# Patient Record
Sex: Female | Born: 1935 | Race: Black or African American | Hispanic: No | Smoking: Former smoker
Health system: Southern US, Community
[De-identification: ages and names within clinical notes are randomized; demographics above are authoritative.]

## PROBLEM LIST (undated history)

## (undated) DIAGNOSIS — IMO0001 Reserved for inherently not codable concepts without codable children: Secondary | ICD-10-CM

## (undated) DIAGNOSIS — Z9289 Personal history of other medical treatment: Secondary | ICD-10-CM

## (undated) DIAGNOSIS — I4821 Permanent atrial fibrillation: Secondary | ICD-10-CM

## (undated) DIAGNOSIS — I272 Pulmonary hypertension, unspecified: Secondary | ICD-10-CM

## (undated) DIAGNOSIS — I4891 Unspecified atrial fibrillation: Secondary | ICD-10-CM

## (undated) DIAGNOSIS — M1990 Unspecified osteoarthritis, unspecified site: Secondary | ICD-10-CM

## (undated) DIAGNOSIS — R001 Bradycardia, unspecified: Secondary | ICD-10-CM

## (undated) DIAGNOSIS — I10 Essential (primary) hypertension: Secondary | ICD-10-CM

## (undated) DIAGNOSIS — Z531 Procedure and treatment not carried out because of patient's decision for reasons of belief and group pressure: Secondary | ICD-10-CM

## (undated) DIAGNOSIS — E669 Obesity, unspecified: Secondary | ICD-10-CM

## (undated) DIAGNOSIS — K219 Gastro-esophageal reflux disease without esophagitis: Secondary | ICD-10-CM

## (undated) DIAGNOSIS — I712 Thoracic aortic aneurysm, without rupture: Secondary | ICD-10-CM

## (undated) DIAGNOSIS — Z7901 Long term (current) use of anticoagulants: Secondary | ICD-10-CM

## (undated) DIAGNOSIS — I5032 Chronic diastolic (congestive) heart failure: Secondary | ICD-10-CM

## (undated) DIAGNOSIS — I7121 Aneurysm of the ascending aorta, without rupture: Secondary | ICD-10-CM

## (undated) HISTORY — PX: KNEE ARTHROSCOPY: SUR90

## (undated) HISTORY — DX: Personal history of other medical treatment: Z92.89

## (undated) HISTORY — PX: ABDOMINAL HYSTERECTOMY: SHX81

## (undated) HISTORY — PX: CHOLECYSTECTOMY: SHX55

## (undated) HISTORY — DX: Reserved for inherently not codable concepts without codable children: IMO0001

## (undated) HISTORY — DX: Obesity, unspecified: E66.9

## (undated) HISTORY — DX: Long term (current) use of anticoagulants: Z79.01

## (undated) HISTORY — DX: Aneurysm of the ascending aorta, without rupture: I71.21

## (undated) HISTORY — DX: Bradycardia, unspecified: R00.1

## (undated) HISTORY — DX: Gastro-esophageal reflux disease without esophagitis: K21.9

## (undated) HISTORY — DX: Procedure and treatment not carried out because of patient's decision for reasons of belief and group pressure: Z53.1

## (undated) HISTORY — DX: Pulmonary hypertension, unspecified: I27.20

## (undated) HISTORY — PX: TUBAL LIGATION: SHX77

## (undated) HISTORY — DX: Chronic diastolic (congestive) heart failure: I50.32

## (undated) HISTORY — DX: Thoracic aortic aneurysm, without rupture: I71.2

## (undated) HISTORY — DX: Permanent atrial fibrillation: I48.21

## (undated) HISTORY — DX: Unspecified osteoarthritis, unspecified site: M19.90

## (undated) HISTORY — PX: CARPAL TUNNEL RELEASE: SHX101

---

## 1978-01-14 HISTORY — PX: IRRIGATION AND DEBRIDEMENT SEBACEOUS CYST: SHX5255

## 1996-06-14 ENCOUNTER — Encounter (INDEPENDENT_AMBULATORY_CARE_PROVIDER_SITE_OTHER): Payer: Self-pay | Admitting: *Deleted

## 1997-04-26 ENCOUNTER — Encounter: Admission: RE | Admit: 1997-04-26 | Discharge: 1997-04-26 | Payer: Self-pay | Admitting: Family Medicine

## 1997-05-02 ENCOUNTER — Encounter: Admission: RE | Admit: 1997-05-02 | Discharge: 1997-05-02 | Payer: Self-pay | Admitting: Family Medicine

## 1997-07-11 ENCOUNTER — Ambulatory Visit (HOSPITAL_COMMUNITY): Admission: RE | Admit: 1997-07-11 | Discharge: 1997-07-11 | Payer: Self-pay | Admitting: Gastroenterology

## 1997-08-17 ENCOUNTER — Encounter: Admission: RE | Admit: 1997-08-17 | Discharge: 1997-08-17 | Payer: Self-pay | Admitting: Family Medicine

## 1997-08-30 ENCOUNTER — Encounter: Admission: RE | Admit: 1997-08-30 | Discharge: 1997-08-30 | Payer: Self-pay | Admitting: Sports Medicine

## 1997-09-07 ENCOUNTER — Encounter: Admission: RE | Admit: 1997-09-07 | Discharge: 1997-09-07 | Payer: Self-pay | Admitting: Family Medicine

## 1997-11-24 ENCOUNTER — Encounter: Admission: RE | Admit: 1997-11-24 | Discharge: 1997-11-24 | Payer: Self-pay | Admitting: Family Medicine

## 1998-03-16 ENCOUNTER — Encounter: Admission: RE | Admit: 1998-03-16 | Discharge: 1998-03-16 | Payer: Self-pay | Admitting: Family Medicine

## 1998-09-27 ENCOUNTER — Encounter: Admission: RE | Admit: 1998-09-27 | Discharge: 1998-09-27 | Payer: Self-pay | Admitting: Family Medicine

## 1998-11-21 ENCOUNTER — Encounter: Admission: RE | Admit: 1998-11-21 | Discharge: 1998-11-21 | Payer: Self-pay | Admitting: Sports Medicine

## 1998-11-21 ENCOUNTER — Encounter: Payer: Self-pay | Admitting: Sports Medicine

## 1998-12-22 ENCOUNTER — Encounter: Admission: RE | Admit: 1998-12-22 | Discharge: 1998-12-22 | Payer: Self-pay | Admitting: Family Medicine

## 1999-01-04 ENCOUNTER — Encounter: Admission: RE | Admit: 1999-01-04 | Discharge: 1999-01-04 | Payer: Self-pay | Admitting: Sports Medicine

## 1999-06-11 ENCOUNTER — Encounter: Admission: RE | Admit: 1999-06-11 | Discharge: 1999-06-11 | Payer: Self-pay

## 1999-07-04 ENCOUNTER — Encounter: Admission: RE | Admit: 1999-07-04 | Discharge: 1999-07-04 | Payer: Self-pay | Admitting: Family Medicine

## 1999-08-20 ENCOUNTER — Encounter: Admission: RE | Admit: 1999-08-20 | Discharge: 1999-08-20 | Payer: Self-pay | Admitting: Family Medicine

## 1999-08-27 ENCOUNTER — Encounter (INDEPENDENT_AMBULATORY_CARE_PROVIDER_SITE_OTHER): Payer: Self-pay | Admitting: *Deleted

## 1999-08-27 ENCOUNTER — Ambulatory Visit (HOSPITAL_BASED_OUTPATIENT_CLINIC_OR_DEPARTMENT_OTHER): Admission: RE | Admit: 1999-08-27 | Discharge: 1999-08-27 | Payer: Self-pay | Admitting: Orthopedic Surgery

## 1999-11-15 ENCOUNTER — Encounter: Admission: RE | Admit: 1999-11-15 | Discharge: 1999-11-15 | Payer: Self-pay | Admitting: Family Medicine

## 1999-12-04 ENCOUNTER — Encounter: Admission: RE | Admit: 1999-12-04 | Discharge: 1999-12-04 | Payer: Self-pay | Admitting: *Deleted

## 1999-12-04 ENCOUNTER — Encounter: Payer: Self-pay | Admitting: Family Medicine

## 1999-12-18 ENCOUNTER — Encounter: Admission: RE | Admit: 1999-12-18 | Discharge: 2000-03-17 | Payer: Self-pay | Admitting: *Deleted

## 2000-01-22 ENCOUNTER — Encounter: Admission: RE | Admit: 2000-01-22 | Discharge: 2000-01-22 | Payer: Self-pay | Admitting: Family Medicine

## 2000-02-28 ENCOUNTER — Encounter: Admission: RE | Admit: 2000-02-28 | Discharge: 2000-02-28 | Payer: Self-pay | Admitting: Family Medicine

## 2000-02-28 ENCOUNTER — Ambulatory Visit (HOSPITAL_COMMUNITY): Admission: RE | Admit: 2000-02-28 | Discharge: 2000-02-28 | Payer: Self-pay | Admitting: Family Medicine

## 2000-03-03 ENCOUNTER — Encounter: Admission: RE | Admit: 2000-03-03 | Discharge: 2000-03-03 | Payer: Self-pay | Admitting: Family Medicine

## 2000-04-23 ENCOUNTER — Encounter: Admission: RE | Admit: 2000-04-23 | Discharge: 2000-04-23 | Payer: Self-pay | Admitting: Family Medicine

## 2000-05-14 ENCOUNTER — Encounter: Admission: RE | Admit: 2000-05-14 | Discharge: 2000-05-14 | Payer: Self-pay | Admitting: Cardiology

## 2000-05-14 ENCOUNTER — Encounter: Payer: Self-pay | Admitting: Cardiology

## 2000-05-30 ENCOUNTER — Ambulatory Visit (HOSPITAL_COMMUNITY): Admission: RE | Admit: 2000-05-30 | Discharge: 2000-05-30 | Payer: Self-pay | Admitting: Family Medicine

## 2000-05-30 ENCOUNTER — Encounter: Admission: RE | Admit: 2000-05-30 | Discharge: 2000-05-30 | Payer: Self-pay | Admitting: Family Medicine

## 2000-06-11 ENCOUNTER — Encounter: Admission: RE | Admit: 2000-06-11 | Discharge: 2000-06-11 | Payer: Self-pay | Admitting: Family Medicine

## 2000-06-25 ENCOUNTER — Encounter: Admission: RE | Admit: 2000-06-25 | Discharge: 2000-06-25 | Payer: Self-pay | Admitting: Family Medicine

## 2000-07-01 ENCOUNTER — Encounter: Admission: RE | Admit: 2000-07-01 | Discharge: 2000-07-01 | Payer: Self-pay | Admitting: Family Medicine

## 2000-07-10 ENCOUNTER — Encounter: Admission: RE | Admit: 2000-07-10 | Discharge: 2000-07-10 | Payer: Self-pay | Admitting: Family Medicine

## 2000-08-11 ENCOUNTER — Encounter: Admission: RE | Admit: 2000-08-11 | Discharge: 2000-08-11 | Payer: Self-pay | Admitting: Family Medicine

## 2000-09-09 ENCOUNTER — Encounter: Admission: RE | Admit: 2000-09-09 | Discharge: 2000-09-09 | Payer: Self-pay | Admitting: Family Medicine

## 2000-10-09 ENCOUNTER — Encounter: Admission: RE | Admit: 2000-10-09 | Discharge: 2000-10-09 | Payer: Self-pay | Admitting: Family Medicine

## 2000-10-09 ENCOUNTER — Encounter: Admission: RE | Admit: 2000-10-09 | Discharge: 2000-10-09 | Payer: Self-pay | Admitting: Sports Medicine

## 2000-11-10 ENCOUNTER — Encounter: Admission: RE | Admit: 2000-11-10 | Discharge: 2000-11-10 | Payer: Self-pay | Admitting: Family Medicine

## 2000-12-04 ENCOUNTER — Encounter: Admission: RE | Admit: 2000-12-04 | Discharge: 2000-12-04 | Payer: Self-pay | Admitting: Sports Medicine

## 2000-12-04 ENCOUNTER — Encounter: Payer: Self-pay | Admitting: Sports Medicine

## 2000-12-10 ENCOUNTER — Encounter: Admission: RE | Admit: 2000-12-10 | Discharge: 2000-12-10 | Payer: Self-pay | Admitting: Family Medicine

## 2000-12-24 ENCOUNTER — Encounter: Admission: RE | Admit: 2000-12-24 | Discharge: 2000-12-24 | Payer: Self-pay | Admitting: Family Medicine

## 2001-02-24 ENCOUNTER — Encounter: Admission: RE | Admit: 2001-02-24 | Discharge: 2001-02-24 | Payer: Self-pay | Admitting: Sports Medicine

## 2001-05-25 ENCOUNTER — Encounter: Admission: RE | Admit: 2001-05-25 | Discharge: 2001-05-25 | Payer: Self-pay | Admitting: Family Medicine

## 2001-06-25 ENCOUNTER — Encounter: Admission: RE | Admit: 2001-06-25 | Discharge: 2001-06-25 | Payer: Self-pay | Admitting: Family Medicine

## 2001-09-17 ENCOUNTER — Encounter: Admission: RE | Admit: 2001-09-17 | Discharge: 2001-09-17 | Payer: Self-pay | Admitting: Family Medicine

## 2001-09-28 ENCOUNTER — Encounter: Admission: RE | Admit: 2001-09-28 | Discharge: 2001-09-28 | Payer: Self-pay | Admitting: Family Medicine

## 2001-10-13 ENCOUNTER — Encounter: Admission: RE | Admit: 2001-10-13 | Discharge: 2001-10-13 | Payer: Self-pay | Admitting: Family Medicine

## 2001-10-30 ENCOUNTER — Encounter: Admission: RE | Admit: 2001-10-30 | Discharge: 2001-10-30 | Payer: Self-pay | Admitting: *Deleted

## 2001-11-12 ENCOUNTER — Encounter: Admission: RE | Admit: 2001-11-12 | Discharge: 2001-11-12 | Payer: Self-pay | Admitting: Family Medicine

## 2001-12-07 ENCOUNTER — Ambulatory Visit (HOSPITAL_BASED_OUTPATIENT_CLINIC_OR_DEPARTMENT_OTHER): Admission: RE | Admit: 2001-12-07 | Discharge: 2001-12-07 | Payer: Self-pay | Admitting: Orthopedic Surgery

## 2002-01-12 ENCOUNTER — Encounter: Payer: Self-pay | Admitting: Sports Medicine

## 2002-01-12 ENCOUNTER — Encounter: Admission: RE | Admit: 2002-01-12 | Discharge: 2002-01-12 | Payer: Self-pay | Admitting: Sports Medicine

## 2002-01-28 ENCOUNTER — Encounter: Admission: RE | Admit: 2002-01-28 | Discharge: 2002-01-28 | Payer: Self-pay | Admitting: Family Medicine

## 2002-03-10 ENCOUNTER — Encounter: Admission: RE | Admit: 2002-03-10 | Discharge: 2002-03-10 | Payer: Self-pay | Admitting: Family Medicine

## 2002-05-24 ENCOUNTER — Encounter: Admission: RE | Admit: 2002-05-24 | Discharge: 2002-05-24 | Payer: Self-pay | Admitting: Family Medicine

## 2002-06-28 ENCOUNTER — Encounter: Admission: RE | Admit: 2002-06-28 | Discharge: 2002-06-28 | Payer: Self-pay | Admitting: Family Medicine

## 2002-07-14 ENCOUNTER — Encounter (INDEPENDENT_AMBULATORY_CARE_PROVIDER_SITE_OTHER): Payer: Self-pay | Admitting: Specialist

## 2002-07-14 ENCOUNTER — Ambulatory Visit (HOSPITAL_COMMUNITY): Admission: RE | Admit: 2002-07-14 | Discharge: 2002-07-14 | Payer: Self-pay | Admitting: Gastroenterology

## 2002-07-20 ENCOUNTER — Ambulatory Visit (HOSPITAL_COMMUNITY): Admission: RE | Admit: 2002-07-20 | Discharge: 2002-07-20 | Payer: Self-pay | Admitting: Family Medicine

## 2002-07-20 ENCOUNTER — Encounter (INDEPENDENT_AMBULATORY_CARE_PROVIDER_SITE_OTHER): Payer: Self-pay | Admitting: Cardiology

## 2002-08-09 ENCOUNTER — Encounter: Admission: RE | Admit: 2002-08-09 | Discharge: 2002-08-09 | Payer: Self-pay | Admitting: Family Medicine

## 2002-09-23 ENCOUNTER — Encounter: Admission: RE | Admit: 2002-09-23 | Discharge: 2002-09-23 | Payer: Self-pay | Admitting: Sports Medicine

## 2002-10-06 ENCOUNTER — Encounter: Admission: RE | Admit: 2002-10-06 | Discharge: 2002-12-15 | Payer: Self-pay

## 2002-11-03 ENCOUNTER — Encounter: Admission: RE | Admit: 2002-11-03 | Discharge: 2002-11-03 | Payer: Self-pay | Admitting: Family Medicine

## 2002-11-30 ENCOUNTER — Encounter: Admission: RE | Admit: 2002-11-30 | Discharge: 2002-11-30 | Payer: Self-pay | Admitting: Family Medicine

## 2003-02-11 ENCOUNTER — Ambulatory Visit (HOSPITAL_COMMUNITY): Admission: RE | Admit: 2003-02-11 | Discharge: 2003-02-11 | Payer: Self-pay | Admitting: Sports Medicine

## 2003-03-04 ENCOUNTER — Encounter: Admission: RE | Admit: 2003-03-04 | Discharge: 2003-03-04 | Payer: Self-pay | Admitting: Sports Medicine

## 2003-03-04 ENCOUNTER — Encounter: Admission: RE | Admit: 2003-03-04 | Discharge: 2003-03-04 | Payer: Self-pay | Admitting: Family Medicine

## 2003-03-09 ENCOUNTER — Encounter: Admission: RE | Admit: 2003-03-09 | Discharge: 2003-03-09 | Payer: Self-pay | Admitting: Family Medicine

## 2003-04-01 ENCOUNTER — Encounter: Admission: RE | Admit: 2003-04-01 | Discharge: 2003-04-01 | Payer: Self-pay | Admitting: Sports Medicine

## 2003-05-20 ENCOUNTER — Encounter: Admission: RE | Admit: 2003-05-20 | Discharge: 2003-05-20 | Payer: Self-pay | Admitting: Family Medicine

## 2003-06-28 ENCOUNTER — Encounter: Admission: RE | Admit: 2003-06-28 | Discharge: 2003-06-28 | Payer: Self-pay | Admitting: Family Medicine

## 2003-08-29 ENCOUNTER — Encounter: Admission: RE | Admit: 2003-08-29 | Discharge: 2003-08-29 | Payer: Self-pay | Admitting: Family Medicine

## 2003-09-29 ENCOUNTER — Ambulatory Visit: Payer: Self-pay | Admitting: Family Medicine

## 2003-10-17 ENCOUNTER — Ambulatory Visit: Payer: Self-pay | Admitting: Family Medicine

## 2003-11-22 ENCOUNTER — Ambulatory Visit: Payer: Self-pay | Admitting: Family Medicine

## 2003-11-22 ENCOUNTER — Ambulatory Visit: Payer: Self-pay | Admitting: Cardiology

## 2003-12-06 ENCOUNTER — Ambulatory Visit: Payer: Self-pay | Admitting: Internal Medicine

## 2003-12-20 ENCOUNTER — Ambulatory Visit: Payer: Self-pay | Admitting: Cardiology

## 2004-01-10 ENCOUNTER — Ambulatory Visit: Payer: Self-pay | Admitting: Cardiology

## 2004-02-01 ENCOUNTER — Ambulatory Visit: Payer: Self-pay | Admitting: Family Medicine

## 2004-02-07 ENCOUNTER — Ambulatory Visit: Payer: Self-pay | Admitting: Cardiology

## 2004-02-13 ENCOUNTER — Ambulatory Visit: Payer: Self-pay | Admitting: Sports Medicine

## 2004-02-17 ENCOUNTER — Encounter: Admission: RE | Admit: 2004-02-17 | Discharge: 2004-02-17 | Payer: Self-pay | Admitting: Sports Medicine

## 2004-02-21 ENCOUNTER — Ambulatory Visit: Payer: Self-pay | Admitting: Cardiology

## 2004-03-05 ENCOUNTER — Ambulatory Visit: Payer: Self-pay | Admitting: *Deleted

## 2004-03-05 ENCOUNTER — Encounter: Admission: RE | Admit: 2004-03-05 | Discharge: 2004-03-05 | Payer: Self-pay | Admitting: Sports Medicine

## 2004-03-05 ENCOUNTER — Ambulatory Visit: Payer: Self-pay | Admitting: Family Medicine

## 2004-03-26 ENCOUNTER — Ambulatory Visit: Payer: Self-pay | Admitting: Cardiology

## 2004-04-09 ENCOUNTER — Ambulatory Visit: Payer: Self-pay | Admitting: Cardiology

## 2004-04-30 ENCOUNTER — Ambulatory Visit: Payer: Self-pay | Admitting: *Deleted

## 2004-05-17 ENCOUNTER — Ambulatory Visit: Payer: Self-pay | Admitting: Family Medicine

## 2004-05-28 ENCOUNTER — Ambulatory Visit: Payer: Self-pay | Admitting: Cardiology

## 2004-06-08 ENCOUNTER — Ambulatory Visit: Payer: Self-pay | Admitting: Cardiology

## 2004-07-02 ENCOUNTER — Ambulatory Visit: Payer: Self-pay | Admitting: Cardiology

## 2004-07-10 ENCOUNTER — Ambulatory Visit: Payer: Self-pay | Admitting: Sports Medicine

## 2004-07-16 ENCOUNTER — Ambulatory Visit: Payer: Self-pay | Admitting: Cardiology

## 2004-08-06 ENCOUNTER — Ambulatory Visit: Payer: Self-pay | Admitting: Internal Medicine

## 2004-08-07 ENCOUNTER — Ambulatory Visit: Payer: Self-pay | Admitting: Sports Medicine

## 2004-09-03 ENCOUNTER — Ambulatory Visit: Payer: Self-pay | Admitting: Cardiology

## 2004-09-18 ENCOUNTER — Ambulatory Visit: Payer: Self-pay | Admitting: Cardiology

## 2004-09-27 ENCOUNTER — Ambulatory Visit: Payer: Self-pay | Admitting: Internal Medicine

## 2004-09-27 ENCOUNTER — Ambulatory Visit: Payer: Self-pay | Admitting: Cardiology

## 2004-10-16 ENCOUNTER — Ambulatory Visit: Payer: Self-pay | Admitting: Family Medicine

## 2004-10-19 ENCOUNTER — Ambulatory Visit: Payer: Self-pay | Admitting: Cardiology

## 2004-11-02 ENCOUNTER — Ambulatory Visit: Payer: Self-pay | Admitting: Cardiology

## 2004-11-12 ENCOUNTER — Ambulatory Visit: Payer: Self-pay | Admitting: Sports Medicine

## 2004-11-23 ENCOUNTER — Ambulatory Visit: Payer: Self-pay | Admitting: Cardiology

## 2004-12-03 ENCOUNTER — Ambulatory Visit: Payer: Self-pay | Admitting: Family Medicine

## 2004-12-14 ENCOUNTER — Ambulatory Visit: Payer: Self-pay | Admitting: Internal Medicine

## 2004-12-24 ENCOUNTER — Ambulatory Visit: Payer: Self-pay | Admitting: Family Medicine

## 2005-01-11 ENCOUNTER — Ambulatory Visit: Payer: Self-pay | Admitting: Cardiology

## 2005-01-25 ENCOUNTER — Ambulatory Visit: Payer: Self-pay | Admitting: Internal Medicine

## 2005-02-15 ENCOUNTER — Ambulatory Visit: Payer: Self-pay | Admitting: Cardiology

## 2005-02-15 ENCOUNTER — Ambulatory Visit: Payer: Self-pay | Admitting: Family Medicine

## 2005-02-27 ENCOUNTER — Ambulatory Visit: Payer: Self-pay | Admitting: Family Medicine

## 2005-03-15 ENCOUNTER — Ambulatory Visit: Payer: Self-pay | Admitting: Cardiology

## 2005-03-19 ENCOUNTER — Encounter: Admission: RE | Admit: 2005-03-19 | Discharge: 2005-03-19 | Payer: Self-pay | Admitting: Sports Medicine

## 2005-04-12 ENCOUNTER — Ambulatory Visit: Payer: Self-pay | Admitting: Cardiology

## 2005-04-26 ENCOUNTER — Ambulatory Visit: Payer: Self-pay | Admitting: Family Medicine

## 2005-05-10 ENCOUNTER — Ambulatory Visit: Payer: Self-pay | Admitting: Cardiology

## 2005-06-07 ENCOUNTER — Ambulatory Visit: Payer: Self-pay | Admitting: Cardiology

## 2005-06-24 ENCOUNTER — Ambulatory Visit: Payer: Self-pay | Admitting: Family Medicine

## 2005-06-24 ENCOUNTER — Encounter: Admission: RE | Admit: 2005-06-24 | Discharge: 2005-06-24 | Payer: Self-pay | Admitting: Family Medicine

## 2005-07-05 ENCOUNTER — Ambulatory Visit: Payer: Self-pay | Admitting: Cardiology

## 2005-07-25 ENCOUNTER — Ambulatory Visit: Payer: Self-pay | Admitting: Family Medicine

## 2005-08-02 ENCOUNTER — Ambulatory Visit: Payer: Self-pay | Admitting: Cardiology

## 2005-08-30 ENCOUNTER — Ambulatory Visit: Payer: Self-pay | Admitting: Cardiology

## 2005-09-27 ENCOUNTER — Ambulatory Visit: Payer: Self-pay | Admitting: Cardiology

## 2005-10-18 ENCOUNTER — Ambulatory Visit: Payer: Self-pay | Admitting: Family Medicine

## 2005-10-25 ENCOUNTER — Ambulatory Visit: Payer: Self-pay | Admitting: Cardiology

## 2005-11-06 ENCOUNTER — Ambulatory Visit: Payer: Self-pay | Admitting: Cardiology

## 2005-11-15 ENCOUNTER — Ambulatory Visit: Payer: Self-pay | Admitting: Cardiology

## 2005-11-20 ENCOUNTER — Ambulatory Visit: Payer: Self-pay | Admitting: Family Medicine

## 2005-12-13 ENCOUNTER — Ambulatory Visit: Payer: Self-pay | Admitting: Cardiology

## 2005-12-20 ENCOUNTER — Ambulatory Visit: Payer: Self-pay | Admitting: Family Medicine

## 2005-12-30 ENCOUNTER — Ambulatory Visit: Payer: Self-pay | Admitting: Family Medicine

## 2006-01-10 ENCOUNTER — Ambulatory Visit: Payer: Self-pay | Admitting: Cardiology

## 2006-01-27 ENCOUNTER — Ambulatory Visit: Payer: Self-pay | Admitting: Family Medicine

## 2006-02-07 ENCOUNTER — Ambulatory Visit: Payer: Self-pay | Admitting: Cardiology

## 2006-02-10 ENCOUNTER — Ambulatory Visit: Payer: Self-pay | Admitting: Family Medicine

## 2006-02-28 ENCOUNTER — Ambulatory Visit: Payer: Self-pay | Admitting: Internal Medicine

## 2006-03-05 ENCOUNTER — Ambulatory Visit: Payer: Self-pay | Admitting: Cardiology

## 2006-03-14 ENCOUNTER — Encounter (INDEPENDENT_AMBULATORY_CARE_PROVIDER_SITE_OTHER): Payer: Self-pay | Admitting: *Deleted

## 2006-03-24 ENCOUNTER — Encounter (INDEPENDENT_AMBULATORY_CARE_PROVIDER_SITE_OTHER): Payer: Self-pay | Admitting: Family Medicine

## 2006-03-24 ENCOUNTER — Encounter: Admission: RE | Admit: 2006-03-24 | Discharge: 2006-03-24 | Payer: Self-pay | Admitting: Sports Medicine

## 2006-03-31 ENCOUNTER — Ambulatory Visit: Payer: Self-pay | Admitting: Internal Medicine

## 2006-04-23 ENCOUNTER — Ambulatory Visit: Payer: Self-pay | Admitting: Family Medicine

## 2006-04-23 DIAGNOSIS — I10 Essential (primary) hypertension: Secondary | ICD-10-CM | POA: Insufficient documentation

## 2006-04-23 LAB — CONVERTED CEMR LAB: Hgb A1c MFr Bld: 5.6 %

## 2006-04-28 ENCOUNTER — Ambulatory Visit: Payer: Self-pay | Admitting: Cardiology

## 2006-05-26 ENCOUNTER — Ambulatory Visit: Payer: Self-pay | Admitting: Internal Medicine

## 2006-06-11 ENCOUNTER — Ambulatory Visit: Payer: Self-pay | Admitting: Cardiology

## 2006-07-10 ENCOUNTER — Encounter (INDEPENDENT_AMBULATORY_CARE_PROVIDER_SITE_OTHER): Payer: Self-pay | Admitting: Family Medicine

## 2006-07-17 ENCOUNTER — Ambulatory Visit: Payer: Self-pay | Admitting: Family Medicine

## 2006-07-17 ENCOUNTER — Encounter (INDEPENDENT_AMBULATORY_CARE_PROVIDER_SITE_OTHER): Payer: Self-pay | Admitting: Family Medicine

## 2006-07-24 ENCOUNTER — Ambulatory Visit: Payer: Self-pay | Admitting: Family Medicine

## 2006-07-24 ENCOUNTER — Ambulatory Visit: Payer: Self-pay | Admitting: Internal Medicine

## 2006-07-28 LAB — CONVERTED CEMR LAB
AST: 20 units/L (ref 0–37)
Alkaline Phosphatase: 126 units/L — ABNORMAL HIGH (ref 39–117)
BUN: 11 mg/dL (ref 6–23)
CO2: 24 meq/L (ref 19–32)
Chloride: 105 meq/L (ref 96–112)
Creatinine, Ser: 0.58 mg/dL (ref 0.40–1.20)
Glucose, Bld: 104 mg/dL — ABNORMAL HIGH (ref 70–99)
HDL: 40 mg/dL (ref >39)
Potassium: 4 meq/L (ref 3.5–5.3)
Sodium: 142 meq/L (ref 135–145)
Total Bilirubin: 0.7 mg/dL (ref 0.3–1.2)

## 2006-08-05 ENCOUNTER — Encounter (INDEPENDENT_AMBULATORY_CARE_PROVIDER_SITE_OTHER): Payer: Self-pay | Admitting: Family Medicine

## 2006-08-05 ENCOUNTER — Ambulatory Visit: Payer: Self-pay

## 2006-08-11 ENCOUNTER — Emergency Department (HOSPITAL_COMMUNITY): Admission: EM | Admit: 2006-08-11 | Discharge: 2006-08-11 | Payer: Self-pay | Admitting: *Deleted

## 2006-08-21 ENCOUNTER — Ambulatory Visit: Payer: Self-pay | Admitting: Internal Medicine

## 2006-08-25 ENCOUNTER — Ambulatory Visit: Payer: Self-pay | Admitting: Sports Medicine

## 2006-09-12 ENCOUNTER — Ambulatory Visit: Payer: Self-pay | Admitting: Cardiology

## 2006-09-17 ENCOUNTER — Encounter: Payer: Self-pay | Admitting: *Deleted

## 2006-09-18 ENCOUNTER — Ambulatory Visit: Payer: Self-pay | Admitting: Cardiology

## 2006-10-08 ENCOUNTER — Ambulatory Visit: Payer: Self-pay | Admitting: Family Medicine

## 2006-10-16 ENCOUNTER — Ambulatory Visit: Payer: Self-pay | Admitting: Cardiology

## 2006-10-24 ENCOUNTER — Ambulatory Visit: Payer: Self-pay | Admitting: Cardiology

## 2006-11-06 ENCOUNTER — Ambulatory Visit: Payer: Self-pay | Admitting: Cardiovascular Disease

## 2006-11-20 ENCOUNTER — Ambulatory Visit: Payer: Self-pay | Admitting: Cardiology

## 2006-12-08 ENCOUNTER — Ambulatory Visit: Payer: Self-pay | Admitting: Family Medicine

## 2006-12-08 DIAGNOSIS — I5032 Chronic diastolic (congestive) heart failure: Secondary | ICD-10-CM | POA: Insufficient documentation

## 2006-12-08 DIAGNOSIS — I4891 Unspecified atrial fibrillation: Secondary | ICD-10-CM | POA: Insufficient documentation

## 2006-12-09 ENCOUNTER — Encounter (INDEPENDENT_AMBULATORY_CARE_PROVIDER_SITE_OTHER): Payer: Self-pay | Admitting: Family Medicine

## 2006-12-09 ENCOUNTER — Telehealth: Payer: Self-pay | Admitting: *Deleted

## 2006-12-09 ENCOUNTER — Encounter (INDEPENDENT_AMBULATORY_CARE_PROVIDER_SITE_OTHER): Payer: Self-pay | Admitting: *Deleted

## 2006-12-09 ENCOUNTER — Ambulatory Visit: Payer: Self-pay | Admitting: Family Medicine

## 2006-12-09 LAB — CONVERTED CEMR LAB
Glucose, Bld: 108 mg/dL — ABNORMAL HIGH (ref 70–99)
Hgb A1c MFr Bld: 5.8 %

## 2006-12-16 ENCOUNTER — Telehealth (INDEPENDENT_AMBULATORY_CARE_PROVIDER_SITE_OTHER): Payer: Self-pay | Admitting: Family Medicine

## 2006-12-18 ENCOUNTER — Ambulatory Visit: Payer: Self-pay | Admitting: Cardiology

## 2006-12-19 ENCOUNTER — Telehealth: Payer: Self-pay | Admitting: *Deleted

## 2006-12-25 ENCOUNTER — Encounter: Payer: Self-pay | Admitting: Family Medicine

## 2007-01-05 ENCOUNTER — Ambulatory Visit: Payer: Self-pay | Admitting: Cardiology

## 2007-02-02 ENCOUNTER — Ambulatory Visit: Payer: Self-pay | Admitting: Cardiovascular Disease

## 2007-02-27 ENCOUNTER — Ambulatory Visit: Payer: Self-pay | Admitting: Family Medicine

## 2007-03-02 ENCOUNTER — Ambulatory Visit: Payer: Self-pay | Admitting: Internal Medicine

## 2007-03-26 ENCOUNTER — Ambulatory Visit: Payer: Self-pay | Admitting: Cardiology

## 2007-03-26 ENCOUNTER — Encounter: Admission: RE | Admit: 2007-03-26 | Discharge: 2007-03-26 | Payer: Self-pay | Admitting: Family Medicine

## 2007-04-06 ENCOUNTER — Ambulatory Visit: Payer: Self-pay | Admitting: Family Medicine

## 2007-04-06 DIAGNOSIS — D126 Benign neoplasm of colon, unspecified: Secondary | ICD-10-CM | POA: Insufficient documentation

## 2007-04-23 ENCOUNTER — Ambulatory Visit: Payer: Self-pay | Admitting: Internal Medicine

## 2007-04-29 ENCOUNTER — Ambulatory Visit: Payer: Self-pay | Admitting: Family Medicine

## 2007-06-01 ENCOUNTER — Ambulatory Visit: Payer: Self-pay | Admitting: Cardiology

## 2007-06-04 ENCOUNTER — Ambulatory Visit: Payer: Self-pay | Admitting: Family Medicine

## 2007-06-04 DIAGNOSIS — F3289 Other specified depressive episodes: Secondary | ICD-10-CM | POA: Insufficient documentation

## 2007-06-04 DIAGNOSIS — F329 Major depressive disorder, single episode, unspecified: Secondary | ICD-10-CM | POA: Insufficient documentation

## 2007-06-04 DIAGNOSIS — E669 Obesity, unspecified: Secondary | ICD-10-CM | POA: Insufficient documentation

## 2007-06-29 ENCOUNTER — Ambulatory Visit: Payer: Self-pay | Admitting: Cardiology

## 2007-07-08 ENCOUNTER — Encounter: Payer: Self-pay | Admitting: Family Medicine

## 2007-07-29 ENCOUNTER — Ambulatory Visit: Payer: Self-pay | Admitting: Cardiology

## 2007-07-29 ENCOUNTER — Ambulatory Visit: Payer: Self-pay | Admitting: Cardiovascular Disease

## 2007-08-10 ENCOUNTER — Ambulatory Visit: Payer: Self-pay | Admitting: Cardiology

## 2007-08-10 LAB — CONVERTED CEMR LAB
AST: 32 units/L (ref 0–37)
Albumin: 4.1 g/dL (ref 3.5–5.2)
Alkaline Phosphatase: 97 units/L (ref 39–117)
BUN: 9 mg/dL (ref 6–23)
Creatinine, Ser: 0.7 mg/dL (ref 0.4–1.2)
GFR calc Af Amer: 105 mL/min
GFR calc non Af Amer: 87 mL/min
Glucose, Bld: 108 mg/dL — ABNORMAL HIGH (ref 70–99)
Potassium: 3.7 meq/L (ref 3.5–5.1)
Total Protein: 7 g/dL (ref 6.0–8.3)

## 2007-08-26 ENCOUNTER — Ambulatory Visit: Payer: Self-pay | Admitting: Cardiovascular Disease

## 2007-09-22 ENCOUNTER — Ambulatory Visit: Payer: Self-pay | Admitting: Cardiology

## 2007-09-23 ENCOUNTER — Ambulatory Visit: Payer: Self-pay | Admitting: Cardiology

## 2007-10-13 ENCOUNTER — Ambulatory Visit: Payer: Self-pay | Admitting: Internal Medicine

## 2007-10-28 ENCOUNTER — Ambulatory Visit: Payer: Self-pay | Admitting: Family Medicine

## 2007-10-28 DIAGNOSIS — M170 Bilateral primary osteoarthritis of knee: Secondary | ICD-10-CM | POA: Insufficient documentation

## 2007-10-28 LAB — CONVERTED CEMR LAB

## 2007-10-29 ENCOUNTER — Encounter: Payer: Self-pay | Admitting: Family Medicine

## 2007-11-12 ENCOUNTER — Ambulatory Visit: Payer: Self-pay | Admitting: Cardiovascular Disease

## 2007-12-07 ENCOUNTER — Ambulatory Visit: Payer: Self-pay | Admitting: Internal Medicine

## 2007-12-22 ENCOUNTER — Ambulatory Visit: Payer: Self-pay | Admitting: Cardiology

## 2007-12-28 ENCOUNTER — Ambulatory Visit: Payer: Self-pay | Admitting: Internal Medicine

## 2008-01-18 ENCOUNTER — Ambulatory Visit: Payer: Self-pay | Admitting: Internal Medicine

## 2008-02-12 ENCOUNTER — Ambulatory Visit: Payer: Self-pay | Admitting: Internal Medicine

## 2008-02-16 ENCOUNTER — Encounter: Payer: Self-pay | Admitting: Family Medicine

## 2008-02-16 ENCOUNTER — Ambulatory Visit: Payer: Self-pay | Admitting: Cardiology

## 2008-02-23 ENCOUNTER — Encounter: Payer: Self-pay | Admitting: Cardiology

## 2008-02-23 ENCOUNTER — Ambulatory Visit: Payer: Self-pay

## 2008-02-25 ENCOUNTER — Telehealth: Payer: Self-pay | Admitting: Family Medicine

## 2008-02-26 ENCOUNTER — Ambulatory Visit: Payer: Self-pay | Admitting: Hematology and Oncology

## 2008-03-03 ENCOUNTER — Encounter: Payer: Self-pay | Admitting: Family Medicine

## 2008-03-03 LAB — CBC WITH DIFFERENTIAL/PLATELET
Basophils Absolute: 0 10*3/uL (ref 0.0–0.1)
EOS%: 1.1 % (ref 0.0–7.0)
HCT: 42 % (ref 34.8–46.6)
HGB: 13.7 g/dL (ref 11.6–15.9)
LYMPH%: 36 % (ref 14.0–48.0)
MCH: 28.1 pg (ref 26.0–34.0)
MCV: 86.2 fL (ref 81.0–101.0)
NEUT%: 58.1 % (ref 39.6–76.8)
Platelets: 189 10*3/uL (ref 145–400)
lymph#: 2.4 10*3/uL (ref 0.9–3.3)

## 2008-03-04 ENCOUNTER — Telehealth: Payer: Self-pay | Admitting: *Deleted

## 2008-03-07 ENCOUNTER — Ambulatory Visit: Payer: Self-pay | Admitting: Family Medicine

## 2008-03-07 ENCOUNTER — Encounter: Payer: Self-pay | Admitting: Family Medicine

## 2008-03-07 LAB — CONVERTED CEMR LAB: Hgb A1c MFr Bld: 6 %

## 2008-03-09 LAB — CONVERTED CEMR LAB
BUN: 17 mg/dL (ref 6–23)
Cholesterol: 176 mg/dL (ref 0–200)
Creatinine, Ser: 0.67 mg/dL (ref 0.40–1.20)
HCT: 41.1 % (ref 36.0–46.0)
HDL: 51 mg/dL (ref >39)
Hemoglobin: 13 g/dL (ref 12.0–15.0)
MCHC: 31.6 g/dL (ref 30.0–36.0)
Platelets: 198 10*3/uL (ref 150–400)
RDW: 16.3 % — ABNORMAL HIGH (ref 11.5–15.5)
Triglycerides: 88 mg/dL (ref <150)

## 2008-03-11 ENCOUNTER — Ambulatory Visit: Payer: Self-pay | Admitting: Cardiology

## 2008-03-28 ENCOUNTER — Ambulatory Visit: Payer: Self-pay | Admitting: Cardiology

## 2008-03-28 ENCOUNTER — Encounter: Payer: Self-pay | Admitting: Cardiology

## 2008-04-04 ENCOUNTER — Ambulatory Visit: Payer: Self-pay | Admitting: Cardiology

## 2008-04-06 ENCOUNTER — Encounter: Payer: Self-pay | Admitting: Family Medicine

## 2008-04-15 ENCOUNTER — Encounter: Admission: RE | Admit: 2008-04-15 | Discharge: 2008-04-15 | Payer: Self-pay | Admitting: Family Medicine

## 2008-04-26 ENCOUNTER — Ambulatory Visit: Payer: Self-pay | Admitting: Cardiology

## 2008-05-17 ENCOUNTER — Ambulatory Visit: Payer: Self-pay | Admitting: Cardiology

## 2008-06-14 ENCOUNTER — Ambulatory Visit: Payer: Self-pay | Admitting: Cardiovascular Disease

## 2008-06-15 ENCOUNTER — Encounter: Payer: Self-pay | Admitting: *Deleted

## 2008-06-28 ENCOUNTER — Encounter (INDEPENDENT_AMBULATORY_CARE_PROVIDER_SITE_OTHER): Payer: Self-pay | Admitting: Cardiology

## 2008-06-28 ENCOUNTER — Ambulatory Visit: Payer: Self-pay | Admitting: Internal Medicine

## 2008-06-28 LAB — CONVERTED CEMR LAB
POC INR: 2.1
Protime: 17.9

## 2008-07-06 ENCOUNTER — Encounter: Payer: Self-pay | Admitting: Family Medicine

## 2008-07-06 ENCOUNTER — Ambulatory Visit: Payer: Self-pay | Admitting: Family Medicine

## 2008-07-06 DIAGNOSIS — M719 Bursopathy, unspecified: Secondary | ICD-10-CM

## 2008-07-06 DIAGNOSIS — M67919 Unspecified disorder of synovium and tendon, unspecified shoulder: Secondary | ICD-10-CM | POA: Insufficient documentation

## 2008-07-20 ENCOUNTER — Encounter: Payer: Self-pay | Admitting: *Deleted

## 2008-07-26 ENCOUNTER — Encounter (INDEPENDENT_AMBULATORY_CARE_PROVIDER_SITE_OTHER): Payer: Self-pay | Admitting: Cardiology

## 2008-07-26 ENCOUNTER — Ambulatory Visit: Payer: Self-pay | Admitting: Cardiovascular Disease

## 2008-07-26 LAB — CONVERTED CEMR LAB
POC INR: 3.1
Prothrombin Time: 21.4 s

## 2008-08-02 ENCOUNTER — Ambulatory Visit: Payer: Self-pay | Admitting: Cardiology

## 2008-08-16 ENCOUNTER — Ambulatory Visit: Payer: Self-pay | Admitting: Cardiovascular Disease

## 2008-08-16 LAB — CONVERTED CEMR LAB
POC INR: 2.3
Prothrombin Time: 18.4 s

## 2008-09-08 ENCOUNTER — Ambulatory Visit: Payer: Self-pay | Admitting: Family Medicine

## 2008-09-08 ENCOUNTER — Encounter: Payer: Self-pay | Admitting: Family Medicine

## 2008-09-08 DIAGNOSIS — M19019 Primary osteoarthritis, unspecified shoulder: Secondary | ICD-10-CM | POA: Insufficient documentation

## 2008-09-09 ENCOUNTER — Telehealth (INDEPENDENT_AMBULATORY_CARE_PROVIDER_SITE_OTHER): Payer: Self-pay | Admitting: *Deleted

## 2008-09-14 ENCOUNTER — Encounter: Admission: RE | Admit: 2008-09-14 | Discharge: 2008-09-14 | Payer: Self-pay | Admitting: Family Medicine

## 2008-10-10 ENCOUNTER — Ambulatory Visit: Payer: Self-pay | Admitting: Cardiology

## 2008-10-24 ENCOUNTER — Ambulatory Visit: Payer: Self-pay | Admitting: Family Medicine

## 2008-11-07 ENCOUNTER — Ambulatory Visit: Payer: Self-pay | Admitting: Cardiology

## 2008-11-07 LAB — CONVERTED CEMR LAB: POC INR: 1.7

## 2008-11-28 ENCOUNTER — Ambulatory Visit: Payer: Self-pay | Admitting: Internal Medicine

## 2008-11-30 ENCOUNTER — Ambulatory Visit: Payer: Self-pay | Admitting: Family Medicine

## 2008-12-01 ENCOUNTER — Encounter: Admission: RE | Admit: 2008-12-01 | Discharge: 2008-12-01 | Payer: Self-pay | Admitting: Family Medicine

## 2008-12-01 ENCOUNTER — Telehealth: Payer: Self-pay | Admitting: Cardiology

## 2008-12-05 ENCOUNTER — Encounter: Payer: Self-pay | Admitting: Physician Assistant

## 2008-12-05 ENCOUNTER — Ambulatory Visit: Payer: Self-pay | Admitting: Internal Medicine

## 2008-12-05 ENCOUNTER — Ambulatory Visit: Payer: Self-pay

## 2008-12-14 ENCOUNTER — Telehealth (INDEPENDENT_AMBULATORY_CARE_PROVIDER_SITE_OTHER): Payer: Self-pay | Admitting: *Deleted

## 2008-12-14 ENCOUNTER — Ambulatory Visit: Payer: Self-pay | Admitting: Cardiology

## 2008-12-14 LAB — CONVERTED CEMR LAB
BUN: 15 mg/dL (ref 6–23)
CO2: 33 meq/L — ABNORMAL HIGH (ref 19–32)
Chloride: 103 meq/L (ref 96–112)
Glucose, Bld: 102 mg/dL — ABNORMAL HIGH (ref 70–99)
Potassium: 4.4 meq/L (ref 3.5–5.1)
Pro B Natriuretic peptide (BNP): 193 pg/mL — ABNORMAL HIGH (ref 0.0–100.0)

## 2008-12-19 ENCOUNTER — Ambulatory Visit: Payer: Self-pay | Admitting: Cardiology

## 2008-12-23 ENCOUNTER — Encounter: Payer: Self-pay | Admitting: Family Medicine

## 2008-12-23 ENCOUNTER — Ambulatory Visit: Payer: Self-pay | Admitting: Family Medicine

## 2008-12-23 LAB — CONVERTED CEMR LAB
AST: 29 units/L (ref 0–37)
Alkaline Phosphatase: 83 units/L (ref 39–117)
BUN: 14 mg/dL (ref 6–23)
Bilirubin Urine: NEGATIVE
Creatinine, Ser: 0.75 mg/dL (ref 0.40–1.20)
HCT: 41.7 % (ref 36.0–46.0)
Hemoglobin: 13.6 g/dL (ref 12.0–15.0)
MCHC: 32.6 g/dL (ref 30.0–36.0)
Potassium: 4.4 meq/L (ref 3.5–5.3)
RDW: 14.9 % (ref 11.5–15.5)

## 2008-12-24 ENCOUNTER — Inpatient Hospital Stay (HOSPITAL_COMMUNITY): Admission: EM | Admit: 2008-12-24 | Discharge: 2008-12-28 | Payer: Self-pay | Admitting: Emergency Medicine

## 2008-12-24 ENCOUNTER — Encounter: Payer: Self-pay | Admitting: Family Medicine

## 2008-12-24 ENCOUNTER — Ambulatory Visit: Payer: Self-pay | Admitting: Family Medicine

## 2009-01-02 ENCOUNTER — Ambulatory Visit: Payer: Self-pay | Admitting: Cardiology

## 2009-01-03 ENCOUNTER — Telehealth: Payer: Self-pay | Admitting: Cardiology

## 2009-01-05 ENCOUNTER — Telehealth: Payer: Self-pay | Admitting: Family Medicine

## 2009-01-10 ENCOUNTER — Ambulatory Visit: Payer: Self-pay | Admitting: Family Medicine

## 2009-01-10 ENCOUNTER — Encounter: Payer: Self-pay | Admitting: Family Medicine

## 2009-01-16 ENCOUNTER — Ambulatory Visit: Payer: Self-pay | Admitting: Cardiology

## 2009-02-13 ENCOUNTER — Ambulatory Visit: Payer: Self-pay | Admitting: Cardiology

## 2009-03-13 ENCOUNTER — Ambulatory Visit: Payer: Self-pay | Admitting: Internal Medicine

## 2009-03-13 LAB — CONVERTED CEMR LAB: POC INR: 2.5

## 2009-03-23 ENCOUNTER — Ambulatory Visit: Payer: Self-pay | Admitting: Family Medicine

## 2009-03-23 DIAGNOSIS — G5690 Unspecified mononeuropathy of unspecified upper limb: Secondary | ICD-10-CM | POA: Insufficient documentation

## 2009-04-13 ENCOUNTER — Ambulatory Visit: Payer: Self-pay | Admitting: Internal Medicine

## 2009-04-13 LAB — CONVERTED CEMR LAB: POC INR: 2.2

## 2009-04-17 ENCOUNTER — Encounter: Admission: RE | Admit: 2009-04-17 | Discharge: 2009-04-17 | Payer: Self-pay | Admitting: Family Medicine

## 2009-05-09 ENCOUNTER — Encounter: Payer: Self-pay | Admitting: Family Medicine

## 2009-05-11 ENCOUNTER — Ambulatory Visit: Payer: Self-pay | Admitting: Cardiology

## 2009-06-01 ENCOUNTER — Ambulatory Visit: Payer: Self-pay | Admitting: Family Medicine

## 2009-06-01 DIAGNOSIS — M25519 Pain in unspecified shoulder: Secondary | ICD-10-CM | POA: Insufficient documentation

## 2009-06-02 ENCOUNTER — Encounter: Admission: RE | Admit: 2009-06-02 | Discharge: 2009-06-02 | Payer: Self-pay | Admitting: Family Medicine

## 2009-06-08 ENCOUNTER — Ambulatory Visit: Payer: Self-pay | Admitting: Internal Medicine

## 2009-06-08 LAB — CONVERTED CEMR LAB: POC INR: 3.2

## 2009-06-14 ENCOUNTER — Ambulatory Visit: Payer: Self-pay | Admitting: Family Medicine

## 2009-06-14 ENCOUNTER — Encounter: Payer: Self-pay | Admitting: Family Medicine

## 2009-06-14 DIAGNOSIS — E739 Lactose intolerance, unspecified: Secondary | ICD-10-CM | POA: Insufficient documentation

## 2009-06-14 LAB — CONVERTED CEMR LAB
ALT: 15 units/L (ref 0–35)
AST: 19 units/L (ref 0–37)
Creatinine, Ser: 0.7 mg/dL (ref 0.40–1.20)
Direct LDL: 119 mg/dL — ABNORMAL HIGH
Hgb A1c MFr Bld: 5.7 %
Total Bilirubin: 0.7 mg/dL (ref 0.3–1.2)

## 2009-06-15 ENCOUNTER — Encounter: Payer: Self-pay | Admitting: Family Medicine

## 2009-06-29 ENCOUNTER — Ambulatory Visit: Payer: Self-pay | Admitting: Cardiology

## 2009-07-11 ENCOUNTER — Ambulatory Visit: Payer: Self-pay | Admitting: Family Medicine

## 2009-07-11 ENCOUNTER — Encounter: Payer: Self-pay | Admitting: Family Medicine

## 2009-07-27 ENCOUNTER — Ambulatory Visit: Payer: Self-pay | Admitting: Cardiology

## 2009-08-24 ENCOUNTER — Ambulatory Visit: Payer: Self-pay | Admitting: Internal Medicine

## 2009-09-14 ENCOUNTER — Ambulatory Visit: Payer: Self-pay | Admitting: Cardiology

## 2009-09-14 LAB — CONVERTED CEMR LAB: POC INR: 3.5

## 2009-09-28 ENCOUNTER — Ambulatory Visit: Payer: Self-pay | Admitting: Internal Medicine

## 2009-09-28 LAB — CONVERTED CEMR LAB: POC INR: 2.5

## 2009-10-23 ENCOUNTER — Encounter: Payer: Self-pay | Admitting: Family Medicine

## 2009-10-27 ENCOUNTER — Ambulatory Visit: Payer: Self-pay | Admitting: Family Medicine

## 2009-11-06 ENCOUNTER — Ambulatory Visit: Payer: Self-pay | Admitting: Internal Medicine

## 2009-11-22 ENCOUNTER — Ambulatory Visit: Payer: Self-pay | Admitting: Family Medicine

## 2009-11-24 ENCOUNTER — Encounter: Payer: Self-pay | Admitting: Family Medicine

## 2009-12-04 ENCOUNTER — Ambulatory Visit: Payer: Self-pay | Admitting: Cardiovascular Disease

## 2009-12-04 LAB — CONVERTED CEMR LAB: POC INR: 2.5

## 2010-01-01 ENCOUNTER — Ambulatory Visit: Payer: Self-pay | Admitting: Internal Medicine

## 2010-01-29 ENCOUNTER — Ambulatory Visit: Admission: RE | Admit: 2010-01-29 | Discharge: 2010-01-29 | Payer: Self-pay | Source: Home / Self Care

## 2010-01-31 ENCOUNTER — Ambulatory Visit: Admission: RE | Admit: 2010-01-31 | Discharge: 2010-01-31 | Payer: Self-pay | Source: Home / Self Care

## 2010-01-31 ENCOUNTER — Encounter: Payer: Self-pay | Admitting: Family Medicine

## 2010-01-31 DIAGNOSIS — R5381 Other malaise: Secondary | ICD-10-CM | POA: Insufficient documentation

## 2010-01-31 DIAGNOSIS — R5383 Other fatigue: Secondary | ICD-10-CM

## 2010-01-31 LAB — CONVERTED CEMR LAB
BUN: 18 mg/dL (ref 6–23)
CO2: 24 meq/L (ref 19–32)
Glucose, Bld: 97 mg/dL (ref 70–99)
HCT: 39.8 % (ref 36.0–46.0)
MCHC: 31.7 g/dL (ref 30.0–36.0)
MCV: 88.2 fL (ref 78.0–100.0)
Platelets: 275 10*3/uL (ref 150–400)
Potassium: 4.1 meq/L (ref 3.5–5.3)
RDW: 15.3 % (ref 11.5–15.5)
Sodium: 140 meq/L (ref 135–145)
TSH: 0.857 microintl units/mL (ref 0.350–4.500)

## 2010-02-02 ENCOUNTER — Encounter: Payer: Self-pay | Admitting: *Deleted

## 2010-02-03 ENCOUNTER — Encounter: Payer: Self-pay | Admitting: Family Medicine

## 2010-02-12 ENCOUNTER — Ambulatory Visit
Admission: RE | Admit: 2010-02-12 | Discharge: 2010-02-12 | Payer: Self-pay | Source: Home / Self Care | Attending: Family Medicine | Admitting: Family Medicine

## 2010-02-12 DIAGNOSIS — R634 Abnormal weight loss: Secondary | ICD-10-CM | POA: Insufficient documentation

## 2010-02-12 DIAGNOSIS — J069 Acute upper respiratory infection, unspecified: Secondary | ICD-10-CM | POA: Insufficient documentation

## 2010-02-13 NOTE — Medication Information (Signed)
Summary: rov/sp  Anticoagulant Therapy  Managed by: Bethena Midget, RN, BSN Referring MD: Valera Castle MD PCP: [name]  MD Supervising MD: Jens Som MD, Arlys John Indication 1: Atrial Fibrillation (ICD-427.31) Lab Used: [facility] Site: [facility] INR POC 2.7 INR RANGE 2 - 3  Dietary changes: no    Health status changes: no    Bleeding/hemorrhagic complications: no    Recent/future hospitalizations: no    Any changes in medication regimen? yes       Details: Took ABX for Sinus Infection for 5 days over the 4th July week  Recent/future dental: no  Any missed doses?: no       Is patient compliant with meds? yes       Allergies: 1)  ! Penicillin 2)  ! Morphine  Anticoagulation Management History:      The patient is taking warfarin and comes in today for a routine follow up visit.  Positive risk factors for bleeding include an age of 74 years or older.  Negative risk factors for bleeding include no history of CVA/TIA.  The bleeding index is 'intermediate risk'.  Positive CHADS2 values include History of CHF, History of HTN, and Age > 80 years old.  Negative CHADS2 values include History of Diabetes and Prior Stroke/CVA/TIA.  The start date was 09/05/2003.  Anticoagulation responsible provider: Jens Som MD, Arlys John.  INR POC: 2.7.  Cuvette Lot#: [id number].  Exp: 09/2010.    Anticoagulation Management Assessment/Plan:      The patient's current anticoagulation dose is Coumadin 5 mg tabs: Take as directed by coumadin clinic..  The target INR is 2 - 3.  The next INR is due 08/24/2009.  Anticoagulation instructions were given to patient.  Results were reviewed/authorized by Bethena Midget, RN, BSN.  She was notified by Bethena Midget, RN, BSN.         Prior Anticoagulation Instructions: INR 2.6  Continue same dose of 1 1/2 tablets every day except 1 tablet on Sunday and Friday.    Current Anticoagulation Instructions: INR 2.7 Continue 7.5mg s everyday except 5mg s on Sundays and  Fridays. REcheck in 4 weeks.

## 2010-02-13 NOTE — Medication Information (Signed)
Summary: rov/sp  Anticoagulant Therapy  Managed by: Weston Brass, PharmD Referring MD: Valera Castle MD PCP: [name]  MD Supervising MD: Tenny Craw MD, Gunnar Fusi Indication 1: Atrial Fibrillation (ICD-427.31) Lab Used: [facility] Site: [facility] INR POC 3.2 INR RANGE 2 - 3  Dietary changes: no    Health status changes: no    Bleeding/hemorrhagic complications: no    Recent/future hospitalizations: no    Any changes in medication regimen? yes       Details: started celebrex last week  Recent/future dental: no  Any missed doses?: no       Is patient compliant with meds? yes       Allergies: 1)  ! Penicillin 2)  ! Morphine  Anticoagulation Management History:      The patient is taking warfarin and comes in today for a routine follow up visit.  Positive risk factors for bleeding include an age of 75 years or older.  Negative risk factors for bleeding include no history of CVA/TIA.  The bleeding index is 'intermediate risk'.  Positive CHADS2 values include History of CHF, History of HTN, and Age > 7 years old.  Negative CHADS2 values include History of Diabetes and Prior Stroke/CVA/TIA.  The start date was 09/05/2003.  Anticoagulation responsible provider: Tenny Craw MD, Gunnar Fusi.  INR POC: 3.2.  Cuvette Lot#: [id number].  Exp: 08/2010.    Anticoagulation Management Assessment/Plan:      The patient's current anticoagulation dose is Coumadin 5 mg tabs: Take as directed by coumadin clinic..  The target INR is 2 - 3.  The next INR is due 07/06/2009.  Anticoagulation instructions were given to patient.  Results were reviewed/authorized by Weston Brass, PharmD.  She was notified by Weston Brass PharmD.         Prior Anticoagulation Instructions: INR 2.8  Continue same dose of 1 1/2 tablets every day except 1 tablet on Sunday and Friday   Current Anticoagulation Instructions: INR 3.2  Skip tomorrow's dose of Coumadin then resume same dose of 1 1/2 tablet every day except 1 tablet on Sunday  and Friday

## 2010-02-13 NOTE — Medication Information (Signed)
Summary: rov/ewj   Anticoagulant Therapy  Managed by: Weston Brass, PharmD Referring MD: Valera Castle MD PCP: Barnabas Lister  MD Supervising MD: Nahser Indication 1: Atrial Fibrillation (ICD-427.31) Lab Used: [facility] Site: [facility] INR POC 2.5 INR RANGE 2 - 3  Dietary changes: no    Health status changes: yes       Details: Sinus problems  Bleeding/hemorrhagic complications: no    Recent/future hospitalizations: no    Any changes in medication regimen? no    Recent/future dental: no  Any missed doses?: no       Is patient compliant with meds? yes       Allergies: 1)  ! Penicillin 2)  ! Morphine  Anticoagulation Management History:      The patient is taking warfarin and comes in today for a routine follow up visit.  Positive risk factors for bleeding include an age of 75 years or older.  Negative risk factors for bleeding include no history of CVA/TIA.  The bleeding index is 'intermediate risk'.  Positive CHADS2 values include History of CHF, History of HTN, and Age > 47 years old.  Negative CHADS2 values include History of Diabetes and Prior Stroke/CVA/TIA.  The start date was 09/05/2003.  Anticoagulation responsible Njeri Vicente: Nahser.  INR POC: 2.5.  Cuvette Lot#: [id number].  Exp: 12/2010.    Anticoagulation Management Assessment/Plan:      The patient's current anticoagulation dose is Coumadin 5 mg tabs: Take as directed by coumadin clinic..  The target INR is 2 - 3.  The next INR is due 01/01/2010.  Anticoagulation instructions were given to patient.  Results were reviewed/authorized by Weston Brass, PharmD.  She was notified by Hoy Register, PharmD Candidate.         Prior Anticoagulation Instructions: INR 2.9  Continue on same dosage 1.5 tablets daily except 1 tablet on Mondays, Wednesdays, and Fridays.  Recheck in 4 weeks.    Current Anticoagulation Instructions: INR 2.5 Continue previous dose of 1.5 tablets everyday except 1 tablet on Monday, Wednesday, and  Friday Recheck INR in 4 weeks

## 2010-02-13 NOTE — Medication Information (Signed)
Summary: rov/ewj  Anticoagulant Therapy  Managed by: Bethena Midget, RN, BSN Referring MD: Valera Castle MD PCP: [name]  MD Supervising MD: Myrtis Ser MD, Tinnie Gens Indication 1: Atrial Fibrillation (ICD-427.31) Lab Used: [facility] Site: [facility] INR POC 2.9 INR RANGE 2 - 3  Dietary changes: no    Health status changes: yes       Details: Having some Nausea today and last week had some vomiting  Bleeding/hemorrhagic complications: no    Recent/future hospitalizations: no    Any changes in medication regimen? no    Recent/future dental: no  Any missed doses?: no       Is patient compliant with meds? yes       Allergies: 1)  ! Penicillin 2)  ! Morphine  Anticoagulation Management History:      The patient is taking warfarin and comes in today for a routine follow up visit.  Positive risk factors for bleeding include an age of 75 years or older.  Negative risk factors for bleeding include no history of CVA/TIA.  The bleeding index is 'intermediate risk'.  Positive CHADS2 values include History of CHF, History of HTN, and Age > 75 years old.  Negative CHADS2 values include History of Diabetes and Prior Stroke/CVA/TIA.  The start date was 09/05/2003.  Anticoagulation responsible provider: Myrtis Ser MD, Tinnie Gens.  INR POC: 2.9.  Cuvette Lot#: [id number].  Exp: 04/2010.    Anticoagulation Management Assessment/Plan:      The patient's current anticoagulation dose is Coumadin 5 mg tabs: Take as directed by coumadin clinic..  The target INR is 2 - 3.  The next INR is due 03/13/2009.  Anticoagulation instructions were given to patient.  Results were reviewed/authorized by Bethena Midget, RN, BSN.  She was notified by Bethena Midget, RN, BSN.         Prior Anticoagulation Instructions: INR 2.2  Continue on same dosage 1.5 tablets daily except 1 tablet on Sundays and Fridays.  Recheck in 4 weeks.      Current Anticoagulation Instructions: INR 2.9 Continue 7.5mg s everyday except 5mg s on  Sundays and Fridays. Recheck in 4 weeks.

## 2010-02-13 NOTE — Letter (Signed)
Summary: Generic Letter  [facility]  [address]   [place], [place] [address]   Phone: [phone]  Fax: [phone]    05/09/2009  [facility] [address] [place], [place]  [address]  Dear Ms. Cislo,  It has been my pleasure to have been your physician over the last 3 years. I truly have enjoyed getting to know you and helping you with your healthcare needs. Over the next several weeks, you will be assigned a new provider at [facility]. He/She will have access to all of your records and continue to provide you with excellent care. If you have any questions, please feel free to contact our office.  Sincerely,   [name]  MD  Appended Document: Generic Letter mailed

## 2010-02-13 NOTE — Assessment & Plan Note (Signed)
Summary: f/u/kh   Vital Signs:  Patient profile:   77 year old female Weight:      235.5 pounds Temp:     97.5 degrees F oral Pulse rate:   87 / minute Pulse rhythm:   regular BP sitting:   129 / 85  (left arm) Cuff size:   large  Vitals Entered By: Loralee Pacas CMA (June 14, 2009 8:52 AM) CC: follow-up visit, neck Is Patient Diabetic? No   Primary Care Provider:  [name]  MD  CC:  follow-up visit and neck.  History of Present Illness: R shoulder pain- persistent issue. has gotten injections in the past which help. taking celebrex which helps some.   glucose intolerance- would like sugar checked. denies polyuria, polydipsia, unintended weight loss.   HTN- on amlodipine, benazepril, spironolactone, and lasix. denies chest pain, headaches, blurred vision. some dyspnea  on exertion at baseline, not worsening. minimal peripheral edema.   Habits & Providers  Alcohol-Tobacco-Diet     Tobacco Status: never     Tobacco Counseling: not indicated; no tobacco use  Current Medications (verified): 1)  Lasix 40 Mg Tabs (Furosemide) .... Take 1and 1/2  Tablet By Mouth Once A Day 2)  Lotrisone 1-0.05 %  Crea (Clotrimazole-Betamethasone) .... Apply Generous Amount To Affected Areas Twice A Day. Please Dispense 60g Tube 3)  Amlodipine Besy-Benazepril Hcl 10-20 Mg  Caps (Amlodipine Besy-Benazepril Hcl) .... One Tab By Mouth Daily 4)  Tramadol Hcl 50 Mg  Tabs (Tramadol Hcl) .... One Tab By Mouth Q6 As Needed Pain 5)  Spironolactone 25 Mg Tabs (Spironolactone) .... One Tab By Mouth Daily 6)  Coumadin 5 Mg Tabs (Warfarin Sodium) .... Take As Directed By Coumadin Clinic. 7)  Acetaminophen 650 Mg  Tbcr (Acetaminophen) .... As Needed 8)  Multivitamins   Tabs (Multiple Vitamin) .Marland Kitchen.. 1 Tab Once Daily 9)  Os-Cal 500 + D 500-200 Mg-Unit Tabs (Calcium Carbonate-Vitamin D) .Marland Kitchen.. 1 Tab Daily 10)  Omeprazole 20 Mg Cpdr (Omeprazole) .... One Tab By Mouth Daily 11)  Celebrex 200 Mg Caps  (Celecoxib) .... Take 2 On Day #1, and Then One Daily For 7 Days Then One Daily As Needed.  Allergies (verified): 1)  ! Penicillin 2)  ! Morphine  Social History: Smoking Status:  never  Physical Exam  General:  Elderly female in good condition, obese, moved well. vitals reviewed.  Mouth:  MMM Lungs:  Normal respiratory effort, chest expands symmetrically. Lungs are clear to auscultation, no crackles or wheezes. Heart:  irregularly irregular.  Abdomen:  overweight, NT, ND Msk:  limited ROM of right shoulder, complained of it hurting about 100 degree extension but was able to fully extend actively.  Posterior tenderness, negative pain with internal and external rotation. unable to fully abduct and externally rotate.   Extremities:  nonpitting edema of B ankles;  Procedure Note Last Tetanus: not due (03/07/2008)  Injections: The patient complains of pain, inflammation, and tenderness. Duration of symptoms: weeks Indication: chronic pain Consent signed: yes  Procedure # 1: joint injection    Region: anterior     Location: right shoulder    Technique: 20 g needle    Medication: 1 ml kenalog 40 mg    Anesthesia: 2.0 ml 1% lidocaine w/o epinephrine    Comment: appropriate time out completed. patient tolerated injection well. no bleeding noted.   Cleaned and prepped with: alcohol and betadine Wound dressing: bandaid   Impression & Recommendations:  Problem # 1:  SHOULDER PAIN, RIGHT (ICD-719.41)  see procedure note. patient tolerated injection well.   Her updated medication list for this problem includes:    Tramadol Hcl 50 Mg Tabs (Tramadol hcl) ..... One tab by mouth q6 as needed pain    Acetaminophen 650 Mg Tbcr (Acetaminophen) .Marland Kitchen... As needed    Celebrex 200 Mg Caps (Celecoxib) .Marland Kitchen... Take 2 on day #1, and then one daily for 7 days then one daily as needed.  Orders: [facility]- Est  Level 4 (47829) Injection, large joint- [facility] (56213)  Problem # 2:  GLUCOSE INTOLERANCE  (ICD-271.3) Assessment: Improved A1C in normal range. monitor.   Orders: A1C-[facility] (08657) [facility]- Est  Level 4 (84696)  Problem # 3:  HYPERTENSION, BENIGN ESSENTIAL (ICD-401.1) Assessment: Unchanged at goal. no changes. check labs.   Her updated medication list for this problem includes:    Lasix 40 Mg Tabs (Furosemide) .Marland Kitchen... Take 1and 1/2  tablet by mouth once a day    Amlodipine Besy-benazepril Hcl 10-20 Mg Caps (Amlodipine besy-benazepril hcl) ..... One tab by mouth daily    Spironolactone 25 Mg Tabs (Spironolactone) ..... One tab by mouth daily  Orders: Comp Met-[facility] [id number]) Direct LDL-[facility] [phone]) [facility]- Est  Level 4 (64403)  Laboratory Results   Blood Tests   Date/Time Received: June 14, 2009 9:52 AM  Date/Time Reported: June 14, 2009 10:10 AM   HGBA1C: 5.7%   (Normal Range: Non-Diabetic - 3-6%   Control Diabetic - 6-8%)  Comments: ...............test performed by......Marland KitchenBonnie A. [place], MLS (ASCP)cm        Prevention & Chronic Care Immunizations   Influenza vaccine: Fluvax MCR  (10/24/2008)   Influenza vaccine deferral: Not available  (09/08/2008)   Influenza vaccine due: 10/27/2008    Tetanus booster: 03/07/2008: not due   Tetanus booster due: 03/07/2018    Pneumococcal vaccine: Done.  (11/14/1997)   Pneumococcal vaccine deferral: Not indicated  (09/08/2008)   Pneumococcal vaccine due: None    H. zoster vaccine: Not documented   H. zoster vaccine deferral: Not available  (09/08/2008)  Colorectal Screening   Hemoccult: had colonoscopy. no need for hemoccults  (10/29/2007)   Hemoccult due: Not Indicated    Colonoscopy: done by Dr. Matthias Hughs Deboraha Sprang). four diminuitive polyps in ascending colon and cecum. One 8mm polyp proximal to anus, resected. multiple diminuitive polyps in recto-sigmoid colon. Diverticulosis in descending and ascending colon.  (07/08/2007)   Colonoscopy due: 07/07/2017  Other Screening   Pap smear: not indicated 2/2   hysterectomy  (10/28/2007)   Pap smear due: Not Indicated    Mammogram: ASSESSMENT: Negative - BI-RADS 1^MM DIGITAL SCREENING  (04/17/2009)   Mammogram due: 03/24/2007    DXA bone density scan: Not documented   DXA bone density action/deferral: Ordered  (09/08/2008)   Smoking status: never  (06/14/2009)  Lipids   Total Cholesterol: 176  (03/07/2008)   LDL: 107  (03/07/2008)   LDL Direct: Not documented   HDL: 51  (03/07/2008)   Triglycerides: 88  (03/07/2008)  Hypertension   Last Blood Pressure: 129 / 85  (06/14/2009)   Serum creatinine: 0.75  (12/23/2008)   Serum potassium 4.4  (12/23/2008) CMP ordered     Hypertension flowsheet reviewed?: Yes   Progress toward BP goal: At goal  Self-Management Support :   Personal Goals (by the next clinic visit) :      Personal blood pressure goal: 140/90  (09/08/2008)   Hypertension self-management support: Written self-care plan  (09/08/2008)    Hypertension self-management support not done because: Good outcomes  (  06/14/2009)  

## 2010-02-13 NOTE — Medication Information (Signed)
Summary: rov/ewj  Anticoagulant Therapy  Managed by: Weston Brass, PharmD Referring MD: Valera Castle MD PCP: [name]  MD Supervising MD: Myrtis Ser MD, Tinnie Gens Indication 1: Atrial Fibrillation (ICD-427.31) Lab Used: [facility] Site: [facility] INR POC 2.8 INR RANGE 2 - 3  Dietary changes: no    Health status changes: no    Bleeding/hemorrhagic complications: no    Recent/future hospitalizations: no    Any changes in medication regimen? no    Recent/future dental: no  Any missed doses?: no       Is patient compliant with meds? yes       Allergies: 1)  ! Penicillin 2)  ! Morphine  Anticoagulation Management History:      The patient is taking warfarin and comes in today for a routine follow up visit.  Positive risk factors for bleeding include an age of 31 years or older.  Negative risk factors for bleeding include no history of CVA/TIA.  The bleeding index is 'intermediate risk'.  Positive CHADS2 values include History of CHF, History of HTN, and Age > 63 years old.  Negative CHADS2 values include History of Diabetes and Prior Stroke/CVA/TIA.  The start date was 09/05/2003.  Anticoagulation responsible provider: Myrtis Ser MD, Tinnie Gens.  INR POC: 2.8.  Cuvette Lot#: [id number].  Exp: 05/2010.    Anticoagulation Management Assessment/Plan:      The patient's current anticoagulation dose is Coumadin 5 mg tabs: Take as directed by coumadin clinic..  The target INR is 2 - 3.  The next INR is due 06/08/2009.  Anticoagulation instructions were given to patient.  Results were reviewed/authorized by Weston Brass, PharmD.  She was notified by Weston Brass PharmD.         Prior Anticoagulation Instructions: INR 2.2  Continue on same dosage 1.5 tablets daily except 1 tablet on Sundays and Fridays.  Recheck in 4 weeks.    Current Anticoagulation Instructions: INR 2.8  Continue same dose of 1 1/2 tablets every day except 1 tablet on Sunday and Friday

## 2010-02-13 NOTE — Assessment & Plan Note (Signed)
Summary: meet new doc/bmc   Vital Signs:  Patient profile:   77 year old female Height:      98.5 inches Weight:      230 pounds BMI:     16.73 Temp:     98.5 degrees F oral Pulse rate:   96 / minute BP sitting:   115 / 76  (left arm) Cuff size:   large  Vitals Entered By: Tessie Fass CMA (October 27, 2009 2:18 PM) CC: meet new doc Pain Assessment Patient in pain? no        Primary Provider:  Barnabas Lister  MD  CC:  meet new doc.  History of Present Illness: CC: meet new doc, upset stomach  HPI: This is 77 yo female who presents to clinic to meet her new doc.   She c/o nausea, stomach cramps, and gagging for the last 2-3 weeks.  It occurs in the morning when she wakes up and when she becomes anxious.  Pt's daughter was recently diagnosed with depression and has left home to live independently.  Pt lives in an elderly community and daughter was not allowed to live with her.  Pt expresses that she does not like living alone and it has been hard on her.  She does have a hx of depression,  but discontinued Prozac b/c she felt like she didn't need it anymore.  SIGECAPS reviewed: loss of interest, decreased appetite, no guilt, lack of sleep, no concentration issues, no suicidal thoughts, decreased energy.  Also, states that her pain from arthritis has become worse in the last 2-3 weeks and Tylenol is not helping.  ROS: Denies CP, SOB, abdominal pain, vomiting.  Does endorse nausea, weakness, constipation, and heartburn.  Current Medications (verified): 1)  Lasix 40 Mg Tabs (Furosemide) .Marland Kitchen.. 1 Tab Once Daily 2)  Lotrisone 1-0.05 %  Crea (Clotrimazole-Betamethasone) .... Apply Generous Amount To Affected Areas Twice A Day. Please Dispense 90g Tube 3)  Amlodipine Besy-Benazepril Hcl 10-20 Mg  Caps (Amlodipine Besy-Benazepril Hcl) .... One Tab By Mouth Daily 4)  Spironolactone 25 Mg Tabs (Spironolactone) .... One Tab By Mouth Daily 5)  Coumadin 5 Mg Tabs (Warfarin Sodium) .... Take As  Directed By Coumadin Clinic. 6)  Acetaminophen 650 Mg  Tbcr (Acetaminophen) .... One Tab By Mouth Q4-Q6 Hours As Needed For Pain 7)  Multivitamins   Tabs (Multiple Vitamin) .Marland Kitchen.. 1 Tab Once Daily 8)  Os-Cal 500 + D 500-200 Mg-Unit Tabs (Calcium Carbonate-Vitamin D) .Marland Kitchen.. 1 Tab Daily 9)  Omeprazole 20 Mg Cpdr (Omeprazole) .... One Tab By Mouth Daily 10)  Zostrix Arthritis Pain Relief 0.025 % Crea (Capsaicin) .... Apply To Affected Areas 2-3 Times Per Day.  Avoid Eyes, Mouth, Nose, Open Wounds.  Allergies (verified): 1)  ! Penicillin 2)  ! Morphine  Past History:  Past Medical History: Last updated: 11/30/2008 Per cardiology NO MORE B BLOCKERS OR AV NODE DRUGS 2 to cardiac pause on Holter study. HTN A. Fib diastolic dysfunction CHF Obesity intertrigo Hx of carpal tunnel bilateral ( s/p sx) OA knee (bilateral) -GERD (h pylori positive) -h/o benign colon polyps s/p cholescystectomy  Past Surgical History: Last updated: 03/07/2008 -24 hours Holter that showed two 2.2 secs pauses. Per cardiology NO MORE B BLOCKERS OR AV NODE DRUGS. -B carpal tunnel surgeries  - colonoscopy 6/09 polyps. repeat 10 years - s/p cholescystectomy - s/p complete hysterectomy -s/p partial medial and lateral meniscectomies on R knee, R knee chondroplasty 11/2001 -s/p L knee arthroscopic partial medial  and lateral meniscectomies, partial synovectomy 08/1999 -ECHO 02/2008 EF 55%,  There was dyssynergic motion of the interventricular septum, consistent with a conduction abnormality or paced rhythm. There was mild mitral annular calcification. There was mild         mitral valvular regurgitation. The left atrium was moderately dilated. The right atrium was moderately dilated  Family History: Last updated: 29-Jul-2008 Mother -- died at 73 from colon ca, siblings w/ HTN, DM, mother also had asthma  Social History: Last updated: 10/27/2009 widowed, lives alone in elderly community, daughter recently moved out and  dx with depression.  No etoh, tob, or IVDA.; Jehovah's Witness--No Blood (POA Document in chart); Does NOT want to be organ donor; Advanced directive - states no blood products unless approved by herself or POA; Pt does not want life prolonged if situation is hopeless Pt has 11  children ( 3 sons, 8 girls), 9 of them alive.  Social History: widowed, lives alone in elderly community, daughter recently moved out and dx with depression.  No etoh, tob, or IVDA.; Jehovah's Witness--No Blood (POA Document in chart); Does NOT want to be organ donor; Advanced directive - states no blood products unless approved by herself or POA; Pt does not want life prolonged if situation is hopeless Pt has 11  children ( 3 sons, 8 girls), 9 of them alive.  Review of Systems       per HPI  Physical Exam  General:  depressed mood, but in no acute distress Eyes:  No corneal or conjunctival inflammation noted. EOMI. Perrla. Vision grossly normal. Mouth:  Oral mucosa and oropharynx without lesions or exudates.  Neck:  No deformities, masses, or tenderness noted. Lungs:  Normal respiratory effort, chest expands symmetrically. Lungs are clear to auscultation, no crackles or wheezes. Heart:  no murmur, no gallop, no rub, no JVD, and irregular rhythm.   Abdomen:  Bowel sounds positive,abdomen soft and non-tender without masses, organomegaly or hernias noted. Msk:  decreased ROM, joint tenderness, joint swelling, enlarged MCP joints, and ulnar deviation of fingers.   Extremities:  No clubbing, cyanosis, edema   Impression & Recommendations:  Problem # 1:  DEPRESSIVE DISORDER (ICD-311) Assessment Deteriorated  This seems to be secondary to adjustment disorder.  Pt's daughter has moved to a new home and has recently been dx with Depression.  This has been hard on the patient and she does not like living alone.  Says that Prozac has helped in the past, and would be willing to restart the medication if depressive  symtoms do not improve in the next 2-4 weeks.  We discussed 2 options: 1) take Prozac now or 2) wait 2-4 weeks to see if her social situation improves and return to MD to re-assess.  She chose option 2.  I let patient know that if any time her symptoms worsen, to call me and I will send her a Rx.  Pt agreed and understood with plan.  May consider Cymbalta to help with both depression and arthritis.  Orders: [facility]- Est Level  3 (99213) Home Health Referral (Home Health)  Problem # 2:  ARTHRITIS, SHOULDERS, BILATERAL (ICD-716.91) Assessment: Deteriorated Pt says her pain has worsened in the last few weeks, likely secondary to adjustment disorder.  She says tylenol is not helping like it used to.  At this time, I recommended heating pads or cold compresses to relieve the pain along with Tylenol.   I also gave her a script for Zostrix cream to apply to affected  areas.   I will treat conservatively for now, and re-assess in 4 weeks.  If pain is still the same or worse, will consider shoulder injections for acute management and Prozac, Cymbalta, or Flexeril for chronic management.  Orders: [facility]- Est Level  3 (99213) Home Health Referral (Home Health)  Problem # 3:  ATRIAL FIBRILLATION (ICD-427.31) Assessment: Unchanged Pt sees [facility] for this chronic issue.  She gets her INR checked monthly and f/u with Dr. Anola Gurney every 6 months.  On coumadin.  Her updated medication list for this problem includes:    Amlodipine Besy-benazepril Hcl 10-20 Mg Caps (Amlodipine besy-benazepril hcl) ..... One tab by mouth daily    Coumadin 5 Mg Tabs (Warfarin sodium) .Marland Kitchen... Take as directed by coumadin clinic.  Orders: [facility]- Est Level  3 (99213) Home Health Referral (Home Health)  Problem # 4:  HYPERTENSION, BENIGN ESSENTIAL (ICD-401.1) Assessment: Improved  P's BP was great today!  BP 115/76.  Well controlled on Amlodipine and Spironolactone.  Will continue meds.    Her updated medication list for this problem  includes:    Lasix 40 Mg Tabs (Furosemide) .Marland Kitchen... 1 tab once daily    Amlodipine Besy-benazepril Hcl 10-20 Mg Caps (Amlodipine besy-benazepril hcl) ..... One tab by mouth daily    Spironolactone 25 Mg Tabs (Spironolactone) ..... One tab by mouth daily  Orders: [facility]- Est Level  3 (32355)  Complete Medication List: 1)  Lasix 40 Mg Tabs (Furosemide) .Marland Kitchen.. 1 tab once daily 2)  Lotrisone 1-0.05 % Crea (Clotrimazole-betamethasone) .... Apply generous amount to affected areas twice a day. please dispense 90g tube 3)  Amlodipine Besy-benazepril Hcl 10-20 Mg Caps (Amlodipine besy-benazepril hcl) .... One tab by mouth daily 4)  Spironolactone 25 Mg Tabs (Spironolactone) .... One tab by mouth daily 5)  Coumadin 5 Mg Tabs (Warfarin sodium) .... Take as directed by coumadin clinic. 6)  Acetaminophen 650 Mg Tbcr (Acetaminophen) .... One tab by mouth q4-q6 hours as needed for pain 7)  Multivitamins Tabs (Multiple vitamin) .Marland Kitchen.. 1 tab once daily 8)  Os-cal 500 + D 500-200 Mg-unit Tabs (Calcium carbonate-vitamin d) .Marland Kitchen.. 1 tab daily 9)  Omeprazole 20 Mg Cpdr (Omeprazole) .... One tab by mouth daily 10)  Zostrix Arthritis Pain Relief 0.025 % Crea (Capsaicin) .... Apply to affected areas 2-3 times per day.  avoid eyes, mouth, nose, open wounds.  Patient Instructions: 1)  Please schedule a f/u appointment with me in 4 weeks to re-evaluate anxiety and joint pain issues. 2)  Try using heating pads or ice packs to affected areas.  If pain does not improve with Tylenol and heat/ice, please call MD. 3)  Apply Zostrix cream to affected areas 2-3x per day.  Avoid eyes, mouth, open wounds. 4)  Home Health to visit your home for home safety evaluation if interested. 5)  Please call MD if you are experiencing recurrent falls, chest pain, SOB, inability to walk. Prescriptions: ZOSTRIX ARTHRITIS PAIN RELIEF 0.025 % CREA (CAPSAICIN) Apply to affected areas 2-3 times per day.  Avoid eyes, mouth, nose, open wounds.  #1 x 0    Entered and Authorized by:   Phylis Javed de Lawson Radar  MD   Signed by:   Barnabas Lister  MD on 10/27/2009   Method used:   Print then Give to Patient   RxID:   [id number]

## 2010-02-13 NOTE — Medication Information (Signed)
Summary: ccr  Anticoagulant Therapy  Managed by: Cloyde Reams, RN, BSN Referring MD: Valera Castle MD PCP: Barnabas Lister  MD Supervising MD: Tenny Craw MD, Gunnar Fusi Indication 1: Atrial Fibrillation (ICD-427.31) Lab Used: [facility] Site: [facility] INR POC 2.9 INR RANGE 2 - 3  Dietary changes: no    Health status changes: no    Bleeding/hemorrhagic complications: no    Recent/future hospitalizations: no    Any changes in medication regimen? no    Recent/future dental: no  Any missed doses?: no       Is patient compliant with meds? yes       Allergies: 1)  ! Penicillin 2)  ! Morphine  Anticoagulation Management History:      The patient is taking warfarin and comes in today for a routine follow up visit.  Positive risk factors for bleeding include an age of 60 years or older.  Negative risk factors for bleeding include no history of CVA/TIA.  The bleeding index is 'intermediate risk'.  Positive CHADS2 values include History of CHF, History of HTN, and Age > 20 years old.  Negative CHADS2 values include History of Diabetes and Prior Stroke/CVA/TIA.  The start date was 09/05/2003.  Anticoagulation responsible provider: Tenny Craw MD, Gunnar Fusi.  INR POC: 2.9.  Cuvette Lot#: [id number].  Exp: 11/2010.    Anticoagulation Management Assessment/Plan:      The patient's current anticoagulation dose is Coumadin 5 mg tabs: Take as directed by coumadin clinic..  The target INR is 2 - 3.  The next INR is due 12/04/2009.  Anticoagulation instructions were given to patient.  Results were reviewed/authorized by Cloyde Reams, RN, BSN.  She was notified by Cloyde Reams RN.         Prior Anticoagulation Instructions: INR 2.5  Continue taking 1 tablet on Monday, Wednesday, and Friday, and 1.5 tablets all other days.  Return to clinic in 1 month.    Current Anticoagulation Instructions: INR 2.9  Continue on same dosage 1.5 tablets daily except 1 tablet on Mondays, Wednesdays, and Fridays.  Recheck in 4  weeks.

## 2010-02-13 NOTE — Miscellaneous (Signed)
Summary: problem list   Clinical Lists Changes  Problems: Removed problem of POSTMENOPAUSAL STATUS (ICD-V49.81)

## 2010-02-13 NOTE — Assessment & Plan Note (Signed)
Summary: sore throat & drainage/[place]   Vital Signs:  Patient profile:   77 year old female Height:      65 inches Weight:      235.9 pounds BMI:     39.40 Temp:     98.1 degrees F oral Pulse rate:   90 / minute Pulse rhythm:   regular BP sitting:   116 / 78  (left arm) Cuff size:   large  Vitals Entered By: Golden Circle RN (July 11, 2009 9:19 AM)  Primary Care Provider:  [name]  MD   History of Present Illness: SINUSITIS Onset:  7-10 days Description: nasal congestion, postnasal drip Modifying factors: patient has not tried anything except nasal saline which helped some  Symptoms Cough: no  Discharge: yes  Fever: no Sinus Pressure: yes  Ears Blocked: yes  Teeth Ache: no  Frontal Headache: no  Second Sickening: no   Red Flags Change in mental state: no Change in vision: no    Current Medications (verified): 1)  Lasix 40 Mg Tabs (Furosemide) .Marland Kitchen.. 1 Tab Once Daily 2)  Lotrisone 1-0.05 %  Crea (Clotrimazole-Betamethasone) .... Apply Generous Amount To Affected Areas Twice A Day. Please Dispense 90g Tube 3)  Amlodipine Besy-Benazepril Hcl 10-20 Mg  Caps (Amlodipine Besy-Benazepril Hcl) .... One Tab By Mouth Daily 4)  Spironolactone 25 Mg Tabs (Spironolactone) .... One Tab By Mouth Daily 5)  Coumadin 5 Mg Tabs (Warfarin Sodium) .... Take As Directed By Coumadin Clinic. 6)  Acetaminophen 650 Mg  Tbcr (Acetaminophen) .... One Tab By Mouth Q4-Q6 Hours As Needed For Pain 7)  Multivitamins   Tabs (Multiple Vitamin) .Marland Kitchen.. 1 Tab Once Daily 8)  Os-Cal 500 + D 500-200 Mg-Unit Tabs (Calcium Carbonate-Vitamin D) .Marland Kitchen.. 1 Tab Daily 9)  Omeprazole 20 Mg Cpdr (Omeprazole) .... One Tab By Mouth Daily 10)  Celebrex 200 Mg Caps (Celecoxib) .... Take 2 On Day #1, and Then One Daily For 7 Days Then One Daily As Needed.  Allergies (verified): 1)  ! Penicillin 2)  ! Morphine  Physical Exam  General:  Elderly female in good condition, obese, moved well. vitals reviewed.    Head:  no sinus TTP Ears:  hearing grossly normal Nose:  no rhinorrhea Mouth:  MMM; postnasal drip. no erythema or exudates Neck:  No deformities, masses, or tenderness noted. Lungs:  Normal respiratory effort, chest expands symmetrically. Lungs are clear to auscultation, no crackles or wheezes. Heart:  irregularly irregular.    Impression & Recommendations:  Problem # 1:  OTHER DISEASES OF NASAL CAVITY AND SINUSES (ICD-478.19) Assessment New  concern for possible sinusitis. patient given rx for azithromycin, at her request; likely viral illness. f/u as needed. see patient instructions  Orders: [facility]- Est Level  3 (85462)  Patient Instructions: 1)  Use warm moist compresses, and over the counter decongestants (only as directed). Call if no improvement in 5-7 days, sooner if increasing pain, fever, or new symptoms.  2)  Get plenty of rest, drink lots of clear liquids, and use Tylenol or Ibuprofen for fever and comfort. Return in 7-10 days if you're not better: sooner if you'er feeling worse.  3)  You can try claritin or zyrtec to help with the drainage.  4)  The medication list was reviewed and reconciled.  All changed / newly prescribed medications were explained.  A complete medication list was provided to the patient / caregiver.  Prescriptions: AZITHROMYCIN 500 MG TABS (AZITHROMYCIN) one tab by mouth daily x3 days.  #3  x 0   Entered and Authorized by:   [name]  MD   Signed by:   [name]  MD on 07/11/2009   Method used:   Electronically to        [facility] (retail)       [address]       [place], [place]  [id number]       Ph: [phone]       Fax: [phone]   RxID:   [id number]

## 2010-02-13 NOTE — Miscellaneous (Signed)
  Clinical Lists Changes  Problems: Changed problem from CHF (ICD-428.0) to CHRONIC DIASTOLIC HEART FAILURE (ICD-428.32) 

## 2010-02-13 NOTE — Medication Information (Signed)
Summary: rov/tm  Anticoagulant Therapy  Managed by: Cloyde Reams, RN, BSN Referring MD: Valera Castle MD PCP: [name]  MD Supervising MD: Tenny Craw MD, Gunnar Fusi Indication 1: Atrial Fibrillation (ICD-427.31) Lab Used: [facility] Site: [facility] INR POC 2.5 INR RANGE 2 - 3  Dietary changes: no    Health status changes: no    Bleeding/hemorrhagic complications: no    Recent/future hospitalizations: no    Any changes in medication regimen? no    Recent/future dental: no  Any missed doses?: no       Is patient compliant with meds? yes       Allergies (verified): 1)  ! Penicillin 2)  ! Morphine  Anticoagulation Management History:      The patient is taking warfarin and comes in today for a routine follow up visit.  Positive risk factors for bleeding include an age of 25 years or older.  Negative risk factors for bleeding include no history of CVA/TIA.  The bleeding index is 'intermediate risk'.  Positive CHADS2 values include History of CHF, History of HTN, and Age > 59 years old.  Negative CHADS2 values include History of Diabetes and Prior Stroke/CVA/TIA.  The start date was 09/05/2003.  Anticoagulation responsible provider: Tenny Craw MD, Gunnar Fusi.  INR POC: 2.5.  Cuvette Lot#: [id number].  Exp: 05/2010.    Anticoagulation Management Assessment/Plan:      The patient's current anticoagulation dose is Coumadin 5 mg tabs: Take as directed by coumadin clinic..  The target INR is 2 - 3.  The next INR is due 04/10/2009.  Anticoagulation instructions were given to patient.  Results were reviewed/authorized by Cloyde Reams, RN, BSN.  She was notified by Cloyde Reams RN.         Prior Anticoagulation Instructions: INR 2.9 Continue 7.5mg s everyday except 5mg s on Sundays and Fridays. Recheck in 4 weeks.   Current Anticoagulation Instructions: INR 2.5  Continue on same dosage 1.5 tablets except 1 tablet on Sundays and Fridays.  Recheck in 4 weeks.

## 2010-02-13 NOTE — Letter (Signed)
Summary: Results Follow-up Letter  [facility]  [address]   [place], [place] [address]   Phone: [phone]  Fax: [phone]    06/15/2009  [address] [place], [place]  [address]  Dear Ms. Carothers,   All of your recent labwork was normal. Please remember to schedule an appointment in six months.   Sincerely,  [name]  MD [facility]

## 2010-02-13 NOTE — Medication Information (Signed)
Summary: rov/jk  Anticoagulant Therapy  Managed by: Leota Sauers, PharmD, BCPS, CPP Referring MD: Valera Castle MD PCP: [name]  MD Supervising MD: Jens Som MD, Arlys John Indication 1: Atrial Fibrillation (ICD-427.31) Lab Used: [facility] Site: [facility] INR POC 3.5 INR RANGE 2 - 3  Dietary changes: no    Health status changes: no    Bleeding/hemorrhagic complications: no    Recent/future hospitalizations: no    Any changes in medication regimen? no    Recent/future dental: no  Any missed doses?: no       Is patient compliant with meds? yes      Comments: takine 1gram APAP bid  Current Medications (verified): 1)  Lasix 40 Mg Tabs (Furosemide) .Marland Kitchen.. 1 Tab Once Daily 2)  Lotrisone 1-0.05 %  Crea (Clotrimazole-Betamethasone) .... Apply Generous Amount To Affected Areas Twice A Day. Please Dispense 90g Tube 3)  Amlodipine Besy-Benazepril Hcl 10-20 Mg  Caps (Amlodipine Besy-Benazepril Hcl) .... One Tab By Mouth Daily 4)  Spironolactone 25 Mg Tabs (Spironolactone) .... One Tab By Mouth Daily 5)  Coumadin 5 Mg Tabs (Warfarin Sodium) .... Take As Directed By Coumadin Clinic. 6)  Acetaminophen 650 Mg  Tbcr (Acetaminophen) .... One Tab By Mouth Q4-Q6 Hours As Needed For Pain 7)  Multivitamins   Tabs (Multiple Vitamin) .Marland Kitchen.. 1 Tab Once Daily 8)  Os-Cal 500 + D 500-200 Mg-Unit Tabs (Calcium Carbonate-Vitamin D) .Marland Kitchen.. 1 Tab Daily 9)  Omeprazole 20 Mg Cpdr (Omeprazole) .... One Tab By Mouth Daily 10)  Celebrex 200 Mg Caps (Celecoxib) .... Take 2 On Day #1, and Then One Daily For 7 Days Then One Daily As Needed.  Allergies (verified): 1)  ! Penicillin 2)  ! Morphine  Anticoagulation Management History:      The patient is taking warfarin and comes in today for a routine follow up visit.  Positive risk factors for bleeding include an age of 75 years or older.  Negative risk factors for bleeding include no history of CVA/TIA.  The bleeding index is 'intermediate risk'.  Positive  CHADS2 values include History of CHF, History of HTN, and Age > 75 years old.  Negative CHADS2 values include History of Diabetes and Prior Stroke/CVA/TIA.  The start date was 09/05/2003.  Anticoagulation responsible provider: Jens Som MD, Arlys John.  INR POC: 3.5.  Cuvette Lot#: [id number].  Exp: 09/2010.    Anticoagulation Management Assessment/Plan:      The patient's current anticoagulation dose is Coumadin 5 mg tabs: Take as directed by coumadin clinic..  The target INR is 2 - 3.  The next INR is due 09/28/2009.  Anticoagulation instructions were given to patient.  Results were reviewed/authorized by Leota Sauers, PharmD, BCPS, CPP.         Prior Anticoagulation Instructions: INR 3.4  Skip tomorrow's (Friday) dose.  Then resume taking 1.5 tablets (7.5mg ) every day except take 1 tablet (5mg ) on Sundays and Fridays.  Recheck in 3 weeks.   Current Anticoagulation Instructions: INR 3.5  No Coumadin today Thur 9/1 then Coumadin 1 tab = 5mg  on Mon, Wed, Fri and 1 and 1/2 tab = 7.5mg  on Tue, Thur, Sat and Sun

## 2010-02-13 NOTE — Assessment & Plan Note (Signed)
Summary: 6 MO F/U  Medications Added LASIX 40 MG TABS (FUROSEMIDE) 1 tab once daily        Visit Type:  6 mo f/u Primary Provider:  [name]  MD  CC:  sob....edema.ankle/left mostly.  History of Present Illness: Bianca Tuckerreturns today for evaluation management of her right-sided congestive heart failure, atrial fibrillation, anticoagulation and hypertension.  She offers no complaints today. She occasionally will get some congestion in the morning when she wakes up but denies orthopnea. She does have a history of reflux. There is usually a clear liquid her mouth.  She has had no edema. She denies any palpitations or syncope. She has no bleeding or melena reported.  Her recent blood work in December was reviewed. Hemoglobin was 13.6, blood sugar 103, creatinine 0.75, potassium 4.4 and LFTs were normal. Her last 2-D echocardiogram was February 23, 2008. History of normal left ventricular systolic function, no Danikah Budzik motion abnormalities, no LVH, normal right-sided chambers and function, and no significant valve disease, only mild mitral and tricuspid regurgitation.  Current Medications (verified): 1)  Lasix 40 Mg Tabs (Furosemide) .Marland Kitchen.. 1 Tab Once Daily 2)  Lotrisone 1-0.05 %  Crea (Clotrimazole-Betamethasone) .... Apply Generous Amount To Affected Areas Twice A Day. Please Dispense 90g Tube 3)  Amlodipine Besy-Benazepril Hcl 10-20 Mg  Caps (Amlodipine Besy-Benazepril Hcl) .... One Tab By Mouth Daily 4)  Spironolactone 25 Mg Tabs (Spironolactone) .... One Tab By Mouth Daily 5)  Coumadin 5 Mg Tabs (Warfarin Sodium) .... Take As Directed By Coumadin Clinic. 6)  Acetaminophen 650 Mg  Tbcr (Acetaminophen) .... One Tab By Mouth Q4-Q6 Hours As Needed For Pain 7)  Multivitamins   Tabs (Multiple Vitamin) .Marland Kitchen.. 1 Tab Once Daily 8)  Os-Cal 500 + D 500-200 Mg-Unit Tabs (Calcium Carbonate-Vitamin D) .Marland Kitchen.. 1 Tab Daily 9)  Omeprazole 20 Mg Cpdr (Omeprazole) .... One Tab By Mouth Daily 10)  Celebrex  200 Mg Caps (Celecoxib) .... Take 2 On Day #1, and Then One Daily For 7 Days Then One Daily As Needed.  Allergies: 1)  ! Penicillin 2)  ! Morphine  Past History:  Past Medical History: Last updated: 11/30/2008 Per cardiology NO MORE B BLOCKERS OR AV NODE DRUGS 2 to cardiac pause on Holter study. HTN A. Fib diastolic dysfunction CHF Obesity intertrigo Hx of carpal tunnel bilateral ( s/p sx) OA knee (bilateral) -GERD (h pylori positive) -h/o benign colon polyps s/p cholescystectomy  Past Surgical History: Last updated: 03/07/2008 -24 hours Holter that showed two 2.2 secs pauses. Per cardiology NO MORE B BLOCKERS OR AV NODE DRUGS. -B carpal tunnel surgeries  - colonoscopy 6/09 polyps. repeat 10 years - s/p cholescystectomy - s/p complete hysterectomy -s/p partial medial and lateral meniscectomies on R knee, R knee chondroplasty 11/2001 -s/p L knee arthroscopic partial medial and lateral meniscectomies, partial synovectomy 08/1999 -ECHO 02/2008 EF 55%,  There was dyssynergic motion of the interventricular septum, consistent with a conduction abnormality or paced rhythm. There was mild mitral annular calcification. There was mild         mitral valvular regurgitation. The left atrium was moderately dilated. The right atrium was moderately dilated  Family History: Last updated: 08/05/2008 Mother -- died at 79 from colon ca, siblings w/ HTN, DM, mother also had asthma  Social History: Last updated: 10/28/2007 widowed. No etoh, tob, or IVDA.; Jehovah's Witness--No Blood (POA Document in chart); Does NOT want to be organ donor; Advanced directive - states no blood products unless approved by herself or POA;  Pt does not want life prolonged if situation is hopeless. now living alone.  Pt has 11  children ( 3 sons, 8 girls), 9 of them alive.  Risk Factors: Smoking Status: never (06/14/2009)  Review of Systems       negative other than history of present illness  Vital  Signs:  Patient profile:   77 year old female Height:      66 inches Weight:      234 pounds BMI:     37.91 Pulse rate:   72 / minute Pulse rhythm:   regular BP sitting:   110 / 70  (left arm) Cuff size:   large  Vitals Entered By: Danielle Rankin, CMA (June 29, 2009 10:35 AM)  Physical Exam  General:  obese.  obese.   Head:  normocephalic and atraumatic Eyes:  PERRLA/EOM intact; conjunctiva and lids normal. Neck:  Neck supple, no JVD. No masses, thyromegaly or abnormal cervical nodes. Lungs:  Clear bilaterally to auscultation and percussion. Heart:  PMI difficult to appreciate, irregular rate and rhythm, soft S1-S2, no Msk:  decreased ROM.  decreased ROM.   Pulses:  pulses normal in all 4 extremities Extremities:  No clubbing or cyanosis. Neurologic:  Alert and oriented x 3. Skin:  Intact without lesions or rashes. Psych:  Normal affect.   Impression & Recommendations:  Problem # 1:  CHF (ICD-428.0) Assessment Improved  Her updated medication list for this problem includes:    Lasix 40 Mg Tabs (Furosemide) .Marland Kitchen... 1 tab once daily    Amlodipine Besy-benazepril Hcl 10-20 Mg Caps (Amlodipine besy-benazepril hcl) ..... One tab by mouth daily    Spironolactone 25 Mg Tabs (Spironolactone) ..... One tab by mouth daily    Coumadin 5 Mg Tabs (Warfarin sodium) .Marland Kitchen... Take as directed by coumadin clinic.  Problem # 2:  HYPERTENSION, BENIGN ESSENTIAL (ICD-401.1) Assessment: Improved  Her updated medication list for this problem includes:    Lasix 40 Mg Tabs (Furosemide) .Marland Kitchen... 1 tab once daily    Amlodipine Besy-benazepril Hcl 10-20 Mg Caps (Amlodipine besy-benazepril hcl) ..... One tab by mouth daily    Spironolactone 25 Mg Tabs (Spironolactone) ..... One tab by mouth daily  Problem # 3:  ATRIAL FIBRILLATION (ICD-427.31) Assessment: Unchanged  Her updated medication list for this problem includes:    Coumadin 5 Mg Tabs (Warfarin sodium) .Marland Kitchen... Take as directed by coumadin  clinic.  Problem # 4:  COUMADIN THERAPY (ICD-V58.61) Assessment: Unchanged  Problem # 5:  OBESITY, MODERATE (ICD-278.00) Assessment: Unchanged  Patient Instructions: 1)  Your physician recommends that you schedule a follow-up appointment in: YEAR WITH DR Aarit Kashuba 2)  Your physician recommends that you continue on your current medications as directed. Please refer to the Current Medication list given to you today.

## 2010-02-13 NOTE — Assessment & Plan Note (Signed)
Summary: Shoulder pain/dkf   Vital Signs:  Patient profile:   77 year old female Height:      98.5 inches Weight:      230.38 pounds BMI:     16.75 Temp:     98.3 degrees F oral Pulse rate:   84 / minute BP sitting:   120 / 78  (right arm) Cuff size:   large  Vitals Entered By: Jimmy Footman, CMA (November 22, 2009 10:27 AM) CC: chronic neck & shoulder pain Is Patient Diabetic? No Pain Assessment Patient in pain? yes     Location: neck & shoulder Intensity: 9 Type: sharp   Primary Care Provider:  Barnabas Lister  MD  CC:  chronic neck & shoulder pain.  History of Present Illness: 77 yo female with neck and shoulder pain for years.  Neck pain:  She has pain in the neck that radiates down both arms in a C5/C6/C7 distribution.  She has had XR in the past showing C5-C7 DDD.  She has had injections into her subacromial bursa in the past that she thinks have helped.  Pain is worse with all movements.  Better with rest.  Has tried tylenol, celebrex, and tramadol in the past with minimal and variable effectiveness.  No hx trauma, no fevers/chills, wt loss.    Habits & Providers  Alcohol-Tobacco-Diet     Tobacco Status: quit  Exercise-Depression-Behavior     Have you felt down or hopeless? no     Have you felt little pleasure in things? no     Depression Counseling: not indicated; screening negative for depression  Current Medications (verified): 1)  Lasix 40 Mg Tabs (Furosemide) .Marland Kitchen.. 1 Tab Once Daily 2)  Lotrisone 1-0.05 %  Crea (Clotrimazole-Betamethasone) .... Apply Generous Amount To Affected Areas Twice A Day. Please Dispense 90g Tube 3)  Amlodipine Besy-Benazepril Hcl 10-20 Mg  Caps (Amlodipine Besy-Benazepril Hcl) .... One Tab By Mouth Daily 4)  Spironolactone 25 Mg Tabs (Spironolactone) .... One Tab By Mouth Daily 5)  Coumadin 5 Mg Tabs (Warfarin Sodium) .... Take As Directed By Coumadin Clinic. 6)  Multivitamins   Tabs (Multiple Vitamin) .Marland Kitchen.. 1 Tab Once Daily 7)   Os-Cal 500 + D 500-200 Mg-Unit Tabs (Calcium Carbonate-Vitamin D) .Marland Kitchen.. 1 Tab Daily 8)  Omeprazole 20 Mg Cpdr (Omeprazole) .... One Tab By Mouth Daily 9)  Zostrix Arthritis Pain Relief 0.025 % Crea (Capsaicin) .... Apply To Affected Areas 2-3 Times Per Day.  Avoid Eyes, Mouth, Nose, Open Wounds. 10)  Ultracet 37.5-325 Mg Tabs (Tramadol-Acetaminophen) .... One Tabpo Q6h As Needed For Pain 11)  Prednisone (Pak) 10 Mg Tabs (Prednisone) .... Take As Directed 12)  Neurontin 300 Mg Caps (Gabapentin) .... One Tab By Mouth Three Times A Day For A Week, Then 2 Tabs Three Times A Day. Hold For Sedation.  Allergies (verified): 1)  ! Penicillin 2)  ! Morphine  Social History: Smoking Status:  quit  Review of Systems       See HPI  Physical Exam  General:  Well-developed,well-nourished,in no acute distress; alert,appropriate and cooperative throughout examination Lungs:  Normal respiratory effort, chest expands symmetrically. Lungs are clear to auscultation, no crackles or wheezes. Heart:  Normal rate and regular rhythm. S1 and S2 normal without gallop, murmur, click, rub or other extra sounds. Msk:  Bilateral shoulder: Inspection reveals no abnormalities, atrophy or asymmetry. Palpation is normal with no tenderness over AC joint or bicipital groove. ROM is full in all planes. Rotator cuff  strength normal throughout. No signs of impingement with negative Neer and Hawkin's tests, empty can. Speeds and Yergason's tests normal. No labral pathology noted with negative Obrien's, negative clunk and good stability. Normal scapular function observed. No painful arc and no drop arm sign. No apprehension sign  Neck:  ROM limited to flexion, ext, rotation, side bending in all directions.  No palpable step offs, Spurlings sign negative bilaterally.  No paraspinal muscle tenderness.    Sensation grossly intact to all dermatomes in upper ext.  No thenar/hypothenar atrophy.      Complete Medication  List: 1)  Lasix 40 Mg Tabs (Furosemide) .Marland Kitchen.. 1 tab once daily 2)  Lotrisone 1-0.05 % Crea (Clotrimazole-betamethasone) .... Apply generous amount to affected areas twice a day. please dispense 90g tube 3)  Amlodipine Besy-benazepril Hcl 10-20 Mg Caps (Amlodipine besy-benazepril hcl) .... One tab by mouth daily 4)  Spironolactone 25 Mg Tabs (Spironolactone) .... One tab by mouth daily 5)  Coumadin 5 Mg Tabs (Warfarin sodium) .... Take as directed by coumadin clinic. 6)  Multivitamins Tabs (Multiple vitamin) .Marland Kitchen.. 1 tab once daily 7)  Os-cal 500 + D 500-200 Mg-unit Tabs (Calcium carbonate-vitamin d) .Marland Kitchen.. 1 tab daily 8)  Omeprazole 20 Mg Cpdr (Omeprazole) .... One tab by mouth daily 9)  Zostrix Arthritis Pain Relief 0.025 % Crea (Capsaicin) .... Apply to affected areas 2-3 times per day.  avoid eyes, mouth, nose, open wounds. 10)  Ultracet 37.5-325 Mg Tabs (Tramadol-acetaminophen) .... One tabpo q6h as needed for pain 11)  Prednisone (pak) 10 Mg Tabs (Prednisone) .... Take as directed 12)  Neurontin 300 Mg Caps (Gabapentin) .... One tab by mouth three times a day for a week, then 2 tabs three times a day. hold for sedation.  Other Orders: [facility]- Est  Level 4 (16109)  Patient Instructions: 1)  You have cervical radiculopathy. 2)  Neurontin as directed. 3)  Ultracet for pain. 4)  Prednisone taper pack as directed. 5)  Exercises. 6)  Come back if no better in 2 weeks. 7)  -Dr. Karie Schwalbe. Prescriptions: NEURONTIN 300 MG CAPS (GABAPENTIN) One tab by mouth three times a day for a week, then 2 tabs three times a day. Hold for sedation.  #90 x 0   Entered and Authorized by:   Rodney Langton MD   Signed by:   Rodney Langton MD on 11/22/2009   Method used:   Print then Give to Patient   RxID:   [id number] PREDNISONE (PAK) 10 MG TABS (PREDNISONE) Take as directed  #12 day taper x 0   Entered and Authorized by:   Rodney Langton MD   Signed by:   Rodney Langton MD on 11/22/2009    Method used:   Print then Give to Patient   RxID:   [id number] ULTRACET 37.5-325 MG TABS (TRAMADOL-ACETAMINOPHEN) One tabPO q6h as needed for pain  #60 x 0   Entered and Authorized by:   Rodney Langton MD   Signed by:   Rodney Langton MD on 11/22/2009   Method used:   Print then Give to Patient   RxID:   [id number]    Orders Added: 1)  [facility]- Est  Level 4 [25366]

## 2010-02-13 NOTE — Medication Information (Signed)
Summary: rov/tm   Anticoagulant Therapy  Managed by: Weston Brass, PharmD Referring MD: Valera Castle MD PCP: [name]  MD Supervising MD: Gala Romney MD, Reuel Boom Indication 1: Atrial Fibrillation (ICD-427.31) Lab Used: [facility] Site: [facility] INR RANGE 2 - 3  Dietary changes: no    Health status changes: no    Bleeding/hemorrhagic complications: no    Recent/future hospitalizations: no    Any changes in medication regimen? yes       Details: Began Celebrex for shoulder in July.  No long taking.  Also took some abx for the "cold" x 5 days in July.    Recent/future dental: no  Any missed doses?: no       Is patient compliant with meds? yes       Allergies: 1)  ! Penicillin 2)  ! Morphine  Anticoagulation Management History:      The patient is taking warfarin and comes in today for a routine follow up visit.  Positive risk factors for bleeding include an age of 75 years or older.  Negative risk factors for bleeding include no history of CVA/TIA.  The bleeding index is 'intermediate risk'.  Positive CHADS2 values include History of CHF, History of HTN, and Age > 34 years old.  Negative CHADS2 values include History of Diabetes and Prior Stroke/CVA/TIA.  The start date was 09/05/2003.  Anticoagulation responsible provider: Bensimhon MD, Reuel Boom.  Cuvette Lot#: [id number].  Exp: 09/2010.    Anticoagulation Management Assessment/Plan:      The patient's current anticoagulation dose is Coumadin 5 mg tabs: Take as directed by coumadin clinic..  The target INR is 2 - 3.  The next INR is due 09/14/2009.  Anticoagulation instructions were given to patient.  Results were reviewed/authorized by Weston Brass, PharmD.  She was notified by Gweneth Fritter, PharmD Candidate.         Prior Anticoagulation Instructions: INR 2.7 Continue 7.5mg s everyday except 5mg s on Sundays and Fridays. REcheck in 4 weeks.   Current Anticoagulation Instructions: INR 3.4  Skip tomorrow's (Friday) dose.   Then resume taking 1.5 tablets (7.5mg ) every day except take 1 tablet (5mg ) on Sundays and Fridays.  Recheck in 3 weeks.

## 2010-02-13 NOTE — Assessment & Plan Note (Signed)
Summary: geri assessement,tcb   Vital Signs:  Patient profile:   77 year old female Height:      66 inches Weight:      233.4 pounds BMI:     37.81 Temp:     98.1 degrees F oral Pulse rate:   81 / minute Pulse rhythm:   regular BP sitting:   116 / 76  (left arm) Cuff size:   large  Vitals Entered By: Loralee Pacas CMA (March 23, 2009 9:21 AM)  Primary Care Provider:  [name]  MD  CC:  geri assessment.  History of Present Illness: 77 y/o female here for geri assessment  c/o R hand deformity- occasional soreness. some wasting in interspace between R thumb and 1st digit. looks like "bone sticking out." reports h/o what sounds like EMG testing of RUE following ?nerve injury during surgery years ago. reports intermittent neck pain with radiation of pain to R forearm.   SOB- improved since hospitalization for CHF exacerbation. patient obtained a friend's deceased husband's motorized chair, but has been unable to use 2/2 lack of batteries. denies chest pain, peripheral edema.   Current Medications (verified): 1)  Lasix 40 Mg Tabs (Furosemide) .... Take 1and 1/2  Tablet By Mouth Once A Day 2)  Lotrisone 1-0.05 %  Crea (Clotrimazole-Betamethasone) .... Apply Generous Amount To Affected Areas Twice A Day. Please Dispense 60g Tube 3)  Amlodipine Besy-Benazepril Hcl 10-20 Mg  Caps (Amlodipine Besy-Benazepril Hcl) .... One Tab By Mouth Daily 4)  Tramadol Hcl 50 Mg  Tabs (Tramadol Hcl) .... One Tab By Mouth Q6 As Needed Pain 5)  Spironolactone 25 Mg Tabs (Spironolactone) .... One Tab By Mouth Daily 6)  Coumadin 5 Mg Tabs (Warfarin Sodium) .... Take As Directed By Coumadin Clinic. 7)  Acetaminophen 650 Mg  Tbcr (Acetaminophen) .... As Needed 8)  Multivitamins   Tabs (Multiple Vitamin) .Marland Kitchen.. 1 Tab Once Daily 9)  Os-Cal 500 + D 500-200 Mg-Unit Tabs (Calcium Carbonate-Vitamin D) .Marland Kitchen.. 1 Tab Daily 10)  Omeprazole 20 Mg Cpdr (Omeprazole) .... One Tab By Mouth Daily  Allergies  (verified): 1)  ! Penicillin 2)  ! Morphine  Past History:  Past medical, surgical, family and social histories (including risk factors) reviewed, and no changes noted (except as noted below).  Past Medical History: Reviewed history from 11/30/2008 and no changes required. Per cardiology NO MORE B BLOCKERS OR AV NODE DRUGS 2 to cardiac pause on Holter study. HTN A. Fib diastolic dysfunction CHF Obesity intertrigo Hx of carpal tunnel bilateral ( s/p sx) OA knee (bilateral) -GERD (h pylori positive) -h/o benign colon polyps s/p cholescystectomy  Past Surgical History: Reviewed history from 03/07/2008 and no changes required. -24 hours Holter that showed two 2.2 secs pauses. Per cardiology NO MORE B BLOCKERS OR AV NODE DRUGS. -B carpal tunnel surgeries  - colonoscopy 6/09 polyps. repeat 10 years - s/p cholescystectomy - s/p complete hysterectomy -s/p partial medial and lateral meniscectomies on R knee, R knee chondroplasty 11/2001 -s/p L knee arthroscopic partial medial and lateral meniscectomies, partial synovectomy 08/1999 -ECHO 02/2008 EF 55%,  There was dyssynergic motion of the interventricular septum, consistent with a conduction abnormality or paced rhythm. There was mild mitral annular calcification. There was mild         mitral valvular regurgitation. The left atrium was moderately dilated. The right atrium was moderately dilated  Family History: Reviewed history from 07/06/2008 and no changes required. Mother -- died at 26 from colon ca, siblings w/ HTN, DM,  mother also had asthma  Social History: Reviewed history from 10/28/2007 and no changes required. widowed. No etoh, tob, or IVDA.; Jehovah's Witness--No Blood (POA Document in chart); Does NOT want to be organ donor; Advanced directive - states no blood products unless approved by herself or POA; Pt does not want life prolonged if situation is hopeless. now living alone.  Pt has 11  children ( 3 sons, 8 girls), 9 of  them alive.  Physical Exam  General:  elderly female, in NAD. vitals reviewed.  Eyes:  EOMI, PERRLA Mouth:  MMM Neck:  no JVD or bruits Msk:  full ROM of bilateral shoulders ulnar deviation of fingers of R hand with significant weakness of lumbricals of R hand. wasting in interspace between R thumb and first digit.  Neurologic:  alert & oriented X3 and cranial nerves II-XII intact. Inability to spread fingers of right hand. Psych:  Oriented X3, memory intact for recent and remote, normally interactive, and good eye contact.     Geriatric Assessment:  Activities of Daily Living:    Bathing-independent    Dressing-independent    Eating-independent    Toileting-independent    Transferring-independent    Continence-independent  Instrumental Activities of Daily Living:    Transportation-independent    Meal/Food Preparation-independent    Shopping Errands-independent    Housekeeping/Chores-independent    Money Management/Finances-independent    Medication Management-independent    Ability to Use Telephone-independent    Laundry-independent  Mental Status Exam: (value/max value)    Orientation to Time: 5/5    Orientation to Place: 5/5    Registration: 3/3    Attention/Calculation: 5/5    Recall: 3/3    Language-name 2 objects: 2/2    Language-repeat: 1/1    Language-follow 3-step command: 3/3    Language-read and follow direction: 1/1    Write a sentence: 1/1    Copy design: 1/1 MSE Total score: 30/30  Balance: (value/max value)    Sitting balance: 1/1    Arises: 1/2    Attempts to arise: 2/2    Immediate standing balance: 2/2    Standing balance: 1/1    Nudged: 1/2    Eyes closed: 1/1    360 degree turn: 1/1    Sitting down: 2/2 Balance Total Score: 12/14  Gait: (value/max value)    Initiation of gait: 1/1    Step length-left: 1/1    Step height-left: 1/1    Step length-right: 1/1    Step height-right: 1/1    Step symmetry: 1/1    Step continuity: 1/1     Path: 1/2    Trunk: 1/2    Walking stance: 1/1 Gait Total Score: 10/12  Balance + Gait Total Score: 22/26  >40 minutes in face to face time was spent with this patient with >50% spent in counseling.  Impression & Recommendations:  Problem # 1:  UNSPECIFIED MONONEURITIS OF UPPER LIMB (ICD-354.9) Assessment New  C spine xray in 2005 with degenerative changes at several levels with foraminal narrowing. patient would like to consider further imaging, and doesn't wish repeat xrays or MRI at this time. will call if symptoms worsen.   Orders: [facility]- Est  Level 4 (16109)  Problem # 2:  IMPAIRED GLUCOSE TOLERANCE (ICD-271.3) Assessment: Unchanged  patient very concerned about possibly developing diabetes due to fhx. fasting cap glucose 116. patient reassured that she is not currently diabetic.   Orders: [facility]- Est  Level 4 (60454)  Problem # 3:  CHF (ICD-428.0) Assessment: Improved  no changes.   Her updated medication list for this problem includes:    Lasix 40 Mg Tabs (Furosemide) .Marland Kitchen... Take 1and 1/2  tablet by mouth once a day    Amlodipine Besy-benazepril Hcl 10-20 Mg Caps (Amlodipine besy-benazepril hcl) ..... One tab by mouth daily    Spironolactone 25 Mg Tabs (Spironolactone) ..... One tab by mouth daily    Coumadin 5 Mg Tabs (Warfarin sodium) .Marland Kitchen... Take as directed by coumadin clinic.  Orders: [facility]- Est  Level 4 (16109)  Problem # 4:  ARTHRITIS, SHOULDERS, BILATERAL (ICD-716.91) Assessment: Improved no complaints since last injection.   Orders: [facility]- Est  Level 4 (60454)  Other Orders: Glucose Cap-[facility] (09811)  Patient Instructions: 1)  You can take UP TO THREE ADVIL every SIX HOURS

## 2010-02-13 NOTE — Medication Information (Signed)
Summary: rov/ewj  Anticoagulant Therapy  Managed by: Cloyde Reams, RN, BSN Referring MD: Valera Castle MD PCP: [name]  MD Supervising MD: Ladona Ridgel MD, Sharlot Gowda Indication 1: Atrial Fibrillation (ICD-427.31) Lab Used: [facility] Site: [facility] INR POC 2.2 INR RANGE 2 - 3  Dietary changes: no    Health status changes: no    Bleeding/hemorrhagic complications: no    Recent/future hospitalizations: no    Any changes in medication regimen? no    Recent/future dental: no  Any missed doses?: no       Is patient compliant with meds? yes       Allergies (verified): 1)  ! Penicillin 2)  ! Morphine  Anticoagulation Management History:      The patient is taking warfarin and comes in today for a routine follow up visit.  Positive risk factors for bleeding include an age of 37 years or older.  Negative risk factors for bleeding include no history of CVA/TIA.  The bleeding index is 'intermediate risk'.  Positive CHADS2 values include History of CHF, History of HTN, and Age > 92 years old.  Negative CHADS2 values include History of Diabetes and Prior Stroke/CVA/TIA.  The start date was 09/05/2003.  Anticoagulation responsible provider: Ladona Ridgel MD, Sharlot Gowda.  INR POC: 2.2.  Cuvette Lot#: [id number].  Exp: 05/2010.    Anticoagulation Management Assessment/Plan:      The patient's current anticoagulation dose is Coumadin 5 mg tabs: Take as directed by coumadin clinic..  The target INR is 2 - 3.  The next INR is due 05/11/2009.  Anticoagulation instructions were given to patient.  Results were reviewed/authorized by Cloyde Reams, RN, BSN.  She was notified by Cloyde Reams RN.         Prior Anticoagulation Instructions: INR 2.5  Continue on same dosage 1.5 tablets except 1 tablet on Sundays and Fridays.  Recheck in 4 weeks.    Current Anticoagulation Instructions: INR 2.2  Continue on same dosage 1.5 tablets daily except 1 tablet on Sundays and Fridays.  Recheck in 4 weeks.

## 2010-02-13 NOTE — Assessment & Plan Note (Signed)
Summary: shoulder/arm,df   Vital Signs:  Patient profile:   77 year old female Weight:      236.3 pounds Pulse rate:   86 / minute BP sitting:   138 / 93  (left arm) Cuff size:   large  Vitals Entered By: Arlyss Repress CMA, (Jun 01, 2009 9:07 AM) CC: bilateral shoulder pain. ...radiates into neck and elbow. tramadol helped some, but c/o nausea Is Patient Diabetic? No Pain Assessment Patient in pain? yes     Location: both shoulders Intensity: 10 Onset of pain  x 2 weeks   Primary Care Provider:  [name]  MD  CC:  bilateral shoulder pain. ...radiates into neck and elbow. tramadol helped some and but c/o nausea.  History of Present Illness: RIght shoulder pain for over one week.  Neck tight and hurting as well.  Has had this problem in the past and reports having an injection.  Hurts at night, has been using heat.  Used tramadol and it helped the pain but she was nauseated all day and could not eat.  ANitcoagulated for AF.  Habits & Providers  Alcohol-Tobacco-Diet     Tobacco Status: quit  Current Medications (verified): 1)  Lasix 40 Mg Tabs (Furosemide) .... Take 1and 1/2  Tablet By Mouth Once A Day 2)  Lotrisone 1-0.05 %  Crea (Clotrimazole-Betamethasone) .... Apply Generous Amount To Affected Areas Twice A Day. Please Dispense 60g Tube 3)  Amlodipine Besy-Benazepril Hcl 10-20 Mg  Caps (Amlodipine Besy-Benazepril Hcl) .... One Tab By Mouth Daily 4)  Tramadol Hcl 50 Mg  Tabs (Tramadol Hcl) .... One Tab By Mouth Q6 As Needed Pain 5)  Spironolactone 25 Mg Tabs (Spironolactone) .... One Tab By Mouth Daily 6)  Coumadin 5 Mg Tabs (Warfarin Sodium) .... Take As Directed By Coumadin Clinic. 7)  Acetaminophen 650 Mg  Tbcr (Acetaminophen) .... As Needed 8)  Multivitamins   Tabs (Multiple Vitamin) .Marland Kitchen.. 1 Tab Once Daily 9)  Os-Cal 500 + D 500-200 Mg-Unit Tabs (Calcium Carbonate-Vitamin D) .Marland Kitchen.. 1 Tab Daily 10)  Omeprazole 20 Mg Cpdr (Omeprazole) .... One Tab By Mouth  Daily 11)  Celebrex 200 Mg Caps (Celecoxib) .... Take 2 On Day #1, and Then One Daily For 7 Days Then One Daily As Needed.  Allergies (verified): 1)  ! Penicillin 2)  ! Morphine  Review of Systems General:  Denies chills and fever. MS:  Complains of joint pain and loss of strength.  Physical Exam  General:  Elderly female in good condition, obese, moved well Msk:  Full ROM of right shoulder, complained of it hurting about 100 degree extension but was able to fully extend activelly.  Posterior tenderness, negative pain with internal and external rotation.    Impression & Recommendations:  Problem # 1:  SHOULDER PAIN, RIGHT (ICD-719.41) Treat conservatively since she is anticoagualted, she had requested an injection.  Trial of Celebrex for 7 days then as needed.  Follow up with primary MD in 2 weeks.  If she is still having pain and wants an injection, she is to hold her coumadin for 2 days prior to the appointment.  WIll obtain Xray of cervical spine as it could be contributing . Her updated medication list for this problem includes:    Tramadol Hcl 50 Mg Tabs (Tramadol hcl) ..... One tab by mouth q6 as needed pain    Acetaminophen 650 Mg Tbcr (Acetaminophen) .Marland Kitchen... As needed    Celebrex 200 Mg Caps (Celecoxib) .Marland Kitchen... Take 2 on day #1,  and then one daily for 7 days then one daily as needed.  Orders: Radiology other (Radiology Other) [facility]- Est Level  3 ([address])  Complete Medication List: 1)  Lasix 40 Mg Tabs (Furosemide) .... Take 1and 1/2  tablet by mouth once a day 2)  Lotrisone 1-0.05 % Crea (Clotrimazole-betamethasone) .... Apply generous amount to affected areas twice a day. please dispense 60g tube 3)  Amlodipine Besy-benazepril Hcl 10-20 Mg Caps (Amlodipine besy-benazepril hcl) .... One tab by mouth daily 4)  Tramadol Hcl 50 Mg Tabs (Tramadol hcl) .... One tab by mouth q6 as needed pain 5)  Spironolactone 25 Mg Tabs (Spironolactone) .... One tab by mouth daily 6)  Coumadin 5 Mg  Tabs (Warfarin sodium) .... Take as directed by coumadin clinic. 7)  Acetaminophen 650 Mg Tbcr (Acetaminophen) .... As needed 8)  Multivitamins Tabs (Multiple vitamin) .Marland Kitchen.. 1 tab once daily 9)  Os-cal 500 + D 500-200 Mg-unit Tabs (Calcium carbonate-vitamin d) .Marland Kitchen.. 1 tab daily 10)  Omeprazole 20 Mg Cpdr (Omeprazole) .... One tab by mouth daily 11)  Celebrex 200 Mg Caps (Celecoxib) .... Take 2 on day #1, and then one daily for 7 days then one daily as needed.  Patient Instructions: 1)  Celebrex:  take 2 today then one daily for 7-10 days, then as needed. 2)  Return in 2 weeks with Dr. Lanier Prude 3)  If you want an injection at the follow up visit, hold your coumadin for 2 days, Prescriptions: CELEBREX 200 MG CAPS (CELECOXIB) Take 2 on day #1, and then one daily for 7 days then one daily as needed.  #30 x 1   Entered by:   Luretha Murphy NP   Authorized by:   Zachery Dauer MD   Signed by:   Luretha Murphy NP on 06/01/2009   Method used:   Electronically to        [facility] [address] [facility] (retail)       [address]       [place], [place]  [id number]       Ph: [phone]       Fax: [phone]   RxID:   [id number]

## 2010-02-13 NOTE — Medication Information (Signed)
Summary: rov/sp  Anticoagulant Therapy  Managed by: Weston Brass, PharmD Referring MD: Valera Castle MD PCP: [name]  MD Supervising MD: Myrtis Ser MD, Tinnie Gens Indication 1: Atrial Fibrillation (ICD-427.31) Lab Used: [facility] Site: [facility] INR POC 2.6 INR RANGE 2 - 3  Dietary changes: no    Health status changes: no    Bleeding/hemorrhagic complications: no    Recent/future hospitalizations: no    Any changes in medication regimen? no    Recent/future dental: no  Any missed doses?: no       Is patient compliant with meds? yes       Allergies: 1)  ! Penicillin 2)  ! Morphine  Anticoagulation Management History:      The patient is taking warfarin and comes in today for a routine follow up visit.  Positive risk factors for bleeding include an age of 75 years or older.  Negative risk factors for bleeding include no history of CVA/TIA.  The bleeding index is 'intermediate risk'.  Positive CHADS2 values include History of CHF, History of HTN, and Age > 75 years old.  Negative CHADS2 values include History of Diabetes and Prior Stroke/CVA/TIA.  The start date was 09/05/2003.  Anticoagulation responsible provider: Myrtis Ser MD, Tinnie Gens.  INR POC: 2.6.  Cuvette Lot#: [id number].  Exp: 08/2010.    Anticoagulation Management Assessment/Plan:      The patient's current anticoagulation dose is Coumadin 5 mg tabs: Take as directed by coumadin clinic..  The target INR is 2 - 3.  The next INR is due 07/27/2009.  Anticoagulation instructions were given to patient.  Results were reviewed/authorized by Weston Brass, PharmD.  She was notified by Weston Brass PharmD.         Prior Anticoagulation Instructions: INR 3.2  Skip tomorrow's dose of Coumadin then resume same dose of 1 1/2 tablet every day except 1 tablet on Sunday and Friday   Current Anticoagulation Instructions: INR 2.6  Continue same dose of 1 1/2 tablets every day except 1 tablet on Sunday and Friday.

## 2010-02-13 NOTE — Miscellaneous (Signed)
Summary: walk in   Clinical Lists Changes c/o sore throat & drainage x 4 days. getting worse. wants to be seen. appt with Dr. Lanier Prude. she is aware there may be a wait. VS obtained.Golden Circle RN  July 11, 2009 9:14 AM

## 2010-02-13 NOTE — Medication Information (Signed)
Summary: [facility]madin - [facility]  Anticoagulant Therapy  Managed by: Eda Keys, PharmD Referring MD: Valera Castle MD PCP: [name]  MD Supervising MD: Gala Romney MD, Reuel Boom Indication 1: Atrial Fibrillation (ICD-427.31) Lab Used: [facility] Site: [facility] INR POC 2.5 INR RANGE 2 - 3  Dietary changes: no    Health status changes: no    Bleeding/hemorrhagic complications: no    Recent/future hospitalizations: no    Any changes in medication regimen? no    Recent/future dental: no  Any missed doses?: no       Is patient compliant with meds? yes       Allergies: 1)  ! Penicillin 2)  ! Morphine  Anticoagulation Management History:      The patient is taking warfarin and comes in today for a routine follow up visit.  Positive risk factors for bleeding include an age of 75 years or older.  Negative risk factors for bleeding include no history of CVA/TIA.  The bleeding index is 'intermediate risk'.  Positive CHADS2 values include History of CHF, History of HTN, and Age > 6 years old.  Negative CHADS2 values include History of Diabetes and Prior Stroke/CVA/TIA.  The start date was 09/05/2003.  Anticoagulation responsible provider: Bensimhon MD, Reuel Boom.  INR POC: 2.5.  Cuvette Lot#: [id number].  Exp: 11/2010.    Anticoagulation Management Assessment/Plan:      The patient's current anticoagulation dose is Coumadin 5 mg tabs: Take as directed by coumadin clinic..  The target INR is 2 - 3.  The next INR is due 10/26/2009.  Anticoagulation instructions were given to patient.  Results were reviewed/authorized by Eda Keys, PharmD.  She was notified by Eda Keys.         Prior Anticoagulation Instructions: INR 3.5  No Coumadin today Thur 9/1 then Coumadin 1 tab = 5mg  on Mon, Wed, Fri and 1 and 1/2 tab = 7.5mg  on Tue, Thur, Sat and Sun  Current Anticoagulation Instructions: INR 2.5  Continue taking 1 tablet on Monday, Wednesday, and Friday, and 1.5 tablets all  other days.  Return to clinic in 1 month.

## 2010-02-13 NOTE — Medication Information (Signed)
Summary: Bianca [name]  Anticoagulant Therapy  Managed by: Cloyde Reams, RN, BSN Referring MD: Valera Castle MD PCP: [name]  MD Supervising MD: Jens Som MD, Arlys John Indication 1: Atrial Fibrillation (ICD-427.31) Lab Used: [facility] Site: [facility] INR POC 2.2 INR RANGE 2 - 3  Dietary changes: no    Health status changes: no    Bleeding/hemorrhagic complications: no    Recent/future hospitalizations: yes       Details: Pt was in hospital 12/11-12/16.  Any changes in medication regimen? no    Recent/future dental: no  Any missed doses?: no       Is patient compliant with meds? yes      Comments: Pt was on abx in hospital for UTI.    Current Medications (verified): 1)  Lasix 40 Mg Tabs (Furosemide) .... Take 1and 1/2  Tablet By Mouth Once A Day 2)  Lotrisone 1-0.05 %  Crea (Clotrimazole-Betamethasone) .... Apply Generous Amount To Affected Areas Twice A Day. Please Dispense 60g Tube 3)  Amlodipine Besy-Benazepril Hcl 10-20 Mg  Caps (Amlodipine Besy-Benazepril Hcl) .... One Tab By Mouth Daily 4)  Tramadol Hcl 50 Mg  Tabs (Tramadol Hcl) .... One Tab By Mouth Q6 As Needed Pain 5)  Spironolactone 25 Mg Tabs (Spironolactone) .... One Tab By Mouth Daily 6)  Coumadin 5 Mg Tabs (Warfarin Sodium) .... Take As Directed By Coumadin Clinic. 7)  Acetaminophen 650 Mg  Tbcr (Acetaminophen) .... As Needed 8)  Multivitamins   Tabs (Multiple Vitamin) .Marland Kitchen.. 1 Tab Once Daily 9)  Os-Cal 500 + D 500-200 Mg-Unit Tabs (Calcium Carbonate-Vitamin D) .Marland Kitchen.. 1 Tab Daily 10)  Omeprazole 20 Mg Cpdr (Omeprazole) .... One Tab By Mouth Daily 11)  Prochlorperazine 25 Mg Supp (Prochlorperazine) .... Insert One Suppository Rectally Twice A Day As Needed For Nausea  Allergies (verified): 1)  ! Penicillin 2)  ! Morphine  Anticoagulation Management History:      The patient is taking warfarin and comes in today for a routine follow up visit.  Positive risk factors for bleeding include an age of 2 years  or older.  Negative risk factors for bleeding include no history of CVA/TIA.  The bleeding index is 'intermediate risk'.  Positive CHADS2 values include History of CHF, History of HTN, and Age > 75 years old.  Negative CHADS2 values include History of Diabetes and Prior Stroke/CVA/TIA.  The start date was 09/05/2003.  Anticoagulation responsible provider: Jens Som MD, Arlys John.  INR POC: 2.2.  Cuvette Lot#: [id number].  Exp: 02/2010.    Anticoagulation Management Assessment/Plan:      The patient's current anticoagulation dose is Coumadin 5 mg tabs: Take as directed by coumadin clinic..  The target INR is 2 - 3.  The next INR is due 02/13/2009.  Anticoagulation instructions were given to patient.  Results were reviewed/authorized by Cloyde Reams, RN, BSN.  She was notified by Cloyde Reams RN.         Prior Anticoagulation Instructions: INR 2.7  Continue on same dosage 1.5 tablets daily except 1 tablet on Sundays and Fridays.   Recheck in 4 weeks.    Current Anticoagulation Instructions: INR 2.2  Continue on same dosage 1.5 tablets daily except 1 tablet on Sundays and Fridays.  Recheck in 4 weeks.

## 2010-02-15 NOTE — Assessment & Plan Note (Signed)
Summary: [facility]   Vital Signs:  Patient profile:   77 year old female Weight:      224 pounds Temp:     98 degrees F oral Pulse rate:   81 / minute Pulse rhythm:   regular BP sitting:   117 / 83  (right arm) Cuff size:   large  Vitals Entered By: Loralee Pacas CMA (January 31, 2010 1:40 PM) CC: weak Is Patient Diabetic? No Pain Assessment Patient in pain? no      Comments pt has been feeling weak for apprx 1 month   Primary Provider:  Barnabas Lister  MD  CC:  weak.  History of Present Illness: 77 yo female is here for f/u depressed mood.  Today she c/o weakness/fatigue that has been worsening since her last visit.  Says she cannot cook, clean, walk without stopping to take breaks.  This has been going on for years, but is getting progressively worse.  Pt not sleeping and appetite has decreased.  Has lost 6lbs in last 3 months.  She eats one meal per day.  Lost interest in daily activities.  Symptoms have all worsened due to recent events: daughter has been dx with depression and has moved out.  Pt expresses that she hates living alone.  PHQ9 - 6.   On an unrelated note, patient requesting Nexium for reflux.  ROS: endorses changes in sleep, interest, energy, and appetite.  Denies suicidal ideations.  Endorses fatigue, weakness, SOB.  Denies fever, CP, abdominal pain.  Denies diarrhea/constipation, dysuria.  Habits & Providers  Alcohol-Tobacco-Diet     Tobacco Status: quit     Tobacco Counseling: not indicated; no tobacco use     Year Quit: 1970  Exercise-Depression-Behavior     Have you felt down or hopeless? yes     Have you felt little pleasure in things? no     Depression Counseling: further diagnostic testing and/or other treatment is indicated     Seat Belt Use: quit  Comments: worrying alot about her daughter because she is depressed  Current Medications (verified): 1)  Lasix 40 Mg Tabs (Furosemide) .Marland Kitchen.. 1 Tab Once Daily 2)  Lotrisone 1-0.05 %  Crea  (Clotrimazole-Betamethasone) .... Apply Generous Amount To Affected Areas Twice A Day. Please Dispense 90g Tube 3)  Amlodipine Besy-Benazepril Hcl 10-20 Mg  Caps (Amlodipine Besy-Benazepril Hcl) .... One Tab By Mouth Daily 4)  Spironolactone 25 Mg Tabs (Spironolactone) .... One Tab By Mouth Daily 5)  Coumadin 5 Mg Tabs (Warfarin Sodium) .... Take As Directed By Coumadin Clinic. 6)  Multivitamins   Tabs (Multiple Vitamin) .Marland Kitchen.. 1 Tab Once Daily 7)  Os-Cal 500 + D 500-200 Mg-Unit Tabs (Calcium Carbonate-Vitamin D) .Marland Kitchen.. 1 Tab Daily 8)  Omeprazole 20 Mg Cpdr (Omeprazole) .... One Tab By Mouth Daily 9)  Zostrix Arthritis Pain Relief 0.025 % Crea (Capsaicin) .... Apply To Affected Areas 2-3 Times Per Day.  Avoid Eyes, Mouth, Nose, Open Wounds. 10)  Ultracet 37.5-325 Mg Tabs (Tramadol-Acetaminophen) .... One Tabpo Q6h As Needed For Pain 11)  Prednisone (Pak) 10 Mg Tabs (Prednisone) .... Take As Directed 12)  Neurontin 300 Mg Caps (Gabapentin) .... One Tab By Mouth Three Times A Day For A Week, Then 2 Tabs Three Times A Day. Hold For Sedation. 13)  Nexium 20 Mg Pack (Esomeprazole Magnesium) .... Take Once Daily. 14)  Remeron 15 Mg Tabs (Mirtazapine) .... Take One Tablet By Mouth Once Daily.  Allergies (verified): 1)  ! Penicillin 2)  !  Morphine  Past History:  Past Medical History: Last updated: 11/30/2008 Per cardiology NO MORE B BLOCKERS OR AV NODE DRUGS 2 to cardiac pause on Holter study. HTN A. Fib diastolic dysfunction CHF Obesity intertrigo Hx of carpal tunnel bilateral ( s/p sx) OA knee (bilateral) -GERD (h pylori positive) -h/o benign colon polyps s/p cholescystectomy  Family History: Reviewed history from 07/06/2008 and no changes required. Mother -- died at 56 from colon ca, siblings w/ HTN, DM, mother also had asthma  Social History: Reviewed history from 10/27/2009 and no changes required. widowed, lives alone in elderly community, daughter recently moved out and dx with  depression.  No etoh, tob, or IVDA.; Jehovah's Witness--No Blood (POA Document in chart); Does NOT want to be organ donor; Advanced directive - states no blood products unless approved by herself or POA; Pt does not want life prolonged if situation is hopeless Pt has 11  children ( 3 sons, 8 girls), 9 of them alive.Seat Belt Use:  quit  Review of Systems       per HPI  Physical Exam  General:  alert, well-nourished, and healthy-appearing.   Head:  normocephalic and atraumatic.   Lungs:  Normal respiratory effort, chest expands symmetrically. Lungs are clear to auscultation, no crackles or wheezes. Heart:  Normal rate and regular rhythm. S1 and S2 normal without gallop, murmur, click, rub or other extra sounds. Abdomen:  Bowel sounds positive,abdomen soft and non-tender without masses, organomegaly or hernias noted. Pulses:  R radial normal and L radial normal.   Extremities:  No clubbing, cyanosis, edema. Neurologic:  alert & oriented X3 and cranial nerves II-XII intact.   Psych:  depressed affect.     Impression & Recommendations:  Problem # 1:  FATIGUE (ICD-780.79) Could be secondary to depression.  Will get a CBC, TSH, and BMET to rule out any other etiology.  If patient's symptoms do not improve, may consider further work up for potential malignancy.  She denies any dark or bloody stools.  Denies any dysuria, DUB.  Will attempt to treat depression now and see if this improves fatigue.  Pt may qualify for a group home facility or home health worker to help with ADLs.  May need to refer to [facility] clinic for further recommendations.  Orders: Basic Met-[facility] [id number]) CBC-[facility] (61607) TSH-[facility] [phone]) [facility]- Est Level  3 ([address]27)  Problem # 2:  DEPRESSIVE DISORDER (ICD-311) PHQ score: 6.  Discussed option of meeting with Dr. Pascal Lux.  Pt declined.  Wants to try medication first.  Will start Remeron 15mg  daily to 1) improve mood and 2) stimulate appetite.  Pt is to follow up with me in  3-4 weeks and call me if she experiences any side effects.  May consider switching to Prozac if Remeron fails.  Will follow closely.  Her updated medication list for this problem includes:    Remeron 15 Mg Tabs (Mirtazapine) .Marland Kitchen... Take one tablet by mouth once daily.  Orders: [facility]- Est Level  3 (03500)  Patient Instructions: 1)  It was good to see you today. 2)  Please schedule appt in 3-4 weeks for follow-up. 3)  Please take Nexium as directed for reflux. 4)  Please take Remeron as directed for mood. 5)  Call MD if you have any side effects, questions, concerns. 6)  Thank you. Prescriptions: REMERON 15 MG TABS (MIRTAZAPINE) Take one tablet by mouth once daily.  #30 x 0   Entered and Authorized by:   Ivy de Lawson Radar  MD  Signed by:   Barnabas Lister  MD on 01/31/2010   Method used:   Electronically to        [facility] (retail)       [address]       [place], [place]  [id number]       Ph: [phone]       Fax: [phone]   RxID:   [id number] NEXIUM 20 MG PACK (ESOMEPRAZOLE MAGNESIUM) Take once daily.  #30 x 0   Entered and Authorized by:   Ivy de Lawson Radar  MD   Signed by:   Barnabas Lister  MD on 01/31/2010   Method used:   Electronically to        [facility] [address] [facility] (retail)       [address]       [place], [place]  [id number]       Ph: [phone]       Fax: [phone]   RxID:   [id number]    Orders Added: 1)  Basic Met-[facility] [09323-55732] 2)  CBC-[facility] [85027] 3)  TSH-[facility] [[id number]] 4)  [facility]- Est Level  3 [37628]

## 2010-02-15 NOTE — Miscellaneous (Signed)
Summary: refill   Clinical Lists Changes  Medications: Rx of AMLODIPINE BESY-BENAZEPRIL HCL 10-20 MG  CAPS (AMLODIPINE BESY-BENAZEPRIL HCL) one tab by mouth daily;  #30 x 2;  Signed;  Entered by: Theresia Lo RN;  Authorized by: Ivy de Lawson Radar  MD;  Method used: Electronically to [facility] [address], [place], [place]  [id number], Ph: [phone], Fax: [phone]    Prescriptions: AMLODIPINE BESY-BENAZEPRIL HCL 10-20 MG  CAPS (AMLODIPINE BESY-BENAZEPRIL HCL) one tab by mouth daily  #30 x 2   Entered by:   Theresia Lo RN   Authorized by:   Ivy de Lawson Radar  MD   Signed by:   Theresia Lo RN on 02/02/2010   Method used:   Electronically to        [facility] (retail)       [address]       [place], [place]  [id number]       Ph: [phone]       Fax: [phone]   RxID:   [id number]

## 2010-02-15 NOTE — Letter (Signed)
Summary: Generic Letter  [facility]  [address]   [place], [place] [address]   Phone: [phone]  Fax: [phone]    02/03/2010  [facility] [address] [place], [place]  [address]  Dear Ms. Wnuk,  I hope you are doing well. The results of you blood work have returned and everything was normal.  Your blood, electrolytes, and thyroid levels all look great.    If you continue to experience increasing weakness or fatigue, please schedule another appointment with me to discuss further tests.  It is important that you return to see me in 3 weeks to re-evaluate  your new medication for your mood.    Thanks.    Sincerely,   Ivy de Lawson Radar  MD   Appended Document: Generic Letter mailed

## 2010-02-15 NOTE — Medication Information (Signed)
Summary: rov/tm   Anticoagulant Therapy  Managed by: Weston Brass, PharmD Referring MD: Valera Castle MD PCP: Barnabas Lister  MD Supervising MD: Daleen Squibb MD, Maisie Fus Indication 1: Atrial Fibrillation (ICD-427.31) Lab Used: [facility] Site: [facility] INR POC 2.6 INR RANGE 2 - 3  Dietary changes: no    Health status changes: yes       Details: fell at apt a few weeks ago.  Seeing orthopedic MD  Bleeding/hemorrhagic complications: no    Recent/future hospitalizations: no    Any changes in medication regimen? no    Recent/future dental: no  Any missed doses?: no       Is patient compliant with meds? yes       Allergies: 1)  ! Penicillin 2)  ! Morphine  Anticoagulation Management History:      The patient is taking warfarin and comes in today for a routine follow up visit.  Positive risk factors for bleeding include an age of 1 years or older.  Negative risk factors for bleeding include no history of CVA/TIA.  The bleeding index is 'intermediate risk'.  Positive CHADS2 values include History of CHF, History of HTN, and Age > 59 years old.  Negative CHADS2 values include History of Diabetes and Prior Stroke/CVA/TIA.  The start date was 09/05/2003.  Anticoagulation responsible provider: Daleen Squibb MD, Maisie Fus.  INR POC: 2.6.  Cuvette Lot#: [id number].  Exp: 02/2011.    Anticoagulation Management Assessment/Plan:      The patient's current anticoagulation dose is Coumadin 5 mg tabs: Take as directed by coumadin clinic..  The target INR is 2 - 3.  The next INR is due 02/26/2010.  Anticoagulation instructions were given to patient.  Results were reviewed/authorized by Weston Brass, PharmD.  She was notified by Weston Brass PharmD.         Prior Anticoagulation Instructions: INR 3.1 Tomorrow skip dose then resume 1.5 pills everyday except 1 pill on Mondays, Wednesdays and Fridays. Recheck in 4 weeks.   Current Anticoagulation Instructions: INR 2.6  Continue same dose of 1 1/2 tablets every day  except 1 tablet on Monday, Wednesday and Friday.  Recheck INR in 4 weeks.

## 2010-02-15 NOTE — Medication Information (Signed)
Summary: rov/nb  Anticoagulant Therapy  Managed by: Bethena Midget, RN, BSN Referring MD: Valera Castle MD PCP: Barnabas Lister  MD Supervising MD: Tenny Craw MD, Gunnar Fusi Indication 1: Atrial Fibrillation (ICD-427.31) Lab Used: [facility] Site: [facility] INR POC 3.1 INR RANGE 2 - 3  Dietary changes: yes       Details: Poor appetite having frequent diarrhea, will f/u wtih GI MD today   Health status changes: no    Bleeding/hemorrhagic complications: no    Recent/future hospitalizations: no    Any changes in medication regimen? no    Recent/future dental: no  Any missed doses?: no       Is patient compliant with meds? yes       Allergies: 1)  ! Penicillin 2)  ! Morphine  Anticoagulation Management History:      The patient is taking warfarin and comes in today for a routine follow up visit.  Positive risk factors for bleeding include an age of 41 years or older.  Negative risk factors for bleeding include no history of CVA/TIA.  The bleeding index is 'intermediate risk'.  Positive CHADS2 values include History of CHF, History of HTN, and Age > 35 years old.  Negative CHADS2 values include History of Diabetes and Prior Stroke/CVA/TIA.  The start date was 09/05/2003.  Anticoagulation responsible provider: Tenny Craw MD, Gunnar Fusi.  INR POC: 3.1.  Cuvette Lot#: [id number].  Exp: 01/2011.    Anticoagulation Management Assessment/Plan:      The patient's current anticoagulation dose is Coumadin 5 mg tabs: Take as directed by coumadin clinic..  The target INR is 2 - 3.  The next INR is due 01/29/2010.  Anticoagulation instructions were given to patient.  Results were reviewed/authorized by Bethena Midget, RN, BSN.  She was notified by Bethena Midget, RN, BSN.         Prior Anticoagulation Instructions: INR 2.5 Continue previous dose of 1.5 tablets everyday except 1 tablet on Monday, Wednesday, and Friday Recheck INR in 4 weeks  Current Anticoagulation Instructions: INR 3.1 Tomorrow skip dose then  resume 1.5 pills everyday except 1 pill on Mondays, Wednesdays and Fridays. Recheck in 4 weeks.

## 2010-02-16 NOTE — Miscellaneous (Signed)
Summary: Procedure Consent  Procedure Consent   Imported By: Bradly Bienenstock 06/15/2009 11:55:08  _____________________________________________________________________  External Attachment:    Type:   Image     Comment:   External Document

## 2010-02-21 NOTE — Assessment & Plan Note (Signed)
Summary: flu sx,df   Vital Signs:  Patient profile:   77 year old female Height:      98.5 inches Weight:      221 pounds BMI:     16.07 Temp:     98.9 degrees F oral BP sitting:   110 / 68  (left arm) Cuff size:   regular  Vitals Entered By: Tessie Fass CMA (February 12, 2010 3:19 PM) CC: flu symptoms x 3 days Pain Assessment Patient in pain? no        Primary Care Provider:  Barnabas Lister  MD  CC:  flu symptoms x 3 days.  History of Present Illness: 77 yo F:  1. Cold S/S: Patient c/o runny nose, mild sore throat, and cough x a few days. Cough is dry. No fever/chills, HA, dizziness, ear pain, N/V/D, LE edema, CP, SOB, rash, abdominal pain. No sick contacts. No smoke exposure. No appetite, but not new.  Current Medications (verified): 1)  Lasix 40 Mg Tabs (Furosemide) .Marland Kitchen.. 1 Tab Once Daily 2)  Lotrisone 1-0.05 %  Crea (Clotrimazole-Betamethasone) .... Apply Generous Amount To Affected Areas Twice A Day. Please Dispense 90g Tube 3)  Amlodipine Besy-Benazepril Hcl 10-20 Mg  Caps (Amlodipine Besy-Benazepril Hcl) .... One Tab By Mouth Daily 4)  Spironolactone 25 Mg Tabs (Spironolactone) .... One Tab By Mouth Daily 5)  Coumadin 5 Mg Tabs (Warfarin Sodium) .... Take As Directed By Coumadin Clinic. 6)  Multivitamins   Tabs (Multiple Vitamin) .Marland Kitchen.. 1 Tab Once Daily 7)  Os-Cal 500 + D 500-200 Mg-Unit Tabs (Calcium Carbonate-Vitamin D) .Marland Kitchen.. 1 Tab Daily 8)  Omeprazole 20 Mg Cpdr (Omeprazole) .... One Tab By Mouth Daily 9)  Zostrix Arthritis Pain Relief 0.025 % Crea (Capsaicin) .... Apply To Affected Areas 2-3 Times Per Day.  Avoid Eyes, Mouth, Nose, Open Wounds. 10)  Ultracet 37.5-325 Mg Tabs (Tramadol-Acetaminophen) .... One Tabpo Q6h As Needed For Pain 11)  Prednisone (Pak) 10 Mg Tabs (Prednisone) .... Take As Directed 12)  Neurontin 300 Mg Caps (Gabapentin) .... One Tab By Mouth Three Times A Day For A Week, Then 2 Tabs Three Times A Day. Hold For Sedation. 13)  Nexium 20 Mg Pack  (Esomeprazole Magnesium) .... Take Once Daily. 14)  Remeron 15 Mg Tabs (Mirtazapine) .... Take One Tablet By Mouth Once Daily.  Allergies (verified): 1)  ! Penicillin 2)  ! Morphine PMH-FH-SH reviewed for relevance  Review of Systems      See HPI  Physical Exam  General:  Well-developed,well-nourished,in no acute distress; alert,appropriate and cooperative throughout examination. Vitals reviewed. Eyes:  No injection. Ears:  R ear normal and L ear normal.   Nose:  Clear nasal discharge, mucosal erythema and mucosal edema.   Mouth:  Oral mucosa and oropharynx without lesions or exudates.  PND. Neck:  No deformities, masses, or tenderness noted. Lungs:  Normal respiratory effort, chest expands symmetrically. Lungs are clear to auscultation, no crackles or wheezes. Heart:  Normal rate and regular rhythm. S1 and S2 normal without gallop, murmur, click, rub or other extra sounds. Abdomen:  Bowel sounds positive,abdomen soft and non-tender without masses, organomegaly or hernias noted. Pulses:  2 +DP. Extremities:  No clubbing, cyanosis, edema. Psych:  Flat affect.   Impression & Recommendations:  Problem # 1:  UPPER RESPIRATORY INFECTION, VIRAL (ICD-465.9) Assessment New No red flags today. Symptomatic care. Precautions given. Orders: [facility]- Est Level  3 (78295)  Problem # 2:  LOSS OF WEIGHT (ICD-783.21) Assessment: Deteriorated Followed already  by PCP. Recently Rx Remeron, but is not taking - "I took it before and didn't like it." Orders: [facility]- Est Level  3 ([id number])  Complete Medication List: 1)  Lasix 40 Mg Tabs (Furosemide) .Marland Kitchen.. 1 tab once daily 2)  Lotrisone 1-0.05 % Crea (Clotrimazole-betamethasone) .... Apply generous amount to affected areas twice a day. please dispense 90g tube 3)  Amlodipine Besy-benazepril Hcl 10-20 Mg Caps (Amlodipine besy-benazepril hcl) .... One tab by mouth daily 4)  Spironolactone 25 Mg Tabs (Spironolactone) .... One tab by mouth daily 5)  Coumadin  5 Mg Tabs (Warfarin sodium) .... Take as directed by coumadin clinic. 6)  Multivitamins Tabs (Multiple vitamin) .Marland Kitchen.. 1 tab once daily 7)  Os-cal 500 + D 500-200 Mg-unit Tabs (Calcium carbonate-vitamin d) .Marland Kitchen.. 1 tab daily 8)  Omeprazole 20 Mg Cpdr (Omeprazole) .... One tab by mouth daily 9)  Zostrix Arthritis Pain Relief 0.025 % Crea (Capsaicin) .... Apply to affected areas 2-3 times per day.  avoid eyes, mouth, nose, open wounds. 10)  Ultracet 37.5-325 Mg Tabs (Tramadol-acetaminophen) .... One tabpo q6h as needed for pain 11)  Prednisone (pak) 10 Mg Tabs (Prednisone) .... Take as directed 12)  Neurontin 300 Mg Caps (Gabapentin) .... One tab by mouth three times a day for a week, then 2 tabs three times a day. hold for sedation. 13)  Nexium 20 Mg Pack (Esomeprazole magnesium) .... Take once daily. 14)  Remeron 15 Mg Tabs (Mirtazapine) .... Take one tablet by mouth once daily.  Patient Instructions: 1)  It was nice to meet you today! 2)  You have a viral illness. 3)  Take care of yourself - eat regularly, drink hot tea with honey. I don't recommend over-the-counter medicines for you. 4)  Make sure to let Dr. Domenick Bookbinder know that you are not taking the Remeron.   Orders Added: 1)  [facility]- Est Level  3 [[id number]]

## 2010-02-28 ENCOUNTER — Encounter: Payer: Self-pay | Admitting: Cardiology

## 2010-02-28 ENCOUNTER — Encounter (INDEPENDENT_AMBULATORY_CARE_PROVIDER_SITE_OTHER): Payer: PRIVATE HEALTH INSURANCE

## 2010-02-28 ENCOUNTER — Ambulatory Visit (INDEPENDENT_AMBULATORY_CARE_PROVIDER_SITE_OTHER): Payer: PRIVATE HEALTH INSURANCE | Admitting: Cardiology

## 2010-02-28 ENCOUNTER — Encounter: Payer: Self-pay | Admitting: Internal Medicine

## 2010-02-28 DIAGNOSIS — I5022 Chronic systolic (congestive) heart failure: Secondary | ICD-10-CM

## 2010-02-28 DIAGNOSIS — Z7901 Long term (current) use of anticoagulants: Secondary | ICD-10-CM

## 2010-02-28 DIAGNOSIS — I4891 Unspecified atrial fibrillation: Secondary | ICD-10-CM

## 2010-03-07 NOTE — Assessment & Plan Note (Signed)
Summary: patient c/o falling due to coumadin/sl/ep  Medications Added REMERON 15 MG TABS (MIRTAZAPINE) Take one tablet by mouth once daily. at night as needed      Allergies Added:   Visit Type:  Follow-up Primary Provider:  Barnabas Lister  MD  CC:  sob, edema in ankles, and pt fell in Jan 2012. pt does not have current med list today...verified verbally.Marland Kitchen  History of Present Illness: MS Ragan comes in today for management of her CAF, anticoagulation, and Chronic Diastolic CHF. Her biggest concern is falling again. She missed a step but did not hit her head. We have talked again about the importance of getting immediate evaluation if she hits her head. If she has more falls we may have to stop her Coumadin.  Her edema has been stable. Her blood pressure has been good. She did not take her BP meds this am.  Current Medications (verified): 1)  Lasix 40 Mg Tabs (Furosemide) .Marland Kitchen.. 1 Tab Once Daily 2)  Lotrisone 1-0.05 %  Crea (Clotrimazole-Betamethasone) .... Apply Generous Amount To Affected Areas Twice A Day. Please Dispense 90g Tube 3)  Amlodipine Besy-Benazepril Hcl 10-20 Mg  Caps (Amlodipine Besy-Benazepril Hcl) .... One Tab By Mouth Daily 4)  Spironolactone 25 Mg Tabs (Spironolactone) .... One Tab By Mouth Daily 5)  Coumadin 5 Mg Tabs (Warfarin Sodium) .... Take As Directed By Coumadin Clinic. 6)  Multivitamins   Tabs (Multiple Vitamin) .Marland Kitchen.. 1 Tab Once Daily 7)  Os-Cal 500 + D 500-200 Mg-Unit Tabs (Calcium Carbonate-Vitamin D) .Marland Kitchen.. 1 Tab Daily 8)  Zostrix Arthritis Pain Relief 0.025 % Crea (Capsaicin) .... Apply To Affected Areas 2-3 Times Per Day.  Avoid Eyes, Mouth, Nose, Open Wounds. 9)  Ultracet 37.5-325 Mg Tabs (Tramadol-Acetaminophen) .... One Tabpo Q6h As Needed For Pain 10)  Nexium 20 Mg Pack (Esomeprazole Magnesium) .... Take Once Daily. 11)  Remeron 15 Mg Tabs (Mirtazapine) .... Take One Tablet By Mouth Once Daily. At Night As Needed  Allergies (verified): 1)  !  Penicillin 2)  ! Morphine  Past History:  Past Medical History: Last updated: 11/30/2008 Per cardiology NO MORE B BLOCKERS OR AV NODE DRUGS 2 to cardiac pause on Holter study. HTN A. Fib diastolic dysfunction CHF Obesity intertrigo Hx of carpal tunnel bilateral ( s/p sx) OA knee (bilateral) -GERD (h pylori positive) -h/o benign colon polyps s/p cholescystectomy  Past Surgical History: Last updated: 03/07/2008 -24 hours Holter that showed two 2.2 secs pauses. Per cardiology NO MORE B BLOCKERS OR AV NODE DRUGS. -B carpal tunnel surgeries  - colonoscopy 6/09 polyps. repeat 10 years - s/p cholescystectomy - s/p complete hysterectomy -s/p partial medial and lateral meniscectomies on R knee, R knee chondroplasty 11/2001 -s/p L knee arthroscopic partial medial and lateral meniscectomies, partial synovectomy 08/1999 -ECHO 02/2008 EF 55%,  There was dyssynergic motion of the interventricular septum, consistent with a conduction abnormality or paced rhythm. There was mild mitral annular calcification. There was mild         mitral valvular regurgitation. The left atrium was moderately dilated. The right atrium was moderately dilated  Family History: Last updated: Aug 03, 2008 Mother -- died at 55 from colon ca, siblings w/ HTN, DM, mother also had asthma  Social History: Last updated: 10/27/2009 widowed, lives alone in elderly community, daughter recently moved out and dx with depression.  No etoh, tob, or IVDA.; Jehovah's Witness--No Blood (POA Document in chart); Does NOT want to be organ donor; Advanced directive - states no blood products  unless approved by herself or POA; Pt does not want life prolonged if situation is hopeless Pt has 11  children ( 3 sons, 8 girls), 9 of them alive.  Risk Factors: Smoking Status: quit (01/31/2010)  Review of Systems       negative other than HPI  Vital Signs:  Patient profile:   77 year old female Height:      98.5 inches Weight:       230.50 pounds BMI:     16.76 Pulse rate:   90 / minute Resp:     18 per minute BP sitting:   142 / 92  (left arm) Cuff size:   large  Vitals Entered By: Celestia Khat, CMA (February 28, 2010 10:19 AM)  Physical Exam  General:  obese.   Head:  normocephalic and atraumatic Eyes:  PERRLA/EOM intact; conjunctiva and lids normal. Neck:  Neck supple, no JVD. No masses, thyromegaly or abnormal cervical nodes. Chest Khamauri Bauernfeind:  no deformities or breast masses noted Lungs:  Clear bilaterally to auscultation and percussion. Heart:  PMI POORLY APPRECIATED, SOFT S1 and S2, No bruits. Msk:  decreased ROM.   Pulses:  pulses normal in all 4 extremities Extremities:  1+ left pedal edema and 1+ right pedal edema.   Neurologic:  Alert and oriented x 3. Skin:  Intact without lesions or rashes. Psych:  Normal affect.   Impression & Recommendations:  Problem # 1:  CHRONIC DIASTOLIC HEART FAILURE (ICD-428.32) Assessment Unchanged  Her updated medication list for this problem includes:    Lasix 40 Mg Tabs (Furosemide) .Marland Kitchen... 1 tab once daily    Amlodipine Besy-benazepril Hcl 10-20 Mg Caps (Amlodipine besy-benazepril hcl) ..... One tab by mouth daily    Spironolactone 25 Mg Tabs (Spironolactone) ..... One tab by mouth daily    Coumadin 5 Mg Tabs (Warfarin sodium) .Marland Kitchen... Take as directed by coumadin clinic.  Problem # 2:  ATRIAL FIBRILLATION (ICD-427.31) Assessment: Unchanged  Her updated medication list for this problem includes:    Coumadin 5 Mg Tabs (Warfarin sodium) .Marland Kitchen... Take as directed by coumadin clinic.  Problem # 3:  COUMADIN THERAPY (ICD-V58.61) Assessment: Unchanged  Appended Document: patient c/o falling due to coumadin/sl/ep    Clinical Lists Changes  Orders: Added new Service order of EKG w/ Interpretation (93000) - Signed Observations: Added new observation of PI CARDIO: Your physician recommends that you schedule a follow-up appointment in: 1 year with Dr. Daleen Squibb Your  physician recommends that you continue on your current medications as directed. Please refer to the Current Medication list given to you today. (02/28/2010 10:57)       Patient Instructions: 1)  Your physician recommends that you schedule a follow-up appointment in: 1 year with Dr. Daleen Squibb 2)  Your physician recommends that you continue on your current medications as directed. Please refer to the Current Medication list given to you today.

## 2010-03-07 NOTE — Medication Information (Signed)
Summary: Coumadin Clinic   Anticoagulant Therapy  Managed by: Georgina Pillion, PharmD Referring MD: Valera Castle MD PCP: Barnabas Lister  MD Supervising MD: Gala Romney MD, Reuel Boom Indication 1: Atrial Fibrillation (ICD-427.31) Lab Used: [facility]  Site: [facility] INR POC 2.0 INR RANGE 2 - 3  Dietary changes: no    Health status changes: no    Bleeding/hemorrhagic complications: no    Recent/future hospitalizations: no    Any changes in medication regimen? no    Recent/future dental: no  Any missed doses?: no       Is patient compliant with meds? yes       Allergies: 1)  ! Penicillin 2)  ! Morphine  Anticoagulation Management History:      Positive risk factors for bleeding include an age of 18 years or older.  Negative risk factors for bleeding include no history of CVA/TIA.  The bleeding index is 'intermediate risk'.  Positive CHADS2 values include History of CHF, History of HTN, and Age > 53 years old.  Negative CHADS2 values include History of Diabetes and Prior Stroke/CVA/TIA.  The start date was 09/05/2003.  Anticoagulation responsible provider: Bensimhon MD, Reuel Boom.  INR POC: 2.0.  Exp: 02/2011.    Anticoagulation Management Assessment/Plan:      The patient's current anticoagulation dose is Coumadin 5 mg tabs: Take as directed by coumadin clinic..  The target INR is 2 - 3.  The next INR is due 03/28/2010.  Anticoagulation instructions were given to patient.  Results were reviewed/authorized by Georgina Pillion, PharmD.         Prior Anticoagulation Instructions: INR 2.6  Continue same dose of 1 1/2 tablets every day except 1 tablet on Monday, Wednesday and Friday.  Recheck INR in 4 weeks.   Current Anticoagulation Instructions: Continue current regimen of 1 1/2 tablets (7.5 mg) daily EXCEPT for 1 tablet (5 mg) on Mondays, Wednesdays, and Fridays.  INR 2.0

## 2010-03-09 ENCOUNTER — Ambulatory Visit (INDEPENDENT_AMBULATORY_CARE_PROVIDER_SITE_OTHER): Payer: PRIVATE HEALTH INSURANCE | Admitting: Family Medicine

## 2010-03-09 ENCOUNTER — Encounter: Payer: Self-pay | Admitting: Family Medicine

## 2010-03-09 DIAGNOSIS — F329 Major depressive disorder, single episode, unspecified: Secondary | ICD-10-CM

## 2010-03-09 DIAGNOSIS — F3289 Other specified depressive episodes: Secondary | ICD-10-CM

## 2010-03-09 DIAGNOSIS — R5381 Other malaise: Secondary | ICD-10-CM

## 2010-03-09 MED ORDER — ESOMEPRAZOLE MAGNESIUM 20 MG PO PACK: 20.0000 mg | PACK | Freq: Every day | ORAL | Status: DC

## 2010-03-09 NOTE — Patient Instructions (Signed)
It was great to see you. Please continue to take new medication for sleep and appetite. Your recent lab work looks normal.   You may want to see your heart doctor about your weakness. I would recommend using a walker or cane if you continue to lose your balance or fall. If you experience any side effects or if your symptoms do not improve, please return to clinic. Thanks and have a great weekend.

## 2010-03-11 ENCOUNTER — Encounter: Payer: Self-pay | Admitting: Family Medicine

## 2010-03-11 NOTE — Assessment & Plan Note (Signed)
Patient is tolerating Remeron 15mg  daily.  Will continue current regimen.  Regarding patient's complaint of weakness/fatigue, recommended patient follow up with cardiologist.  Symptoms could be due to depressive disorder, but recommend that patient be evaluated by cardiologist.  Will follow up in 2-3 months.  Patient to call or return to clinic if she experiences any adverse effects from Remeron.

## 2010-03-11 NOTE — Assessment & Plan Note (Signed)
Patient's complaints of fatigue and weakness have not improved on Remeron.  This could be due to patient's chronic heart disorders.  Recent CBC and TSH were WNL.  Last CXR in Jan. 2012 significant for pulmonary edema. Pt denies any melena or hemoptysis.  Recommended that patient see her cardiologist for symptoms of fatigue/weakness.  Patient may benefit from a [facility] clinic referral.

## 2010-03-11 NOTE — Progress Notes (Signed)
  Subjective:    Patient ID: Bianca [name], female    DOB: 09/29/1930, 77 y.o.   MRN: [id number]  HPI  Patient is here for follow-up anti-drepressant therapy.  She has had a 3-4 month history of depressed mood, loss of energy, difficulty sleeping, and decreased appetite.  She has experienced the loss of a loved one (sister) 2 months ago and her daughter was recently diagnosed with depression.  Last month, patient c/o weakness and loss of appetite and sleep.  Started patient on Remeron.  Patient says she has been sleeping much better.  However, she still does not eat much.  Snacks throughout the day, but does not eat full meals.  Still c/o weakness and lack of energy.  Patient has a hx of atrial fib and CHF, followed by cardiologist.  Review of Systems  Constitutional: Positive for activity change, appetite change, fatigue and unexpected weight change. Negative for fever and chills.  Respiratory: Positive for shortness of breath. Negative for cough and chest tightness.   Cardiovascular: Negative for chest pain.  Gastrointestinal: Negative for nausea, vomiting, abdominal pain, diarrhea, constipation and blood in stool.  Genitourinary: Negative for dysuria.  Neurological: Positive for weakness. Negative for dizziness.       Objective:   Physical Exam  Constitutional: She is oriented to person, place, and time. She appears well-developed and well-nourished.  Cardiovascular: Normal pulses.  An irregularly irregular rhythm present.  Pulmonary/Chest: Effort normal and breath sounds normal.  Neurological: She is alert and oriented to person, place, and time. No cranial nerve deficit or sensory deficit.  Psychiatric: Her behavior is normal. Thought content normal. Cognition and memory are normal.          Assessment & Plan:

## 2010-03-12 DIAGNOSIS — I4891 Unspecified atrial fibrillation: Secondary | ICD-10-CM

## 2010-03-28 ENCOUNTER — Encounter (INDEPENDENT_AMBULATORY_CARE_PROVIDER_SITE_OTHER): Payer: PRIVATE HEALTH INSURANCE

## 2010-03-28 ENCOUNTER — Encounter: Payer: Self-pay | Admitting: Cardiology

## 2010-03-28 DIAGNOSIS — Z7901 Long term (current) use of anticoagulants: Secondary | ICD-10-CM

## 2010-03-28 DIAGNOSIS — I4891 Unspecified atrial fibrillation: Secondary | ICD-10-CM

## 2010-04-03 NOTE — Medication Information (Signed)
Summary: Bianca [name]  Anticoagulant Therapy  Managed by: Leota Sauers, PharmD, BCPS, CPP Referring MD: Valera Castle MD PCP: Barnabas Lister  MD Supervising MD: Riley Kill MD, Maisie Fus Indication 1: Atrial Fibrillation (ICD-427.31) Lab Used: [facility] Site: [facility] INR POC 2.2 INR RANGE 2 - 3  Dietary changes: no    Health status changes: no    Bleeding/hemorrhagic complications: no    Recent/future hospitalizations: no    Any changes in medication regimen? no    Recent/future dental: no  Any missed doses?: no       Is patient compliant with meds? yes      Comments: little nose bleeding with cold last week, now improved  Allergies (verified): 1)  ! Penicillin 2)  ! Morphine  Anticoagulation Management History:      The patient is taking warfarin and comes in today for a routine follow up visit.  Positive risk factors for bleeding include an age of 75 years or older.  Negative risk factors for bleeding include no history of CVA/TIA.  The bleeding index is 'intermediate risk'.  Positive CHADS2 values include History of CHF, History of HTN, and Age > 42 years old.  Negative CHADS2 values include History of Diabetes and Prior Stroke/CVA/TIA.  The start date was 09/05/2003.  Anticoagulation responsible provider: Riley Kill MD, Maisie Fus.  INR POC: 2.2.  Cuvette Lot#: [id number].  Exp: 02/2011.    Anticoagulation Management Assessment/Plan:      The patient's current anticoagulation dose is Coumadin 5 mg tabs: Take as directed by coumadin clinic..  The target INR is 2 - 3.  The next INR is due 04/25/2010.  Anticoagulation instructions were given to patient.  Results were reviewed/authorized by Leota Sauers, PharmD, BCPS, CPP.         Prior Anticoagulation Instructions: Continue current regimen of 1 1/2 tablets (7.5 mg) daily EXCEPT for 1 tablet (5 mg) on Mondays, Wednesdays, and Fridays.  INR 2.0  Current Anticoagulation Instructions: INR 2.2  Coumadin 5mg  tabs take 1 tab MON, WED,  FRI 1.5 tab on TUE, THUR, SAT, SUN

## 2010-04-06 LAB — GLUCOSE, CAPILLARY: Glucose-Capillary: 116 mg/dL — ABNORMAL HIGH (ref 70–99)

## 2010-04-16 ENCOUNTER — Other Ambulatory Visit: Payer: Self-pay | Admitting: Family Medicine

## 2010-04-16 DIAGNOSIS — Z1231 Encounter for screening mammogram for malignant neoplasm of breast: Secondary | ICD-10-CM

## 2010-04-17 LAB — POCT CARDIAC MARKERS
CKMB, poc: <1 ng/mL — ABNORMAL LOW (ref 1.0–8.0)
Troponin i, poc: <0.05 ng/mL (ref 0.00–0.09)

## 2010-04-17 LAB — URINE CULTURE: Colony Count: NO GROWTH

## 2010-04-17 LAB — URINALYSIS, ROUTINE W REFLEX MICROSCOPIC
Glucose, UA: NEGATIVE mg/dL
Protein, ur: NEGATIVE mg/dL
Specific Gravity, Urine: 1.023 (ref 1.005–1.030)

## 2010-04-17 LAB — CBC
HCT: 35.5 % — ABNORMAL LOW (ref 36.0–46.0)
HCT: 36.8 % (ref 36.0–46.0)
HCT: 38.3 % (ref 36.0–46.0)
HCT: 40.5 % (ref 36.0–46.0)
Hemoglobin: 11.8 g/dL — ABNORMAL LOW (ref 12.0–15.0)
Hemoglobin: 12.6 g/dL (ref 12.0–15.0)
Hemoglobin: 13.4 g/dL (ref 12.0–15.0)
MCV: 88.8 fL (ref 78.0–100.0)
MCV: 88.8 fL (ref 78.0–100.0)
Platelets: 225 10*3/uL (ref 150–400)
RBC: 3.99 MIL/uL (ref 3.87–5.11)
RBC: 4.15 MIL/uL (ref 3.87–5.11)
RBC: 4.33 MIL/uL (ref 3.87–5.11)
RDW: 15 % (ref 11.5–15.5)
WBC: 12.8 10*3/uL — ABNORMAL HIGH (ref 4.0–10.5)
WBC: 22.9 10*3/uL — ABNORMAL HIGH (ref 4.0–10.5)
WBC: 9.8 10*3/uL (ref 4.0–10.5)

## 2010-04-17 LAB — BASIC METABOLIC PANEL
Chloride: 100 mEq/L (ref 96–112)
Chloride: 106 mEq/L (ref 96–112)
Creatinine, Ser: 0.71 mg/dL (ref 0.4–1.2)
GFR calc Af Amer: >60 mL/min (ref >60)
GFR calc Af Amer: >60 mL/min (ref >60)
GFR calc Af Amer: >60 mL/min (ref >60)
GFR calc non Af Amer: >60 mL/min (ref >60)
GFR calc non Af Amer: >60 mL/min (ref >60)
Glucose, Bld: 104 mg/dL — ABNORMAL HIGH (ref 70–99)
Potassium: 3.7 mEq/L (ref 3.5–5.1)
Potassium: 3.9 mEq/L (ref 3.5–5.1)
Potassium: 4.1 mEq/L (ref 3.5–5.1)
Sodium: 139 mEq/L (ref 135–145)
Sodium: 140 mEq/L (ref 135–145)

## 2010-04-17 LAB — DIFFERENTIAL
Eosinophils Absolute: 0.5 10*3/uL (ref 0.0–0.7)
Eosinophils Relative: 4 % (ref 0–5)
Lymphocytes Relative: 11 % — ABNORMAL LOW (ref 12–46)
Lymphs Abs: 2.5 10*3/uL (ref 0.7–4.0)
Lymphs Abs: 3 10*3/uL (ref 0.7–4.0)
Monocytes Relative: 2 % — ABNORMAL LOW (ref 3–12)
Monocytes Relative: 6 % (ref 3–12)
Neutro Abs: 19.4 10*3/uL — ABNORMAL HIGH (ref 1.7–7.7)
Neutrophils Relative %: 85 % — ABNORMAL HIGH (ref 43–77)

## 2010-04-17 LAB — PROTIME-INR
INR: 2.01 — ABNORMAL HIGH (ref 0.00–1.49)
INR: 2.11 — ABNORMAL HIGH (ref 0.00–1.49)
INR: 2.58 — ABNORMAL HIGH (ref 0.00–1.49)
Prothrombin Time: 24.1 seconds — ABNORMAL HIGH (ref 11.6–15.2)
Prothrombin Time: 27.5 seconds — ABNORMAL HIGH (ref 11.6–15.2)

## 2010-04-17 LAB — URINE MICROSCOPIC-ADD ON

## 2010-04-17 LAB — COMPREHENSIVE METABOLIC PANEL
Albumin: 3.7 g/dL (ref 3.5–5.2)
Alkaline Phosphatase: 78 U/L (ref 39–117)
BUN: 12 mg/dL (ref 6–23)
Chloride: 103 mEq/L (ref 96–112)
Potassium: 3.3 mEq/L — ABNORMAL LOW (ref 3.5–5.1)
Total Bilirubin: 1.1 mg/dL (ref 0.3–1.2)

## 2010-04-17 LAB — CULTURE, BLOOD (ROUTINE X 2)

## 2010-04-17 LAB — BRAIN NATRIURETIC PEPTIDE: Pro B Natriuretic peptide (BNP): 184 pg/mL — ABNORMAL HIGH (ref 0.0–100.0)

## 2010-04-24 ENCOUNTER — Other Ambulatory Visit: Payer: Self-pay | Admitting: *Deleted

## 2010-04-24 DIAGNOSIS — I5032 Chronic diastolic (congestive) heart failure: Secondary | ICD-10-CM

## 2010-04-24 MED ORDER — SPIRONOLACTONE 25 MG PO TABS: 25.0000 mg | ORAL_TABLET | Freq: Every day | ORAL | Status: DC

## 2010-04-25 ENCOUNTER — Ambulatory Visit (INDEPENDENT_AMBULATORY_CARE_PROVIDER_SITE_OTHER): Payer: PRIVATE HEALTH INSURANCE | Admitting: *Deleted

## 2010-04-25 DIAGNOSIS — Z7901 Long term (current) use of anticoagulants: Secondary | ICD-10-CM

## 2010-04-25 DIAGNOSIS — I4891 Unspecified atrial fibrillation: Secondary | ICD-10-CM

## 2010-04-25 LAB — POCT INR: INR: 1.8

## 2010-04-27 ENCOUNTER — Ambulatory Visit
Admission: RE | Admit: 2010-04-27 | Discharge: 2010-04-27 | Disposition: A | Discharge status: Home / Self Care | Payer: PRIVATE HEALTH INSURANCE | Source: Ambulatory Visit | Attending: Family Medicine | Admitting: Family Medicine

## 2010-04-27 DIAGNOSIS — Z1231 Encounter for screening mammogram for malignant neoplasm of breast: Secondary | ICD-10-CM

## 2010-04-28 ENCOUNTER — Other Ambulatory Visit: Payer: Self-pay | Admitting: Sports Medicine

## 2010-05-01 NOTE — Telephone Encounter (Signed)
Refill request for patient of Dr.de la Sondra Come

## 2010-05-16 ENCOUNTER — Ambulatory Visit (INDEPENDENT_AMBULATORY_CARE_PROVIDER_SITE_OTHER): Payer: PRIVATE HEALTH INSURANCE | Admitting: *Deleted

## 2010-05-16 DIAGNOSIS — I4891 Unspecified atrial fibrillation: Secondary | ICD-10-CM

## 2010-05-16 LAB — POCT INR: INR: 2.7

## 2010-05-21 ENCOUNTER — Encounter: Payer: Self-pay | Admitting: Home Health Services

## 2010-05-29 NOTE — Assessment & Plan Note (Signed)
[name]                            CARDIOLOGY OFFICE NOTE   NAME:Bianca [name], Bianca [name]                        MRN:          [id number]  DATE:09/12/2006                            DOB:          1930/05/21    Bianca [name] returns today per Dr. Jacqulyn Bath recommendation.   A 2D echocardiogram was obtained by Dr. Ludwig Clarks on August 05, 2006. This  showed normal left ventricular chamber size, mild to moderate left  ventricular hypertrophy, left atrial enlargement, mild elevation in  pulmonary pressures, mild to moderate aortic root dilatation, EF of  about 50% with mild global hypokinesia, mild to moderate pulmonary  regurgitation, and right atrial enlargement.   She started her on Metoprolol 12.5 mg p.o. b.i.d. to prevent heart  failure. She was aware of the fact that she sometimes get slow and feels  very tired on these drugs.   She stopped her Lotrel.   Since doing this, Bianca [name] says that her blood pressure has been very high.  She is 182/112 today with a heart rate of 75 and irregular. EKG shows  atrial fibrillation, left axis deviation, and incomplete right bundle.  Diffused ST segment changes with no change as seen before.   PHYSICAL EXAMINATION:  HEENT:  Normocephalic atraumatic, PERRLA,  extraocular movements intact, she wears glasses, facial symmetry is  normal.  NECK:  Carotids are full. There is no JVD. Thyroid is not enlarged.  Trachea is midline.  LUNGS:  Clear.  HEART:  Irregular rate and rhythm, well controlled rate however. There  is no gallop. She has a soft systolic murmur along the left sternal  border. I could hear no diastolic component.  ABDOMEN:  Soft with good bowel sounds. Organomegaly could not be  assessed secondary to weight.  EXTREMITIES:  Trace edema, pulses are intact.  NEUROLOGIC:  Intact.   We obtained a Holter monitor on Bianca [name] off of beta blockers,  11/06/2005. She had 2 pulses. The longest 2.2 seconds. Her heart rate  averaged about 90 beats-per-minute, and her minimum heart rate was 42  beats-per-minute. We decided at that point in time not to use beta  blockers or AV nodal slowing drugs.   ASSESSMENT:  I have had a long talk with Bianca [name] today. Her blood  pressure is clearly not under control off of Lotrel. I think that this  is the most important thing to prevent further LVH or any LV dilatation  down the road. It will also reduce her risk of stroke, coronary disease,  as well as kidney disease. I am really concerned about being on a low  dose beta blocker or any AV nodal drug. When this was tried in the past,  she said that it almost killed her.   She is also falling on occasion. She fell again since I have seen her at  least once. I am very concerned about her being on Coumadin as well. I  discussed this with her and her daughter today.   PLAN:  1. Restart Lotrel 10/20 daily.  2. Stop Metoprolol.  3. Continue current medications otherwise.  4. See me back in 4 to 6 weeks. If she continues to fall, I will      discontinue her Coumadin and put her on 325 mg of aspirin daily.     Thomas C. Daleen Squibb, MD, [name]  Electronically Signed    TCW/MedQ  DD: 09/12/2006  DT: 09/14/2006  Job #: [id number]   cc:   Bianca Medal, MD

## 2010-05-29 NOTE — Assessment & Plan Note (Signed)
[facility]                            CARDIOLOGY OFFICE NOTE   NAME:[name], Bianca RUNNER                        MRN:          [id number]  DATE:07/29/2007                            DOB:          [birth year]    Bianca [name] returns today.  She has been under a lot of stress lately and  is going to be moving by herself.  She had been living with her  daughter.   She seems to be fairly compliant with diet.  Her medications, she also  seems to be compliant with.   PROBLEM LIST:  1. Chronic atrial fibrillation.  She has a history of intolerance to      beta-blockers with significant bradycardia.  2. Anticoagulation.  She had some falls in the past, but has not      fallen since I last saw her in October 2008.  We will continue with      anticoagulation.  3. Difficult to control hypertension.  4. Mild-to-moderate left ventricular hypertrophy by 2-D      echocardiography.  5. Mild-to-moderate aortic root dilatation with no significant aortic      regurgitation.  6. Mild-to-moderate pulmonary regurgitation and right atrial      enlargement with pulmonary hypertension.   MEDICATIONS:  1. Coumadin as directed.  2. Lasix 40 mg a day.  3. Lotrel 10/20 daily.   PHYSICAL EXAMINATION:  VITAL SIGNS:  Her blood pressure is high today at  165/103, her weight is actually down 6 pounds at 225, and pulse is 89  and irregular.  HEENT:  Unchanged.  NECK:  Carotid upstrokes were equal bilaterally without bruits.  No  thyromegaly.  Trachea is midline.  LUNGS:  Clear.  No rhonchi or rales.  HEART:  Poorly appreciated PMI.  She has an irregular rate and rhythm,  but no gallop.  I could hear no aortic insufficiency.  ABDOMEN:  Obese with good bowel sounds.  Organomegaly could not be  assessed.  EXTREMITIES:  Pitting edema 2+.  Pulses were intact.   EKG shows atrial fib with incomplete right bundle and left anterior  fascicular block.  No other changes.   I had a long  talk with Bianca [name].  I am concerned about her blood  pressure and her lower extremity edema.  The edema is probably secondary  to inadequately controlled blood pressure, obesity, immobility, and her  pulmonary hypertension.   PLAN:  1. Add spironolactone 25 mg a day.  2. Continue other medications.  3. Check Chem-7 in 2 weeks.  4. See me back in about 6-8 weeks.   She had been on spironolactone in the past.  I do not see whether she  had any hyperkalemia or any other issues.  Her creatinine is normal.     Maisie Fus C. Daleen Squibb, MD, [facility]  Electronically Signed    TCW/MedQ  DD: 07/29/2007  DT: 07/29/2007  Job #: [id number]

## 2010-05-29 NOTE — Assessment & Plan Note (Signed)
[facility]                            CARDIOLOGY OFFICE NOTE   NAME:Bianca [name], Bianca [name]                        MRN:          [id number]  DATE:10/24/2006                            DOB:          [birth year]    [name] returns today for further management of the following issues:  1. Chronic atrial fibrillation.  2. History of intolerance to beta blockers.  3. Anticoagulation.  4. She has had some falls in the past but has not fallen since I saw      her a couple of months ago.  5. Difficult to control hypertension.  6. Mild to moderate left ventricular hypertrophy.  7. Mild to moderate aortic root dilatation with no significant aortic      regurgitation.  8. Mild to moderate pulmonary regurgitation and right atrial      enlargement.   Since last visit she has done well.  She says the Lotrel controls her  pressure.   VITAL SIGNS:  Her blood pressure today is 136/92, pulse is 89 and  irregular.  EKG confirms atrial fibrillation with no changes.  Weight is  231 which is stable.  HEENT:  Unchanged.  NECK:  Shows no JVD. Carotid upstrokes are equal bilaterally without  bruits.  Thyroid is not enlarged.  LUNGS:  Clear.  HEART:  Reveals irregular rate and rhythm without gallop.  ABDOMEN:  Soft.  EXTREMITIES:  Reveal no edema, pulses are present.  NEURO EXAM:  Intact.   [name] is stable from our stand-point.  I made no changes in her  program.  She knows that if she has a fall that we may stop her  Coumadin. She will contact us if that happens.  I will see her back in 6  months.     Thomas C. Daleen Squibb, MD, [facility]  Electronically Signed    TCW/MedQ  DD: 10/24/2006  DT: 10/24/2006  Job #: [id number]   cc:   Henri Medal, MD

## 2010-05-29 NOTE — Assessment & Plan Note (Signed)
[facility]                            CARDIOLOGY OFFICE NOTE   NAME:[name] [name] SPATES                        MRN:          [id number]  DATE:12/22/2007                            DOB:          [birth year]    [name] [name] comes in today for followup.  She is doing remarkably well  without any symptoms of her atrial fib.  She has had no further falls.  She has noted no blood loss.  She has her Coumadin checked here in the  [facility].   PROBLEM LIST:  See the note from July 29, 2007.   MEDICATIONS:  1. Coumadin as directed.  2. Lasix 40 mg a day.  3. Lotrel 10/20 daily.  4. Spironolactone 25 mg per day.   Her electrolytes has been stable on this latter drug.   She has had no significant edema.   PHYSICAL EXAMINATION:  VITAL SIGNS:  Blood pressure is good at 130/90,  her pulse 64 and irregular, and her weight is 234, which is up about 5  pounds.  HEENT:  Unchanged, atraumatic.  NECK:  Carotids upstrokes are equal bilaterally without bruits.  No JVD.  Thyroid is not enlarged.  Trachea is midline.  LUNGS:  Clear to auscultation and percussion.  HEART:  Nondisplaced PMI.  She has an irregular rate and rhythm.  ABDOMEN:  Soft.  Good bowel sounds.  No midline bruit.  No hepatomegaly.  EXTREMITIES:  Without cyanosis, clubbing, or edema.  Pulses are intact.  NEUROLOGIC:  Intact.   I am delighted, [name] [name] is doing well.  Her blood pressure is the  best I have seen it.  The resource nurses have approached me  about  calling her about the ENGAGE study for atrial fib.  I have given my  permission.  She is a good candidate.  I will see her back again in 6  months.     Thomas C. Daleen Squibb, MD, [facility]  Electronically Signed    TCW/MedQ  DD: 12/22/2007  DT: 12/22/2007  Job #: [id number]

## 2010-05-29 NOTE — Assessment & Plan Note (Signed)
[facility]                            CARDIOLOGY OFFICE NOTE   NAME:Bianca [name], Bianca [name]                        MRN:          [id number]  DATE:09/23/2007                            DOB:          March 26, 1930    Bianca [name] returns today for followup.   She is now moved in by herself and is much happier.  She has had no  falls.  She denies any bleeding.  She has had no palpitations,  presyncope, or syncope.  She has had no chest discomfort.   Refer to note of July 29, 2007 for the problem list.   Her medications are unchanged except I added spironolactone 25 mg a day.  Her EF is about 40%.  We checked electrolytes shortly thereafter and  potassium was 3.7 and creatinine 0.7.   She has had some swelling in her lower extremities.  She admits to  eating meat skins!   PHYSICAL EXAMINATION:  VITAL SIGNS:  Her blood pressure today is 140/72,  her pulse is 78 and regular, her weight is 229, up 4.  HEENT:  Unchanged and normal.  Carotids are full without bruits.  Thyroid is not enlarged.  Trachea is midline.  LUNGS:  Clear.  HEART:  Reveals an irregular rate and rhythm.  A well-controlled rate.  ABDOMEN:  Soft.  Good bowel sounds.  No pulsatile mass.  EXTREMITIES:  With no cyanosis or clubbing, but there is 1+ pedal edema.  Pulses are intact.  NEURO:  Intact.  There is no sign of DVT.   Bianca [name] is doing well.  We will continue with her current medications  and have her take an extra Lasix p.r.n. for swelling.  Most of all, she  needs to improve her dietary compliance with sodium restriction.  I will  see her back again in 3 months.     Thomas C. Daleen Squibb, MD, [facility]  Electronically Signed    TCW/MedQ  DD: 09/23/2007  DT: 09/23/2007  Job #: [id number]

## 2010-05-29 NOTE — Assessment & Plan Note (Signed)
[facility]                            CARDIOLOGY OFFICE NOTE   NAME:[name], Bianca SKALSKY                        MRN:          [id number]  DATE:03/28/2008                            DOB:          26-Apr-1930    Bianca [name] comes in today.  She had some prolonged chest discomfort  along the left lateral chest over the weekend.  It did not sound like  angina or ischemia.  She took some soda when she got home, which  relieved it.  She thought it was indigestion.   Her problem list is well outlined in the chart.  The biggest issues is  her atrial fib with the inability to use beta-blockers because of pulses  in the past.  She does have some dyspnea on exertion which is probably  atrial fib with an increased rate, not to mention her weight and  deconditioning.   Her medicines are unchanged and are in the EMR record.  Please refer to  that.   She reports no bleeding on Coumadin.   PHYSICAL EXAMINATION:  VITAL SIGNS:  Her blood pressure is excellent at  132/80 in the left arm, pulse is 88 and regular, her height is 66  inches, weigh is 245.  HEENT:  Normal.  NECK:  Supple.  JVP could not be estimated.  Carotids are full.  CHEST:  Lungs are clear to auscultation and percussion.  HEART:  A poorly appreciated and difficult to appreciated PMI.  Normal  S1 and S2.  ABDOMEN:  Soft, good bowel sounds.  Organomegaly could not be assessed.  EXTREMITIES:  Trace edema.  Pulses are intact.   Bianca [name] is doing well overall.  The dyspnea on exertion is  multifactorial as I mentioned to her.  Unfortunately, her weight  continues to go up without any sign of fluid.  On the way out, she said,  I like the scans.  Obviously, that is a major problem!   I have made no change in her medical program today.  I am reluctant to  use any digoxin or diltiazem or other AV node blocking drug.  If it  becomes a major issue in the future with tachycardia, we may ask to  consider EP  evaluation.     Thomas C. Daleen Squibb, MD, [facility]  Electronically Signed    TCW/MedQ  DD: 03/28/2008  DT: 03/29/2008  Job #: [id number]

## 2010-05-29 NOTE — Assessment & Plan Note (Signed)
[facility]                            CARDIOLOGY OFFICE NOTE   NAME:Bianca [name], Bianca [name]                        MRN:          [id number]  DATE:02/16/2008                            DOB:          02-May-1930    Bianca [name] comes in today for preoperative clearance for total left knee  replacement by Dr. Salvatore Marvel.   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  I  think she is receiving some Procrit preop.   She has not decided for sure she is going to have surgery.  She is very  worried about her heart.  She denies any tachy palpitations, presyncope,  syncope, orthopnea, PND, but does have some mild peripheral edema.  Her  blood pressure has been under excellent control.  She is very compliant  with her anticoagulation for which she is followed here in the clinic.   MEDICATIONS:  1. Coumadin as directed.  2. Lasix 40 mg a day.  3. Lotrel 10/20 daily.  4. Spironolactone 25 mg a day.   PHYSICAL EXAMINATION:  VITAL SIGNS:  Blood pressure is 134/88 which is  really good for her, pulse is 86 and irregular.  She has atrial fib on  her EKG, left anterior fascicular block.  No other change.  Her weight  is up 7 pounds to 241.  HEENT:  Unremarkable.  NECK:  No JVD.  Carotids are full without bruits.  Thyroid is not  enlarged.  LUNGS:  Clear to auscultation and percussion.  HEART:  An irregular rate and rhythm.  No gallop.  ABDOMEN:  Protuberant, good bowel sounds.  EXTREMITIES:  Only trace edema.  Pulses are present.   ASSESSMENT AND PLAN:  Bianca [name] has an increased risk of thromboembolic  complications perioperatively.  She is certainly at low risk from a  cardiac standpoint for the surgical procedure, however, we will have to  overlap her with Lovenox or systemic anticoagulation in the hospital.  I  have recommended that we see her postop and help Dr. Thurston Hole and his team  with getting her back on appropriate  anticoagulation.   We will repeat a 2-D echocardiogram to evaluate LV function and valve  function.  It has been about a year and a half since her last one.     Thomas C. Daleen Squibb, MD, [facility]  Electronically Signed    TCW/MedQ  DD: 02/16/2008  DT: 02/17/2008  Job #: [id number]   cc:   Molly Maduro A. Thurston Hole, M.D.

## 2010-06-01 ENCOUNTER — Ambulatory Visit: Payer: PRIVATE HEALTH INSURANCE | Admitting: Family Medicine

## 2010-06-01 NOTE — Op Note (Signed)
   NAME:  Bianca [name], Bianca [name]                           ACCOUNT NO.:  [id number]   MEDICAL RECORD NO.:  [id number]                   PATIENT TYPE:  AMB   LOCATION:  ENDO                                 FACILITY:  [facility]   PHYSICIAN:  Bernette Redbird, M.D.                DATE OF BIRTH:  1930/11/27   DATE OF PROCEDURE:  07/14/2002  DATE OF DISCHARGE:                                 OPERATIVE REPORT   PROCEDURE:  Colonoscopy with biopsies.   INDICATIONS:  A 25- year-old female with history of solitary diminutive  adenomatous polyp removed colonoscopically five years ago.   FINDINGS:  Mild scattered diverticulosis.  Diminutive rectosigmoid  hyperplastic-appearing polyps.   DESCRIPTION OF PROCEDURE:  The nature and purposes of the procedure were  familiar to the patient from prior examination and she provided consent.  Sedation with Fentanyl 62.5 mcg and Versed 6 mg IV without arrhythmias or  desaturation.  The Olympus adult video colonoscope was advanced without  significant difficulty around the colon to the cecum, using some external  abdominal compression to get the tip of the scope down to the base of the  cecum.  The cecum was clearly identified by visualization of the appendiceal  orifice and pullback was then performed.  The quality of prep was very good  and it was felt that all areas were well seen.   In the rectosigmoid region were approximately eight tiny 2 to 3 mm sessile  hyperplastic-appearing polyps removed by cold biopsies.  Not every sessile  polyp was removed because some were so small and/or flat that they were not  felt to be clinically significant but a good sampling was obtained.   No erythematous or large polyps, cancer, colitis, or vascular malformations  were observed.  There was some mild scattered diverticulosis throughout the  colon.   Retroflexion of the rectum as well as reinspection of the rectosigmoid was  unremarkable.   The patient tolerated the procedure  well and there were no apparent  complications.   IMPRESSION:  1. Diminutive polyps not likely to be clinically significant, pathology     pending.  2. Scattered diverticulosis.   PLAN:  Await pathology with anticipated colonoscopic follow up in five years  unless multiple adenomas are found.                                               Bernette Redbird, M.D.    RB/MEDQ  D:  07/14/2002  T:  07/14/2002  Job:  [id number]

## 2010-06-01 NOTE — Op Note (Signed)
NAME:  GERALD, HONEA                           ACCOUNT NO.:  [id number]   MEDICAL RECORD NO.:  [id number]                   PATIENT TYPE:  AMB   LOCATION:  DSC                                  FACILITY:  [facility]   PHYSICIAN:  Robert A. Thurston Hole, M.D.              DATE OF BIRTH:  [birth year]   DATE OF PROCEDURE:  12/07/2001  DATE OF DISCHARGE:                                 OPERATIVE REPORT   PREOPERATIVE DIAGNOSIS:  Right knee medial meniscus tear.   POSTOPERATIVE DIAGNOSES:  1. Right knee medial and lateral meniscal tears.  2. Right knee chondromalacia.   PROCEDURE:  1. Right knee examination under anesthesia followed by arthroscopic partial     medial and lateral meniscectomies.  2. Right knee chondroplasty.   SURGEON:  Elana Alm. Thurston Hole, M.D.   ASSISTANT:  Candace Cruise. Minor, PA.   ANESTHESIA:  Local and MAC.   OPERATIVE TIME:  30 minutes.   COMPLICATIONS:  None.   INDICATIONS FOR PROCEDURE:  The patient is a 77 year old woman who has had  significant right knee pain for the past six to nine months increasing in  nature with signs and symptoms and MRI documenting medial meniscus tear and  chondromalacia who has failed conservative care and is now to undergo  arthroscopy.   DESCRIPTION OF PROCEDURE:  The patient was brought to the operating room on  December 07, 2001, placed on the operating table in the supine position  after a knee block had been placed in the holding room.  Her right knee was  examined under anesthesia.  Range of motion 0 to 125 degrees, 1 to 2+  crepitation, knee stable ligamentous examination with normal patella  tracking. The right leg was prepped using sterile DuraPrep and draped using  sterile technique.  Originally through an inferior lateral portal, the  arthroscope with a pump attachment was placed and through an inferior medial  portal, an arthroscopic probe was placed.  On initial inspection of the  medial compartment, she had 25 to 30% grade  III chondromalacia, medial  femoral condyle, medial tibial plateau which was debrided.  Medial meniscus  showed tearing of the posterior and medial horn of which 40% was resected  back to a stable rim.  Intercondylar notch was inspected.  Anterior and  posterior cruciate ligaments were normal.  Lateral compartment was  inspected.  Articular cartilage, lateral femoral condyle, lateral tibial  plateau showed mild grade I to II chondromalacia.  Lateral meniscus showed  tearing of the posterior and lateral horn 20% which was resected back to a  stable rim.  Patellofemoral joint showed mild grade I to II chondromalacia.  The patella tracked normally.  Moderate synovitis in the medial and lateral  gutters were debrided, otherwise they were free of pathology.  After this  was done, it was felt that all pathology had been satisfactorily addressed.  The instruments were removed.  Portals closed with 3-0 nylon suture and  injected with 0.25% Marcaine with epinephrine and 4 mg of morphine.  Sterile  dressings applied and the patient awakened and taken to the recovery room in  stable condition.   FOLLOW UP:  The patient will be followed as an outpatient on Talwin NX,  Celebrex and Phenergan. I will see her back in the office in a week for  sutures out and follow-up.                                                 Robert A. Thurston Hole, M.D.    RAW/MEDQ  D:  12/07/2001  T:  12/07/2001  Job:  [id number]

## 2010-06-01 NOTE — Op Note (Signed)
[place]  Patient:    Bianca [name], Bianca [name]                          MRN: [id number] Proc. Date: 08/27/99 Attending:  Elana Alm. Thurston Hole, M.D.                           Operative Report  PREOPERATIVE DIAGNOSIS: Left knee medial meniscus tear.  POSTOPERATIVE DIAGNOSES:  1. Left knee medial and lateral meniscal tears.  2. Left knee possible rheumatoid arthritis versus inflammatory synovitis.  OPERATION/PROCEDURE:  1. Left knee examined under anesthesia followed by arthroscopic partial     medial and lateral meniscectomies.  2. Left knee partial synovectomy.  SURGEON: Elana Alm. Thurston Hole, M.D.  ASSISTANT: Kirstin Adelberger, P.A.  ANESTHESIA: Local and MAC.  OPERATIVE TIME: Operative time was 30 minutes.  COMPLICATIONS: None.  INDICATIONS FOR PROCEDURE: Ms. Cegielski is a 77 year old woman who has had significant left knee pain for the past three to four months, increasing in nature, with signs and symptoms consistent with medial meniscus tear and early DJD.  She has failed conservative care and is now to undergo arthroscopy.  DESCRIPTION OF PROCEDURE: Ms. Maselli was brought to the operating room on August 27, 1999 and placed on the operative table in the supine position.  Her block had been placed in the holding room.  Her left knee was examined under anesthesia and range of motion was 0-125 degrees with 1-2+ crepitation noted, with stable ligamentous examination.  The left leg was prepped using sterile Betadine and draped using sterile technique.  Originally through an inferolateral portal the arthroscope with a pump attached was placed and in through an inferomedial portal an arthroscopic probe was placed.  On initial inspection of the medial compartment the articular cartilage of medial femoral condyle and medial tibial plateau showed 25-30% grade 3 chondromalacia, which was debrided.  She had a posterior and medial horn tear of the medial  meniscus of which 50% was resected back to a stable rim.  There was a large amount of anterior hemorrhagic inflamed synovitis which was thoroughly debrided and also sent for pathologic review due to the inflammatory nature of the synovium. The anterior and posterior cruciate ligaments were found to be normal.  The lateral compartment was inspected and articular cartilage of lateral femoral condyle and lateral tibial plateau showed mild grade 1-2 changes.  A lateral meniscus tear of the posterior and lateral horn, 25-30%, was resected back to a stable rim.  The patellofemoral joint was inspected with grade 3 chondromalacia of over 50%, which was debrided.  The patella tracked normally. Again, a very large amount of hemorrhagic synovitis and inflammatory synovitis was noted in the medial and lateral gutters and this as thoroughly debrided, and also further samples sent for pathologic review.  No other pathology was noted.  At this point it was felt all pathology had been satisfactorily addressed and instruments were removed.  The portals were closed with 3-0 Nylon suture and injected with 0.25% Marcaine with epinephrine.  A sterile dressing was applied and the patient was awakened and taken to the recovery room in stable condition.  Of note, Ms. Stepka also had blood work sent for arthritis panel due to her inflammatory synovitis.  FOLLOW-UP CARE: Ms. Dewing will be followed as an outpatient on Talwin Nx for pain and Celebrex.  I will see her back in the office in a  week for suture removal and follow-up. DD:  08/27/99 TD:  08/27/99 Job: [id number]

## 2010-06-01 NOTE — Assessment & Plan Note (Signed)
[facility]                              CARDIOLOGY OFFICE NOTE   NAME:Bianca [name], Bianca [name]                        MRN:          [id number]  DATE:10/25/2005                            DOB:          1930-02-06    Bianca [name] returns for further management of her chronic atrial fib.   She still has her baseline dyspnea on exertion, but it seems to be getting  worse.  She has had no syncope or pre-syncope.   She has lost some weight and is down from 255 to 237.  I congratulated her.   She is on Lotrel 10/20 q. day, Coumadin as directed and Lasix 40 mg a day.   PHYSICAL EXAMINATION:  VITAL SIGNS:  Her blood pressure today is 140/78.  Her pulse is 80 to 85 and irregular.  EKG confirms atrial fib with left axis  deviation, non-specific ST segment changes.  Her weight is 237.  HEENT:  Unremarkable.  NECK:  Carotid upstrokes are equal bilaterally without bruits, no JVD.  Thyroid is not enlarged.  Trachea is midline.  LUNGS:  Clear.  HEART:  Reveals a regular rate and rhythm without gallop, rub or murmur.  ABDOMEN:  Soft with good bowel sounds.  Organomegaly could not be  appreciated.  EXTREMITIES:  Reveal only trace edema.  Pulses are present.  NEURO:  Exam is intact.   ASSESSMENT/PLAN:  Bianca [name] seems to be having more dyspnea on exertion.  This despite losing weight, but she is still very heavy, and she knows.  Her  blood pressure is not optimally controlled today.   PLAN:  1. Twenty-four hour Holter monitor with activity and a diary to correlate      dyspnea on exertion with her heart rate.  2. Continue current medications.  3. Continue weight loss.  4. Follow up with Dr. Darrick Penna concerning her blood pressure and medical      problems.  5. If her heart rate is very fast with dyspnea on minimal exertion, we      might consider trying a      non-hydro pyridine calcium channel blocker.  She has been intolerant of      beta blockers in the past, I  thought it was going to kill me.       Thomas C. Daleen Squibb, MD, [facility]     TCW/MedQ  DD:  10/25/2005  DT:  10/27/2005  Job #:  [id number]   cc:   Sibyl Parr. Darrick Penna, M.D.

## 2010-06-01 NOTE — Assessment & Plan Note (Signed)
[facility]                            CARDIOLOGY OFFICE NOTE   NAME:[name], Bianca STRINGFELLOW                        MRN:          [id number]  DATE:03/05/2006                            DOB:          12-18-1930    Bianca [name] returns today for further management of the following issues.  1. Chronic atrial fibrillation.  2. Anticoagulation.  3. Dyspnea on exertion.   She has had a negative stress Myoview July 08, 2003.  In addition, she  has normal left ventricular systolic function and some mild pulmonary  hypertension.  This is most likely secondary to obesity.   Because she was having more dyspnea on exertion last visit, we obtained  a 24 hour Holter monitor.  It showed an average heart rate of 89 beats  per minute, minimum of 63, max of 136.  The minimum was during her  sleep.  Max was during the evening hours and I am sure activity.  She  also had a 2.2 second pause.  We did not want to change her from a non-  rate-slowing calcium channel blocker to a rate-slowing calcium channel  blocker.   Her blood pressure has been better.  She still has some dyspnea on  exertion.   Unfortunately, she has fallen twice, once hitting her face.  She did not  report for medical therapy.  Her left knee gave way.  She is wondering  if this is secondary to Coumadin.  After a long discussion, she told me  she has seen Dr. Thurston Hole, who has operated on her left knee before.  I  have advised her to follow up with him.  Most importantly, I have  advised her not to continue taking Coumadin if she continues to fall.  We made this very clear with her grand daughter there today.   1. She is on Lotrel 10/20 daily.  2. Coumadin as directed.  3. Lasix 40 mg a day.  4. Omeprazole 20 mg a day.   Blood pressure is 149/81, pulse is 80 and irregular, weight is 238 and  stable.  She is in no acute distress.  HEENT:  Normocephalic, atraumatic.  PERRLA.  Extraocular muscles are  intact.  Sclerae clear.  She is very pleasant, alert and oriented x3.  Neuro exam is intact.  There is no tenderness to her muscles of her upper extremities or lower  extremities.  There is no sign of a major effusion musculoskeletal over  the left knee.  NECK:  Supple.  No JVD.  Carotid upstrokes are equal bilaterally without  bruits.  No thyromegaly.  LUNGS:  Clear.  HEART:  Irregular rate and rhythm.  There is no gallop.  ABDOMEN:  Soft.  Good bowel sounds.  EXTREMITIES:  Trace edema bilaterally.  Pulses are present.   ASSESSMENT AND PLAN:  I have had a long talk with Bianca [name] and her  grand daughter today.  We will continue with her current medical  program, but if she continues to fall, I want her to stop her Coumadin.  We would switch her to aspirin  325 mg a day.  She knows that this is not  as good as Coumadin for preventing stroke.  However, we do not want to  have a subdural hematoma.   I will see her back in a year.  I will send a copy of this note to Dr.  Thurston Hole.  I have advised her to call his office.     Thomas C. Daleen Squibb, MD, [facility]  Electronically Signed    TCW/MedQ  DD: 03/05/2006  DT: 03/05/2006  Job #: [id number]   cc:   Sibyl Parr. Darrick Penna, M.D.  Robert A. Thurston Hole, M.D.

## 2010-06-05 ENCOUNTER — Ambulatory Visit (INDEPENDENT_AMBULATORY_CARE_PROVIDER_SITE_OTHER): Payer: PRIVATE HEALTH INSURANCE | Admitting: Family Medicine

## 2010-06-05 ENCOUNTER — Encounter: Payer: Self-pay | Admitting: Family Medicine

## 2010-06-05 DIAGNOSIS — R0600 Dyspnea, unspecified: Secondary | ICD-10-CM

## 2010-06-05 DIAGNOSIS — R609 Edema, unspecified: Secondary | ICD-10-CM

## 2010-06-05 DIAGNOSIS — R0989 Other specified symptoms and signs involving the circulatory and respiratory systems: Secondary | ICD-10-CM

## 2010-06-05 DIAGNOSIS — R600 Localized edema: Secondary | ICD-10-CM

## 2010-06-05 DIAGNOSIS — R0609 Other forms of dyspnea: Secondary | ICD-10-CM

## 2010-06-05 DIAGNOSIS — L299 Pruritus, unspecified: Secondary | ICD-10-CM

## 2010-06-05 LAB — COMPREHENSIVE METABOLIC PANEL
ALT: 19 U/L (ref 0–35)
AST: 29 U/L (ref 0–37)
Albumin: 4.3 g/dL (ref 3.5–5.2)
Alkaline Phosphatase: 97 U/L (ref 39–117)
Calcium: 9.6 mg/dL (ref 8.4–10.5)
Chloride: 104 mEq/L (ref 96–112)
Potassium: 3.9 mEq/L (ref 3.5–5.3)
Sodium: 141 mEq/L (ref 135–145)
Total Protein: 7 g/dL (ref 6.0–8.3)

## 2010-06-05 LAB — CBC
HCT: 40.3 % (ref 36.0–46.0)
MCH: 28 pg (ref 26.0–34.0)
MCHC: 32 g/dL (ref 30.0–36.0)
MCV: 87.4 fL (ref 78.0–100.0)
RDW: 16 % — ABNORMAL HIGH (ref 11.5–15.5)

## 2010-06-05 NOTE — Patient Instructions (Signed)
We will check some blood work today, and we will let you know if we need to adjust your medicines. Try to keep your feet propped up whenever you are sitting down. Let us know if you get worse, or if you have chest pain, or if the breathing gets worse.

## 2010-06-06 ENCOUNTER — Encounter: Payer: Self-pay | Admitting: Family Medicine

## 2010-06-06 DIAGNOSIS — L299 Pruritus, unspecified: Secondary | ICD-10-CM | POA: Insufficient documentation

## 2010-06-06 DIAGNOSIS — R600 Localized edema: Secondary | ICD-10-CM | POA: Insufficient documentation

## 2010-06-06 NOTE — Assessment & Plan Note (Signed)
Chronic problem.  No rash seen.  Check LFTs.

## 2010-06-06 NOTE — Assessment & Plan Note (Addendum)
Pt with previous problems with edema.  May be due to CHF, also could be venous insufficiency.  Will check BNP, renal function, TSH.  No pulmonary compromise or rales, so will not change meds today - will await labs.  Reviewed supportive care and warning signs.

## 2010-06-06 NOTE — Progress Notes (Signed)
  Subjective:    Patient ID: Bianca [name], female    DOB: Dec 05, 1930, 77 y.o.   MRN: [id number]  HPI27 yo female here for leg swelling.  She has noticed this in the past, usually when the weather is hot, but for the last month has noted B foot swelling up to mid lower leg.  She has known diastolic dysfunction, but reports no chest pain and has stable SOB.  Denies orthopnea and PND.  Urine output is normal.  Both feet swell equally.   Pt also reports itching, feeling of bumps being under skin.  This has been present for years.    Review of Systems  See HPI     Objective:   Physical Exam  Nursing note and vitals reviewed. Constitutional: No distress.       Obese  Neck: No JVD present.  Cardiovascular: Exam reveals no gallop and no friction rub.   No murmur heard.      Irregularly irregular.  Pulmonary/Chest: Effort normal and breath sounds normal. No respiratory distress. She has no wheezes. She has no rales.  Musculoskeletal: She exhibits no edema.       BLE with trace edema up to ankles.  Skin: Skin is warm and dry. No rash noted. She is not diaphoretic.          Assessment & Plan:

## 2010-06-07 ENCOUNTER — Encounter: Payer: Self-pay | Admitting: Family Medicine

## 2010-06-13 ENCOUNTER — Encounter: Payer: Self-pay | Admitting: Family Medicine

## 2010-06-13 ENCOUNTER — Encounter: Payer: PRIVATE HEALTH INSURANCE | Admitting: *Deleted

## 2010-06-18 ENCOUNTER — Ambulatory Visit (INDEPENDENT_AMBULATORY_CARE_PROVIDER_SITE_OTHER): Payer: PRIVATE HEALTH INSURANCE | Admitting: *Deleted

## 2010-06-18 DIAGNOSIS — I4891 Unspecified atrial fibrillation: Secondary | ICD-10-CM

## 2010-07-16 ENCOUNTER — Ambulatory Visit (INDEPENDENT_AMBULATORY_CARE_PROVIDER_SITE_OTHER): Payer: PRIVATE HEALTH INSURANCE | Admitting: *Deleted

## 2010-07-16 DIAGNOSIS — I4891 Unspecified atrial fibrillation: Secondary | ICD-10-CM

## 2010-08-01 ENCOUNTER — Ambulatory Visit (INDEPENDENT_AMBULATORY_CARE_PROVIDER_SITE_OTHER): Payer: PRIVATE HEALTH INSURANCE | Admitting: Family Medicine

## 2010-08-01 DIAGNOSIS — R21 Rash and other nonspecific skin eruption: Secondary | ICD-10-CM

## 2010-08-01 DIAGNOSIS — L299 Pruritus, unspecified: Secondary | ICD-10-CM

## 2010-08-01 NOTE — Patient Instructions (Signed)
Use Eucerin cream on back and anywhere that your skin itches 2 x day. To get it on your back.  Have someone put it on you.  Or use a spatula taped to a stick or soft cloth attached to long brush.  Don't apply with the brush because this may be irritating your skin. On the small red spot on your left breast.  Apply Lamisil cream 2 x day to this spot.    If no improvement or worsening of symptoms please return and see Dr. Tye Savoy. You and talk more with her at your next appointment about the shingles vaccine.

## 2010-08-02 ENCOUNTER — Ambulatory Visit: Payer: PRIVATE HEALTH INSURANCE | Admitting: Family Medicine

## 2010-08-05 DIAGNOSIS — R21 Rash and other nonspecific skin eruption: Secondary | ICD-10-CM | POA: Insufficient documentation

## 2010-08-05 NOTE — Progress Notes (Signed)
  Subjective:    Patient ID: Bianca [name], female    DOB: Dec 04, 1930, 77 y.o.   MRN: [id number]  HPI Rash on left breast: Small circular red rash, scaley, sometimes itches.  Unsure how long it has been there.  Lotion hasn't helped it.  Itching: Pt states "i itch all over", but especially on her back.  has had this problem for approx 1 year off and on. Sometimes very bad at night.  Told by previous md to use lotion for dry skin.  Pt has been applying this to her back with a brush.  Itching isn't improving.  No rash on back.  Liver enzymes recently checked and were wnl.   Review of Systems    as per above.  No fever. No cold symptoms.  No abd pain.  Objective:   Physical Exam  Constitutional: She is oriented to person, place, and time. She appears well-developed and well-nourished.  HENT:  Head: Normocephalic and atraumatic.  Cardiovascular: Normal rate.   Pulmonary/Chest: Effort normal. No respiratory distress. She has no wheezes.  Neurological: She is alert and oriented to person, place, and time.  Skin:       No rash on back, arms, or legs.  Some dry skin present on back.    Left medial/lower part of breast- Small quarter sized, erythematous patch,  Scaley area at edges.    Psychiatric: She has a normal mood and affect. Her behavior is normal.          Assessment & Plan:

## 2010-08-05 NOTE — Assessment & Plan Note (Signed)
Liver enzymes wnl at last visit.  Itching most likely 2/2 dry skin and irritation from brush that is being used to apply lotion. Pt also may be using alcohol based lostion.  Pt encouraged to use eucerin bid.  Pt to apply to back with spatula or a soft cloth wrapped around brush.

## 2010-08-07 ENCOUNTER — Ambulatory Visit: Payer: PRIVATE HEALTH INSURANCE | Admitting: Home Health Services

## 2010-08-15 ENCOUNTER — Ambulatory Visit (INDEPENDENT_AMBULATORY_CARE_PROVIDER_SITE_OTHER): Payer: PRIVATE HEALTH INSURANCE | Admitting: *Deleted

## 2010-08-15 DIAGNOSIS — Z7901 Long term (current) use of anticoagulants: Secondary | ICD-10-CM | POA: Insufficient documentation

## 2010-08-15 DIAGNOSIS — I4891 Unspecified atrial fibrillation: Secondary | ICD-10-CM

## 2010-08-15 LAB — POCT INR: INR: 2.8

## 2010-08-21 ENCOUNTER — Other Ambulatory Visit: Payer: Self-pay | Admitting: Family Medicine

## 2010-08-21 NOTE — Telephone Encounter (Signed)
refill request

## 2010-08-23 ENCOUNTER — Ambulatory Visit (INDEPENDENT_AMBULATORY_CARE_PROVIDER_SITE_OTHER): Payer: PRIVATE HEALTH INSURANCE | Admitting: Family Medicine

## 2010-08-23 VITALS — BP 130/80 | HR 72 | Wt 224.6 lb

## 2010-08-23 DIAGNOSIS — G5690 Unspecified mononeuropathy of unspecified upper limb: Secondary | ICD-10-CM

## 2010-08-23 DIAGNOSIS — S1093XA Contusion of unspecified part of neck, initial encounter: Secondary | ICD-10-CM

## 2010-08-23 DIAGNOSIS — W06XXXA Fall from bed, initial encounter: Secondary | ICD-10-CM

## 2010-08-23 DIAGNOSIS — S0003XA Contusion of scalp, initial encounter: Secondary | ICD-10-CM

## 2010-08-23 DIAGNOSIS — S0083XA Contusion of other part of head, initial encounter: Secondary | ICD-10-CM | POA: Insufficient documentation

## 2010-08-23 DIAGNOSIS — W19XXXA Unspecified fall, initial encounter: Secondary | ICD-10-CM

## 2010-08-23 MED ORDER — TRAMADOL-ACETAMINOPHEN 37.5-325 MG PO TABS: 1.0000 | ORAL_TABLET | Freq: Four times a day (QID) | ORAL | Status: DC | PRN

## 2010-08-23 NOTE — Assessment & Plan Note (Signed)
Chronic weakness of ulnar or 7th nerve supplied muscles right hand. Consider MRI due to right shoulder and arm pain.

## 2010-08-23 NOTE — Progress Notes (Deleted)
  Subjective:    Patient ID: Bianca [name], female    DOB: [birth year], 77 y.o.   MRN: [id number]  HPI    Review of Systems     Objective:   Physical Exam        Assessment & Plan:

## 2010-08-23 NOTE — Assessment & Plan Note (Signed)
No associated with pre, during or post symptoms. No significant injury. Therapeutic INR Plan: Cold compresses. Continue home medications.

## 2010-08-23 NOTE — Progress Notes (Signed)
  Subjective:    Patient ID: Bianca [name], female    DOB: 02/20/1930, 77 y.o.   MRN: [id number]  HPI 77 y/o F with history of Afib on coumadin with therapeutic INR (2.8 on 08/15/10) comes c/o swollen eye and forehead after a fall that happened 3 days ago. She was laying down at night on her bed and trying to reach for the remote control her night gown got tangled and fell from a hight about 2 feet. Her flooring is carpet and she bruised her left forehead around her eyebrow and left eye with the floor.  It took her about 10 min but she got up by herself trying to use pillows to avoid injury her knees. Denies headache, lightheadedness, nausea, vomiting, changes on her vision or other symptoms before, during and after her fall.     Review of Systems Please refer to HPI    Objective:   Physical Exam  Constitutional: She is oriented to person, place, and time. No distress.  HENT:  Head: Normocephalic.       Lower left forehead around eyebrow with excoriation. No echymosis or significant swelling.    Eyes: EOM are normal. Pupils are equal, round, and reactive to light.       There is moderate swelling on both upper and lower palpebral  folds. No ecchymosis. Mild subconjunctival hemorrhage on left eye. Vision in normal range for cataracts.  No pain on palpation of facial bone structures. Fundoscopy difficult due to central cataracts but no hemorrhage seen.    Neck: Neck supple.  Cardiovascular: Normal rate and normal heart sounds.        Irregular rhythm    Pulmonary/Chest: Breath sounds normal.  Abdominal: Bowel sounds are normal. She exhibits no distension. There is no tenderness.  Neurological: She is alert and oriented to person, place, and time. No cranial nerve deficit.       She was unable to spread the middle fingers and mild claw type weakness right hand.           Assessment & Plan:

## 2010-08-23 NOTE — Patient Instructions (Addendum)
It Has been a pleasure to meet you today. Use cold compress on the affected area 3 times a day. If headache, nausea, vomiting, lightheadedness or dizziness please get evaluated by medical personnel. Continue taking home medications as usual.

## 2010-09-14 ENCOUNTER — Encounter: Payer: PRIVATE HEALTH INSURANCE | Admitting: *Deleted

## 2010-09-19 ENCOUNTER — Other Ambulatory Visit: Payer: Self-pay | Admitting: Gastroenterology

## 2010-09-26 ENCOUNTER — Ambulatory Visit (INDEPENDENT_AMBULATORY_CARE_PROVIDER_SITE_OTHER): Payer: PRIVATE HEALTH INSURANCE | Admitting: *Deleted

## 2010-09-26 DIAGNOSIS — I4891 Unspecified atrial fibrillation: Secondary | ICD-10-CM

## 2010-09-26 LAB — POCT INR: INR: 2.1

## 2010-10-08 ENCOUNTER — Other Ambulatory Visit: Payer: Self-pay | Admitting: Family Medicine

## 2010-10-08 NOTE — Telephone Encounter (Signed)
Refill request

## 2010-10-15 LAB — GLUCOSE, CAPILLARY: Glucose-Capillary: 89

## 2010-10-25 ENCOUNTER — Ambulatory Visit (INDEPENDENT_AMBULATORY_CARE_PROVIDER_SITE_OTHER): Payer: PRIVATE HEALTH INSURANCE | Admitting: *Deleted

## 2010-10-25 DIAGNOSIS — I4891 Unspecified atrial fibrillation: Secondary | ICD-10-CM

## 2010-11-10 ENCOUNTER — Other Ambulatory Visit: Payer: Self-pay | Admitting: Family Medicine

## 2010-11-12 NOTE — Telephone Encounter (Signed)
Refill request

## 2010-11-12 NOTE — Telephone Encounter (Signed)
Escribed

## 2010-11-19 ENCOUNTER — Ambulatory Visit (INDEPENDENT_AMBULATORY_CARE_PROVIDER_SITE_OTHER): Payer: PRIVATE HEALTH INSURANCE | Admitting: Family Medicine

## 2010-11-19 ENCOUNTER — Encounter: Payer: Self-pay | Admitting: Family Medicine

## 2010-11-19 VITALS — BP 154/97 | HR 90 | Temp 98.1°F | Ht 66.6 in | Wt 232.0 lb

## 2010-11-19 DIAGNOSIS — E739 Lactose intolerance, unspecified: Secondary | ICD-10-CM

## 2010-11-19 DIAGNOSIS — T148XXA Other injury of unspecified body region, initial encounter: Secondary | ICD-10-CM

## 2010-11-19 DIAGNOSIS — Z79899 Other long term (current) drug therapy: Secondary | ICD-10-CM

## 2010-11-19 MED ORDER — CYCLOBENZAPRINE HCL 5 MG PO TABS: 5.0000 mg | ORAL_TABLET | Freq: Three times a day (TID) | ORAL | Status: AC | PRN

## 2010-11-19 MED ORDER — CAPSAICIN 0.025 % EX CREA: TOPICAL_CREAM | Freq: Two times a day (BID) | CUTANEOUS | Status: DC

## 2010-11-19 NOTE — Patient Instructions (Signed)
It was great to see you again, [facility]. It appears you have a muscle sprain - please use the following prescriptions as directed. I will send you a letter with your hemoglobin A1C results. Please schedule a follow up appointment with me in 4 months or sooner if needed. Have a good week!  Muscle Cramps Muscle cramps are due to sudden involuntary muscle contraction. This means you have no control over the tightening of a muscle (or muscles). Often there are no obvious causes. Muscle cramps may occur with overexertion. They may also occur with chilling of the muscles. An example of a muscle chilling activity is swimming. It is uncommon for cramps to be due to a serious underlying disorder. In most cases, muscle cramps improve (or leave) within minutes. CAUSES  Some common causes are:  Injury.   Infections, especially viral.   Abnormal levels of the salts and ions in your blood (electrolytes). This could happen if you are taking water pills (diuretics).   Blood vessel disease where not enough blood is getting to the muscles (intermittent claudication).  Some uncommon causes are:  Side effects of some medicine (such as lithium).   Alcohol abuse.   Diseases where there is soreness (inflammation) of the muscular system.  HOME CARE INSTRUCTIONS   It may be helpful to massage, stretch, and relax the affected muscle.   Taking a dose of over-the-counter diphenhydramine is helpful for night leg cramps.  SEEK MEDICAL CARE IF:  Cramps are frequent and not relieved with medicine. MAKE SURE YOU:   Understand these instructions.   Will watch your condition.   Will get help right away if you are not doing well or get worse.  Document Released: 06/22/2001 Document Revised: 09/12/2010 Document Reviewed: 12/23/2007 [facility] Patient Information 2012 [place], [place].

## 2010-11-22 ENCOUNTER — Encounter: Payer: PRIVATE HEALTH INSURANCE | Admitting: *Deleted

## 2010-11-23 DIAGNOSIS — T148XXA Other injury of unspecified body region, initial encounter: Secondary | ICD-10-CM | POA: Insufficient documentation

## 2010-11-23 NOTE — Progress Notes (Signed)
  Subjective:    Patient ID: Bianca [name], female    DOB: [birth year], 77 y.o.   MRN: [id number]  HPI  Patient presents to clinic to discuss left side pain and diabetes.  Left-sided pain: Patient woke up this morning with left lower back and left side pain. She describes the pain as constant and sore. She says it is tender on palpation. She has had a history of muscle spasms before and states that this is atypical presentation. She thinks that she may have slept on her side all night and may have pulled a muscle. She says that pain is worse when she lifts her hand over her her head. She has not taken any over-the-counter analgesics. Denies any pain in her chest or abdomen.  Diabetes: Patient has had a history of elevated glucose but no diagnosis of diabetes. She does not take any oral hypoglycemics. Her current A1c today is 6.1. She watches her diet, but does not exercise on her in basis. She denies any sweating, headache, altered level of consciousness, nausea or vomiting. Denies any polyuria, polydipsia.  Denies any numbness or tingling of the extremities or changes in vision.   I have reviewed patient's medications, allergies, past medical history, and problem list.    Review of Systems  Her history of present illness    Objective:   Physical Exam  Constitutional: No distress.  HENT:  Mouth/Throat: Oropharynx is clear and moist.  Neck: Normal range of motion.  Cardiovascular: Normal rate and regular rhythm.   Pulmonary/Chest: Effort normal and breath sounds normal. She has no wheezes. She has no rales.  Abdominal: Soft. Bowel sounds are normal.  Musculoskeletal:       Left side pain worse with passive ROM of left arm; left paraspinal muscles are tender on palpation; no left shoulder pain or bony tenderness; Right upper extremity - normal exam  Lymphadenopathy:    She has no cervical adenopathy.  Skin:       No erythema, swelling, laceration or bruising of left flank or back           Assessment & Plan:

## 2010-11-23 NOTE — Assessment & Plan Note (Signed)
A1c today is 6.1. Encouraged her carbohydrate modified diet and routine weightbearing exercises. Will recheck A1c and comprehensive metabolic panel in 6 months or at her next annual physical exam.

## 2010-11-23 NOTE — Assessment & Plan Note (Signed)
Pain likely secondary to muscle strain or muscle spasm. Will treat with Flexeril 5 mg 3 times a day as needed for muscle spasms.  Will also give prescription for Capsaicin cream to be applied to affected area as needed. Cream it may also be used for chronic arthritis pain. Patient to followup with PCP in 4 months or sooner if necessary.

## 2010-12-13 ENCOUNTER — Ambulatory Visit (INDEPENDENT_AMBULATORY_CARE_PROVIDER_SITE_OTHER): Payer: PRIVATE HEALTH INSURANCE | Admitting: *Deleted

## 2010-12-13 DIAGNOSIS — Z7901 Long term (current) use of anticoagulants: Secondary | ICD-10-CM

## 2010-12-13 DIAGNOSIS — I4891 Unspecified atrial fibrillation: Secondary | ICD-10-CM

## 2010-12-13 LAB — POCT INR: INR: 2.2

## 2010-12-14 ENCOUNTER — Other Ambulatory Visit: Payer: Self-pay | Admitting: Family Medicine

## 2010-12-14 NOTE — Telephone Encounter (Signed)
Refill request

## 2011-01-19 ENCOUNTER — Encounter (HOSPITAL_COMMUNITY): Payer: Self-pay | Admitting: *Deleted

## 2011-01-19 ENCOUNTER — Emergency Department (INDEPENDENT_AMBULATORY_CARE_PROVIDER_SITE_OTHER)
Admission: EM | Admit: 2011-01-19 | Discharge: 2011-01-19 | Disposition: A | Discharge status: Home / Self Care | Payer: PRIVATE HEALTH INSURANCE | Source: Home / Self Care | Attending: Family Medicine | Admitting: Family Medicine

## 2011-01-19 DIAGNOSIS — M461 Sacroiliitis, not elsewhere classified: Secondary | ICD-10-CM

## 2011-01-19 HISTORY — DX: Essential (primary) hypertension: I10

## 2011-01-19 HISTORY — DX: Unspecified atrial fibrillation: I48.91

## 2011-01-19 MED ORDER — PREDNISONE (PAK) 5 MG PO TABS: ORAL_TABLET | ORAL | Status: DC

## 2011-01-19 NOTE — ED Provider Notes (Signed)
History     CSN: [id number]  Arrival date & time 01/19/11  1000   First MD Initiated Contact with Patient 01/19/11 1032      Chief Complaint  Patient presents with  . Generalized Body Aches  . Back Pain    (Consider location/radiation/quality/duration/timing/severity/associated sxs/prior treatment) HPI Comments: Bianca [name] presents for evaluation of lower back pain over the last few days. Bianca [name] reports multiple pains "moving over her body." Bianca [name] reports onset of neck pain last week, that moved to her arms, then lower legs; now Bianca [name] is experiencing pain across her lower back. Bianca [name] reports that Bianca [name] has been walking door to door with Jehovah's Witness, wearing flat dress shoes. Bianca [name] also now reports pain in her LEFT foot. Bianca [name] denies any numbness, tingling, or weakness.   Patient is a 78 y.o. female presenting with back pain. The history is provided by the patient.  Back Pain  This is a new problem. The problem occurs constantly. The problem has not changed since onset.The pain is associated with no known injury. The pain is present in the lumbar spine and sacro-iliac joint. The quality of the pain is described as aching. The pain does not radiate. The pain is moderate. The symptoms are aggravated by certain positions. Pertinent negatives include no paresthesias, no paresis, no tingling and no weakness.    Past Medical History  Diagnosis Date  . Hypertension   . CHF (congestive heart failure)   . Campath-induced atrial fibrillation     Past Surgical History  Procedure Date  . Cholecystectomy   . Abdominal hysterectomy   . Tubal ligation   . Knee arthroscopy     bilat knees    Family History  Problem Relation Age of Onset  . Depression Daughter     History  Substance Use Topics  . Smoking status: Former Games developer  . Smokeless tobacco: Not on file  . Alcohol Use: No    OB History    Grav Para Term Preterm Abortions TAB SAB Ect Mult Living                  Review of Systems    Constitutional: Negative.   HENT: Negative.   Eyes: Negative.   Respiratory: Negative.   Cardiovascular: Negative.   Gastrointestinal: Negative.   Genitourinary: Negative.   Musculoskeletal: Positive for back pain.  Skin: Negative.   Neurological: Negative.  Negative for tingling, weakness and paresthesias.    Allergies  Hydrocodone; Morphine; and Penicillins  Home Medications   Current Outpatient Rx  Name Route Sig Dispense Refill  . AMLODIPINE BESY-BENAZEPRIL HCL 10-20 MG PO CAPS  take 1 capsule by mouth once daily 30 capsule 3  . CALCIUM CARBONATE-VITAMIN D 500-200 MG-UNIT PO TABS Oral Take 1 tablet by mouth daily.      Marland Kitchen CAPSAICIN 0.025 % EX CREA Topical Apply topically 2 (two) times daily. Apply to affected area BID 60 g 0  . CLOTRIMAZOLE-BETAMETHASONE 1-0.05 % EX CREA Topical Apply topically 2 (two) times daily. apply generous amount to affected areas twice a day. please dispense 90g tube     . ESOMEPRAZOLE MAGNESIUM 20 MG PO PACK Oral Take 20 mg by mouth daily. 30 each 5  . FUROSEMIDE 40 MG PO TABS  take 1 and 1/2 tablets by mouth once daily 30 tablet 3  . ONE-DAILY MULTI VITAMINS PO TABS Oral Take 1 tablet by mouth daily.      Marland Kitchen SPIRONOLACTONE 25 MG PO TABS Oral Take 1 tablet (25 mg  total) by mouth daily. 30 tablet 1  . TRAMADOL-ACETAMINOPHEN 37.5-325 MG PO TABS Oral Take 1 tablet by mouth every 6 (six) hours as needed for pain. 60 tablet 1  . WARFARIN SODIUM 2.5 MG PO TABS Oral Take by mouth as directed.     Marland Kitchen MIRTAZAPINE 15 MG PO TABS Oral Take 15 mg by mouth daily.      Marland Kitchen PREDNISONE (PAK) 5 MG PO TABS  6 day pack Dispense: ONE (1) pak Take as directed, with food 21 tablet 0    BP 116/74  Pulse 94  Temp(Src) 98.4 F (36.9 C) (Oral)  Resp 19  SpO2 96%  Physical Exam  Nursing note and vitals reviewed. Constitutional: Bianca [name] is oriented to person, place, and time. Bianca [name] appears well-developed and well-nourished.  HENT:  Head: Normocephalic and atraumatic.  Eyes: EOM  are normal.  Neck: Normal range of motion.  Pulmonary/Chest: Effort normal.  Musculoskeletal: Normal range of motion.       Lumbar back: Bianca [name] exhibits tenderness and pain. Bianca [name] exhibits no bony tenderness.       Back:  Neurological: Bianca [name] is alert and oriented to person, place, and time.  Skin: Skin is warm and dry.  Psychiatric: Her behavior is normal.    ED Course  Procedures (including critical care time)  Labs Reviewed - No data to display No results found.   1. Sacroiliitis       MDM  5 mg prednisone dosepak        Richardo Priest, MD 01/19/11 1219

## 2011-01-19 NOTE — ED Notes (Addendum)
Started w/ left lateral neck stiffness w/ painful movements approx 2 wks ago, that has since moved to other areas of neck, and now down into low back and lower extremities; states pain is worse in buttocks right now.  Went to an urgent care last week and was given hydrocodone - states was unable to take due to it causing nausea.  Has tried old Tramadol Rx without relief.  Pt had been walking a lot as Jehovah's witness when pain started.

## 2011-01-24 ENCOUNTER — Encounter: Payer: PRIVATE HEALTH INSURANCE | Admitting: *Deleted

## 2011-01-29 ENCOUNTER — Ambulatory Visit (INDEPENDENT_AMBULATORY_CARE_PROVIDER_SITE_OTHER): Payer: PRIVATE HEALTH INSURANCE | Admitting: *Deleted

## 2011-01-29 DIAGNOSIS — I4891 Unspecified atrial fibrillation: Secondary | ICD-10-CM

## 2011-01-29 LAB — POCT INR: INR: 1.9

## 2011-02-26 ENCOUNTER — Ambulatory Visit (INDEPENDENT_AMBULATORY_CARE_PROVIDER_SITE_OTHER): Payer: Medicaid Other | Admitting: Pharmacist

## 2011-02-26 DIAGNOSIS — I4891 Unspecified atrial fibrillation: Secondary | ICD-10-CM

## 2011-02-26 DIAGNOSIS — Z7901 Long term (current) use of anticoagulants: Secondary | ICD-10-CM

## 2011-02-26 LAB — POCT INR: INR: 2.5

## 2011-03-23 ENCOUNTER — Other Ambulatory Visit: Payer: Self-pay | Admitting: Cardiology

## 2011-04-09 ENCOUNTER — Other Ambulatory Visit: Payer: Self-pay | Admitting: Family Medicine

## 2011-04-16 ENCOUNTER — Ambulatory Visit (INDEPENDENT_AMBULATORY_CARE_PROVIDER_SITE_OTHER): Payer: PRIVATE HEALTH INSURANCE | Admitting: *Deleted

## 2011-04-16 DIAGNOSIS — I4891 Unspecified atrial fibrillation: Secondary | ICD-10-CM

## 2011-04-16 DIAGNOSIS — Z7901 Long term (current) use of anticoagulants: Secondary | ICD-10-CM

## 2011-04-16 LAB — POCT INR: INR: 1.7

## 2011-04-17 ENCOUNTER — Other Ambulatory Visit: Payer: Self-pay | Admitting: Family Medicine

## 2011-04-17 DIAGNOSIS — Z1231 Encounter for screening mammogram for malignant neoplasm of breast: Secondary | ICD-10-CM

## 2011-04-22 ENCOUNTER — Other Ambulatory Visit: Payer: Self-pay | Admitting: Family Medicine

## 2011-04-29 ENCOUNTER — Ambulatory Visit
Admission: RE | Admit: 2011-04-29 | Discharge: 2011-04-29 | Disposition: A | Discharge status: Home / Self Care | Payer: PRIVATE HEALTH INSURANCE | Source: Ambulatory Visit | Attending: Family Medicine | Admitting: Family Medicine

## 2011-04-29 DIAGNOSIS — Z1231 Encounter for screening mammogram for malignant neoplasm of breast: Secondary | ICD-10-CM

## 2011-05-14 ENCOUNTER — Ambulatory Visit (INDEPENDENT_AMBULATORY_CARE_PROVIDER_SITE_OTHER): Payer: PRIVATE HEALTH INSURANCE

## 2011-05-14 DIAGNOSIS — I4891 Unspecified atrial fibrillation: Secondary | ICD-10-CM

## 2011-05-14 DIAGNOSIS — Z7901 Long term (current) use of anticoagulants: Secondary | ICD-10-CM

## 2011-05-22 ENCOUNTER — Encounter: Payer: Self-pay | Admitting: Family Medicine

## 2011-05-22 ENCOUNTER — Ambulatory Visit (INDEPENDENT_AMBULATORY_CARE_PROVIDER_SITE_OTHER): Payer: PRIVATE HEALTH INSURANCE | Admitting: Family Medicine

## 2011-05-22 VITALS — BP 152/81 | HR 90 | Temp 98.2°F | Ht 66.6 in | Wt 229.8 lb

## 2011-05-22 DIAGNOSIS — J392 Other diseases of pharynx: Secondary | ICD-10-CM | POA: Insufficient documentation

## 2011-05-22 DIAGNOSIS — R6889 Other general symptoms and signs: Secondary | ICD-10-CM

## 2011-05-22 NOTE — Assessment & Plan Note (Signed)
In the setting of + horseness, + cough, a few loose stools yesterday- most likely is viral.  Symptomatic treatment only.  Reviewed red flags with patient.  Pt to Return if no improvement within 1 week. or if new or worsening of symptoms.

## 2011-05-22 NOTE — Progress Notes (Signed)
  Subjective:    Patient ID: Bianca [name], female    DOB: [birth year], 78 y.o.   MRN: [id number]  HPI Viral symptoms: + Itchiness in throat,+ horseness,   + cough-mild (minimal productive cough)--  X 3 days.   no runny nose. No nasal congestion.  Some loose stools yesterday.  Not coughing up blood, no blood in stools, no dark stools,  No body aches.  No fever.  No eye itching.  + eye watering but is a chronic eye problem.  No h/o seasonal allergies.  Eating well, drinking well. No n/v.    Review of Systems As per above.     Objective:   Physical Exam  Constitutional: She appears well-developed and well-nourished. No distress.  HENT:  Right Ear: External ear normal.  Left Ear: External ear normal.  Nose: Nose normal.       Mild throat erythema.  No throat ulcerations. No drainage.   Eyes: Right eye exhibits no discharge. Left eye exhibits no discharge.  Neck: Neck supple.  Cardiovascular: Normal rate and normal heart sounds.   No murmur heard.      Irregular rhythm  Pulmonary/Chest: Effort normal. No respiratory distress. She has no wheezes. She has no rales.  Abdominal: Soft. She exhibits no distension. There is no tenderness. There is no rebound.  Musculoskeletal: She exhibits no edema.  Lymphadenopathy:    She has no cervical adenopathy.  Neurological: She is alert.  Skin: Skin is warm.  Psychiatric: She has a normal mood and affect.          Assessment & Plan:

## 2011-05-24 ENCOUNTER — Other Ambulatory Visit: Payer: Self-pay | Admitting: Family Medicine

## 2011-06-03 ENCOUNTER — Other Ambulatory Visit: Payer: Self-pay | Admitting: Family Medicine

## 2011-06-13 ENCOUNTER — Encounter: Payer: Self-pay | Admitting: *Deleted

## 2011-06-14 ENCOUNTER — Ambulatory Visit (INDEPENDENT_AMBULATORY_CARE_PROVIDER_SITE_OTHER): Payer: PRIVATE HEALTH INSURANCE | Admitting: Pharmacist

## 2011-06-14 ENCOUNTER — Telehealth: Payer: Self-pay | Admitting: Physician Assistant

## 2011-06-14 ENCOUNTER — Encounter: Payer: Self-pay | Admitting: Physician Assistant

## 2011-06-14 ENCOUNTER — Telehealth: Payer: Self-pay | Admitting: *Deleted

## 2011-06-14 ENCOUNTER — Ambulatory Visit (INDEPENDENT_AMBULATORY_CARE_PROVIDER_SITE_OTHER): Payer: PRIVATE HEALTH INSURANCE | Admitting: Physician Assistant

## 2011-06-14 VITALS — BP 144/86 | HR 86 | Ht 67.0 in | Wt 224.0 lb

## 2011-06-14 DIAGNOSIS — R079 Chest pain, unspecified: Secondary | ICD-10-CM

## 2011-06-14 DIAGNOSIS — I4891 Unspecified atrial fibrillation: Secondary | ICD-10-CM

## 2011-06-14 DIAGNOSIS — Z01810 Encounter for preprocedural cardiovascular examination: Secondary | ICD-10-CM

## 2011-06-14 DIAGNOSIS — I5032 Chronic diastolic (congestive) heart failure: Secondary | ICD-10-CM

## 2011-06-14 DIAGNOSIS — Z7901 Long term (current) use of anticoagulants: Secondary | ICD-10-CM

## 2011-06-14 DIAGNOSIS — I10 Essential (primary) hypertension: Secondary | ICD-10-CM

## 2011-06-14 LAB — BASIC METABOLIC PANEL
CO2: 27 mEq/L (ref 19–32)
Chloride: 106 mEq/L (ref 96–112)
Creatinine, Ser: 0.6 mg/dL (ref 0.4–1.2)
Potassium: 4.4 mEq/L (ref 3.5–5.1)

## 2011-06-14 LAB — POCT INR: INR: 2.8

## 2011-06-14 MED ORDER — ENOXAPARIN SODIUM 100 MG/ML ~~LOC~~ SOLN: 100.0000 mg | Freq: Two times a day (BID) | SUBCUTANEOUS | Status: DC

## 2011-06-14 NOTE — Telephone Encounter (Signed)
New problem:  Per [name] calling form [facility], patient  seen today by [name], plavix need to stop today - due to  epidureal injection on  6/7.

## 2011-06-14 NOTE — Telephone Encounter (Signed)
She is not on Plavix. She saw coumadin clinic to assist with Lovenox. They are helping with dosing coumadin, when to hold and when to start Lovenox. If specific questions regarding this, direct to coumadin clinic. [name], PA-C  12:03 PM 06/14/2011

## 2011-06-14 NOTE — Telephone Encounter (Signed)
Please see phone and Bing Neighbors. PAC message

## 2011-06-14 NOTE — Progress Notes (Signed)
[address] [place], [place]  [address] Phone: [phone] Fax:  [phone]  Date:  06/14/2011   Name:  Bianca [name]   DOB:  18-Nov-1930   MRN:  [id number]  PCP:  Barnabas Lister, MD, MD  Primary Cardiologist:  Dr. Valera Castle  Primary Electrophysiologist:  None    History of Present Illness: Bianca [name] is a 78 y.o. female who returns for follow up and for surgical clearance.    She has a history of HTN, permanent AFib, diastolic heart failure, GERD.  She had a Holter monitor in the past that demonstrated bradycardia and pauses and was taken off beta blockers/AV nodal blocking agents.  Echo 02/2008: EF 55%, MAC, mild MR, moderate BAE.  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  She last saw Dr. Daleen Squibb 02/2010.  Plan was for one year follow up.    She is overall doing well.  She denies significant changes in her shortness of breath.  She probably describes chronic class III symptoms.  She denies orthopnea or PND.  She denies significant changes in LE edema.  She does have occasional chest discomfort.  She describes this as indigestion.  She takes Nexium which helps.  It occurs after meals.  It started recently after being placed on prednisone for her back.  She denies syncope or near syncope.  She needs epidural steroid injections for radicular symptoms in her right leg.  The surgeon is Dr. Maurice Small.  This is scheduled for next week.  She has been instructed to stop her Coumadin today.  She needs clearance for stopping her Coumadin.  She has never had a stroke.  Her CHADS2 score is 3 and CHADS2-VASc is 5.  She is at moderate risk for thromboembolism while off anticoagulation and she is in permanent AFib.  HAS-BLED score is 2 giving her a risk of bleeding of 1.88 per 100 patient years.    Wt Readings from Last 3 Encounters:  05/22/11 229 lb 12.8 oz (104.237 kg)  11/19/10 232 lb (105.235 kg)  08/23/10 224 lb 9.6 oz (101.878 kg)     Potassium  Date/Time Value Range Status    06/05/2010 11:33 AM 3.9  3.5-5.3 (mEq/L) Final     Creat  Date/Time Value Range Status  06/05/2010 11:33 AM 0.73  0.40-1.20 (mg/dL) Final     Creatinine, Ser  Date/Time Value Range Status  01/31/2010  8:16 PM 0.65  0.40-1.20 (mg/dL) Final     ALT  Date/Time Value Range Status  06/05/2010 11:33 AM 19  0-35 (U/L) Final     TSH  Date/Time Value Range Status  06/05/2010 11:33 AM 1.452  0.350-4.500 (uIU/mL) Final     HGB  Date/Time Value Range Status  03/03/2008 11:12 AM 13.7  11.6-15.9 (g/dL) Final     Hemoglobin  Date/Time Value Range Status  06/05/2010 11:33 AM 12.9  12.0-15.0 (g/dL) Final    Past Medical History  Diagnosis Date  . Hypertension   . Atrial fibrillation   . Osteoarthritis   . GERD (gastroesophageal reflux disease)   . Long term current use of anticoagulant   . Chronic diastolic heart failure     Echo 02/2008: EF 55%, MAC, mild MR, moderate BAE  . Obesity   . Refusal of blood transfusions as patient is Jehovah's Witness     Current Outpatient Prescriptions  Medication Sig Dispense Refill  . amLODipine-benazepril (LOTREL) 10-20 MG per capsule take 1 capsule by mouth once daily  90  capsule  11  . calcium-vitamin D (OSCAL WITH D 500-200) 500-200 MG-UNIT per tablet Take 1 tablet by mouth daily.        . capsaicin (ZOSTRIX) 0.025 % cream Apply topically 2 (two) times daily. Apply to affected area BID  60 g  0  . clotrimazole-betamethasone (LOTRISONE) cream Apply topically 2 (two) times daily. apply generous amount to affected areas twice a day. please dispense 90g tube       . esomeprazole (NEXIUM) 20 MG packet Take 20 mg by mouth daily.  30 each  5  . furosemide (LASIX) 40 MG tablet take 1 and 1/2 tablets by mouth once daily  30 tablet  3  . mirtazapine (REMERON) 15 MG tablet Take 15 mg by mouth daily.        . Multiple Vitamin (MULTIVITAMIN) tablet Take 1 tablet by mouth daily.        Marland Kitchen NEXIUM 20 MG capsule take 1 capsule by mouth once daily  30 capsule  11   . predniSONE (STERAPRED UNI-PAK) 5 MG TABS 6 day pack Dispense: ONE (1) pak Take as directed, with food  21 tablet  0  . spironolactone (ALDACTONE) 25 MG tablet take 1 tablet by mouth once daily  30 tablet  0  . traMADol-acetaminophen (ULTRACET) 37.5-325 MG per tablet Take 1 tablet by mouth every 6 (six) hours as needed for pain.  60 tablet  1  . warfarin (COUMADIN) 2.5 MG tablet Take by mouth as directed.       . warfarin (COUMADIN) 5 MG tablet TAKE AS DIRECTED BY COUMADIN CLINIC  60 tablet  3    Allergies: Allergies  Allergen Reactions  . Hydrocodone Nausea And Vomiting  . Morphine     REACTION: itching  . Penicillins     REACTION: itching    History  Substance Use Topics  . Smoking status: Former Games developer  . Smokeless tobacco: Not on file  . Alcohol Use: No     ROS:  Please see the history of present illness.     All other systems reviewed and negative.   PHYSICAL EXAM: VS:  BP 144/86  Pulse 86  Ht 5\' 7"  (1.702 m)  Wt 224 lb (101.606 kg)  BMI 35.08 kg/m2 Well nourished, well developed, in no acute distress HEENT: normal Neck: no JVD Vascular: No carotid bruits Cardiac:  normal S1, S2; Irregularly irregular; no murmur Lungs:  clear to auscultation bilaterally, no wheezing, rhonchi or rales Abd: soft, nontender, no hepatomegaly Ext: no edema Skin: warm and dry Neuro:  CNs 2-12 intact, no focal abnormalities noted  EKG:  Atrial fibrillation, heart rate 86, left axis deviation, nonspecific ST-T wave changes, no change from prior tracings   ASSESSMENT AND PLAN:  1.  Permanent Atrial Fibrillation Rate controlled. She remains on Coumadin. She has a moderate risk of thromboembolism while off of anticoagulation and a fairly low bleeding risk. She is in permanent atrial fibrillation. She needs to be off of Coumadin for her spinal injection. I have recommended Lovenox bridging.  This will be arranged with our Coumadin clinic. Followup with Dr. Daleen Squibb in 6 months.  2.   Chronic Diastolic Congestive Heart Failure Volume stable. Continue current therapy. Followup as noted.  3.  Hypertension Somewhat elevated. She is in quite a bit of pain today and just took her medications. Continue monitoring.  4.  Lumbar DDD with Right Lower Extremity Radicular Symptoms She may proceed with her injection next week. She will be covered with  Lovenox while off Coumadin.  This will be stopped prior to her procedure and restarted afterward when felt to be safe by her surgeon.  5.  Chest Pain This is related to indigestion.  It seems to have been exacerbated by prednisone and is relieved by Nexium.  She is not having any exertional symptoms.  No cardiac workup is necessary.   Luna Glasgow, PA-C  8:38 AM 06/14/2011

## 2011-06-14 NOTE — Telephone Encounter (Signed)
Spoke with [employer] with Cathleen Fears.  She is aware of Lovenox instructions.  They need pt to have INR checked on 6/6 and request INR <1.3.  Spoke with pt.  We made an appt for 6/6 at 11:00

## 2011-06-14 NOTE — Telephone Encounter (Signed)
See phone note from [employer]

## 2011-06-14 NOTE — Telephone Encounter (Signed)
pt notified of lab results today 

## 2011-06-14 NOTE — Patient Instructions (Addendum)
5/31- Coumadin 1 tablet 6/1- Coumadin 1 1/2 tablets 6/2- No Coumadin or Lovenox  6/3- Lovenox 100mg  in PM  6/4- Lovenox 100mg  in AM and PM 6/5- Lovenox 100mg  in AM and PM 6/6- Lovenox 100mg  in AM only 6/7- Day of injection.  Do not take any Lovenox prior to your procedure.  After your procedure, take Coumadin 1 1/2 tablets 6/8- Coumadin 2 tablets AND Lovenox 100mg  in AM and PM 6/9- Coumadin 2 tablets AND Lovenox 100mg  in AM and PM 6/10- Coumadin 1 tablet AND Lovenox 100mg  in AM and PM 6/11- Coumadin 1 1/2 tablets AND Lovenox 100mg  in AM and PM 6/12- Recheck INR

## 2011-06-14 NOTE — Patient Instructions (Signed)
Your physician wants you to follow-up in: 6 MONTHS WITH DR WALL. You will receive a reminder letter in the mail two months in advance. If you don't receive a letter, please call our office to schedule the follow-up appointment.   BMET TODAY  NO CHANGES TODAY

## 2011-06-14 NOTE — Telephone Encounter (Signed)
Message copied by Tarri Fuller on Fri Jun 14, 2011  5:26 PM ------      Message from: [place], [place] T      Created: Fri Jun 14, 2011  1:32 PM       Please notify patient that the lab results are ok.      [name], PA-C  1:32 PM 06/14/2011

## 2011-06-15 ENCOUNTER — Other Ambulatory Visit: Payer: Self-pay | Admitting: Family Medicine

## 2011-06-17 ENCOUNTER — Emergency Department (HOSPITAL_COMMUNITY)
Admission: EM | Admit: 2011-06-17 | Discharge: 2011-06-17 | Disposition: A | Discharge status: Home Health | Payer: PRIVATE HEALTH INSURANCE | Attending: Emergency Medicine | Admitting: Emergency Medicine

## 2011-06-17 ENCOUNTER — Encounter (HOSPITAL_COMMUNITY): Payer: Self-pay | Admitting: Emergency Medicine

## 2011-06-17 DIAGNOSIS — I4891 Unspecified atrial fibrillation: Secondary | ICD-10-CM | POA: Insufficient documentation

## 2011-06-17 DIAGNOSIS — I10 Essential (primary) hypertension: Secondary | ICD-10-CM | POA: Insufficient documentation

## 2011-06-17 DIAGNOSIS — M436 Torticollis: Secondary | ICD-10-CM

## 2011-06-17 DIAGNOSIS — Z79899 Other long term (current) drug therapy: Secondary | ICD-10-CM | POA: Insufficient documentation

## 2011-06-17 DIAGNOSIS — K219 Gastro-esophageal reflux disease without esophagitis: Secondary | ICD-10-CM | POA: Insufficient documentation

## 2011-06-17 DIAGNOSIS — Z7901 Long term (current) use of anticoagulants: Secondary | ICD-10-CM | POA: Insufficient documentation

## 2011-06-17 MED ORDER — KETOROLAC TROMETHAMINE 30 MG/ML IJ SOLN
30.0000 mg | Freq: Once | INTRAMUSCULAR | Status: AC
  Administered 2011-06-17: 30 mg via INTRAMUSCULAR
  Filled 2011-06-17: qty 1

## 2011-06-17 MED ORDER — TRAMADOL HCL 50 MG PO TABS: 50.0000 mg | ORAL_TABLET | Freq: Four times a day (QID) | ORAL | Status: AC | PRN

## 2011-06-17 MED ORDER — OXYCODONE-ACETAMINOPHEN 5-325 MG PO TABS: 1.0000 | ORAL_TABLET | Freq: Four times a day (QID) | ORAL | Status: AC | PRN

## 2011-06-17 NOTE — Discharge Instructions (Signed)
Torticollis, Acute You have suddenly (acutely) developed a twisted neck (torticollis). This is usually a self-limited condition. CAUSES  Acute torticollis may be caused by malposition, trauma or infection. Most commonly, acute torticollis is caused by sleeping in an awkward position. Torticollis may also be caused by the flexion, extension or twisting of the neck muscles beyond their normal position. Sometimes, the exact cause may not be known. SYMPTOMS  Usually, there is pain and limited movement of the neck. Your neck may twist to one side. DIAGNOSIS  The diagnosis is often made by physical examination. X-rays, CT scans or MRIs may be done if there is a history of trauma or concern of infection. TREATMENT  For a common, stiff neck that develops during sleep, treatment is focused on relaxing the contracted neck muscle. Medications (including shots) may be used to treat the problem. Most cases resolve in several days. Torticollis usually responds to conservative physical therapy. If left untreated, the shortened and spastic neck muscle can cause deformities in the face and neck. Rarely, surgery is required. HOME CARE INSTRUCTIONS   Use over-the-counter and prescription medications as directed by your caregiver.   Do stretching exercises and massage the neck as directed by your caregiver.   Follow up with physical therapy if needed and as directed by your caregiver.  SEEK IMMEDIATE MEDICAL CARE IF:   You develop difficulty breathing or noisy breathing (stridor).   You drool, develop trouble swallowing or have pain with swallowing.   You develop numbness or weakness in the hands or feet.   You have changes in speech or vision.   You have problems with urination or bowel movements.   You have difficulty walking.   You have a fever.   You have increased pain.  MAKE SURE YOU:   Understand these instructions.   Will watch your condition.   Will get help right away if you are not  doing well or get worse.  Document Released: 12/29/1999 Document Revised: 12/20/2010 Document Reviewed: 02/08/2009 Exit[employer] Patient Information 2012 [employer]. 

## 2011-06-17 NOTE — ED Provider Notes (Addendum)
History     CSN: [id number] Arrival date & time 06/17/11  1419 First MD Initiated Contact with Patient 06/17/11 1505    Chief Complaint  Patient presents with  . Neck Pain    HPI Comments: The neck pain started Sunday.  She has not been sleeping well due to pain in her back for the last month.  Patient is a 78 y.o. female presenting with neck pain. The history is provided by the patient.  Neck Pain  This is a new problem. The current episode started more than 1 week ago (one month ago). The problem has been gradually improving. The pain is associated with an unknown factor. There has been no fever. The pain is severe. The symptoms are aggravated by twisting and position. Pertinent negatives include no chest pain, no numbness and no weakness.  Pt has had trouble with pain in her neck before.  She has been told that she has spasms.  Pt also has history of disc problems in her back.Pt did not have any medications for pain.  Pt is scheduled for a procedure last this week and was told to not take tylenol.  Pt tried to call her doctor but no one called her back.  Pt denies any numbness in her hands.  She had her coumadin level checked on Friday and it was fine.  She stopped taking it a few days ago.   Past Medical History  Diagnosis Date  . Hypertension   . Atrial fibrillation   . Osteoarthritis   . GERD (gastroesophageal reflux disease)   . Long term current use of anticoagulant   . Chronic diastolic heart failure     Echo 02/2008: EF 55%, MAC, mild MR, moderate BAE  . Obesity   . Refusal of blood transfusions as patient is Jehovah's Witness     Past Surgical History  Procedure Date  . Cholecystectomy   . Abdominal hysterectomy   . Tubal ligation   . Knee arthroscopy     bilat knees  . Carpal tunnel release     Family History  Problem Relation Age of Onset  . Depression Daughter   . Asthma Mother   . Colon cancer Mother   . Diabetes      sibling  . Hypertension      sibling      History  Substance Use Topics  . Smoking status: Former Games developer  . Smokeless tobacco: Not on file  . Alcohol Use: No    OB History    Grav Para Term Preterm Abortions TAB SAB Ect Mult Living                  Review of Systems  Constitutional: Positive for fever.  HENT: Positive for neck pain.   Respiratory: Negative for cough and chest tightness.   Cardiovascular: Negative for chest pain.  Genitourinary: Negative for dysuria.  Neurological: Negative for weakness and numbness.  All other systems reviewed and are negative.    Allergies  Hydrocodone; Morphine; and Penicillins  Home Medications   Current Outpatient Rx  Name Route Sig Dispense Refill  . AMLODIPINE BESY-BENAZEPRIL HCL 10-20 MG PO CAPS  take 1 capsule by mouth once daily 90 capsule 11  . CALCIUM CARBONATE-VITAMIN D 500-200 MG-UNIT PO TABS Oral Take 1 tablet by mouth daily.      Marland Kitchen CLOTRIMAZOLE-BETAMETHASONE 1-0.05 % EX CREA Topical Apply topically 2 (two) times daily. apply generous amount to affected areas twice a day. please dispense 90g tube     .  ENOXAPARIN SODIUM 100 MG/ML [place] SOLN Subcutaneous Inject 1 mL (100 mg total) into the skin every 12 (twelve) hours. 20 Syringe 0  . FUROSEMIDE 40 MG PO TABS      . ONE-DAILY MULTI VITAMINS PO TABS Oral Take 1 tablet by mouth daily.      Marland Kitchen NEXIUM 20 MG PO CPDR  take 1 capsule by mouth once daily 30 capsule 11  . SPIRONOLACTONE 25 MG PO TABS  take 1 tablet by mouth once daily 30 tablet 0  . WARFARIN SODIUM 2.5 MG PO TABS Oral Take by mouth as directed.     . WARFARIN SODIUM 5 MG PO TABS  TAKE AS DIRECTED BY COUMADIN CLINIC 60 tablet 3    BP 140/68  Pulse 95  Temp(Src) 98.6 F (37 C) (Oral)  Resp 16  SpO2 99%  Physical Exam  Nursing note and vitals reviewed. Constitutional: She appears well-developed and well-nourished. No distress.  HENT:  Head: Normocephalic and atraumatic.  Right Ear: External ear normal.  Left Ear: External ear normal.  Eyes:  Conjunctivae are normal. Right eye exhibits no discharge. Left eye exhibits no discharge. No scleral icterus.  Neck: Trachea normal. Neck supple. Muscular tenderness present. No rigidity. Decreased range of motion present. No tracheal deviation and no erythema present. No mass present.  Cardiovascular: Normal rate, regular rhythm and intact distal pulses.   Pulmonary/Chest: Effort normal and breath sounds normal. No stridor. No respiratory distress. She has no wheezes. She has no rales.  Abdominal: Soft. Bowel sounds are normal. She exhibits no distension. There is no tenderness. There is no rebound and no guarding.  Musculoskeletal: She exhibits no edema and no tenderness.  Neurological: She is alert. She has normal strength. She displays no atrophy. No sensory deficit. Cranial nerve deficit:  no gross defecits noted. She exhibits normal muscle tone. She displays no seizure activity. Coordination normal.       5/5 grip strength bilaterally  Skin: Skin is warm and dry. No rash noted.  Psychiatric: She has a normal mood and affect.    ED Course  Procedures (including critical care time)  Date: 06/17/2011  Rate: 91  Rhythm: atrial fibrillation  QRS Axis: left  Intervals: normal  ST/T Wave abnormalities: normal  Conduction Disutrbances:left anterior fascicular block  Narrative Interpretation: LVH changes are new  Old EKG Reviewed: changes noted   Labs Reviewed - No data to display No results found.   MDM  Pt has reproducible neck tenderness.  Has history of DDD in cervical and lumbar spine (old xrays in 2011 demonstrate ddd in c spine.)  Pt has not been taking anything for pain.  Requests a shot in the ED but is allergic to morphine.  Cannot tell me if she has other allergies.  Not taking coumadin currently.  Will give one time low dose toradol injection.  Will dc home with ultram prescription.       Celene Kras, MD 06/17/11 1541  Pt requested an alternative prescription to  ultram.  I prescribed percocet.  Explained that she could have the same nausea problem.  Celene Kras, MD 06/17/11 [phone]

## 2011-06-17 NOTE — ED Notes (Addendum)
Pt presenting to ed with c/o left side neck pain since yesterday. Pt denies chest pain pt denies radiating pain at this time. Pt denies nausea and vomiting at this time. Pt states she was unable to sleep and cried all night long. Pt denies injury to neck at this time. Pt denies headache pain. Pt is alert and oriented at this time

## 2011-06-20 ENCOUNTER — Ambulatory Visit (INDEPENDENT_AMBULATORY_CARE_PROVIDER_SITE_OTHER): Payer: PRIVATE HEALTH INSURANCE | Admitting: *Deleted

## 2011-06-20 DIAGNOSIS — Z7901 Long term (current) use of anticoagulants: Secondary | ICD-10-CM

## 2011-06-20 DIAGNOSIS — I4891 Unspecified atrial fibrillation: Secondary | ICD-10-CM

## 2011-06-20 NOTE — Patient Instructions (Signed)
6/6- Lovenox 100mg  in AM only  6/7- Day of injection. Do not take any Lovenox prior to your procedure. After your procedure, take Coumadin 1 1/2 tablets  6/8- Coumadin 2 tablets AND Lovenox 100mg  in AM and PM  6/9- Coumadin 2 tablets AND Lovenox 100mg  in AM and PM  6/10- Coumadin 1 tablet AND Lovenox 100mg  in AM and PM  6/11- Coumadin 1 1/2 tablets AND Lovenox 100mg  in AM and PM  6/12- Recheck INR

## 2011-06-26 ENCOUNTER — Ambulatory Visit (INDEPENDENT_AMBULATORY_CARE_PROVIDER_SITE_OTHER): Payer: PRIVATE HEALTH INSURANCE | Admitting: Pharmacist

## 2011-06-26 DIAGNOSIS — I4891 Unspecified atrial fibrillation: Secondary | ICD-10-CM

## 2011-06-26 DIAGNOSIS — Z7901 Long term (current) use of anticoagulants: Secondary | ICD-10-CM

## 2011-06-26 LAB — POCT INR: INR: 1.8

## 2011-07-03 ENCOUNTER — Other Ambulatory Visit: Payer: Self-pay | Admitting: Family Medicine

## 2011-07-16 ENCOUNTER — Encounter: Payer: Self-pay | Admitting: Family Medicine

## 2011-07-16 ENCOUNTER — Ambulatory Visit (INDEPENDENT_AMBULATORY_CARE_PROVIDER_SITE_OTHER): Payer: PRIVATE HEALTH INSURANCE | Admitting: Family Medicine

## 2011-07-16 VITALS — BP 117/79 | HR 99 | Temp 98.6°F | Ht 67.0 in | Wt 227.0 lb

## 2011-07-16 DIAGNOSIS — R609 Edema, unspecified: Secondary | ICD-10-CM

## 2011-07-16 DIAGNOSIS — R600 Localized edema: Secondary | ICD-10-CM

## 2011-07-16 DIAGNOSIS — I10 Essential (primary) hypertension: Secondary | ICD-10-CM

## 2011-07-16 DIAGNOSIS — Z13220 Encounter for screening for lipoid disorders: Secondary | ICD-10-CM | POA: Insufficient documentation

## 2011-07-16 LAB — LIPID PANEL
HDL: 40 mg/dL (ref >39)
Total CHOL/HDL Ratio: 4 Ratio
Triglycerides: 107 mg/dL (ref <150)

## 2011-07-16 MED ORDER — CARVEDILOL 6.25 MG PO TABS: 6.2500 mg | ORAL_TABLET | Freq: Two times a day (BID) | ORAL | Status: DC

## 2011-07-16 MED ORDER — BENAZEPRIL HCL 20 MG PO TABS: 20.0000 mg | ORAL_TABLET | Freq: Every day | ORAL | Status: DC

## 2011-07-16 MED ORDER — CLOTRIMAZOLE-BETAMETHASONE 1-0.05 % EX CREA: TOPICAL_CREAM | Freq: Two times a day (BID) | CUTANEOUS | Status: DC

## 2011-07-16 NOTE — Progress Notes (Signed)
  Subjective:    Patient ID: Bianca [name], female    DOB: Mar 18, 1930, 78 y.o.   MRN: [id number]  HPI  Hypertension: BP well controlled today on current regimen.  Patient denies any CP, SOB, numbness/tingling of extremities, fatigue or dizziness.  She takes her BP medications daily and has no complaints of side effects.  Patient would like to have lipid panel checked today.  Takes Amlodipine-Benazepril, Lasix, Spironolactone for HTN.  Peripheral edema: Patient has chronic pedal edema, but today it looks worse.  She admits to eating salty foods and does not elevate her feet while resting.  Patient has been thinking about purchasing compression hose, but they are expensive.  She denies any pain or skin breakdown.  Denies any tingling or decreased sensation of bilateral LE.  Denies any SOB, dyspnea.  Currently, takes Lasix and Spironolactone for CHF.  Review of Systems Per HPI     Objective:   Physical Exam  Constitutional: No distress.  Cardiovascular: An irregularly irregular rhythm present.  Pulmonary/Chest: Effort normal and breath sounds normal. No respiratory distress. She has no wheezes. She has no rales.  Abdominal: Soft. Bowel sounds are normal.  Musculoskeletal:       3+ pitting pedal edema from feet to ankles to anterior shin bilaterally; no pain, erythema, or skin breakdown; no calf tenderness          Assessment & Plan:

## 2011-07-16 NOTE — Assessment & Plan Note (Signed)
Normal lipid panel one year ago, but with history of CHF, HTN, and obesity, I will repeat lipid panel today and start Statin if necessary.

## 2011-07-16 NOTE — Assessment & Plan Note (Addendum)
Peripheral edema likely secondary to CHF fluid-overload, however BP is stable. Stop Amlodipine and start Coreg 6.25 BID.  Continue ACEi, Lasix, and Spironolactone. Encouraged patient to purchase compression house and wear during the day and elevate legs over heart at night. If swelling becomes worse, she may take a second dose of Lasix.  Red flags reviewed.

## 2011-07-16 NOTE — Assessment & Plan Note (Signed)
BP well controlled, but will D/C Amlodipine due to pedal edema.  Start Coreg 12.5 mg BID.

## 2011-07-16 NOTE — Patient Instructions (Addendum)
It was great to see you today, Bianca Dandy. Your blood pressure looks great today, but your legs are swelling. Please purchase compression hose and wear them during the day. At bedtime, please elevate your legs over your heart while you sleep. If swelling worsens, you may take an additional Lasix tablet to help decrease swelling. STOP taking Amlodipine-Benazepril.  I sent 2 new blood pressure medications to your pharmacy and take them as directed. If you develop difficulty breathing, wet cough, or chest pain, PLEASE CALL MD or GO TO ED. Schedule follow up appointment with me in 3 months or sooner as needed.  DASH Diet The DASH diet stands for "Dietary Approaches to Stop Hypertension." It is a healthy eating plan that has been shown to reduce high blood pressure (hypertension) in as little as 14 days, while also possibly providing other significant health benefits. These other health benefits include reducing the risk of breast cancer after menopause and reducing the risk of type 2 diabetes, heart disease, colon cancer, and stroke. Health benefits also include weight loss and slowing kidney failure in patients with chronic kidney disease.  DIET GUIDELINES  Limit salt (sodium). Your diet should contain less than 1500 mg of sodium daily.   Limit refined or processed carbohydrates. Your diet should include mostly whole grains. Desserts and added sugars should be used sparingly.   Include small amounts of heart-healthy fats. These types of fats include nuts, oils, and tub margarine. Limit saturated and trans fats. These fats have been shown to be harmful in the body.  CHOOSING FOODS  The following food groups are based on a 2000 calorie diet. See your Registered Dietitian for individual calorie needs. Grains and Grain Products (6 to 8 servings daily)  Eat More Often: Whole-wheat bread, brown rice, whole-grain or wheat pasta, quinoa, popcorn without added fat or salt (air popped).   Eat Less Often: White  bread, white pasta, white rice, cornbread.  Vegetables (4 to 5 servings daily)  Eat More Often: Fresh, frozen, and canned vegetables. Vegetables may be raw, steamed, roasted, or grilled with a minimal amount of fat.   Eat Less Often/Avoid: Creamed or fried vegetables. Vegetables in a cheese sauce.  Fruit (4 to 5 servings daily)  Eat More Often: All fresh, canned (in natural juice), or frozen fruits. Dried fruits without added sugar. One hundred percent fruit juice ( cup [237 mL] daily).   Eat Less Often: Dried fruits with added sugar. Canned fruit in light or heavy syrup.  [employer], Fish, and Poultry (2 servings or less daily. One serving is 3 to 4 oz [85-114 g]).  Eat More Often: Ninety percent or leaner ground beef, tenderloin, sirloin. Round cuts of beef, chicken breast, [place] breast. All fish. Grill, bake, or broil your meat. Nothing should be fried.   Eat Less Often/Avoid: Fatty cuts of meat, [place], or chicken leg, thigh, or wing. Fried cuts of meat or fish.  Dairy (2 to 3 servings)  Eat More Often: Low-fat or fat-free milk, low-fat plain or light yogurt, reduced-fat or part-skim cheese.   Eat Less Often/Avoid: Milk (whole, 2%, skim, or chocolate).Whole milk yogurt. Full-fat cheeses.  Nuts, Seeds, and Legumes (4 to 5 servings per week)  Eat More Often: All without added salt.   Eat Less Often/Avoid: Salted nuts and seeds, canned beans with added salt.  Fats and Sweets (limited)  Eat More Often: Vegetable oils, tub margarines without trans fats, sugar-free gelatin. Mayonnaise and salad dressings.   Eat Less Often/Avoid: Coconut oils,  palm oils, butter, stick margarine, cream, half and half, cookies, candy, pie.  FOR MORE INFORMATION The Dash Diet Eating Plan: www.dashdiet.org Document Released: 12/20/2010 Document Reviewed: 12/10/2010 [facility] Patient Information 2012 [place], [place].

## 2011-07-17 ENCOUNTER — Encounter: Payer: Self-pay | Admitting: Family Medicine

## 2011-07-17 ENCOUNTER — Ambulatory Visit (INDEPENDENT_AMBULATORY_CARE_PROVIDER_SITE_OTHER): Payer: PRIVATE HEALTH INSURANCE | Admitting: Pharmacist

## 2011-07-17 DIAGNOSIS — I4891 Unspecified atrial fibrillation: Secondary | ICD-10-CM

## 2011-07-17 DIAGNOSIS — Z7901 Long term (current) use of anticoagulants: Secondary | ICD-10-CM

## 2011-07-17 LAB — POCT INR: INR: 2.6

## 2011-07-29 ENCOUNTER — Other Ambulatory Visit: Payer: Self-pay | Admitting: Cardiology

## 2011-08-14 ENCOUNTER — Ambulatory Visit (INDEPENDENT_AMBULATORY_CARE_PROVIDER_SITE_OTHER): Payer: PRIVATE HEALTH INSURANCE | Admitting: *Deleted

## 2011-08-14 DIAGNOSIS — Z7901 Long term (current) use of anticoagulants: Secondary | ICD-10-CM

## 2011-08-14 DIAGNOSIS — I4891 Unspecified atrial fibrillation: Secondary | ICD-10-CM

## 2011-09-12 ENCOUNTER — Ambulatory Visit (INDEPENDENT_AMBULATORY_CARE_PROVIDER_SITE_OTHER): Payer: PRIVATE HEALTH INSURANCE | Admitting: Pharmacist

## 2011-09-12 DIAGNOSIS — I4891 Unspecified atrial fibrillation: Secondary | ICD-10-CM

## 2011-09-12 DIAGNOSIS — Z7901 Long term (current) use of anticoagulants: Secondary | ICD-10-CM

## 2011-10-23 ENCOUNTER — Ambulatory Visit (INDEPENDENT_AMBULATORY_CARE_PROVIDER_SITE_OTHER): Payer: PRIVATE HEALTH INSURANCE | Admitting: Pharmacist

## 2011-10-23 DIAGNOSIS — I4891 Unspecified atrial fibrillation: Secondary | ICD-10-CM

## 2011-10-23 DIAGNOSIS — Z7901 Long term (current) use of anticoagulants: Secondary | ICD-10-CM

## 2011-11-05 ENCOUNTER — Other Ambulatory Visit: Payer: Self-pay | Admitting: Family Medicine

## 2011-11-13 ENCOUNTER — Other Ambulatory Visit: Payer: Self-pay | Admitting: Family Medicine

## 2011-11-17 ENCOUNTER — Other Ambulatory Visit: Payer: Self-pay | Admitting: Cardiology

## 2011-12-04 ENCOUNTER — Encounter (INDEPENDENT_AMBULATORY_CARE_PROVIDER_SITE_OTHER): Payer: PRIVATE HEALTH INSURANCE | Admitting: *Deleted

## 2011-12-04 ENCOUNTER — Ambulatory Visit (INDEPENDENT_AMBULATORY_CARE_PROVIDER_SITE_OTHER): Payer: PRIVATE HEALTH INSURANCE | Admitting: Physician Assistant

## 2011-12-04 ENCOUNTER — Encounter: Payer: Self-pay | Admitting: Physician Assistant

## 2011-12-04 VITALS — BP 152/100 | HR 73 | Ht 66.0 in | Wt 229.8 lb

## 2011-12-04 DIAGNOSIS — Z7901 Long term (current) use of anticoagulants: Secondary | ICD-10-CM

## 2011-12-04 DIAGNOSIS — I10 Essential (primary) hypertension: Secondary | ICD-10-CM

## 2011-12-04 DIAGNOSIS — I4891 Unspecified atrial fibrillation: Secondary | ICD-10-CM

## 2011-12-04 DIAGNOSIS — I5032 Chronic diastolic (congestive) heart failure: Secondary | ICD-10-CM

## 2011-12-04 LAB — POCT INR: INR: 2.2

## 2011-12-04 MED ORDER — BENAZEPRIL HCL 20 MG PO TABS: ORAL_TABLET | ORAL | Status: DC

## 2011-12-04 NOTE — Patient Instructions (Addendum)
INCREASE BENAZEPRIL TO 20 MG IN THE MORNING AND 10 MG IN THE EVENING  LAB WORK 12/30/11 Friday 8:30-4:30  Your physician wants you to follow-up in: 6 MONTHS WITH DR. WALL. You will receive a reminder letter in the mail two months in advance. If you don't receive a letter, please call our office to schedule the follow-up appointment.

## 2011-12-04 NOTE — Progress Notes (Signed)
[address] [place], [place]  [address] Phone: [phone]; Fax:  [phone]  Date:  12/04/2011   Name:  Bianca [name]   DOB:  10-24-1930   MRN:  [id number]  PCP:  DE LA CRUZ,IVY, DO  Primary Cardiologist:  Dr. Valera Castle  Primary Electrophysiologist:  None    History of Present Illness: Bianca [name] is a 78 y.o. female who returns for follow up.    She has a history of HTN, permanent AFib, diastolic heart failure, GERD.  She had a Holter monitor in the past that demonstrated bradycardia and pauses and was taken off beta blockers/AV nodal blocking agents.  Echo 02/2008: EF 55%, MAC, mild MR, moderate BAE.  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  She last saw Dr. Daleen Squibb 02/2010.   I saw her in 5/13 prior to undergoing ESI for Lumbar DDD.  She states her symptoms are much improved.  The patient denies chest pain, significant shortness of breath (Class IIb), syncope, orthopnea, PND or significant pedal edema.  She was taken off of amlodipine and has noted improved LE edema.  She is now on Coreg and her HR seems to be stable.  Labs (5/13):   K 4.4, creatinine 0.6  Labs (7/13):   LDL 97  Wt Readings from Last 3 Encounters:  12/04/11 229 lb 12.8 oz (104.237 kg)  07/16/11 227 lb (102.967 kg)  06/14/11 224 lb (101.606 kg)     Past Medical History  Diagnosis Date  . Hypertension   . Atrial fibrillation   . Osteoarthritis   . GERD (gastroesophageal reflux disease)   . Long term current use of anticoagulant   . Chronic diastolic heart failure     Echo 02/2008: EF 55%, MAC, mild MR, moderate BAE  . Obesity   . Refusal of blood transfusions as patient is Jehovah's Witness     Current Outpatient Prescriptions  Medication Sig Dispense Refill  . benazepril (LOTENSIN) 20 MG tablet take 1 tablet by mouth once daily  30 tablet  3  . calcium-vitamin D (OSCAL WITH D 500-200) 500-200 MG-UNIT per tablet Take 1 tablet by mouth daily.        . carvedilol (COREG) 6.25 MG tablet  take 1 tablet by mouth twice a day WITH FOOD  90 tablet  3  . celecoxib (CELEBREX) 200 MG capsule Take 200 mg by mouth daily.      . clotrimazole-betamethasone (LOTRISONE) cream Apply topically 2 (two) times daily. apply generous amount to affected areas twice a day. please dispense 90g tube  30 g  6  . enoxaparin (LOVENOX) 100 MG/ML injection Inject 1 mL (100 mg total) into the skin every 12 (twelve) hours.  20 Syringe  0  . furosemide (LASIX) 40 MG tablet Take 40 mg by mouth daily.       . Multiple Vitamin (MULTIVITAMIN) tablet Take 1 tablet by mouth daily.        Marland Kitchen NEXIUM 20 MG capsule take 1 capsule by mouth once daily  30 capsule  11  . spironolactone (ALDACTONE) 25 MG tablet take 1 tablet by mouth once daily  30 tablet  6  . warfarin (COUMADIN) 2.5 MG tablet Take 0-2.5 mg by mouth See admin instructions. Patient takes 7.5mg  on Sun, Tues, Thurs, Sat and 5mg  on Mon, Wed, Fri.      . warfarin (COUMADIN) 5 MG tablet Take 5 mg by mouth See admin instructions. Patient takes 7.5mg  on Sun, Tues, Thurs,  Sat and 5mg  on Mon, Wed, Fri.      . warfarin (COUMADIN) 5 MG tablet TAKE AS DIRECTED BY COUMADIN CLINIC  45 tablet  3    Allergies: Allergies  Allergen Reactions  . Hydrocodone Nausea And Vomiting  . Morphine     REACTION: itching  . Penicillins     REACTION: itching    Social History:  The patient  reports that she has quit smoking. She does not have any smokeless tobacco history on file. She reports that she does not drink alcohol or use illicit drugs.    ROS:  Please see the history of present illness.  All other systems reviewed and negative.   PHYSICAL EXAM: VS:  BP 152/100  Pulse 73  Ht 5\' 6"  (1.676 m)  Wt 229 lb 12.8 oz (104.237 kg)  BMI 37.09 kg/m2 Repeat BP by me 142/90  Well nourished, well developed, in no acute distress HEENT: normal Neck: no JVD Vascular: No carotid bruits Cardiac:  normal S1, S2; Irregularly irregular; no murmur Lungs:  clear to auscultation  bilaterally, no wheezing, rhonchi or rales Abd: soft, nontender, no hepatomegaly Ext: no edema Skin: warm and dry Neuro:  CNs 2-12 intact, no focal abnormalities noted  EKG:  AFib, HR 73, TWI 2, 3, aVF, V4-6 - ?LVH with repol  ASSESSMENT AND PLAN:  1.  Permanent Atrial Fibrillation:  Rate controlled.  She is followed in the coumadin clinic.    2.  Chronic Diastolic Congestive Heart Failure:   Volume stable.  Continue current Rx.  3.  Hypertension:   Uncontrolled.  Adjust Benazepril to 20 mg in A and 10 mg in P.  I would not adjust Coreg any further with her hx of bradycardia.  Check BMET in 1-2 weeks.  4. Disposition:  Follow up with me or Dr. Valera Castle in 6 mos.   Luna Glasgow, PA-C  10:28 AM 12/04/2011

## 2011-12-06 ENCOUNTER — Ambulatory Visit: Payer: PRIVATE HEALTH INSURANCE | Admitting: Physician Assistant

## 2011-12-09 ENCOUNTER — Encounter: Payer: Self-pay | Admitting: Family Medicine

## 2011-12-09 ENCOUNTER — Ambulatory Visit (INDEPENDENT_AMBULATORY_CARE_PROVIDER_SITE_OTHER): Payer: PRIVATE HEALTH INSURANCE | Admitting: Family Medicine

## 2011-12-09 VITALS — BP 180/110 | HR 76 | Temp 98.1°F | Ht 66.5 in | Wt 230.5 lb

## 2011-12-09 DIAGNOSIS — I10 Essential (primary) hypertension: Secondary | ICD-10-CM

## 2011-12-09 LAB — BASIC METABOLIC PANEL
CO2: 30 mEq/L (ref 19–32)
Chloride: 103 mEq/L (ref 96–112)
Potassium: 4.5 mEq/L (ref 3.5–5.3)
Sodium: 140 mEq/L (ref 135–145)

## 2011-12-09 NOTE — Patient Instructions (Addendum)
Your goal is for 30 mg a day of the Benazepril (Lotensin).  Take 2 pills in the AM and 1 pill in the PM of the medicine the heart doctor gave you.  This will get you to 30 mg.  Take your medicine when you get home.  Come back in 2 weeks for a blood pressure recheck.

## 2011-12-09 NOTE — Assessment & Plan Note (Addendum)
Spent about 10 minutes explaining that 30 mg was goal for her Benazepril. No red flags with her headache, no other signs of end organ damage She expressed understanding after conversation.   Minimally improved on my recheck.  She is to take her BP meds when she returns home today.  FU in 2 weeks for BP recheck.  BMET today since increase in Benazepril.    I took her 20 mg bottle from her, she does not like cutting pills and would rather take 3 pills daily for 30 mg than 1.5 daily.

## 2011-12-09 NOTE — Progress Notes (Signed)
Patient ID: Bianca [name], female   DOB: [birth year], 78 y.o.   MRN: [id number] Bianca [name] is a 78 y.o. female who presents to [facility] today for FU for HTN:  1.  Hypertension:  Long-term problem for this patient.  She had her Lisinopril increased to 20 mg last OV with PCP, stopped Amlodipine due to LE edema, and was started on Coreg.  This was back in July.  She was seen at Cardiologist appt last week and had Lisinopril increased to 30 mg daily.  She was taking 1.5 tabs of 20 mg daily to add up to 30 total.  This was confusing for her in that she had 2 bottles at home, one of 20 mg and one of 10 mg which she was supposed to take 2 in AM and 1 in PM for 30 mg total.  Did not take her medications this AM.  Does complain of mild HA, no visual changes.  Always has HA whenever she has increased BP.   No CP, dizziness, shortness of breath, palpitations, or LE swelling.   BP Readings from Last 3 Encounters:  12/09/11 180/110  12/04/11 152/100  07/16/11 117/79     The following portions of the patient's history were reviewed and updated as appropriate: allergies, current medications, past medical history, family and social history, and problem list.  Patient is a nonsmoker.  Past Medical History  Diagnosis Date  . Hypertension   . Atrial fibrillation   . Osteoarthritis   . GERD (gastroesophageal reflux disease)   . Long term current use of anticoagulant   . Chronic diastolic heart failure     Echo 02/2008: EF 55%, MAC, mild MR, moderate BAE  . Obesity   . Refusal of blood transfusions as patient is Jehovah's Witness     ROS as above otherwise neg. No Chest pain, palpitations, SOB, Fever, Chills, Abd pain, N/V/D.  Medications reviewed. Current Outpatient Prescriptions  Medication Sig Dispense Refill  . benazepril (LOTENSIN) 20 MG tablet TAKE 20 MG IN THE MORNING AND TAKE 10 MG IN THE EVENING  45 tablet  11  . calcium-vitamin D (OSCAL WITH D 500-200) 500-200 MG-UNIT per tablet Take 1 tablet by mouth  daily.        . carvedilol (COREG) 6.25 MG tablet take 1 tablet by mouth twice a day WITH FOOD  90 tablet  3  . celecoxib (CELEBREX) 200 MG capsule Take 200 mg by mouth daily.      . clotrimazole-betamethasone (LOTRISONE) cream Apply topically 2 (two) times daily. apply generous amount to affected areas twice a day. please dispense 90g tube  30 g  6  . furosemide (LASIX) 40 MG tablet Take 40 mg by mouth daily.       . Multiple Vitamin (MULTIVITAMIN) tablet Take 1 tablet by mouth daily.        Marland Kitchen NEXIUM 20 MG capsule take 1 capsule by mouth once daily  30 capsule  11  . spironolactone (ALDACTONE) 25 MG tablet take 1 tablet by mouth once daily  30 tablet  6  . warfarin (COUMADIN) 2.5 MG tablet Take 0-2.5 mg by mouth See admin instructions. Patient takes 7.5mg  on Sun, Tues, Thurs, Sat and 5mg  on Mon, Wed, Fri.      . warfarin (COUMADIN) 5 MG tablet Take 5 mg by mouth See admin instructions. Patient takes 7.5mg  on Sun, Tues, Thurs, Sat and 5mg  on Mon, Wed, Fri.      . warfarin (COUMADIN) 5 MG tablet  TAKE AS DIRECTED BY COUMADIN CLINIC  45 tablet  3    Exam:  BP 180/110  Pulse 76  Temp 98.1 F (36.7 C) (Oral)  Ht 5' 6.5" (1.689 m)  Wt 230 lb 8 oz (104.554 kg)  BMI 36.65 kg/m2 Gen: Well NAD HEENT: EOMI,  MMM.  PERRL.  Fundoscopy WNL with normal cup to disc ratio Lungs: CTABL Nl WOB Heart: Irr/irr Abd: NABS, NT, ND Exts: Trace edema BL ankles.   No results found for this or any previous visit (from the past 72 hour(s)).

## 2011-12-20 ENCOUNTER — Other Ambulatory Visit: Payer: PRIVATE HEALTH INSURANCE

## 2011-12-23 ENCOUNTER — Ambulatory Visit (INDEPENDENT_AMBULATORY_CARE_PROVIDER_SITE_OTHER): Payer: PRIVATE HEALTH INSURANCE | Admitting: Family Medicine

## 2011-12-23 ENCOUNTER — Ambulatory Visit (INDEPENDENT_AMBULATORY_CARE_PROVIDER_SITE_OTHER): Payer: PRIVATE HEALTH INSURANCE | Admitting: *Deleted

## 2011-12-23 VITALS — BP 160/100 | HR 76 | Temp 97.8°F | Wt 233.7 lb

## 2011-12-23 DIAGNOSIS — I5032 Chronic diastolic (congestive) heart failure: Secondary | ICD-10-CM

## 2011-12-23 DIAGNOSIS — I4891 Unspecified atrial fibrillation: Secondary | ICD-10-CM

## 2011-12-23 DIAGNOSIS — I10 Essential (primary) hypertension: Secondary | ICD-10-CM

## 2011-12-23 MED ORDER — ISOSORB DINITRATE-HYDRALAZINE 20-37.5 MG PO TABS: 1.0000 | ORAL_TABLET | Freq: Three times a day (TID) | ORAL | Status: DC

## 2011-12-23 NOTE — Patient Instructions (Signed)
It was good to meet you today! I have sent in a new medication for you for blood pressure.  It is called Bidil and is a medicine you take with every meal. You need to increase your lasix from once per day to 2 times per day and come back to see Dr. Sondra Come next week.

## 2011-12-23 NOTE — Progress Notes (Signed)
Patient ID: Bianca [name], female   DOB: [birth year], 78 y.o.   MRN: [id number] Subjective: The patient is a 78 y.o. year old female who presents today for hypertension.  Patient was experiencing headaches last week and has had problems with hypertension ever since taken off of amlodipine due to leg swelling.  Patient decided to change 5 days ago and went back on amlodipine/benazepril she was on previously.  Since changing, she has not had headaches.  She denies any sob/doe or chest pain.  Leg swelling has gotten a little bit worse since the medication change.  Patient reports that previously she had a bad experience with metoprolol and her heart rate going too low so she doesn't really want to take beta blockers.  Patient also reports that she is not having increased shortness of breath.  Review of the patient's weight shows a steady gain over the last 3 months.  Patient's past medical, social, and family history were reviewed and updated as appropriate. History  Substance Use Topics  . Smoking status: Former Games developer  . Smokeless tobacco: Not on file  . Alcohol Use: No   Objective:  Filed Vitals:   12/23/11 1016  BP: 160/100  Pulse: 76  Temp: 97.8 F (36.6 C)   Gen: NAD, obese CV: Irregularly irregular, no murmur was appreciated Resp: CTABL, no crackles Ext: 2+ pitting edema to mid calf bilaterally  Assessment/Plan:  Please also see individual problems in problem list for problem-specific plans.

## 2011-12-23 NOTE — Assessment & Plan Note (Signed)
Patient appears to be in rate-controlled a-fib.  As patient has been changing her medications around and is not able to answer questions about her medications with as much assurance as I would like, we will check an INR today.  She has a CHADS-2 score of 3 and reports no falls.

## 2011-12-23 NOTE — Assessment & Plan Note (Signed)
Patient with poor insight into health conditions with persistently elevated blood pressure.  Patient is not amenable to beta blocker treatment due to her view on side effects.  She may benefit from thiazide therapy although I do not know her well enough to know if the close follow up that is necessary for someone on aldactone, lasix and HCTZ is realistic.  I will defer this discussion to her primary.  We will start Bidil today, both for blood pressure and due to it's indication in heart failure.

## 2011-12-23 NOTE — Assessment & Plan Note (Signed)
Pt appears to be gradually gaining weight and has some edema on exam.  While she does not appear to have bad heart failure, I am worried about fluid retention.  Increase lasix to BID and f/u in 1 week.  Will need BMET at that time.  I am not certain that aldactone is adding much benefit in this patient and it does complicate her electrolyte picture.  Discontinuation is something that could be considered.

## 2011-12-24 NOTE — Progress Notes (Signed)
Patient came in for BP check . BP checked manually using regular adult cuff.  BP LA 160/96 and RA 160/100 pulse 76.   Patient reports she stopped benazepril and coreg on 12/05 because of headache and she felt like medication was not controlling BP. She started back on benazepril / amlodipine  which had been stopped previously due to swelling. Since BP is elevated today and patient has been trying to adjust meds on her own an appointment was scheduled for her to meet with doctor today to get back on track .

## 2011-12-26 ENCOUNTER — Telehealth: Payer: Self-pay | Admitting: Family Medicine

## 2011-12-26 NOTE — Telephone Encounter (Signed)
Pt was given bidil 3x daily and it's making her sick.  Wants to talk to nurse about what to do.

## 2011-12-26 NOTE — Telephone Encounter (Signed)
Patient started Bidel on 12/10. After starting she felt like her whole body was shutting down. Has headache, nausea, sees a vein on her forehead jumping. She can't describe how she feels just feels not as usual. Has not taken today.  Had similar difficulties with medication she was taking earlier this week and also stopped that.  Will send message to Dr. Tye Savoy  to advise.

## 2011-12-26 NOTE — Telephone Encounter (Signed)
Please advise patient to STOP taking Bidil for now.  Continue all other blood pressure medications.  Patient should come in for a nurse visit to have blood pressure checked before the holidays.  If BP elevated, she will need an appointment with me.  Thanks, Larita Fife.

## 2011-12-26 NOTE — Telephone Encounter (Addendum)
Patient notified. She has appointment with Dr. Tye Savoy 12/16 already scheduled.

## 2011-12-30 ENCOUNTER — Ambulatory Visit (INDEPENDENT_AMBULATORY_CARE_PROVIDER_SITE_OTHER): Payer: PRIVATE HEALTH INSURANCE | Admitting: Family Medicine

## 2011-12-30 VITALS — BP 138/87 | HR 83 | Ht 66.5 in | Wt 229.0 lb

## 2011-12-30 DIAGNOSIS — I4891 Unspecified atrial fibrillation: Secondary | ICD-10-CM

## 2011-12-30 DIAGNOSIS — I10 Essential (primary) hypertension: Secondary | ICD-10-CM

## 2011-12-30 NOTE — Assessment & Plan Note (Signed)
Rate controlled today - not on BB due to adverse side effects (nausea and lightheadedness).  CHADS score 3.  On coumadin - last INR 3.0.  Followed at [facility].

## 2011-12-30 NOTE — Progress Notes (Signed)
  Subjective:    Patient ID: Bianca [name], female    DOB: 06/28/1930, 77 y.o.   MRN: [id number]  HPI  Patient presents to clinic for follow up BP after stopping Bidil.  Bidil was started 2-3 weeks ago for persistently elevated BP and hx CHF.  Patient says Bidil made her "feel sick all over" and she developed HA and nausea.  Patient stopped taking this medication and restarted previous BP medications: Amlodipine-Benazepril.  She also continues to take Spironolactone (which was prescribed per Cardiology) and Lasix 40 mg daily.  Patient does not tolerated BB - she has tried Metoprolol and Coreg in the past and she says they make her sick and drop HR too low.  She is not interested in starting low dose of Coreg today.  I discussed the importance of BB for both Atrial Fib and CHF, but patient says she will probably not take them.  Last Echo was in 2010 - EF 55%.    Patient denies any SOB, CP, pedal edema, or heart palpitations.  She denies any nausea, HA since discontinuing Bidil.   Review of Systems  Per HPI    Objective:   Physical Exam  Constitutional: No distress.  Cardiovascular: Normal rate, regular rhythm and normal heart sounds.   No murmur heard. Pulmonary/Chest: Effort normal.  Musculoskeletal: Normal range of motion. She exhibits no edema and no tenderness.  Psychiatric: She exhibits a depressed mood.       Assessment & Plan:

## 2011-12-30 NOTE — Patient Instructions (Addendum)
I will not restart a beta blocker at this time because it makes you feel sick. Please call your cardiologist's nurse and ask for for their opinion regarding starting a beta blocker. Your blood pressure looks great today.  Keep up the good work. Schedule follow up appointment with me in 2 months. Follow up with your cardiologist in 5 months. If you develop heart palpitations, chest pain, worsening leg swelling, or shortness of breath, please return to clinic.  DASH Diet The DASH diet stands for "Dietary Approaches to Stop Hypertension." It is a healthy eating plan that has been shown to reduce high blood pressure (hypertension) in as little as 14 days, while also possibly providing other significant health benefits. These other health benefits include reducing the risk of breast cancer after menopause and reducing the risk of type 2 diabetes, heart disease, colon cancer, and stroke. Health benefits also include weight loss and slowing kidney failure in patients with chronic kidney disease.  DIET GUIDELINES  Limit salt (sodium). Your diet should contain less than 1500 mg of sodium daily.  Limit refined or processed carbohydrates. Your diet should include mostly whole grains. Desserts and added sugars should be used sparingly.  Include small amounts of heart-healthy fats. These types of fats include nuts, oils, and tub margarine. Limit saturated and trans fats. These fats have been shown to be harmful in the body. CHOOSING FOODS  The following food groups are based on a 2000 calorie diet. See your Registered Dietitian for individual calorie needs. Grains and Grain Products (6 to 8 servings daily)  Eat More Often: Whole-wheat bread, brown rice, whole-grain or wheat pasta, quinoa, popcorn without added fat or salt (air popped).  Eat Less Often: White bread, white pasta, white rice, cornbread. Vegetables (4 to 5 servings daily)  Eat More Often: Fresh, frozen, and canned vegetables. Vegetables may  be raw, steamed, roasted, or grilled with a minimal amount of fat.  Eat Less Often/Avoid: Creamed or fried vegetables. Vegetables in a cheese sauce. Fruit (4 to 5 servings daily)  Eat More Often: All fresh, canned (in natural juice), or frozen fruits. Dried fruits without added sugar. One hundred percent fruit juice ( cup [237 mL] daily).  Eat Less Often: Dried fruits with added sugar. Canned fruit in light or heavy syrup. [employer], Fish, and Poultry (2 servings or less daily. One serving is 3 to 4 oz [85-114 g]).  Eat More Often: Ninety percent or leaner ground beef, tenderloin, sirloin. Round cuts of beef, chicken breast, [place] breast. All fish. Grill, bake, or broil your meat. Nothing should be fried.  Eat Less Often/Avoid: Fatty cuts of meat, [place], or chicken leg, thigh, or wing. Fried cuts of meat or fish. Dairy (2 to 3 servings)  Eat More Often: Low-fat or fat-free milk, low-fat plain or light yogurt, reduced-fat or part-skim cheese.  Eat Less Often/Avoid: Milk (whole, 2%).Whole milk yogurt. Full-fat cheeses. Nuts, Seeds, and Legumes (4 to 5 servings per week)  Eat More Often: All without added salt.  Eat Less Often/Avoid: Salted nuts and seeds, canned beans with added salt. Fats and Sweets (limited)  Eat More Often: Vegetable oils, tub margarines without trans fats, sugar-free gelatin. Mayonnaise and salad dressings.  Eat Less Often/Avoid: Coconut oils, palm oils, butter, stick margarine, cream, half and half, cookies, candy, pie. FOR MORE INFORMATION The Dash Diet Eating Plan: www.dashdiet.org Document Released: 12/20/2010 Document Revised: 03/25/2011 Document Reviewed: 12/20/2010 [facility] Patient Information 2013 [place], [place].

## 2012-01-02 ENCOUNTER — Encounter: Payer: Self-pay | Admitting: Family Medicine

## 2012-01-02 NOTE — Progress Notes (Signed)
Patient ID: Bianca [name], female   DOB: January 01, 1931, 78 y.o.   MRN: [id number]  Patient's daughter called.  Patient complains of light pink in urine this AM x 1 occurrence. Last INR was 3.0 12/23/11. Denies any dysuria, burning urination. Denies any nausea/vomiting, fever, or abdominal pain. Advised patient to schedule appointment if pink urine persists or if she develops symptoms. Red flags reviewed.  If light pink urine resolves, no need to come to clinic. Daughter agreed with plan.

## 2012-01-13 ENCOUNTER — Other Ambulatory Visit: Payer: Self-pay | Admitting: Family Medicine

## 2012-03-11 ENCOUNTER — Ambulatory Visit (INDEPENDENT_AMBULATORY_CARE_PROVIDER_SITE_OTHER): Payer: PRIVATE HEALTH INSURANCE | Admitting: *Deleted

## 2012-03-11 DIAGNOSIS — Z7901 Long term (current) use of anticoagulants: Secondary | ICD-10-CM

## 2012-03-11 DIAGNOSIS — I4891 Unspecified atrial fibrillation: Secondary | ICD-10-CM

## 2012-03-11 LAB — POCT INR: INR: 2.8

## 2012-03-19 ENCOUNTER — Other Ambulatory Visit: Payer: Self-pay

## 2012-03-19 MED ORDER — WARFARIN SODIUM 5 MG PO TABS: ORAL_TABLET | ORAL | Status: DC

## 2012-04-13 ENCOUNTER — Other Ambulatory Visit: Payer: Self-pay | Admitting: Family Medicine

## 2012-04-13 DIAGNOSIS — Z1231 Encounter for screening mammogram for malignant neoplasm of breast: Secondary | ICD-10-CM

## 2012-04-28 ENCOUNTER — Ambulatory Visit (INDEPENDENT_AMBULATORY_CARE_PROVIDER_SITE_OTHER): Payer: PRIVATE HEALTH INSURANCE | Admitting: *Deleted

## 2012-04-28 DIAGNOSIS — Z7901 Long term (current) use of anticoagulants: Secondary | ICD-10-CM

## 2012-04-28 DIAGNOSIS — I4891 Unspecified atrial fibrillation: Secondary | ICD-10-CM

## 2012-04-28 LAB — POCT INR: INR: 2.6

## 2012-04-29 ENCOUNTER — Ambulatory Visit (HOSPITAL_COMMUNITY): Payer: PRIVATE HEALTH INSURANCE

## 2012-04-30 ENCOUNTER — Encounter: Payer: Self-pay | Admitting: Family Medicine

## 2012-04-30 ENCOUNTER — Ambulatory Visit (INDEPENDENT_AMBULATORY_CARE_PROVIDER_SITE_OTHER): Payer: PRIVATE HEALTH INSURANCE | Admitting: Family Medicine

## 2012-04-30 VITALS — BP 121/79 | HR 97 | Ht 66.0 in | Wt 226.0 lb

## 2012-04-30 DIAGNOSIS — M25562 Pain in left knee: Secondary | ICD-10-CM | POA: Insufficient documentation

## 2012-04-30 DIAGNOSIS — M79605 Pain in left leg: Secondary | ICD-10-CM

## 2012-04-30 DIAGNOSIS — M25561 Pain in right knee: Secondary | ICD-10-CM | POA: Insufficient documentation

## 2012-04-30 DIAGNOSIS — M79609 Pain in unspecified limb: Secondary | ICD-10-CM

## 2012-04-30 NOTE — Patient Instructions (Addendum)
Knee Exercises  EXERCISES  RANGE OF MOTION(ROM) AND STRETCHING EXERCISES  These exercises may help you when beginning to rehabilitate your injury. Your symptoms may resolve with or without further involvement from your physician, physical therapist or athletic trainer. While completing these exercises, remember:   · Restoring tissue flexibility helps normal motion to return to the joints. This allows healthier, less painful movement and activity.  · An effective stretch should be held for at least 30 seconds.  · A stretch should never be painful. You should only feel a gentle lengthening or release in the stretched tissue.  STRETCH - Knee Extension, Prone  · Lie on your stomach on a firm surface, such as a bed or countertop. Place your right / left knee and leg just beyond the edge of the surface. You may wish to place a towel under the far end of your right / left thigh for comfort.  · Relax your leg muscles and allow gravity to straighten your knee. Your clinician may advise you to add an ankle weight if more resistance is helpful for you.  · You should feel a stretch in the back of your right / left knee. Hold this position for __________ seconds.  Repeat __________ times. Complete this stretch __________ times per day.  * Your physician, physical therapist or athletic trainer may ask you to add ankle weight to enhance your stretch.   RANGE OF MOTION - Knee Flexion, Active  · Lie on your back with both knees straight. (If this causes back discomfort, bend your opposite knee, placing your foot flat on the floor.)  · Slowly slide your heel back toward your buttocks until you feel a gentle stretch in the front of your knee or thigh.  · Hold for __________ seconds. Slowly slide your heel back to the starting position.  Repeat __________ times. Complete this exercise __________ times per day.   STRETCH - Quadriceps, Prone   · Lie on your stomach on a firm surface, such as a bed or padded floor.  · Bend your right /  left knee and grasp your ankle. If you are unable to reach, your ankle or pant leg, use a belt around your foot to lengthen your reach.  · Gently pull your heel toward your buttocks. Your knee should not slide out to the side. You should feel a stretch in the front of your thigh and/or knee.  · Hold this position for __________ seconds.  Repeat __________ times. Complete this stretch __________ times per day.   STRETCH - Hamstrings, Supine   · Lie on your back. Loop a belt or towel over the ball of your right / left foot.  · Straighten your right / left knee and slowly pull on the belt to raise your leg. Do not allow the right / left knee to bend. Keep your opposite leg flat on the floor.  · Raise the leg until you feel a gentle stretch behind your right / left knee or thigh. Hold this position for __________ seconds.  Repeat __________ times. Complete this stretch __________ times per day.   STRENGTHENING EXERCISES  These exercises may help you when beginning to rehabilitate your injury. They may resolve your symptoms with or without further involvement from your physician, physical therapist or athletic trainer. While completing these exercises, remember:   · Muscles can gain both the endurance and the strength needed for everyday activities through controlled exercises.  · Complete these exercises as instructed by your physician,   physical therapist or athletic trainer. Progress the resistance and repetitions only as guided.  · You may experience muscle soreness or fatigue, but the pain or discomfort you are trying to eliminate should never worsen during these exercises. If this pain does worsen, stop and make certain you are following the directions exactly. If the pain is still present after adjustments, discontinue the exercise until you can discuss the trouble with your clinician.  STRENGTH - Quadriceps, Isometrics  · Lie on your back with your right / left leg extended and your opposite knee  bent.  · Gradually tense the muscles in the front of your right / left thigh. You should see either your knee cap slide up toward your hip or increased dimpling just above the knee. This motion will push the back of the knee down toward the floor/mat/bed on which you are lying.  · Hold the muscle as tight as you can without increasing your pain for __________ seconds.  · Relax the muscles slowly and completely in between each repetition.  Repeat __________ times. Complete this exercise __________ times per day.   STRENGTH - Quadriceps, Short Arcs   · Lie on your back. Place a __________ inch towel roll under your knee so that the knee slightly bends.  · Raise only your lower leg by tightening the muscles in the front of your thigh. Do not allow your thigh to rise.  · Hold this position for __________ seconds.  Repeat __________ times. Complete this exercise __________ times per day.   OPTIONAL ANKLE WEIGHTS: Begin with ____________________, but DO NOT exceed ____________________. Increase in 1 pound/0.5 kilogram increments.   STRENGTH - Quadriceps, Straight Leg Raises   Quality counts! Watch for signs that the quadriceps muscle is working to insure you are strengthening the correct muscles and not "cheating" by substituting with healthier muscles.  · Lay on your back with your right / left leg extended and your opposite knee bent.  · Tense the muscles in the front of your right / left thigh. You should see either your knee cap slide up or increased dimpling just above the knee. Your thigh may even quiver.  · Tighten these muscles even more and raise your leg 4 to 6 inches off the floor. Hold for __________ seconds.  · Keeping these muscles tense, lower your leg.  · Relax the muscles slowly and completely in between each repetition.  Repeat __________ times. Complete this exercise __________ times per day.   STRENGTH - Hamstring, Curls  · Lay on your stomach with your legs extended. (If you lay on a bed, your feet  may hang over the edge.)  · Tighten the muscles in the back of your thigh to bend your right / left knee up to 90 degrees. Keep your hips flat on the bed/floor.  · Hold this position for __________ seconds.  · Slowly lower your leg back to the starting position.  Repeat __________ times. Complete this exercise __________ times per day.   OPTIONAL ANKLE WEIGHTS: Begin with ____________________, but DO NOT exceed ____________________. Increase in 1 pound/0.5 kilogram increments.   STRENGTH - Quadriceps, Squats  · Stand in a door frame so that your feet and knees are in line with the frame.  · Use your hands for balance, not support, on the frame.  · Slowly lower your weight, bending at the hips and knees. Keep your lower legs upright so that they are parallel with the door frame. Squat only within the range   that does not increase your knee pain. Never let your hips drop below your knees.  · Slowly return upright, pushing with your legs, not pulling with your hands.  Repeat __________ times. Complete this exercise __________ times per day.   STRENGTH - Quadriceps, Wall Slides   Follow guidelines for form closely. Increased knee pain often results from poorly placed feet or knees.  · Lean against a smooth wall or door and walk your feet out 18-24 inches. Place your feet hip-width apart.  · Slowly slide down the wall or door until your knees bend __________ degrees.* Keep your knees over your heels, not your toes, and in line with your hips, not falling to either side.  · Hold for __________ seconds. Stand up to rest for __________ seconds in between each repetition.  Repeat __________ times. Complete this exercise __________ times per day.  * Your physician, physical therapist or athletic trainer will alter this angle based on your symptoms and progress.  Document Released: 11/14/2004 Document Revised: 03/25/2011 Document Reviewed: 04/14/2008  [employer]® Patient Information ©2013 [employer].

## 2012-04-30 NOTE — Assessment & Plan Note (Signed)
  Leg pain: May be due to over use vs varicose vein pressure   Tylenol recommended prn pain. Home stretching exercise instruction was given. 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 RTC in 2wks,if symptom persist to start PT. She agreed with plan.

## 2012-04-30 NOTE — Progress Notes (Signed)
Subjective:     Patient ID: Bianca [name], female   DOB: 1930-03-15, 79 y.o.   MRN: [id number]  Leg Pain   Left thigh pain for 1 wk,took tramadol last week which helped but it made her feel sick. Initially pain was about 9/10 in severity. Feels better today,whenever she sits for a while feel stiff at the back of her left thigh,pain is pressure like and it radiates all the way up to her hip,she had similar symptoms1 yr ago with pain on right. 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  Past Medical History  Diagnosis Date  . Hypertension   . Atrial fibrillation   . Osteoarthritis   . GERD (gastroesophageal reflux disease)   . Long term current use of anticoagulant   . Chronic diastolic heart failure     Echo 02/2008: EF 55%, MAC, mild MR, moderate BAE  . Obesity   . Refusal of blood transfusions as patient is Jehovah's Witness       Review of Systems  Respiratory: Negative.   Cardiovascular: Negative.   Gastrointestinal: Negative.   Musculoskeletal: Negative for joint swelling.       Leg pain  All other systems reviewed and are negative.    Filed Vitals:   04/30/12 1338  BP: 121/79  Pulse: 97  Height: 5\' 6"  (1.676 m)  Weight: 226 lb (102.513 kg)      Objective:   Physical Exam  Nursing note and vitals reviewed. Constitutional: She is oriented to person, place, and time. She appears well-developed. No distress.  Cardiovascular: Normal rate, normal heart sounds and intact distal pulses.  An irregular rhythm present.  No murmur heard. Pulmonary/Chest: Effort normal and breath sounds normal. No respiratory distress. She has no wheezes.  Abdominal: Soft. She exhibits no distension.  Musculoskeletal: Normal range of motion. She exhibits no edema and no tenderness.       Left upper leg: She exhibits no tenderness and no swelling.       Legs: Neurological: She is alert and oriented to person, place, and  time.       Assessment:     Leg pain: May be due to over use vs varicose vein pressure     Plan:     Tylenol recommended prn pain. Home stretching exercise instruction was given. 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 RTC in 2wks,if symptom persist to start PT. She agreed with plan.

## 2012-05-08 ENCOUNTER — Ambulatory Visit
Admission: RE | Admit: 2012-05-08 | Discharge: 2012-05-08 | Disposition: A | Discharge status: Home / Self Care | Payer: PRIVATE HEALTH INSURANCE | Source: Ambulatory Visit | Attending: Family Medicine | Admitting: Family Medicine

## 2012-05-08 DIAGNOSIS — Z1231 Encounter for screening mammogram for malignant neoplasm of breast: Secondary | ICD-10-CM

## 2012-05-11 ENCOUNTER — Encounter: Payer: Self-pay | Admitting: Home Health Services

## 2012-05-11 DIAGNOSIS — Z9181 History of falling: Secondary | ICD-10-CM | POA: Insufficient documentation

## 2012-05-30 ENCOUNTER — Other Ambulatory Visit: Payer: Self-pay | Admitting: Family Medicine

## 2012-06-01 MED ORDER — ESOMEPRAZOLE MAGNESIUM 20 MG PO CPDR: DELAYED_RELEASE_CAPSULE | ORAL | Status: DC

## 2012-06-10 ENCOUNTER — Ambulatory Visit (INDEPENDENT_AMBULATORY_CARE_PROVIDER_SITE_OTHER): Payer: PRIVATE HEALTH INSURANCE | Admitting: *Deleted

## 2012-06-10 ENCOUNTER — Encounter: Payer: Self-pay | Admitting: Cardiology

## 2012-06-10 ENCOUNTER — Ambulatory Visit (INDEPENDENT_AMBULATORY_CARE_PROVIDER_SITE_OTHER): Payer: Medicaid Other | Admitting: Cardiology

## 2012-06-10 VITALS — BP 124/80 | HR 74 | Ht 66.0 in | Wt 235.8 lb

## 2012-06-10 DIAGNOSIS — R609 Edema, unspecified: Secondary | ICD-10-CM

## 2012-06-10 DIAGNOSIS — I4891 Unspecified atrial fibrillation: Secondary | ICD-10-CM

## 2012-06-10 DIAGNOSIS — Z7901 Long term (current) use of anticoagulants: Secondary | ICD-10-CM

## 2012-06-10 DIAGNOSIS — R600 Localized edema: Secondary | ICD-10-CM

## 2012-06-10 DIAGNOSIS — I5032 Chronic diastolic (congestive) heart failure: Secondary | ICD-10-CM

## 2012-06-10 MED ORDER — FUROSEMIDE 40 MG PO TABS: 60.0000 mg | ORAL_TABLET | Freq: Every day | ORAL | Status: DC

## 2012-06-10 NOTE — Assessment & Plan Note (Signed)
This is worse. It seems to bother her it in the day and a sore. We'll increase her furosemide to 60 mg by mouth every morning with followup chemistry profile in 2 weeks. No other changes.

## 2012-06-10 NOTE — Assessment & Plan Note (Signed)
Stable except for slight edema at the end of the day. No change in Lasix.

## 2012-06-10 NOTE — Assessment & Plan Note (Signed)
Stable. Asymptomatic. Continue anticoagulation. Please note that she is a Jehovah witness.

## 2012-06-10 NOTE — Progress Notes (Signed)
HPI This is Dr. returns today for evaluation and management of her history of chronic diastolic heart failure, history of atrial fibrillation, hypertension, and anticoagulation.  She's been having increased edema since the weather has gotten warm. She denies orthopnea but is short of breath with activity. She is overweight.  She is compliant with her meds. She has no other complaints today.  Past Medical History  Diagnosis Date  . Hypertension   . Atrial fibrillation   . Osteoarthritis   . GERD (gastroesophageal reflux disease)   . Long term current use of anticoagulant   . Chronic diastolic heart failure     Echo 02/2008: EF 55%, MAC, mild MR, moderate BAE  . Obesity   . Refusal of blood transfusions as patient is Jehovah's Witness     Current Outpatient Prescriptions  Medication Sig Dispense Refill  . amLODipine-benazepril (LOTREL) 10-20 MG per capsule Take 1 capsule by mouth daily.  30 capsule  11  . calcium-vitamin D (OSCAL WITH D 500-200) 500-200 MG-UNIT per tablet Take 1 tablet by mouth daily.        . cholecalciferol (VITAMIN D) 1000 UNITS tablet Take 1,000 Units by mouth daily.      . clotrimazole-betamethasone (LOTRISONE) cream APPLY A GENEROUS AMOUNT TO AFFECTED AREA TWICE DAILY  90 g  2  . esomeprazole (NEXIUM) 20 MG capsule take 1 capsule by mouth once daily  30 capsule  2  . fish oil-omega-3 fatty acids 1000 MG capsule Take 2 g by mouth daily.      . furosemide (LASIX) 40 MG tablet Take 40 mg by mouth daily.       . Multiple Vitamin (MULTIVITAMIN) tablet Take 1 tablet by mouth daily.        Marland Kitchen spironolactone (ALDACTONE) 25 MG tablet take 1 tablet by mouth once daily  30 tablet  6  . warfarin (COUMADIN) 2.5 MG tablet Take 0-2.5 mg by mouth See admin instructions. Patient takes 7.5mg  on Sun, Tues, Thurs, Sat and 5mg  on Mon, Wed, Fri.      . warfarin (COUMADIN) 5 MG tablet Take as directed by anticoagulation clinic  45 tablet  3   No current facility-administered medications  for this visit.    Allergies  Allergen Reactions  . Hydrocodone Nausea And Vomiting  . Morphine     REACTION: itching  . Penicillins     REACTION: itching    Family History  Problem Relation Age of Onset  . Depression Daughter   . Asthma Mother   . Colon cancer Mother   . Diabetes      sibling  . Hypertension      sibling    History   Social History  . Marital Status: Widowed    Spouse Name: N/A    Number of Children: N/A  . Years of Education: N/A   Occupational History  . Not on file.   Social History Main Topics  . Smoking status: Former Games developer  . Smokeless tobacco: Not on file  . Alcohol Use: No  . Drug Use: No  . Sexually Active: Not on file   Other Topics Concern  . Not on file   Social History Narrative   Daughter with mental health issues, won't take her meds regularly.  Currently in the hospital for dehydration, may be transferring to Behavioral [facility] (06/05/10).    ROS ALL NEGATIVE EXCEPT THOSE NOTED IN HPI  PE  General Appearance: well developed, well nourished in no acute distress, obese HEENT: symmetrical  face, PERRLA,  Neck: no JVD, thyromegaly, or adenopathy, trachea midline Chest: symmetric without deformity Cardiac: PMI POORLY APPRECIATED,I RRR, normal S1, S2, no gallop or murmur Lung: clear to ausculation and percussion Vascular: all pulses full without bruits  Abdominal: nondistended, nontender, good bowel sounds, no HSM, no bruits Extremities: no cyanosis, clubbing, 2+ pretibial edema, no sign of DVT, no varicosities  Skin: normal color, no rashes Neuro: alert and oriented x 3, non-focal Pysch: normal affect  EKG  BMET    Component Value Date/Time   NA 140 12/09/2011 1035   K 4.5 12/09/2011 1035   CL 103 12/09/2011 1035   CO2 30 12/09/2011 1035   GLUCOSE 108* 12/09/2011 1035   BUN 14 12/09/2011 1035   CREATININE 0.58 12/09/2011 1035   CREATININE 0.6 06/14/2011 0945   CALCIUM 9.5 12/09/2011 1035   GFRNONAA >60 12/27/2008  0655   GFRAA  Value: >60        The eGFR has been calculated using the MDRD equation. This calculation has not been validated in all clinical situations. eGFR's persistently <60 mL/min signify possible Chronic Kidney Disease. 12/27/2008 0655    Lipid Panel     Component Value Date/Time   CHOL 158 07/16/2011 1014   TRIG 107 07/16/2011 1014   HDL 40 07/16/2011 1014   CHOLHDL 4.0 07/16/2011 1014   VLDL 21 07/16/2011 1014   LDLCALC 97 07/16/2011 1014    CBC    Component Value Date/Time   WBC 6.7 06/05/2010 1133   WBC 6.7 03/03/2008 1112   RBC 4.61 06/05/2010 1133   RBC 4.87 03/03/2008 1112   HGB 12.9 06/05/2010 1133   HGB 13.7 03/03/2008 1112   HCT 40.3 06/05/2010 1133   HCT 42.0 03/03/2008 1112   PLT 219 06/05/2010 1133   PLT 189 03/03/2008 1112   MCV 87.4 06/05/2010 1133   MCV 86.2 03/03/2008 1112   MCH 28.0 06/05/2010 1133   MCH 28.1 03/03/2008 1112   MCHC 32.0 06/05/2010 1133   MCHC 32.6 03/03/2008 1112   RDW 16.0* 06/05/2010 1133   RDW 15.8* 03/03/2008 1112   LYMPHSABS 3.0 12/25/2008 0535   LYMPHSABS 2.4 03/03/2008 1112   MONOABS 0.7 12/25/2008 0535   MONOABS 0.3 03/03/2008 1112   EOSABS 0.5 12/25/2008 0535   EOSABS 0.1 03/03/2008 1112   BASOSABS 0.0 12/25/2008 0535   BASOSABS 0.0 03/03/2008 1112

## 2012-06-10 NOTE — Patient Instructions (Addendum)
Increase your furosemide ( lasix) to 60mg  a day  You will need lab work in about 2 weeks : BMP  We will call you with your results  Your physician wants you to follow-up in: 1 year with Dr. Shirlee Latch. You will receive a reminder letter in the mail two months in advance. If you don't receive a letter, please call our office to schedule the follow-up appointment.

## 2012-06-25 ENCOUNTER — Other Ambulatory Visit: Payer: PRIVATE HEALTH INSURANCE

## 2012-07-02 ENCOUNTER — Encounter: Payer: Self-pay | Admitting: Family Medicine

## 2012-07-02 ENCOUNTER — Ambulatory Visit (INDEPENDENT_AMBULATORY_CARE_PROVIDER_SITE_OTHER): Payer: PRIVATE HEALTH INSURANCE | Admitting: Family Medicine

## 2012-07-02 VITALS — BP 136/83 | HR 87 | Temp 98.6°F | Ht 66.0 in | Wt 236.0 lb

## 2012-07-02 DIAGNOSIS — R609 Edema, unspecified: Secondary | ICD-10-CM

## 2012-07-02 DIAGNOSIS — R600 Localized edema: Secondary | ICD-10-CM

## 2012-07-02 LAB — COMPREHENSIVE METABOLIC PANEL
ALT: 13 U/L (ref 0–35)
AST: 18 U/L (ref 0–37)
Alkaline Phosphatase: 60 U/L (ref 39–117)
CO2: 30 mEq/L (ref 19–32)
Creat: 0.62 mg/dL (ref 0.50–1.10)
Sodium: 141 mEq/L (ref 135–145)
Total Bilirubin: 0.6 mg/dL (ref 0.3–1.2)
Total Protein: 6.6 g/dL (ref 6.0–8.3)

## 2012-07-02 LAB — TSH: TSH: 0.589 u[IU]/mL (ref 0.350–4.500)

## 2012-07-02 NOTE — Assessment & Plan Note (Signed)
Appears mild and likely related to venous congestion/insufficiency given the improvement overnight. Discussed and given rx for compression stockings. Recommend elevation of legs. Will not increase diuretics at this time. Due for labs cr and albumin to rule out secondary causes of edema. Advised to f/u with PCP in 2-3 weeks.

## 2012-07-02 NOTE — Patient Instructions (Addendum)
You have some weak veins causing swelling.  Will send letter with lab results. Use compression stockings before you get out of bed and wear during the daytime. Keep elevated when you can. Make an appointment with Dr. Tye Savoy in 2-3 weeks.

## 2012-07-02 NOTE — Progress Notes (Signed)
  Subjective:    Patient ID: Bianca [name], female    DOB: 23-Oct-1930, 79 y.o.   MRN: [id number]  HPI  79 yo female with history of afib on coumadin, chronic DCHF presents to same day clinic for leg swelling. This is a chronic problem worsened for past 4-6 weeks. Her cardiologist increased the lasix from 40 mg to 60 mg about 3 weeks ago, but patient hasnt noticed much change. She has bilateral lower leg edema that worsens throughout the day and improves overnight. Notices some slight burning of anterior legs when its at the worst. She has no injuries, redness, wounds, worsened dyspnea, cough, chest pain, falls, syncope, change in urination. She reports having a therapeutic INR recently. She does not have compression stocking.  She also brings a Runner, broadcasting/film/video of papers regarding a nurse visit from insurance company asking about zostavax and tdap being due.  Review of Systems See HPI otherwise negative.  reports that she has quit smoking. She does not have any smokeless tobacco history on file.     Objective:   Physical Exam  Constitutional: She is oriented to person, place, and time. She appears well-developed and well-nourished. No distress.  Cardiovascular: Normal rate.   Irregularly irregular.  Pulmonary/Chest: Effort normal and breath sounds normal. No respiratory distress. She has no wheezes. She has no rales.  Musculoskeletal: She exhibits edema. She exhibits no tenderness.  Mild distal LE/pedal edema slightly worse in L>R. No calf TTP or cords, no erythema, induration or warmth.  Normal gait.   Neurological: She is alert and oriented to person, place, and time.  Skin: She is not diaphoretic.  Psychiatric: She has a normal mood and affect.        Assessment & Plan:

## 2012-07-16 ENCOUNTER — Other Ambulatory Visit: Payer: Self-pay | Admitting: Family Medicine

## 2012-07-16 DIAGNOSIS — I10 Essential (primary) hypertension: Secondary | ICD-10-CM

## 2012-07-22 ENCOUNTER — Ambulatory Visit (INDEPENDENT_AMBULATORY_CARE_PROVIDER_SITE_OTHER): Payer: PRIVATE HEALTH INSURANCE | Admitting: *Deleted

## 2012-07-22 DIAGNOSIS — Z7901 Long term (current) use of anticoagulants: Secondary | ICD-10-CM

## 2012-07-22 DIAGNOSIS — I4891 Unspecified atrial fibrillation: Secondary | ICD-10-CM

## 2012-08-13 ENCOUNTER — Other Ambulatory Visit: Payer: Self-pay | Admitting: Family Medicine

## 2012-08-13 DIAGNOSIS — I10 Essential (primary) hypertension: Secondary | ICD-10-CM

## 2012-09-02 ENCOUNTER — Telehealth: Payer: Self-pay | Admitting: *Deleted

## 2012-09-02 ENCOUNTER — Ambulatory Visit (INDEPENDENT_AMBULATORY_CARE_PROVIDER_SITE_OTHER): Payer: Medicaid Other | Admitting: Family Medicine

## 2012-09-02 ENCOUNTER — Encounter: Payer: Self-pay | Admitting: Family Medicine

## 2012-09-02 ENCOUNTER — Ambulatory Visit (INDEPENDENT_AMBULATORY_CARE_PROVIDER_SITE_OTHER): Payer: PRIVATE HEALTH INSURANCE | Admitting: *Deleted

## 2012-09-02 VITALS — BP 134/84 | HR 62 | Temp 98.1°F | Ht 66.0 in | Wt 236.0 lb

## 2012-09-02 DIAGNOSIS — Z7901 Long term (current) use of anticoagulants: Secondary | ICD-10-CM

## 2012-09-02 DIAGNOSIS — L723 Sebaceous cyst: Secondary | ICD-10-CM

## 2012-09-02 DIAGNOSIS — I4891 Unspecified atrial fibrillation: Secondary | ICD-10-CM

## 2012-09-02 DIAGNOSIS — L089 Local infection of the skin and subcutaneous tissue, unspecified: Secondary | ICD-10-CM

## 2012-09-02 MED ORDER — SULFAMETHOXAZOLE-TMP DS 800-160 MG PO TABS: 1.0000 | ORAL_TABLET | Freq: Two times a day (BID) | ORAL | Status: DC

## 2012-09-02 NOTE — Progress Notes (Signed)
Subjective:     Patient ID: Bianca [name], female   DOB: [birth year], 79 y.o.   MRN: [id number]  HPI Patient was here for lab work when she notified lab of a mass on her thigh. She is now being seen as a same-day appointment for that.  Area on the left thigh has been there for several weeks. Only  3 days before  today's visit gotten increasingly sore, a little bit red. She says it started out looking like a blackhead. PERTINENT  PMH / PSH: Anticoagulation with Coumadin INR today is 2.6.  Review of Systems Pertinent review of systems: negative for fever, sweats, chills. No oozing from area. Mild pain and tenderness left anterior thigh    Objective:   Physical Exam    well-developed overweight female no acute distress THIGH: Left. At the top of the left thigh is a small area of erythema and induration with with of about 3 cm in height of about 1.5 cm. Mild fluctuance in the center. Assessment:     PROCEDURE NOTE: Patient given informed consent for incision and drainage. Area prepped and draped in usual sterile fashion. 2 cc of 1% lidocaine with epinephrine were used for local anesthesia. An 18-gauge needle was used to very moved the rough of the fluctuant area. Thick sebaceous-type material was expressed. There was cleaned with alcohol, small amount of topical antibiotic placed and then a pressure dressing placed on top. Patient tolerated the procedure well. There was about 2 cc of blood loss.     Plan:     Small abscess that like to start as a sebaceous cyst. Incision and drainage today. I'll put her on 3 days of Keflex. I don't think with this sort length of antibiotic will need to recheck or adjust her INR regimen. Patient is given red flags to watch for such as worsening pain, increasing erythema, fever, bleeding. A specific followup for this unless it does not heal well.

## 2012-09-03 ENCOUNTER — Telehealth: Payer: Self-pay | Admitting: *Deleted

## 2012-09-03 NOTE — Telephone Encounter (Signed)
Pt called and states was seen by [facility] yesterday and had I and D of sebaceous cyst left upper thigh and was placed in Bactrim DS 1 bid for 3 days . Pt instructed to take antibiotic as ordered and that needs to have INR checked sooner. Appt made to be seen in clinic on August 25th and she states understanding.

## 2012-09-06 ENCOUNTER — Other Ambulatory Visit: Payer: Self-pay | Admitting: Family Medicine

## 2012-09-06 DIAGNOSIS — K219 Gastro-esophageal reflux disease without esophagitis: Secondary | ICD-10-CM

## 2012-09-07 ENCOUNTER — Ambulatory Visit (INDEPENDENT_AMBULATORY_CARE_PROVIDER_SITE_OTHER): Payer: Medicaid Other | Admitting: *Deleted

## 2012-09-07 DIAGNOSIS — I4891 Unspecified atrial fibrillation: Secondary | ICD-10-CM

## 2012-09-07 DIAGNOSIS — Z7901 Long term (current) use of anticoagulants: Secondary | ICD-10-CM

## 2012-09-07 LAB — POCT INR: INR: 2.5

## 2012-10-06 ENCOUNTER — Ambulatory Visit (INDEPENDENT_AMBULATORY_CARE_PROVIDER_SITE_OTHER): Payer: PRIVATE HEALTH INSURANCE | Admitting: Family Medicine

## 2012-10-06 ENCOUNTER — Encounter: Payer: Self-pay | Admitting: Family Medicine

## 2012-10-06 VITALS — BP 131/84 | HR 90 | Temp 97.7°F | Ht 66.0 in | Wt 238.0 lb

## 2012-10-06 DIAGNOSIS — Z23 Encounter for immunization: Secondary | ICD-10-CM

## 2012-10-06 DIAGNOSIS — R609 Edema, unspecified: Secondary | ICD-10-CM

## 2012-10-06 DIAGNOSIS — R600 Localized edema: Secondary | ICD-10-CM

## 2012-10-06 DIAGNOSIS — M1990 Unspecified osteoarthritis, unspecified site: Secondary | ICD-10-CM | POA: Insufficient documentation

## 2012-10-06 DIAGNOSIS — M129 Arthropathy, unspecified: Secondary | ICD-10-CM

## 2012-10-06 MED ORDER — ACETAMINOPHEN ER 650 MG PO TBCR: 650.0000 mg | EXTENDED_RELEASE_TABLET | Freq: Three times a day (TID) | ORAL | Status: DC | PRN

## 2012-10-06 NOTE — Patient Instructions (Addendum)
Thank you for coming in,   Please take an extra dose of furosemide one day a week. All other days take your regularly scheduled furosemide.   Please visit [email or web address]. Order a pair of the microfiber compresssion in 15-20 moderate compression. Or call the phone number is [phone], please ask for Sales.   Please don't use NSAIDS such as ibuprofen for your arthritis. I ordered Tylenol (acetomenophen) 650 mg every 8 hours as needed for your arthritis pain.    Please feel free to call with any questions or concerns at any time, at [phone]. --Dr. Jordan Likes

## 2012-10-06 NOTE — Assessment & Plan Note (Signed)
+  1 Pitting edema bilateral lower extremities. Gained two pounds since last visit.  - take an extra dose of furosemide once a day per week. All other days take normal dose  - has been prescribed compression hose in the past but not able to get them on herself  - gave pt [email or web address] web site and phone number. Order a pair of women's compression stocking in 15-20 mmHg strength - do not feel as though the swelling is attributed to her amlodipine/benzapril - recommended elevation of legs and wearing the compression stockings as much as possible.

## 2012-10-06 NOTE — Progress Notes (Signed)
Subjective:     Patient ID: Bianca [name], female   DOB: 1930/01/24, 79 y.o.   MRN: [id number]  HPI  1. Swelling of legs: She is a history chronic diastolic heart failure with swelling in her bilateral lower extremities for whish she has been seen previously. During her last visit there were no changes to the diuretics but did order compression hoses. She is not able to wear the compression hoses because she cannot get them on herself.  She states the swelling usually worse at the end of the day but hasn't been getting better overnight.  She does feel like she is getting out of breath easier but reports no chest pain, cough, or fall.  She doesn't elevate her legs and doesn't follow a specific low-salt diet.  2. Arthritis: She complains of having pain all over. It's worse upon waking up in the morning. She feels stiff in her back and in her hands. She has taken ibuprofen in the past with little help but Tylenol doesn't offer any help. She has tried tramadol in the past but she does not like using it.  Health Maintenance: flu vaccine recommended. She is asking about the shingles vaccine.   Review of Systems All other systems reviewed and otherwise normal    Objective:   Physical Exam Gen: NAD, alert, cooperative with exam CV: irregular irregular, normal rate  Resp: CTABL, no wheezes, non-labored, speaking in full sentences with no shortness of breath appreciated  Abd: SNTND, BS present, no guarding or organomegaly Ext: +1 pitting edema in bilateral lower extremities, No calf TTP or cords, no erythema, induration or warmth. Normal gait.      Assessment:         Plan:     Health maintenance: She received the flu vaccine and was provided information to obtain the shingles vaccine from her pharmacy.

## 2012-10-19 ENCOUNTER — Ambulatory Visit (INDEPENDENT_AMBULATORY_CARE_PROVIDER_SITE_OTHER): Payer: PRIVATE HEALTH INSURANCE | Admitting: *Deleted

## 2012-10-19 DIAGNOSIS — Z7901 Long term (current) use of anticoagulants: Secondary | ICD-10-CM

## 2012-10-19 DIAGNOSIS — I4891 Unspecified atrial fibrillation: Secondary | ICD-10-CM

## 2012-10-19 LAB — POCT INR: INR: 2.8

## 2012-10-27 ENCOUNTER — Ambulatory Visit (INDEPENDENT_AMBULATORY_CARE_PROVIDER_SITE_OTHER): Payer: PRIVATE HEALTH INSURANCE | Admitting: Physician Assistant

## 2012-10-27 ENCOUNTER — Encounter: Payer: Self-pay | Admitting: Physician Assistant

## 2012-10-27 VITALS — BP 124/80 | HR 78 | Ht 66.0 in | Wt 235.0 lb

## 2012-10-27 DIAGNOSIS — I4891 Unspecified atrial fibrillation: Secondary | ICD-10-CM

## 2012-10-27 DIAGNOSIS — I10 Essential (primary) hypertension: Secondary | ICD-10-CM

## 2012-10-27 DIAGNOSIS — R079 Chest pain, unspecified: Secondary | ICD-10-CM

## 2012-10-27 DIAGNOSIS — R0602 Shortness of breath: Secondary | ICD-10-CM

## 2012-10-27 DIAGNOSIS — I5032 Chronic diastolic (congestive) heart failure: Secondary | ICD-10-CM

## 2012-10-27 LAB — CBC WITH DIFFERENTIAL/PLATELET
Basophils Absolute: 0 10*3/uL (ref 0.0–0.1)
Basophils Relative: 0.6 % (ref 0.0–3.0)
Eosinophils Absolute: 0.1 10*3/uL (ref 0.0–0.7)
Hemoglobin: 12.7 g/dL (ref 12.0–15.0)
Lymphocytes Relative: 42.4 % (ref 12.0–46.0)
MCHC: 32 g/dL (ref 30.0–36.0)
MCV: 87.7 fl (ref 78.0–100.0)
Monocytes Absolute: 0.4 10*3/uL (ref 0.1–1.0)
Monocytes Relative: 7.6 % (ref 3.0–12.0)
Neutro Abs: 2.9 10*3/uL (ref 1.4–7.7)
Platelets: 178 10*3/uL (ref 150.0–400.0)
RBC: 4.53 Mil/uL (ref 3.87–5.11)
RDW: 16 % — ABNORMAL HIGH (ref 11.5–14.6)
WBC: 5.9 10*3/uL (ref 4.5–10.5)

## 2012-10-27 LAB — BASIC METABOLIC PANEL
BUN: 17 mg/dL (ref 6–23)
CO2: 31 mEq/L (ref 19–32)
Calcium: 9.6 mg/dL (ref 8.4–10.5)
Chloride: 102 mEq/L (ref 96–112)
Creatinine, Ser: 0.7 mg/dL (ref 0.4–1.2)
GFR: 101.73 mL/min (ref >60.00)
Glucose, Bld: 109 mg/dL — ABNORMAL HIGH (ref 70–99)
Sodium: 141 mEq/L (ref 135–145)

## 2012-10-27 LAB — HEPATIC FUNCTION PANEL
ALT: 12 U/L (ref 0–35)
Albumin: 4 g/dL (ref 3.5–5.2)
Alkaline Phosphatase: 63 U/L (ref 39–117)
Total Protein: 7.3 g/dL (ref 6.0–8.3)

## 2012-10-27 MED ORDER — NITROGLYCERIN 0.4 MG SL SUBL: 0.4000 mg | SUBLINGUAL_TABLET | SUBLINGUAL | Status: DC | PRN

## 2012-10-27 NOTE — Patient Instructions (Signed)
AN RX FOR NTG HAS BEEN SENT IN TODAY FOR YOU AND YOU HAVE BEEN ADVISED AS TO HOW AND WHEN TO USE NTG  LABS TODAY; BMET, CBC W/DIFF, BNP, LFT  Your physician has requested that you have a lexiscan myoview. For further information please visit [email or web address] Please follow instruction sheet, as given.  PLEASE FOLLOW UP WITH SCOTT WEAVER, PAC IN 2 WEEKS SAME DAY DR. Shirlee Latch IS IN THE OFFICE

## 2012-10-27 NOTE — Progress Notes (Signed)
[address] [place], [place]  [address] Phone: [phone] Fax:  [phone]  Date:  10/27/2012   ID:  Bianca [name], DOB [birth year], MRN [id number]  PCP:  Clare Gandy, MD  Cardiologist:  Dr. Valera Castle => Dr. Marca Ancona     History of Present Illness: Bianca [name] is a 79 y.o. female who returns for evaluation of fatigue.  She has a hx of HTN, permanent AFib, diastolic CHF, GERD. She had a Holter monitor in the past that demonstrated bradycardia and pauses and was taken off beta blockers/AV nodal blocking agents. In the recent past, she has been placed back on Coreg and has tolerated this ok.  Echo 02/2008: EF 55%, MAC, mild MR, moderate BAE. 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  Last seen by Dr. Valera Castle in 06/2012.  Seen by her PCP in 09/2012 and Lasix adjusted for LE edema.  She feels as though she has had no energy for the last 6 months. She notes decreased exercise tolerance. She also describes a weakness or tired feeling in her chest with activity. She denies any radiating symptoms or associated nausea or diaphoresis. She does describe dyspnea with exertion. She describes NYHA class IIb symptoms. She denies syncope, near syncope or dizziness. She denies orthopnea or PND. She recently saw her PCP for increased LE edema. Her Lasix was adjusted. She's noted some minor improvement.  Labs (5/13):    K 4.4, creatinine 0.6  Labs (7/13):    LDL 97  Labs (11/13):  K 4.5, Cr 0.58 Labs (6/14):    K 4.2, Cr 0.62, ALT 13, TSH 0.589   Wt Readings from Last 3 Encounters:  10/27/12 235 lb (106.595 kg)  10/06/12 238 lb (107.956 kg)  09/02/12 236 lb (107.049 kg)     Past Medical History  Diagnosis Date  . Hypertension   . Atrial fibrillation   . Osteoarthritis   . GERD (gastroesophageal reflux disease)   . Long term current use of anticoagulant   . Chronic diastolic heart failure     Echo 02/2008: EF 55%, MAC, mild MR, moderate BAE  . Obesity   . Refusal of blood  transfusions as patient is Jehovah's Witness     Current Outpatient Prescriptions  Medication Sig Dispense Refill  . acetaminophen (TYLENOL 8 HOUR) 650 MG CR tablet Take 1 tablet (650 mg total) by mouth every 8 (eight) hours as needed for pain.  30 tablet  3  . amLODipine-benazepril (LOTREL) 10-20 MG per capsule take 1 capsule by mouth once daily  90 capsule  3  . calcium-vitamin D (OSCAL WITH D 500-200) 500-200 MG-UNIT per tablet Take 1 tablet by mouth daily.        . cholecalciferol (VITAMIN D) 1000 UNITS tablet Take 1,000 Units by mouth daily.      . clotrimazole-betamethasone (LOTRISONE) cream APPLY A GENEROUS AMOUNT TO AFFECTED AREA TWICE DAILY  90 g  2  . fish oil-omega-3 fatty acids 1000 MG capsule Take 2 g by mouth daily.      Marland Kitchen FLUZONE HIGH-DOSE injection       . furosemide (LASIX) 40 MG tablet Take 1.5 tablets (60 mg total) by mouth daily.  45 tablet  11  . Multiple Vitamin (MULTIVITAMIN) tablet Take 1 tablet by mouth daily.        Marland Kitchen NEXIUM 20 MG capsule take 1 capsule by mouth once daily  30 capsule  2  . spironolactone (ALDACTONE) 25 MG tablet  take 1 tablet by mouth once daily  30 tablet  6  . warfarin (COUMADIN) 2.5 MG tablet Take 0-2.5 mg by mouth See admin instructions. Patient takes 7.5mg  on Sun, Tues, Thurs, Sat and 5mg  on Mon, Wed, Fri.      . warfarin (COUMADIN) 5 MG tablet Take as directed by anticoagulation clinic  45 tablet  3  . ZOSTAVAX 40981 UNT/0.65ML injection        No current facility-administered medications for this visit.    Allergies:    Allergies  Allergen Reactions  . Hydrocodone Nausea And Vomiting  . Morphine     REACTION: itching  . Penicillins     REACTION: itching    Social History:  The patient  reports that she has quit smoking. She does not have any smokeless tobacco history on file. She reports that she does not drink alcohol or use illicit drugs.   Family History:  The patient's family history includes Asthma in her mother; Colon cancer  in her mother; Depression in her daughter; Diabetes in an other family member; Hypertension in an other family member.   ROS:  Please see the history of present illness.   She has occasional nausea. She denies melena, hematochezia, hematuria, hemoptysis.   All other systems reviewed and negative.   PHYSICAL EXAM: VS:  BP 124/80  Pulse 78  Ht 5\' 6"  (1.676 m)  Wt 235 lb (106.595 kg)  BMI 37.95 kg/m2 Well nourished, well developed, in no acute distress HEENT: normal Neck: no JVD Vascular: No carotid bruits, bilaterally Cardiac:  normal S1, S2; irregularly irregular rhythm; no murmur Lungs:  clear to auscultation bilaterally, no wheezing, rhonchi or rales Abd: soft, nontender, no hepatomegaly Ext: trace bilateral edema Skin: warm and dry Neuro:  CNs 2-12 intact, no focal abnormalities noted  EKG:  Atrial fibrillation, HR 78, LAD, anterolateral T wave inversions, no change from prior tracing, QTc 467     ASSESSMENT AND PLAN:  1. Chest Pain:  She presents today with complaints of fatigue, decreased exercise tolerance and a feeling of weakness in her chest at times. The weak feeling in her chest appears to be brought on by exertion. I'm somewhat concerned that this may represent exertional angina. She describes CCS class III symptoms. Her ECG is abnormal. However, it is similar to prior tracings.  She is 79 years old. She is Jehovah's Witness. She has a history of diastolic CHF. She has noted increased LE edema recently. Question if her symptoms may be related to worsening volume overload. She does not appear to be significantly overloaded on exam.  I will obtain a basic metabolic panel, CBC, LFTs and BNP. If her BNP is significantly elevated, I will adjust her Lasix further.  I will also have her undergo Lexiscan Myoview to rule out ischemia. I have given her a prescription for when necessary nitroglycerin. She will be brought back in close follow up. 2. Atrial Fibrillation:  Rate controlled. She  remains on Coumadin. She is not on any AV nodal blocking agents (she had bradycardia with beta blockers in the past). 3. Chronic Diastolic CHF:  Obtain basic metabolic panel and BNP today as noted above. Adjust medications as necessary. 4. Hypertension:  Controlled. 5. Disposition:  Follow up with Dr. Shirlee Latch or me in one to 2 weeks.  Signed, [name], PA-C  10/27/2012 10:42 AM

## 2012-10-28 NOTE — Addendum Note (Signed)
Addended by: Reesa Chew on: 10/28/2012 05:46 PM   Modules accepted: Orders

## 2012-11-06 ENCOUNTER — Encounter: Payer: Self-pay | Admitting: Cardiovascular Disease

## 2012-11-10 ENCOUNTER — Ambulatory Visit (HOSPITAL_COMMUNITY): Payer: PRIVATE HEALTH INSURANCE | Attending: Cardiology | Admitting: Radiology

## 2012-11-10 VITALS — BP 139/83 | HR 79 | Ht 66.0 in | Wt 236.0 lb

## 2012-11-10 DIAGNOSIS — I10 Essential (primary) hypertension: Secondary | ICD-10-CM | POA: Insufficient documentation

## 2012-11-10 DIAGNOSIS — I4891 Unspecified atrial fibrillation: Secondary | ICD-10-CM | POA: Insufficient documentation

## 2012-11-10 DIAGNOSIS — K3189 Other diseases of stomach and duodenum: Secondary | ICD-10-CM | POA: Insufficient documentation

## 2012-11-10 DIAGNOSIS — R0609 Other forms of dyspnea: Secondary | ICD-10-CM | POA: Insufficient documentation

## 2012-11-10 DIAGNOSIS — R0602 Shortness of breath: Secondary | ICD-10-CM | POA: Insufficient documentation

## 2012-11-10 DIAGNOSIS — R0989 Other specified symptoms and signs involving the circulatory and respiratory systems: Secondary | ICD-10-CM | POA: Insufficient documentation

## 2012-11-10 DIAGNOSIS — Z87891 Personal history of nicotine dependence: Secondary | ICD-10-CM | POA: Insufficient documentation

## 2012-11-10 DIAGNOSIS — R079 Chest pain, unspecified: Secondary | ICD-10-CM

## 2012-11-10 MED ORDER — REGADENOSON 0.4 MG/5ML IV SOLN
0.4000 mg | Freq: Once | INTRAVENOUS | Status: AC
  Administered 2012-11-10: 0.4 mg via INTRAVENOUS

## 2012-11-10 MED ORDER — TECHNETIUM TC 99M SESTAMIBI GENERIC - CARDIOLITE
10.0000 | Freq: Once | INTRAVENOUS | Status: AC | PRN
  Administered 2012-11-10: 10 via INTRAVENOUS

## 2012-11-10 MED ORDER — TECHNETIUM TC 99M SESTAMIBI GENERIC - CARDIOLITE
30.0000 | Freq: Once | INTRAVENOUS | Status: AC | PRN
  Administered 2012-11-10: 30 via INTRAVENOUS

## 2012-11-10 NOTE — Progress Notes (Signed)
[facility] SITE 3 NUCLEAR MED [address] [place], [place] [address] [phone]    Cardiology Nuclear Med Study  Bianca [name] is a 79 y.o. female     MRN : [id number]     DOB: 12-26-1930  Procedure Date: 11/10/2012  Nuclear Med Background Indication for Stress Test:  Evaluation for Ischemia History:  atrial fibrillation, no known CAD, 2005 normal NPI showing 56% EF Cardiac Risk Factors: History of Smoking and Hypertension  Symptoms:  DOE   Nuclear Pre-Procedure Caffeine/Decaff Intake:  None > 12 hrs NPO After: 9:00pm   Lungs:  clear O2 Sat: 95% on room air. IV 0.9% NS with Angio Cath:  22g  IV Site: R Antecubital x 1, tolerated well IV Started by:  Irean Hong, RN  Chest Size (in):  46 Cup Size: D  Height: 5\' 6"  (1.676 m)  Weight:  236 lb (107.049 kg)  BMI:  Body mass index is 38.11 kg/(m^2). Tech Comments: Took morning medications  (lotrel)    Nuclear Med Study 1 or 2 day study: 1 day  Stress Test Type:  Lexiscan  Reading MD: Willa Rough, MD  Order Authorizing Provider:  Marca Ancona, MD, and [name], [facility]  Resting Radionuclide: Technetium 79m Sestamibi  Resting Radionuclide Dose: 11.0 mCi   Stress Radionuclide:  Technetium 65m Sestamibi  Stress Radionuclide Dose: 33.0 mCi           Stress Protocol Rest HR: 79 Stress HR: 94  Rest BP: 139/83 Stress BP: 131/85  Exercise Time (min): n/a METS: n/a   Predicted Max HR: 140 bpm % Max HR: 67.14 bpm Rate Pressure Product: 59563   Dose of Adenosine (mg):  n/a Dose of Lexiscan: 0.4 mg  Dose of Atropine (mg): n/a Dose of Dobutamine: n/a mcg/kg/min (at max HR)  Stress Test Technologist: Nelson Chimes, BS-ES  Nuclear Technologist:  Domenic Polite, CNMT     Rest Procedure:  Myocardial perfusion imaging was performed at rest 45 minutes following the intravenous administration of Technetium 54m Sestamibi. Rest ECG: Atrial fibrillation. Decreased anterior R wave progression.  Stress Procedure:  The  patient received IV Lexiscan 0.4 mg over 15-seconds.  Technetium 42m Sestamibi injected at 30-seconds.  Quantitative spect images were obtained after a 45 minute delay. Patient complained of SOB and stomach upset with Lexiscan.  The symptoms began to resolve in recovery.  Stress ECG: No significant change from baseline ECG  QPS Raw Data Images:  Normal; no motion artifact; normal heart/lung ratio. Stress Images:  There is a medium-sized area of moderate decreased uptake affecting base/mid anteroseptal segments and base/mid/apical inferior segments and the apical cap. This is a fixed defect Rest Images:  The rest images are the same as the stress images. Subtraction (SDS):  No evidence of ischemia. Transient Ischemic Dilatation (Normal <1.22):  1.06 Lung/Heart Ratio (Normal <0.45):  0.28  Quantitative Gated Spect Images QGS EDV:  n/a ml QGS ESV:  n/a ml  Impression Exercise Capacity:  Lexiscan with no exercise. BP Response:  Normal blood pressure response. Clinical Symptoms:  Shortness of breath ECG Impression:  No significant ST segment change suggestive of ischemia. Comparison with Prior Nuclear Study: No images to compare  Overall Impression:  There is a medium-sized fixed defect in the inferior wall and inferoseptal wall. There is no wall motion assessment available. Therefore I cannot tell at this is an area of mild scar or if it is due to attenuation. There is no definite ischemia. I cannot assess the  risk of this scan because wall motion data is not available.  LV Ejection Fraction: Study not gated.  LV Wall Motion:   The study could not be gated. Therefore wall motion data is not available.  Willa Rough, MD

## 2012-11-11 ENCOUNTER — Encounter: Payer: Self-pay | Admitting: Physician Assistant

## 2012-11-12 ENCOUNTER — Ambulatory Visit (INDEPENDENT_AMBULATORY_CARE_PROVIDER_SITE_OTHER): Payer: PRIVATE HEALTH INSURANCE | Admitting: Physician Assistant

## 2012-11-12 ENCOUNTER — Encounter: Payer: Self-pay | Admitting: Physician Assistant

## 2012-11-12 VITALS — BP 130/81 | HR 82 | Ht 66.0 in | Wt 239.0 lb

## 2012-11-12 DIAGNOSIS — R5383 Other fatigue: Secondary | ICD-10-CM

## 2012-11-12 DIAGNOSIS — I10 Essential (primary) hypertension: Secondary | ICD-10-CM

## 2012-11-12 DIAGNOSIS — R079 Chest pain, unspecified: Secondary | ICD-10-CM

## 2012-11-12 DIAGNOSIS — R9439 Abnormal result of other cardiovascular function study: Secondary | ICD-10-CM

## 2012-11-12 DIAGNOSIS — I5032 Chronic diastolic (congestive) heart failure: Secondary | ICD-10-CM

## 2012-11-12 DIAGNOSIS — R5381 Other malaise: Secondary | ICD-10-CM

## 2012-11-12 DIAGNOSIS — I4891 Unspecified atrial fibrillation: Secondary | ICD-10-CM

## 2012-11-12 NOTE — Patient Instructions (Signed)
Your physician wants you to follow-up with DR. MCLEAN IN 3 MONTHS.  You will receive a reminder letter in the mail two months in advance. If you don't receive a letter, please call our office to schedule the follow-up appointment.   Your physician has requested that you have an echocardiogram DX: ABNORMAL STRESS TEST Echocardiography is a painless test that uses sound waves to create images of your heart. It provides your doctor with information about the size and shape of your heart and how well your heart's chambers and valves are working. This procedure takes approximately one hour. There are no restrictions for this procedure.   Your physician recommends that you continue on your current medications as directed. Please refer to the Current Medication list given to you today.

## 2012-11-12 NOTE — Progress Notes (Signed)
[address] [place], [place]  [address] Phone: [phone] Fax:  [phone]  Date:  11/12/2012   ID:  Bianca [name], DOB 06/19/1930, MRN [id number]  PCP:  Clare Gandy, MD  Cardiologist:  Dr. Valera Castle => Dr. Marca Ancona     History of Present Illness: Bianca [name] is a 79 y.o. female who returns for f/u.    She has a hx of HTN, permanent AFib, diastolic CHF, GERD. She had a Holter monitor in the past that demonstrated bradycardia and pauses and was taken off beta blockers/AV nodal blocking agents. In the recent past, she has been placed back on Coreg and has tolerated this ok.  Echo 02/2008: EF 55%, MAC, mild MR, moderate BAE. 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  Last seen by Dr. Valera Castle in 06/2012.  Seen by her PCP in 09/2012 and Lasix adjusted for LE edema.  I saw her recently for complaints of fatigue for 6 months as well as a "tired feeling" in her chest with activity and dyspnea with exertion. Labs demonstrated no significant abnormalities (BNP normal). I arranged a Lexiscan Myoview. This was completed yesterday. There was an inferior defect. Study was not gated. It was not certain if this represented attenuation or scar. There was no ischemia. Risk assessment not complete. I have requested an echocardiogram.  Since last seen, she continues to note fatigue. She tires out easily with most activities. She really denies significant dyspnea of exertion. She probably describes NYHA class II-IIb symptoms. She denies orthopnea, PND.  LE edema stable. She denies chest pressure or heaviness.  Labs (5/13):    K 4.4, creatinine 0.6  Labs (7/13):    LDL 97  Labs (11/13):  K 4.5, Cr 0.58 Labs (6/14):    K 4.2, Cr 0.62, ALT 13, TSH 0.589 Labs (10/14):  K 3.8, creatinine 0.7, ALT 12, BNP 96, Hgb 12.7  Wt Readings from Last 3 Encounters:  11/12/12 239 lb (108.41 kg)  11/10/12 236 lb (107.049 kg)  10/27/12 235 lb (106.595 kg)     Past Medical History  Diagnosis Date  .  Hypertension   . Atrial fibrillation   . Osteoarthritis   . GERD (gastroesophageal reflux disease)   . Long term current use of anticoagulant   . Chronic diastolic heart failure     Echo 02/2008: EF 55%, MAC, mild MR, moderate BAE  . Obesity   . Refusal of blood transfusions as patient is Jehovah's Witness   . Hx of cardiovascular stress test     Lexiscan Myoview (10/14):  Fixed inf defect, no ischemia, study not gated    Current Outpatient Prescriptions  Medication Sig Dispense Refill  . acetaminophen (TYLENOL 8 HOUR) 650 MG CR tablet Take 1 tablet (650 mg total) by mouth every 8 (eight) hours as needed for pain.  30 tablet  3  . amLODipine-benazepril (LOTREL) 10-20 MG per capsule take 1 capsule by mouth once daily  90 capsule  3  . calcium-vitamin D (OSCAL WITH D 500-200) 500-200 MG-UNIT per tablet Take 1 tablet by mouth daily.        . cholecalciferol (VITAMIN D) 1000 UNITS tablet Take 1,000 Units by mouth daily.      . clotrimazole-betamethasone (LOTRISONE) cream APPLY A GENEROUS AMOUNT TO AFFECTED AREA TWICE DAILY  90 g  2  . fish oil-omega-3 fatty acids 1000 MG capsule Take 2 g by mouth daily.      Marland Kitchen FLUZONE HIGH-DOSE injection       .  furosemide (LASIX) 40 MG tablet Take 1.5 tablets (60 mg total) by mouth daily.  45 tablet  11  . Multiple Vitamin (MULTIVITAMIN) tablet Take 1 tablet by mouth daily.        Marland Kitchen NEXIUM 20 MG capsule take 1 capsule by mouth once daily  30 capsule  2  . nitroGLYCERIN (NITROSTAT) 0.4 MG SL tablet Place 1 tablet (0.4 mg total) under the tongue every 5 (five) minutes as needed for chest pain.  25 tablet  3  . spironolactone (ALDACTONE) 25 MG tablet take 1 tablet by mouth once daily  30 tablet  6  . warfarin (COUMADIN) 2.5 MG tablet Take 0-2.5 mg by mouth See admin instructions. Patient takes 7.5mg  on Sun, Tues, Thurs, Sat and 5mg  on Mon, Wed, Fri.      . warfarin (COUMADIN) 5 MG tablet Take as directed by anticoagulation clinic  45 tablet  3  . ZOSTAVAX 40981  UNT/0.65ML injection        No current facility-administered medications for this visit.    Allergies:    Allergies  Allergen Reactions  . Hydrocodone Nausea And Vomiting  . Morphine     REACTION: itching  . Penicillins     REACTION: itching    Social History:  The patient  reports that she has quit smoking. She does not have any smokeless tobacco history on file. She reports that she does not drink alcohol or use illicit drugs.   Family History:  The patient's family history includes Asthma in her mother; Colon cancer in her mother; Depression in her daughter; Diabetes in an other family member; Hypertension in an other family member.   ROS:  Please see the history of present illness.      All other systems reviewed and negative.   PHYSICAL EXAM: VS:  BP 130/81  Pulse 82  Ht 5\' 6"  (1.676 m)  Wt 239 lb (108.41 kg)  BMI 38.59 kg/m2 Well nourished, well developed, in no acute distress HEENT: normal Neck: no JVD Cardiac:  normal S1, S2; irregularly irregular rhythm; no murmur Lungs:  clear to auscultation bilaterally, no wheezing, rhonchi or rales Abd: soft, nontender, no hepatomegaly Ext: trace bilateral ankle edema Skin: warm and dry Neuro:  CNs 2-12 intact, no focal abnormalities noted  EKG:   Atrial fibrillation, HR 82, LAD, no change from prior tracings     ASSESSMENT AND PLAN:  1. Chest Pain:  She specifically denies chest pain, pressure or heaviness. She had previously described a tired feeling. Her main complaint is that of fatigue. Stress test demonstrates no ischemia but scar in the inferior wall cannot be ruled out (versus attenuation). Arrange an echocardiogram. If this is normal, no further workup. At that point I would suggest that she follow up with her PCP for further evaluation of her fatigue. If she has inferior wall motion abnormality, consider the addition of nitrates to see if this improves symptoms. 2. Fatigue:  Workup unrevealing.  May be from  deconditioning.  If Echo ok, f/u with PCP. 3. Atrial Fibrillation:  Rate controlled.  Continue warfarin. 4. Chronic Diastolic CHF:  Volume stable. 5. Hypertension:  Controlled. 6. Disposition:  Follow up with Dr. Marca Ancona in 3 mos.  Signed, [name], PA-C  11/12/2012 8:53 AM

## 2012-11-13 ENCOUNTER — Telehealth: Payer: Self-pay | Admitting: *Deleted

## 2012-11-13 DIAGNOSIS — I4891 Unspecified atrial fibrillation: Secondary | ICD-10-CM

## 2012-11-13 DIAGNOSIS — I5032 Chronic diastolic (congestive) heart failure: Secondary | ICD-10-CM

## 2012-11-13 NOTE — Telephone Encounter (Signed)
lmom on son's phone for ptcb go over test results and needing echo done

## 2012-11-13 NOTE — Telephone Encounter (Signed)
Message copied by Tarri Fuller on Fri Nov 13, 2012  4:10 PM ------      Message from: [place], [place] T      Created: Wed Nov 11, 2012 10:05 PM       No ischemia      There is an inf defect (?scar vs attenuation)      Schedule echo to assess wall motion and rule out inf wall motion abnormality      [name], PA-C        11/11/2012 10:05 PM ------

## 2012-11-16 NOTE — Telephone Encounter (Signed)
Follow Up   Pt returning Call

## 2012-11-16 NOTE — Telephone Encounter (Signed)
Routed to [employer]

## 2012-11-17 ENCOUNTER — Telehealth: Payer: Self-pay

## 2012-11-17 NOTE — Telephone Encounter (Signed)
cb pt's son Onalee Hua about reason why needing echo, he also asked me if we had pt's phone #; said only have [phone]; Son said pt's home # is [phone] and we can call that # to reach the pt. I said thank you and we will sched. echo for pt. Pt scheduled for echo 11/30/12

## 2012-11-17 NOTE — Telephone Encounter (Signed)
cb pt's son David about reason why needing echo, he also asked me if we had pt's phone #; said only have [phone]; Son said pt's home # is [phone] and we can call that # to reach the pt. I said thank you and we will sched. echo for pt. Pt scheduled for echo 11/30/12 

## 2012-11-17 NOTE — Telephone Encounter (Signed)
New problem    Patient calling back to speak with Coy Saunas.

## 2012-11-18 NOTE — Telephone Encounter (Signed)
Line busy will try later

## 2012-11-18 NOTE — Telephone Encounter (Signed)
cb pt to just let he know that I was just calling about the echo appt same day as CVRR appt 11/17; pt said ok and thank you. 

## 2012-11-18 NOTE — Telephone Encounter (Signed)
cb pt to just let he know that I was just calling about the echo appt same day as CVRR appt 11/17; pt said ok and thank you.

## 2012-11-30 ENCOUNTER — Encounter: Payer: Self-pay | Admitting: Physician Assistant

## 2012-11-30 ENCOUNTER — Other Ambulatory Visit (HOSPITAL_COMMUNITY): Payer: PRIVATE HEALTH INSURANCE

## 2012-11-30 ENCOUNTER — Ambulatory Visit (INDEPENDENT_AMBULATORY_CARE_PROVIDER_SITE_OTHER): Payer: PRIVATE HEALTH INSURANCE | Admitting: Pharmacist

## 2012-11-30 ENCOUNTER — Ambulatory Visit (HOSPITAL_COMMUNITY): Payer: PRIVATE HEALTH INSURANCE | Attending: Physician Assistant | Admitting: Radiology

## 2012-11-30 ENCOUNTER — Encounter: Payer: Self-pay | Admitting: Cardiology

## 2012-11-30 DIAGNOSIS — I4891 Unspecified atrial fibrillation: Secondary | ICD-10-CM

## 2012-11-30 DIAGNOSIS — R9439 Abnormal result of other cardiovascular function study: Secondary | ICD-10-CM

## 2012-11-30 DIAGNOSIS — I079 Rheumatic tricuspid valve disease, unspecified: Secondary | ICD-10-CM | POA: Insufficient documentation

## 2012-11-30 DIAGNOSIS — R609 Edema, unspecified: Secondary | ICD-10-CM | POA: Insufficient documentation

## 2012-11-30 DIAGNOSIS — R079 Chest pain, unspecified: Secondary | ICD-10-CM

## 2012-11-30 DIAGNOSIS — Z87891 Personal history of nicotine dependence: Secondary | ICD-10-CM | POA: Insufficient documentation

## 2012-11-30 DIAGNOSIS — Z7901 Long term (current) use of anticoagulants: Secondary | ICD-10-CM

## 2012-11-30 DIAGNOSIS — R5381 Other malaise: Secondary | ICD-10-CM | POA: Insufficient documentation

## 2012-11-30 LAB — POCT INR: INR: 2.9

## 2012-11-30 NOTE — Patient Instructions (Signed)
Try to eat 3 servings of green vegetables a week.

## 2012-11-30 NOTE — Progress Notes (Signed)
Echocardiogram performed.  

## 2012-12-08 ENCOUNTER — Encounter: Payer: Self-pay | Admitting: Family Medicine

## 2012-12-08 ENCOUNTER — Ambulatory Visit (INDEPENDENT_AMBULATORY_CARE_PROVIDER_SITE_OTHER): Payer: PRIVATE HEALTH INSURANCE | Admitting: Family Medicine

## 2012-12-08 VITALS — BP 128/80 | HR 91 | Temp 97.8°F | Ht 66.0 in | Wt 235.0 lb

## 2012-12-08 DIAGNOSIS — R600 Localized edema: Secondary | ICD-10-CM

## 2012-12-08 DIAGNOSIS — I10 Essential (primary) hypertension: Secondary | ICD-10-CM

## 2012-12-08 DIAGNOSIS — Z131 Encounter for screening for diabetes mellitus: Secondary | ICD-10-CM

## 2012-12-08 DIAGNOSIS — R609 Edema, unspecified: Secondary | ICD-10-CM

## 2012-12-08 LAB — POCT GLYCOSYLATED HEMOGLOBIN (HGB A1C): Hemoglobin A1C: 5.7

## 2012-12-08 MED ORDER — AMLODIPINE BESY-BENAZEPRIL HCL 5-20 MG PO CAPS: 1.0000 | ORAL_CAPSULE | Freq: Every day | ORAL | Status: DC

## 2012-12-08 NOTE — Patient Instructions (Signed)
Thank you for coming in,   I am going to change your amlodipine to a smaller dose. Hopefully this will reduce the swelling in your legs. Please try to keep your legs elevated as much as you can. Please visit [email or web address]. Order a pair of the microfiber compresssion in 15-20 moderate compression. Or call the phone number is [phone], please ask for Sales. Wearing compression hose is the most important thing that you can do. We ruled out that it was not your heart by having a normal echocardiogram.   I have sent a new prescription of amlodipine/benzipril. Please try to check your blood pressure and let me know what it is running.   I will be measuring a lab test to check your sugar level and I will call you with the results. This is in regards to wondering if you have diabetes or not.   Please follow up in 6 months or sooner if your swelling doesn't go away or if it gets worse.    Please feel free to call with any questions or concerns at any time, at [phone]. --Dr. Jordan Likes

## 2012-12-08 NOTE — Progress Notes (Signed)
Subjective:     Patient ID: Bianca [name], female   DOB: 01-Nov-1930, 79 y.o.   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   #Leg swelling: She was seen by me in August for similar symptoms An extra dose of Furosemide was added at that time and she was encouraged to wear compressions stockings. She also has seen the Cardiologist since then and had a normal Echo. She has been on Amlodipine/Benzapril for over a year and has not had swelling like this since taking this medication. She notes that it is worse upon standing and goes away while lying down. She doesn't wear her TED hose because they are too difficult to put on. She doesn't complain of any pain in her legs but does acknowledge a "weird" sensation in her anterior shins. She denies any trauma to her shins and does not have any of the sensation in her feet. She denies a cough, chest pain or abdominal distention. She does become short of breath when she walks to the mail box but this isn't anything new for her.   #question about diabetes: She was seen by her Optometrist about possibly being diabetic. She has had implants in her eyes in April and May that were for cataracts. Her eye doctor noticed something on exam and encouraged her to be checked out. She denies any paresthesia in her hands, fingers, toes or feet. She denies any increased urination. She denies any episodes of feeling hot and sweaty if she doesn't eat. Her last Hgb A1c was 6.1.  She does report weakness in her muscles but nothing out of the ordinary or anything new for her.   Health Maintenance: She is up to date on her health maintenance except for her zostervax.   Review of Systems All other systems reviewed and otherwise normal.      Objective:   Physical Exam BP 128/80  Pulse 91  Temp(Src) 97.8 F (36.6 C) (Oral)  Ht 5\' 6"  (1.676 m)  Wt 235 lb (106.595 kg)  BMI 37.95 kg/m2 Gen: NAD,  alert, cooperative with exam HEENT: NCAT, EOMI CV: RRR, good S1/S2, no murmur Resp: CTABL, no wheezes, non-labored Abd: SNTND, BS present, no guarding or organomegaly Ext: +1 edema bilaterally in her lower extremities to her knees, warm MSK: 5/5 strength in her UE and LE. Normal sensation in her UE and LE. Some tenderness on left shin upon palpation but none on the right shin  Neuro: Alert and oriented, No gross deficits     Assessment:         Plan:

## 2012-12-08 NOTE — Assessment & Plan Note (Signed)
Swelling bilaterally in her lower extremities.  - encouraged her to buy and wear compression stockings from [employer]  - advised to stay off feet and elevate - decreased amlodipine from 10 mg to 5 mg

## 2012-12-08 NOTE — Assessment & Plan Note (Signed)
BP 128/80. Will decrease her Amlodipine/ Benzapril 5-20.  - possible that the Amloidpine is causing her swelling  - encouraged to check her bp once a week  - informed her that she could schedule a nurse's visit her at the clinic  - would rather have her bp on the higher side than lower side due to age to avoid hypotension.

## 2013-01-11 ENCOUNTER — Ambulatory Visit (INDEPENDENT_AMBULATORY_CARE_PROVIDER_SITE_OTHER): Payer: PRIVATE HEALTH INSURANCE

## 2013-01-11 DIAGNOSIS — Z7901 Long term (current) use of anticoagulants: Secondary | ICD-10-CM

## 2013-01-11 DIAGNOSIS — I4891 Unspecified atrial fibrillation: Secondary | ICD-10-CM

## 2013-01-11 LAB — POCT INR: INR: 3.5

## 2013-01-19 ENCOUNTER — Ambulatory Visit (INDEPENDENT_AMBULATORY_CARE_PROVIDER_SITE_OTHER): Payer: PRIVATE HEALTH INSURANCE | Admitting: Cardiology

## 2013-01-19 ENCOUNTER — Encounter: Payer: Self-pay | Admitting: Cardiology

## 2013-01-19 ENCOUNTER — Telehealth: Payer: Self-pay | Admitting: Cardiology

## 2013-01-19 VITALS — BP 118/88 | HR 92 | Ht 66.0 in | Wt 236.0 lb

## 2013-01-19 DIAGNOSIS — I4891 Unspecified atrial fibrillation: Secondary | ICD-10-CM

## 2013-01-19 DIAGNOSIS — I10 Essential (primary) hypertension: Secondary | ICD-10-CM

## 2013-01-19 DIAGNOSIS — R0602 Shortness of breath: Secondary | ICD-10-CM

## 2013-01-19 DIAGNOSIS — I5032 Chronic diastolic (congestive) heart failure: Secondary | ICD-10-CM

## 2013-01-19 LAB — BASIC METABOLIC PANEL
BUN: 19 mg/dL (ref 6–23)
CALCIUM: 9.6 mg/dL (ref 8.4–10.5)
CO2: 30 meq/L (ref 19–32)
Chloride: 103 mEq/L (ref 96–112)
Creatinine, Ser: 0.7 mg/dL (ref 0.4–1.2)
GFR: 103.35 mL/min (ref >60.00)
GLUCOSE: 105 mg/dL — AB (ref 70–99)
POTASSIUM: 4.3 meq/L (ref 3.5–5.1)
Sodium: 138 mEq/L (ref 135–145)

## 2013-01-19 LAB — LIPID PANEL
CHOLESTEROL: 161 mg/dL (ref 0–200)
HDL: 40 mg/dL (ref >39.00)
LDL CALC: 101 mg/dL — AB (ref 0–99)
TRIGLYCERIDES: 101 mg/dL (ref 0.0–149.0)
Total CHOL/HDL Ratio: 4
VLDL: 20.2 mg/dL (ref 0.0–40.0)

## 2013-01-19 LAB — PROTIME-INR
INR: 2.8 ratio — AB (ref 0.8–1.0)
Prothrombin Time: 28.8 s — ABNORMAL HIGH (ref 10.2–12.4)

## 2013-01-19 LAB — BRAIN NATRIURETIC PEPTIDE: Pro B Natriuretic peptide (BNP): 99 pg/mL (ref 0.0–100.0)

## 2013-01-19 MED ORDER — APIXABAN 5 MG PO TABS: 5.0000 mg | ORAL_TABLET | Freq: Two times a day (BID) | ORAL | Status: DC

## 2013-01-19 MED ORDER — FUROSEMIDE 40 MG PO TABS: ORAL_TABLET | ORAL | Status: DC

## 2013-01-19 NOTE — Telephone Encounter (Signed)
This has been done.

## 2013-01-19 NOTE — Progress Notes (Signed)
Patient ID: Bianca [name], female   DOB: May 14, 1930, 80 y.o.   MRN: [id number] PCP: Dr. Raeford Razor  80 yo with history of permanent atrial fibrillation and diastolic CHF presents for cardiology followup.  She has been seen by Dr. Verl Blalock in the past and is seen by me for the first time today.  I reviewed all her old records.  She lives alone.  She continues to drive and does her own shopping, etc.  She has lower extremity edema.  This has been better since she cut back on her amlodipine.  She was taking 10 mg daily, now she is taking 5 mg daily alternating with 10 mg daily.  If she just takes 5 mg daily, her BP starts to run high. She has not tolerated increasing benazepril in the past. No chest pain.  She is short of breath after walking 50-60 feet to her mailbox.  No orthopnea, PND, falls, or syncope/lightheadedness.   Labs (10/14): K 3.8, creatinine 0.7, BNP 96, HCT 39.7  PMH: 1. Permanent atrial fibrillation: Holter monitor in the past showed bradycardia/pauses, so she is not on nodal blockers.  2. HTN 3. Chronic diastolic CHF: Echo (66/06) with EF 60-65%, mild LVH, moderately dilated RV with moderately decreased RV systolic function.  4. GERD 5. OA 6. Obesity 7. Lexiscan Cardiolite (10/14) with medium-sized inferior and inferoseptal fixed defect (old MI versus attenuation) and no ischemia.   SH: Widow, nonsmoker, lives in [place], [place].   FH: No premature CAD  ROS: All systems reviewed and negative except as per HPI.   Current Outpatient Prescriptions  Medication Sig Dispense Refill  . acetaminophen (TYLENOL 8 HOUR) 650 MG CR tablet Take 1 tablet (650 mg total) by mouth every 8 (eight) hours as needed for pain.  30 tablet  3  . amLODipine-benazepril (LOTREL) 5-20 MG per capsule Take 1 capsule by mouth daily.  30 capsule  1  . calcium-vitamin D (OSCAL WITH D 500-200) 500-200 MG-UNIT per tablet Take 1 tablet by mouth daily.        . Cholecalciferol (VITAMIN D-3) 1000 UNITS CAPS  Take by mouth daily.      . fish oil-omega-3 fatty acids 1000 MG capsule Take 2 g by mouth daily.      . Multiple Vitamin (MULTIVITAMIN) tablet Take 1 tablet by mouth daily.        Marland Kitchen NEXIUM 20 MG capsule take 1 capsule by mouth once daily  30 capsule  2  . nitroGLYCERIN (NITROSTAT) 0.4 MG SL tablet Place 1 tablet (0.4 mg total) under the tongue every 5 (five) minutes as needed for chest pain.  25 tablet  3  . spironolactone (ALDACTONE) 25 MG tablet take 1 tablet by mouth once daily  30 tablet  6  . apixaban (ELIQUIS) 5 MG TABS tablet Take 1 tablet (5 mg total) by mouth 2 (two) times daily.  60 tablet  11  . clotrimazole-betamethasone (LOTRISONE) cream APPLY A GENEROUS AMOUNT TO AFFECTED AREA TWICE DAILY  90 g  2  . furosemide (LASIX) 40 MG tablet 1 and 1/2 tablets (total 60mg )  in the AM and 1 tablet (total 40mg ) around 4PM EVERY OTHER DAY  75 tablet  3   No current facility-administered medications for this visit.   BP 118/88  Pulse 92  Ht 5\' 6"  (1.676 m)  Wt 107.049 kg (236 lb)  BMI 38.11 kg/m2  SpO2 99% General: NAD Neck: JVP 8-9 cm, no thyromegaly or thyroid nodule.  Lungs: Clear to  auscultation bilaterally with normal respiratory effort. CV: Nondisplaced PMI.  Heart irregular S1/S2, no S3/S4, no murmur.  1+ ankle edema.  No carotid bruit.  Normal pedal pulses.  Abdomen: Soft, nontender, no hepatosplenomegaly, no distention.  Skin: Intact without lesions or rashes.  Neurologic: Alert and oriented x 3.  Psych: Normal affect. Extremities: No clubbing or cyanosis.   Assessment/Plan: 1. Chronic diastolic CHF: Patient is at least mildly volume overloaded on exam with NYHA class II-III symptoms.  - Increase Lasix to 60 mg qam and every other day 40 mg in the afternoon.  - BMET/BNP today and BMET 2 wks.  2. Permanent atrial fibrillation: Rate is controlled off nodal blockers (had bradycardia on nodal blockers).  She is on warfarin but wants to stop INR checks.  I will have her transition  to Eliquis 5 mg bid, starting when INR < 2.  3. Lexiscan Cardiolite in 10/14 showed inferior/inferoseptal fixed defect but no ischemia.  This could have been attenuation.  She has had no chest pain. 4. Hyperlipidemia: Check lipids today.  5. HTN: BP is controlled.  6. Followup in 2 months.   Loralie Champagne 01/19/2013

## 2013-01-19 NOTE — Patient Instructions (Signed)
Increase lasix(furosemide) to take 40mg  around [place]. Continue taking 60mg  (1 and 1/2 tablets) in the AM.  You can change from warfarin (coumadin) to Eliquis 5mg  two times a day when your INR in 2 or less. Dr Aundra Dubin is checking your INR today. We will call and give your instructions about stopping coumadin and starting Eliquis. I have given you a written prescription and co-pay card for Eliquis.   If Eliquis is too expensive for you Dr Aundra Dubin can prescribe Xarelto 20mg  daily. Let me know if you cannot get Eliquis. Desiree Lucy, RN. [phone].  Your physician recommends that you have lab work today--BMET/Lipid profile/BNP/INR/PT.   Your physician recommends that you return for lab work in: 2 weeks--BMET.  Your physician recommends that you schedule a follow-up appointment in: 2 months with Dr Aundra Dubin.

## 2013-01-19 NOTE — Telephone Encounter (Signed)
New Problem:  Pt states she thought Dr. Aundra Dubin had gave her a Rx for eliquis... She went to the pharmacy to inquire about how much it would be and the pharmacist stated she did not have a Rx for eliquis and she would need to call her doctor. Pt is requesting a call back from Gary.

## 2013-01-20 ENCOUNTER — Telehealth: Payer: Self-pay | Admitting: Cardiology

## 2013-01-20 NOTE — Telephone Encounter (Signed)
Patient is returning your call, please call back she is now home.

## 2013-01-20 NOTE — Telephone Encounter (Signed)
Pt to stop coumadin today start Eliquis 01/22/13 per Sally/Dr Aundra Dubin. Pt advised, verbalized understanding.

## 2013-01-29 ENCOUNTER — Telehealth: Payer: Self-pay | Admitting: Cardiology

## 2013-01-29 ENCOUNTER — Other Ambulatory Visit (INDEPENDENT_AMBULATORY_CARE_PROVIDER_SITE_OTHER): Payer: PRIVATE HEALTH INSURANCE

## 2013-01-29 DIAGNOSIS — I5032 Chronic diastolic (congestive) heart failure: Secondary | ICD-10-CM

## 2013-01-29 DIAGNOSIS — I4891 Unspecified atrial fibrillation: Secondary | ICD-10-CM

## 2013-01-29 DIAGNOSIS — R252 Cramp and spasm: Secondary | ICD-10-CM

## 2013-01-29 LAB — BASIC METABOLIC PANEL
BUN: 14 mg/dL (ref 6–23)
CALCIUM: 9.1 mg/dL (ref 8.4–10.5)
CHLORIDE: 101 meq/L (ref 96–112)
CO2: 30 mEq/L (ref 19–32)
CREATININE: 0.7 mg/dL (ref 0.4–1.2)
GFR: 106.85 mL/min (ref >60.00)
Glucose, Bld: 131 mg/dL — ABNORMAL HIGH (ref 70–99)
Potassium: 3.7 mEq/L (ref 3.5–5.1)
Sodium: 137 mEq/L (ref 135–145)

## 2013-01-29 LAB — MAGNESIUM: Magnesium: 1.7 mg/dL (ref 1.5–2.5)

## 2013-01-29 NOTE — Telephone Encounter (Signed)
Can she check her BP when she is dizzy? Also needs to have BMET and magnesium checked, cramping could be electrolyte-related.

## 2013-01-29 NOTE — Telephone Encounter (Signed)
May be transient BP drop.  Try using cuff right when it happens.

## 2013-01-29 NOTE — Telephone Encounter (Signed)
Patient states that since around Monday she has been waking up in the morning feeling "funny" in the head. Once she gets up and moves around it does go away. She is also having cramping in her left leg. Is concerned since her diuretics were increased and started Eliquis. Patient is scheduled for labs next week. Will forward to Dr Aundra Dubin for review.

## 2013-01-29 NOTE — Telephone Encounter (Signed)
Scheduled labs for patient today. Patient only has a wrist monitor and when she checks blood pressure it is ok. Patient does not check first thing when she has symptoms. Recommended to patient getting a arm band blood pressure machine but states she does not have the money to buy a new machine, but does not think hers is accurate. She again states just happens first thing when she gets up feeling in her head goes away.

## 2013-01-29 NOTE — Telephone Encounter (Signed)
No answer, will forward to Bianca [name]

## 2013-01-29 NOTE — Telephone Encounter (Signed)
New message     Talk to a nurse about some medication she is taking.

## 2013-01-29 NOTE — Telephone Encounter (Signed)
No answer x 2 will try again later 

## 2013-01-29 NOTE — Telephone Encounter (Signed)
Will forward to Dr McLean 

## 2013-02-01 NOTE — Telephone Encounter (Signed)
Patient is returning your call, please call patient [phone].

## 2013-02-01 NOTE — Telephone Encounter (Signed)
Spoke with patient--does not have dietary restrictions per Gay Filler P. Pt advised.

## 2013-02-01 NOTE — Telephone Encounter (Signed)
Follow Up  Pt calling for blood test results. Pt also says she was placed on ELIQUIS and she wonders if she has to watch the amount of "greens" with this medication. Please call back to discuss.

## 2013-02-02 ENCOUNTER — Other Ambulatory Visit: Payer: PRIVATE HEALTH INSURANCE

## 2013-02-02 NOTE — Progress Notes (Signed)
Patient was approved for Eliquis  mg until 01/20/14 ID [id number]            [place]  [phone]

## 2013-02-11 ENCOUNTER — Other Ambulatory Visit: Payer: Self-pay | Admitting: Family Medicine

## 2013-02-11 DIAGNOSIS — I10 Essential (primary) hypertension: Secondary | ICD-10-CM

## 2013-02-11 MED ORDER — AMLODIPINE BESY-BENAZEPRIL HCL 5-20 MG PO CAPS
1.0000 | ORAL_CAPSULE | Freq: Every day | ORAL | Status: DC
Start: 2013-02-11 — End: 2013-07-28

## 2013-02-20 ENCOUNTER — Emergency Department (INDEPENDENT_AMBULATORY_CARE_PROVIDER_SITE_OTHER)
Admission: EM | Admit: 2013-02-20 | Discharge: 2013-02-20 | Disposition: A | Discharge status: Home / Self Care | Payer: PRIVATE HEALTH INSURANCE | Source: Home / Self Care | Attending: Family Medicine | Admitting: Family Medicine

## 2013-02-20 ENCOUNTER — Encounter (HOSPITAL_COMMUNITY): Payer: Self-pay | Admitting: Emergency Medicine

## 2013-02-20 DIAGNOSIS — F458 Other somatoform disorders: Secondary | ICD-10-CM

## 2013-02-20 DIAGNOSIS — R0989 Other specified symptoms and signs involving the circulatory and respiratory systems: Secondary | ICD-10-CM

## 2013-02-20 NOTE — Discharge Instructions (Signed)
Thank you for coming in today. Continue nexium.  Eat soft food.  Follow up with gastroenterology Monday.  Call and make an appointment.  If you get worse go to the ER.  If your belly pain worsens, or you have high fever, bad vomiting, blood in your stool or black tarry stool go to the Emergency Room.

## 2013-02-20 NOTE — ED Notes (Signed)
Dr. Georgina Snell is in the room w/the pt Pt reports she feels something lodged/stuck in the back of throat after eating Reports she ate a fish sandwich  Alert w/no signs acute distress... Talking in complete sentences.

## 2013-02-20 NOTE — ED Provider Notes (Signed)
Bianca [name] is a 80 y.o. female who presents to Urgent Care today for globus sensation. Patient has a sensation that something is stuck in her throat. This happened today after eating a fish sandwich at [employer]. She is able to eat and drink normally. She denies any vomiting or diarrhea. She feels well otherwise.  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   Past Medical History  Diagnosis Date  . Hypertension   . Atrial fibrillation   . Osteoarthritis   . GERD (gastroesophageal reflux disease)   . Long term current use of anticoagulant   . Chronic diastolic heart failure     Echo 02/2008: EF 55%, MAC, mild MR, moderate BAE  . Obesity   . Refusal of blood transfusions as patient is Jehovah's Witness   . Hx of cardiovascular stress test     Lexiscan Myoview (10/14):  Fixed inf defect, no ischemia, study not gated  . Hx of echocardiogram     Echo (11/14):  Mild LVH, EF 60-65%, mod to severe LAE, mod RVE, mild to mod reduced RVSF, mild RAE, PASP 38   History  Substance Use Topics  . Smoking status: Former Research scientist (life sciences)  . Smokeless tobacco: Not on file  . Alcohol Use: No   ROS as above Medications: No current facility-administered medications for this encounter.   Current Outpatient Prescriptions  Medication Sig Dispense Refill  . acetaminophen (TYLENOL 8 HOUR) 650 MG CR tablet Take 1 tablet (650 mg total) by mouth every 8 (eight) hours as needed for pain.  30 tablet  3  . amLODipine-benazepril (LOTREL) 5-20 MG per capsule Take 1 capsule by mouth daily.  90 capsule  3  . apixaban (ELIQUIS) 5 MG TABS tablet Take 1 tablet (5 mg total) by mouth 2 (two) times daily.  60 tablet  11  . calcium-vitamin D (OSCAL WITH D 500-200) 500-200 MG-UNIT per tablet Take 1 tablet by mouth daily.        . Cholecalciferol (VITAMIN D-3) 1000 UNITS CAPS Take by mouth daily.      . clotrimazole-betamethasone (LOTRISONE) cream APPLY A GENEROUS AMOUNT TO AFFECTED AREA TWICE DAILY  90 g  2  . fish  oil-omega-3 fatty acids 1000 MG capsule Take 2 g by mouth daily.      . furosemide (LASIX) 40 MG tablet 1 and 1/2 tablets (total 60mg )  in the AM and 1 tablet (total 40mg ) around 4PM EVERY OTHER DAY  75 tablet  3  . Multiple Vitamin (MULTIVITAMIN) tablet Take 1 tablet by mouth daily.        Marland Kitchen NEXIUM 20 MG capsule take 1 capsule by mouth once daily  30 capsule  2  . nitroGLYCERIN (NITROSTAT) 0.4 MG SL tablet Place 1 tablet (0.4 mg total) under the tongue every 5 (five) minutes as needed for chest pain.  25 tablet  3  . spironolactone (ALDACTONE) 25 MG tablet take 1 tablet by mouth once daily  30 tablet  6    Exam:  BP 135/85  Pulse 86  Temp(Src) 97.7 F (36.5 C) (Oral)  Resp 20  SpO2 97% Gen: Well NAD HEENT: EOMI,  MMM normal posterior pharynx Lungs: Normal work of breathing. CTABL Heart: RRR no MRG Abd: NABS, Soft. NT, ND Exts: Brisk capillary refill, warm and well perfused.   Patient tolerated crackers and water in the office today.  Assessment and Plan: 80 y.o. female with globus sensation versus foreign body. No obstruction. Continue PPI. Refer to gastroenterology.  Discussed warning signs or symptoms. Please see discharge instructions. Patient expresses understanding.    Gregor Hams, MD 02/20/13 [phone]

## 2013-02-27 ENCOUNTER — Emergency Department (INDEPENDENT_AMBULATORY_CARE_PROVIDER_SITE_OTHER)
Admission: EM | Admit: 2013-02-27 | Discharge: 2013-02-27 | Disposition: A | Discharge status: Home / Self Care | Payer: PRIVATE HEALTH INSURANCE | Source: Home / Self Care | Attending: Family Medicine | Admitting: Family Medicine

## 2013-02-27 ENCOUNTER — Encounter (HOSPITAL_COMMUNITY): Payer: Self-pay | Admitting: Emergency Medicine

## 2013-02-27 DIAGNOSIS — M5137 Other intervertebral disc degeneration, lumbosacral region: Secondary | ICD-10-CM

## 2013-02-27 DIAGNOSIS — M1610 Unilateral primary osteoarthritis, unspecified hip: Secondary | ICD-10-CM

## 2013-02-27 DIAGNOSIS — M51379 Other intervertebral disc degeneration, lumbosacral region without mention of lumbar back pain or lower extremity pain: Secondary | ICD-10-CM

## 2013-02-27 DIAGNOSIS — M169 Osteoarthritis of hip, unspecified: Secondary | ICD-10-CM

## 2013-02-27 DIAGNOSIS — M47817 Spondylosis without myelopathy or radiculopathy, lumbosacral region: Secondary | ICD-10-CM

## 2013-02-27 MED ORDER — TRAMADOL HCL 50 MG PO TABS: 50.0000 mg | ORAL_TABLET | Freq: Two times a day (BID) | ORAL | Status: DC | PRN

## 2013-02-27 MED ORDER — ONDANSETRON HCL 4 MG PO TABS: 4.0000 mg | ORAL_TABLET | Freq: Two times a day (BID) | ORAL | Status: DC

## 2013-02-27 NOTE — Discharge Instructions (Signed)

## 2013-02-27 NOTE — ED Provider Notes (Signed)
CSN: [id number]     Arrival date & time 02/27/13  1024 History   First MD Initiated Contact with Patient 02/27/13 1112     Chief Complaint  Patient presents with  . Hip Pain     (Consider location/radiation/quality/duration/timing/severity/associated sxs/prior Treatment) HPI Comments: Patient presents for acute exacerbation of chronic right hip pain. States she has had chronic discomfort in both hips and lower back for at least 3 years and is historically followed by [facility] for this condition. Last MRI in Spring of 2014 with "spine injection" at that time. Old records reviewed and previous radiographs consistent with degenerative changes at hips and LS spine. Patient states current episode of discomfort began on 02/23/2013 and was without any new injury. No GI or GU sx and no bowel or bladder incontinence or perineal anesthesia. No changes in strength or sensation of right lower extremity.   Patient is a 80 y.o. female presenting with hip pain. The history is provided by the patient and a relative.  Hip Pain    Past Medical History  Diagnosis Date  . Hypertension   . Atrial fibrillation   . Osteoarthritis   . GERD (gastroesophageal reflux disease)   . Long term current use of anticoagulant   . Chronic diastolic heart failure     Echo 02/2008: EF 55%, MAC, mild MR, moderate BAE  . Obesity   . Refusal of blood transfusions as patient is Jehovah's Witness   . Hx of cardiovascular stress test     Lexiscan Myoview (10/14):  Fixed inf defect, no ischemia, study not gated  . Hx of echocardiogram     Echo (11/14):  Mild LVH, EF 60-65%, mod to severe LAE, mod RVE, mild to mod reduced RVSF, mild RAE, PASP 38   Past Surgical History  Procedure Laterality Date  . Cholecystectomy    . Abdominal hysterectomy    . Tubal ligation    . Knee arthroscopy      bilat knees  . Carpal tunnel release     Family History  Problem Relation Age of Onset  . Depression Daughter   .  Asthma Mother   . Colon cancer Mother   . Diabetes      sibling  . Hypertension      sibling   History  Substance Use Topics  . Smoking status: Former Research scientist (life sciences)  . Smokeless tobacco: Not on file  . Alcohol Use: No   OB History   Grav Para Term Preterm Abortions TAB SAB Ect Mult Living                 Review of Systems  All other systems reviewed and are negative.      Allergies  Hydrocodone; Morphine; and Penicillins  Home Medications   Current Outpatient Rx  Name  Route  Sig  Dispense  Refill  . amLODipine-benazepril (LOTREL) 5-20 MG per capsule   Oral   Take 1 capsule by mouth daily.   90 capsule   3   . apixaban (ELIQUIS) 5 MG TABS tablet   Oral   Take 1 tablet (5 mg total) by mouth 2 (two) times daily.   60 tablet   11   . furosemide (LASIX) 40 MG tablet      1 and 1/2 tablets (total 60mg )  in the AM and 1 tablet (total 40mg ) around 4PM EVERY OTHER DAY   75 tablet   3   . spironolactone (ALDACTONE) 25 MG tablet  take 1 tablet by mouth once daily   30 tablet   6   . acetaminophen (TYLENOL 8 HOUR) 650 MG CR tablet   Oral   Take 1 tablet (650 mg total) by mouth every 8 (eight) hours as needed for pain.   30 tablet   3   . calcium-vitamin D (OSCAL WITH D 500-200) 500-200 MG-UNIT per tablet   Oral   Take 1 tablet by mouth daily.           . Cholecalciferol (VITAMIN D-3) 1000 UNITS CAPS   Oral   Take by mouth daily.         . clotrimazole-betamethasone (LOTRISONE) cream      APPLY A GENEROUS AMOUNT TO AFFECTED AREA TWICE DAILY   90 g   2   . fish oil-omega-3 fatty acids 1000 MG capsule   Oral   Take 2 g by mouth daily.         . Multiple Vitamin (MULTIVITAMIN) tablet   Oral   Take 1 tablet by mouth daily.           Marland Kitchen NEXIUM 20 MG capsule      take 1 capsule by mouth once daily   30 capsule   2   . nitroGLYCERIN (NITROSTAT) 0.4 MG SL tablet   Sublingual   Place 1 tablet (0.4 mg total) under the tongue every 5 (five)  minutes as needed for chest pain.   25 tablet   3    BP 131/86  Pulse 95  Temp(Src) 98.9 F (37.2 C) (Oral)  Resp 18  SpO2 100% Physical Exam  Vitals reviewed. Constitutional: She is oriented to person, place, and time. She appears well-developed and well-nourished. No distress.  +overweight  HENT:  Head: Normocephalic and atraumatic.  Eyes: Conjunctivae are normal.  Neck: Normal range of motion. Neck supple.  Cardiovascular: Normal rate.   Pulmonary/Chest: Effort normal.  Abdominal: Soft. Bowel sounds are normal. She exhibits no distension. There is no tenderness.  Musculoskeletal:       Right hip: She exhibits tenderness. She exhibits normal range of motion, normal strength, no swelling, no crepitus, no deformity and no laceration.       Legs: CSM exam of right LE normal. Gait normal.  Neurological: She is alert and oriented to person, place, and time.  Skin: Skin is warm and dry. No rash noted.  Psychiatric: She has a normal mood and affect. Her behavior is normal.    ED Course  Procedures (including critical care time) Labs Review Labs Reviewed - No data to display Imaging Review No results found.    MDM   Final diagnoses:  None   Will provide Rx for for Ultram for pain and advised patient and family to have her follow up  With [place].    [place], [place] 02/27/13 1146

## 2013-02-27 NOTE — ED Provider Notes (Signed)
Medical screening examination/treatment/procedure(s) were performed by resident physician or non-physician practitioner and as supervising physician I was immediately available for consultation/collaboration.   Pauline Good MD.   Billy Fischer, MD 02/27/13 [phone]

## 2013-03-05 ENCOUNTER — Ambulatory Visit (INDEPENDENT_AMBULATORY_CARE_PROVIDER_SITE_OTHER): Payer: PRIVATE HEALTH INSURANCE | Admitting: Family Medicine

## 2013-03-05 ENCOUNTER — Encounter: Payer: Self-pay | Admitting: Family Medicine

## 2013-03-05 VITALS — BP 137/87 | HR 80 | Temp 98.1°F | Ht 66.0 in | Wt 228.0 lb

## 2013-03-05 DIAGNOSIS — R319 Hematuria, unspecified: Secondary | ICD-10-CM

## 2013-03-05 LAB — POCT URINALYSIS DIPSTICK
Bilirubin, UA: NEGATIVE
Blood, UA: NEGATIVE
Glucose, UA: NEGATIVE
KETONES UA: NEGATIVE
Leukocytes, UA: NEGATIVE
Nitrite, UA: NEGATIVE
PH UA: 5
PROTEIN UA: NEGATIVE
SPEC GRAV UA: 1.02
UROBILINOGEN UA: 0.2

## 2013-03-05 NOTE — Progress Notes (Signed)
Subjective:     Patient ID: [name], female   DOB: 1930/10/06, 80 y.o.   MRN: [id number]  HPI 80 y.o. F here for complaints of pink when wiping after urinating 2x today. Changed from warfarin to eliquis 2.5 weeks ago. No blood in stool, no bleeding when changing dentures. No significant bruising. No pain with urination, no foul smells, no change in frequency or incontinence.   Pt recently seen in ED for globus sensation that has resolved. Right after leaving ED.  Pt with a flare in her right hip and has been taking tramadol and has had improvement. Pt reports significant improvement and able to walk at this time. Pt reports back to normal.  Review of Systems  as above.    Objective:   Physical Exam Filed Vitals:   03/05/13 1446  BP: 137/87  Pulse: 80  Temp: 98.1 F (36.7 C)   VSS NAD No abd pain, No suprapubic pain Hips bil FROM, NO pain, no TTP, no issues found No c/c. Stable edema improved from prior.    Assessment:     80 y.o. F here for ED f/u, conditions resolved. With pink urine, no evidence of additional bleeding.     Plan:     Globus sensation: resolved nothing to do Hip Pain: resolved, NTD  Pink with wiping: possibly related to eliquis but doubt given short time use and only new onset. UTI unlikely given reassuring UDip. Continue eliquis.   Fredrik Rigger, MD OB Fellow

## 2013-03-10 ENCOUNTER — Other Ambulatory Visit: Payer: Self-pay | Admitting: *Deleted

## 2013-03-10 DIAGNOSIS — K219 Gastro-esophageal reflux disease without esophagitis: Secondary | ICD-10-CM

## 2013-03-15 ENCOUNTER — Other Ambulatory Visit: Payer: Self-pay | Admitting: Family Medicine

## 2013-03-15 DIAGNOSIS — K219 Gastro-esophageal reflux disease without esophagitis: Secondary | ICD-10-CM

## 2013-03-15 MED ORDER — ESOMEPRAZOLE MAGNESIUM 20 MG PO CPDR: 20.0000 mg | DELAYED_RELEASE_CAPSULE | Freq: Every day | ORAL | Status: DC

## 2013-03-23 ENCOUNTER — Ambulatory Visit: Payer: PRIVATE HEALTH INSURANCE | Admitting: Cardiology

## 2013-04-19 ENCOUNTER — Other Ambulatory Visit: Payer: Self-pay

## 2013-04-19 DIAGNOSIS — Z1231 Encounter for screening mammogram for malignant neoplasm of breast: Secondary | ICD-10-CM

## 2013-04-21 ENCOUNTER — Ambulatory Visit (INDEPENDENT_AMBULATORY_CARE_PROVIDER_SITE_OTHER): Payer: PRIVATE HEALTH INSURANCE | Admitting: Cardiology

## 2013-04-21 ENCOUNTER — Encounter: Payer: Self-pay | Admitting: Cardiology

## 2013-04-21 VITALS — BP 145/89 | HR 63 | Ht 66.0 in | Wt 233.0 lb

## 2013-04-21 DIAGNOSIS — I4891 Unspecified atrial fibrillation: Secondary | ICD-10-CM

## 2013-04-21 DIAGNOSIS — I5032 Chronic diastolic (congestive) heart failure: Secondary | ICD-10-CM

## 2013-04-21 DIAGNOSIS — I10 Essential (primary) hypertension: Secondary | ICD-10-CM

## 2013-04-21 DIAGNOSIS — R0602 Shortness of breath: Secondary | ICD-10-CM

## 2013-04-21 LAB — BASIC METABOLIC PANEL
BUN: 12 mg/dL (ref 6–23)
CALCIUM: 9.8 mg/dL (ref 8.4–10.5)
CO2: 27 meq/L (ref 19–32)
CREATININE: 0.6 mg/dL (ref 0.4–1.2)
Chloride: 100 mEq/L (ref 96–112)
GFR: 133.62 mL/min (ref >60.00)
Glucose, Bld: 112 mg/dL — ABNORMAL HIGH (ref 70–99)
Potassium: 3.9 mEq/L (ref 3.5–5.1)
SODIUM: 136 meq/L (ref 135–145)

## 2013-04-21 LAB — CBC WITH DIFFERENTIAL/PLATELET
BASOS ABS: 0 10*3/uL (ref 0.0–0.1)
Basophils Relative: 0.2 % (ref 0.0–3.0)
EOS ABS: 0.1 10*3/uL (ref 0.0–0.7)
Eosinophils Relative: 1.3 % (ref 0.0–5.0)
HCT: 39.5 % (ref 36.0–46.0)
Hemoglobin: 12.9 g/dL (ref 12.0–15.0)
Lymphocytes Relative: 41.7 % (ref 12.0–46.0)
Lymphs Abs: 3.1 10*3/uL (ref 0.7–4.0)
MCHC: 32.6 g/dL (ref 30.0–36.0)
MCV: 89.4 fl (ref 78.0–100.0)
MONO ABS: 0.6 10*3/uL (ref 0.1–1.0)
Monocytes Relative: 7.4 % (ref 3.0–12.0)
NEUTROS PCT: 49.4 % (ref 43.0–77.0)
Neutro Abs: 3.7 10*3/uL (ref 1.4–7.7)
PLATELETS: 203 10*3/uL (ref 150.0–400.0)
RBC: 4.41 Mil/uL (ref 3.87–5.11)
RDW: 15.3 % — AB (ref 11.5–14.6)
WBC: 7.4 10*3/uL (ref 4.5–10.5)

## 2013-04-21 LAB — BRAIN NATRIURETIC PEPTIDE: Pro B Natriuretic peptide (BNP): 94 pg/mL (ref 0.0–100.0)

## 2013-04-21 NOTE — Patient Instructions (Signed)
Your physician recommends that you have lab work today--BMET/BNP/CBCd  Your physician wants you to follow-up in: 3 months with Dr Aundra Dubin. (July 2015). You will receive a reminder letter in the mail two months in advance. If you don't receive a letter, please call our office to schedule the follow-up appointment.

## 2013-04-22 NOTE — Progress Notes (Signed)
Patient ID: Bianca [name], female   DOB: 1930/01/29, 80 y.o.   MRN: [id number] PCP: Dr. Raeford Razor  80 yo with history of permanent atrial fibrillation and diastolic CHF presents for cardiology followup.    She lives alone.  She continues to drive and does her own shopping, etc.  She has lower extremity edema.  This has been better since she cut back on her amlodipine.  No chest pain.  She is short of breath after walking 100 feet currently, this is a bit improved.  She is short of breath making up bed.  No orthopnea, PND, falls, or syncope/lightheadedness. At last appointment, I thought she was volume overloaded and increased her Lasix.  Weight is down 3 lbs.  Less leg edema.   ECG: Atrial fibrillation at 75, LAFB, nonspecific T wave flattening   Labs (10/14): K 3.8, creatinine 0.7, BNP 96, HCT 39.7 Labs (1/15): K 3.7, creatinine 0.7, BNP 99, LDL 101, HDL 40  PMH: 1. Permanent atrial fibrillation: Holter monitor in the past showed bradycardia/pauses, so she is not on nodal blockers.  2. HTN 3. Chronic diastolic CHF: Echo (94/17) with EF 60-65%, mild LVH, moderately dilated RV with moderately decreased RV systolic function.  4. GERD 5. OA 6. Obesity 7. Lexiscan Cardiolite (10/14) with medium-sized inferior and inferoseptal fixed defect (old MI versus attenuation) and no ischemia.   SH: Widow, nonsmoker, lives in [place], [place].   FH: No premature CAD  Current Outpatient Prescriptions  Medication Sig Dispense Refill  . acetaminophen (TYLENOL 8 HOUR) 650 MG CR tablet Take 1 tablet (650 mg total) by mouth every 8 (eight) hours as needed for pain.  30 tablet  3  . amLODipine-benazepril (LOTREL) 5-20 MG per capsule Take 1 capsule by mouth daily.  90 capsule  3  . apixaban (ELIQUIS) 5 MG TABS tablet Take 1 tablet (5 mg total) by mouth 2 (two) times daily.  60 tablet  11  . calcium-vitamin D (OSCAL WITH D 500-200) 500-200 MG-UNIT per tablet Take 1 tablet by mouth daily.        .  Cholecalciferol (VITAMIN D-3) 1000 UNITS CAPS Take by mouth daily.      . clotrimazole-betamethasone (LOTRISONE) cream APPLY A GENEROUS AMOUNT TO AFFECTED AREA TWICE DAILY  90 g  2  . fish oil-omega-3 fatty acids 1000 MG capsule Take 2 g by mouth daily.      . furosemide (LASIX) 40 MG tablet 1 and 1/2 tablets (total 60mg )  in the AM and 1 tablet (total 40mg ) around 4PM EVERY OTHER DAY  75 tablet  3  . Multiple Vitamin (MULTIVITAMIN) tablet Take 1 tablet by mouth daily.        . nitroGLYCERIN (NITROSTAT) 0.4 MG SL tablet Place 1 tablet (0.4 mg total) under the tongue every 5 (five) minutes as needed for chest pain.  25 tablet  3  . ondansetron (ZOFRAN) 4 MG tablet Take 1 tablet (4 mg total) by mouth 2 (two) times daily. As needed for nausea  12 tablet  0  . spironolactone (ALDACTONE) 25 MG tablet take 1 tablet by mouth once daily  30 tablet  6  . traMADol (ULTRAM) 50 MG tablet Take 1 tablet (50 mg total) by mouth every 12 (twelve) hours as needed for moderate pain or severe pain.  15 tablet  0  . esomeprazole (NEXIUM) 20 MG capsule Take 1 capsule (20 mg total) by mouth daily.  30 capsule  2   No current facility-administered medications for  this visit.   BP 145/89  Pulse 63  Ht 5\' 6"  (1.676 m)  Wt 105.688 kg (233 lb)  BMI 37.63 kg/m2 General: NAD Neck: JVP 7 cm, no thyromegaly or thyroid nodule.  Lungs: Clear to auscultation bilaterally with normal respiratory effort. CV: Nondisplaced PMI.  Heart irregular S1/S2, no S3/S4, no murmur.  1+ ankle edema.  No carotid bruit.  Normal pedal pulses.  Abdomen: Soft, nontender, no hepatosplenomegaly, no distention.  Skin: Intact without lesions or rashes.  Neurologic: Alert and oriented x 3.  Psych: Normal affect. Extremities: No clubbing or cyanosis.   Assessment/Plan: 1. Chronic diastolic CHF: Volume status improved.  Stable NYHA class II-III symptoms. - Continue current Lasix, 60 mg qam and every other day 40 mg in the afternoon.  - BMET/BNP  today.  2. Permanent atrial fibrillation: Rate is controlled off nodal blockers (had bradycardia on nodal blockers).  She is on Eliquis now and seems to be tolerating it well.  Will check CBC.  3. Lexiscan Cardiolite in 10/14 showed inferior/inferoseptal fixed defect but no ischemia.  This could have been attenuation.  She has had no chest pain.  4. HTN: BP is controlled.   Larey Dresser 04/22/2013

## 2013-04-23 ENCOUNTER — Telehealth: Payer: Self-pay | Admitting: Cardiology

## 2013-04-23 NOTE — Telephone Encounter (Signed)
New Message:  Pt states she is returning a call to the nurse

## 2013-04-23 NOTE — Telephone Encounter (Signed)
NA

## 2013-04-26 NOTE — Telephone Encounter (Signed)
Spoke with patient about recent lab results 

## 2013-05-10 ENCOUNTER — Ambulatory Visit
Admission: RE | Admit: 2013-05-10 | Discharge: 2013-05-10 | Disposition: A | Discharge status: Home / Self Care | Payer: PRIVATE HEALTH INSURANCE | Source: Ambulatory Visit

## 2013-05-10 ENCOUNTER — Encounter (INDEPENDENT_AMBULATORY_CARE_PROVIDER_SITE_OTHER): Payer: Self-pay

## 2013-05-10 DIAGNOSIS — Z1231 Encounter for screening mammogram for malignant neoplasm of breast: Secondary | ICD-10-CM

## 2013-06-03 ENCOUNTER — Ambulatory Visit (INDEPENDENT_AMBULATORY_CARE_PROVIDER_SITE_OTHER): Payer: PRIVATE HEALTH INSURANCE | Admitting: Family Medicine

## 2013-06-03 ENCOUNTER — Encounter: Payer: Self-pay | Admitting: Family Medicine

## 2013-06-03 VITALS — BP 129/64 | HR 84 | Temp 98.2°F | Ht 66.0 in | Wt 230.0 lb

## 2013-06-03 DIAGNOSIS — R7309 Other abnormal glucose: Secondary | ICD-10-CM

## 2013-06-03 DIAGNOSIS — E739 Lactose intolerance, unspecified: Secondary | ICD-10-CM

## 2013-06-03 DIAGNOSIS — M1990 Unspecified osteoarthritis, unspecified site: Secondary | ICD-10-CM

## 2013-06-03 DIAGNOSIS — I10 Essential (primary) hypertension: Secondary | ICD-10-CM

## 2013-06-03 DIAGNOSIS — R7303 Prediabetes: Secondary | ICD-10-CM

## 2013-06-03 DIAGNOSIS — M129 Arthropathy, unspecified: Secondary | ICD-10-CM

## 2013-06-03 DIAGNOSIS — R52 Pain, unspecified: Secondary | ICD-10-CM

## 2013-06-03 LAB — POCT GLYCOSYLATED HEMOGLOBIN (HGB A1C): Hemoglobin A1C: 5.6

## 2013-06-03 MED ORDER — GABAPENTIN 100 MG PO CAPS: 100.0000 mg | ORAL_CAPSULE | Freq: Three times a day (TID) | ORAL | Status: DC

## 2013-06-03 NOTE — Patient Instructions (Signed)
Thank you for coming in,   I will call you with the results of today lab draw.   I will start a low dose of gabapentin to help with your pain. It will make you sleepy so make sure that you are not operating a vehicle or other machinery when you first take it until you know how it affects you.   Please call me about your blood pressure. If you feel that it is elevated, then schedule a nurse visit to check your blood pressure and we may make changes to your medications.    Please feel free to call with any questions or concerns at any time, at [phone]. --Dr. Raeford Razor.  Gabapentin capsules or tablets What is this medicine? GABAPENTIN (GA ba pen tin) is used to control partial seizures in adults with epilepsy. It is also used to treat certain types of nerve pain. This medicine may be used for other purposes; ask your health care provider or pharmacist if you have questions. COMMON BRAND NAME(S): Orpha Bur , Neurontin What should I tell my health care provider before I take this medicine? They need to know if you have any of these conditions: -kidney disease -suicidal thoughts, plans, or attempt; a previous suicide attempt by you or a family member -an unusual or allergic reaction to gabapentin, other medicines, foods, dyes, or preservatives -pregnant or trying to get pregnant -breast-feeding How should I use this medicine? Take this medicine by mouth with a glass of water. Follow the directions on the prescription label. You can take it with or without food. If it upsets your stomach, take it with food.Take your medicine at regular intervals. Do not take it more often than directed. Do not stop taking except on your doctor's advice. If you are directed to break the 600 or 800 mg tablets in half as part of your dose, the extra half tablet should be used for the next dose. If you have not used the extra half tablet within 28 days, it should be thrown away. A special MedGuide will be given to you  by the pharmacist with each prescription and refill. Be sure to read this information carefully each time. Talk to your pediatrician regarding the use of this medicine in children. Special care may be needed. Overdosage: If you think you have taken too much of this medicine contact a poison control center or emergency room at once. NOTE: This medicine is only for you. Do not share this medicine with others. What if I miss a dose? If you miss a dose, take it as soon as you can. If it is almost time for your next dose, take only that dose. Do not take double or extra doses. What may interact with this medicine? Do not take this medicine with any of the following medications: -other gabapentin products This medicine may also interact with the following medications: -alcohol -antacids -antihistamines for allergy, cough and cold -certain medicines for anxiety or sleep -certain medicines for depression or psychotic disturbances -homatropine; hydrocodone -naproxen -narcotic medicines (opiates) for pain -phenothiazines like chlorpromazine, mesoridazine, prochlorperazine, thioridazine This list may not describe all possible interactions. Give your health care provider a list of all the medicines, herbs, non-prescription drugs, or dietary supplements you use. Also tell them if you smoke, drink alcohol, or use illegal drugs. Some items may interact with your medicine. What should I watch for while using this medicine? Visit your doctor or health care professional for regular checks on your progress. You may want to  keep a record at home of how you feel your condition is responding to treatment. You may want to share this information with your doctor or health care professional at each visit. You should contact your doctor or health care professional if your seizures get worse or if you have any new types of seizures. Do not stop taking this medicine or any of your seizure medicines unless instructed by  your doctor or health care professional. Stopping your medicine suddenly can increase your seizures or their severity. Wear a medical identification bracelet or chain if you are taking this medicine for seizures, and carry a card that lists all your medications. You may get drowsy, dizzy, or have blurred vision. Do not drive, use machinery, or do anything that needs mental alertness until you know how this medicine affects you. To reduce dizzy or fainting spells, do not sit or stand up quickly, especially if you are an older patient. Alcohol can increase drowsiness and dizziness. Avoid alcoholic drinks. Your mouth may get dry. Chewing sugarless gum or sucking hard candy, and drinking plenty of water will help. The use of this medicine may increase the chance of suicidal thoughts or actions. Pay special attention to how you are responding while on this medicine. Any worsening of mood, or thoughts of suicide or dying should be reported to your health care professional right away. Women who become pregnant while using this medicine may enroll in the [place] Pregnancy Registry by calling [phone]. This registry collects information about the safety of antiepileptic drug use during pregnancy. What side effects may I notice from receiving this medicine? Side effects that you should report to your doctor or health care professional as soon as possible: -allergic reactions like skin rash, itching or hives, swelling of the face, lips, or tongue -worsening of mood, thoughts or actions of suicide or dying Side effects that usually do not require medical attention (report to your doctor or health care professional if they continue or are bothersome): -constipation -difficulty walking or controlling muscle movements -dizziness -nausea -slurred speech -tiredness -tremors -weight gain This list may not describe all possible side effects. Call your doctor for medical advice about  side effects. You may report side effects to FDA at 1-800-FDA-1088. Where should I keep my medicine? Keep out of reach of children. Store at room temperature between 15 and 30 degrees C (59 and 86 degrees F). Throw away any unused medicine after the expiration date. NOTE: This sheet is a summary. It may not cover all possible information. If you have questions about this medicine, talk to your doctor, pharmacist, or health care provider.  2014, Elsevier/Gold Standard. (2012-09-03 09:12:48)

## 2013-06-04 ENCOUNTER — Encounter: Payer: Self-pay | Admitting: Family Medicine

## 2013-06-04 ENCOUNTER — Telehealth: Payer: Self-pay | Admitting: *Deleted

## 2013-06-04 NOTE — Telephone Encounter (Signed)
Message copied by Corinna Capra on Fri Jun 04, 2013  8:46 AM ------      Message from: Clearance Coots E      Created: Thu Jun 03, 2013  4:48 PM       Please call patient and inform her that her Hgb A1c was well below normal and she doesn't have diabetes. Thank you. ------

## 2013-06-04 NOTE — Telephone Encounter (Signed)
Relayed message,patient voiced understanding.Bianca [name]  

## 2013-06-04 NOTE — Assessment & Plan Note (Signed)
Hgb 5.5 within normal limits and diet controlled. No further recommendations.

## 2013-06-04 NOTE — Progress Notes (Signed)
Subjective:    Patient ID: Bianca [name], female    DOB: 1930-06-22, 80 y.o.   MRN: [id number]  HPI Bianca [name] is here for f/u of HTN, pre-diabetes and ongoing pain.   HTN Disease Monitoring: none Home BP Monitoring none Chest pain- no     Dyspnea- no Medications:amlodipine/benzapril 5/20 Compliance-  yes. Lightheadedness-  no  Edema-  Yes but better than previously.  Reports to taking an extra pill every other day because she has a headache. No one prompted her to do this. She started doing this herself. Recently started on eliquis for her Afib.   She is worried that she may be developing diabetes. She has several family members with diabetes. Most recent Hgb A1c 6.1. She doesn't take any medication and diet controlled.   She is complaining of pain in multiple sites. She would like to be tested for fibromyalgia. She has pain in her knees and ankles but has a history of osteoarthritis. Pain seems to be ongoing. Reports at pain during day and night. Pain in several different spots. Patient has been taking NSAIDS for pain.    Current Outpatient Prescriptions on File Prior to Visit  Medication Sig Dispense Refill  . acetaminophen (TYLENOL 8 HOUR) 650 MG CR tablet Take 1 tablet (650 mg total) by mouth every 8 (eight) hours as needed for pain.  30 tablet  3  . amLODipine-benazepril (LOTREL) 5-20 MG per capsule Take 1 capsule by mouth daily.  90 capsule  3  . apixaban (ELIQUIS) 5 MG TABS tablet Take 1 tablet (5 mg total) by mouth 2 (two) times daily.  60 tablet  11  . calcium-vitamin D (OSCAL WITH D 500-200) 500-200 MG-UNIT per tablet Take 1 tablet by mouth daily.        . Cholecalciferol (VITAMIN D-3) 1000 UNITS CAPS Take by mouth daily.      . clotrimazole-betamethasone (LOTRISONE) cream APPLY A GENEROUS AMOUNT TO AFFECTED AREA TWICE DAILY  90 g  2  . esomeprazole (NEXIUM) 20 MG capsule Take 1 capsule (20 mg total) by mouth daily.  30 capsule  2  . fish oil-omega-3 fatty acids 1000 MG  capsule Take 2 g by mouth daily.      . furosemide (LASIX) 40 MG tablet 1 and 1/2 tablets (total 60mg )  in the AM and 1 tablet (total 40mg ) around 4PM EVERY OTHER DAY  75 tablet  3  . Multiple Vitamin (MULTIVITAMIN) tablet Take 1 tablet by mouth daily.        . nitroGLYCERIN (NITROSTAT) 0.4 MG SL tablet Place 1 tablet (0.4 mg total) under the tongue every 5 (five) minutes as needed for chest pain.  25 tablet  3  . ondansetron (ZOFRAN) 4 MG tablet Take 1 tablet (4 mg total) by mouth 2 (two) times daily. As needed for nausea  12 tablet  0  . spironolactone (ALDACTONE) 25 MG tablet take 1 tablet by mouth once daily  30 tablet  6   No current facility-administered medications on file prior to visit.    Review of Systems See HPI    Objective:   Physical Exam BP 129/64  Pulse 84  Temp(Src) 98.2 F (36.8 C) (Oral)  Ht 5\' 6"  (1.676 m)  Wt 230 lb (104.327 kg)  BMI 37.14 kg/m2 Gen: NAD, alert, cooperative with exam, well-appearing, African [place] female.  CV: irregular rhythm, regular rate, good S1/S2, no murmur, no edema, capillary refill brisk  Resp: CTABL, no wheezes, non-labored MSK: 5/5  strength in upper and lower extremities. B/l swelling in ankles of chronic and improved compared to last appt., pain in knees but ligaments intact.     Assessment & Plan:

## 2013-06-04 NOTE — Assessment & Plan Note (Signed)
History of multiple sites of arthritis. Has taken tylenol and NSAID in the past. She would like to be evaluated for fibromyalgia. Does have a history of Depression. Sites of pain would more correlate with arthritis than fibromyalgia. No labs at this time.  - started Gabapentin 100 mg TID - f/u in 3 months or sooner if not working.

## 2013-06-04 NOTE — Assessment & Plan Note (Signed)
BP controlled. She has been taking extra doses of her medications. She started doing this when she gets a headache. Advised to stop this.  - continue current meds  - if having HA, then schedule a nurse visit to check blood pressure.  - f/u as needed

## 2013-06-07 ENCOUNTER — Other Ambulatory Visit: Payer: Self-pay | Admitting: Family Medicine

## 2013-06-10 ENCOUNTER — Ambulatory Visit (INDEPENDENT_AMBULATORY_CARE_PROVIDER_SITE_OTHER): Payer: PRIVATE HEALTH INSURANCE | Admitting: *Deleted

## 2013-06-10 VITALS — BP 144/82 | HR 85

## 2013-06-10 DIAGNOSIS — Z136 Encounter for screening for cardiovascular disorders: Secondary | ICD-10-CM

## 2013-06-10 DIAGNOSIS — Z0130 Encounter for examination of blood pressure without abnormal findings: Secondary | ICD-10-CM

## 2013-06-10 NOTE — Progress Notes (Signed)
   Pt in nurse clinic for blood pressure check.  Blood pressure 144/82 manually and hear rate 85.  Pt denies any symptoms today.  Pt took medication as prescribed.  Will forward to  PCP.  Derl Barrow, RN

## 2013-06-15 ENCOUNTER — Encounter: Payer: Self-pay | Admitting: Clinical

## 2013-06-15 NOTE — Progress Notes (Signed)
CSW has faxed PCS form to [employer]. Agency to contact pt for a home visit to determine if pt qualifies for services.  Hunt Oris, MSW, [place]

## 2013-07-08 ENCOUNTER — Encounter: Payer: Self-pay | Admitting: Family Medicine

## 2013-07-08 NOTE — Progress Notes (Unsigned)
Patient is needing a refill of Clotrimazone Cream.  She uses [facility] on [address].

## 2013-07-08 NOTE — Progress Notes (Signed)
Patient dropped off form to be filled out for handicapped placard.  Please call her when completed. °

## 2013-07-09 ENCOUNTER — Other Ambulatory Visit: Payer: Self-pay | Admitting: Family Medicine

## 2013-07-09 DIAGNOSIS — R21 Rash and other nonspecific skin eruption: Secondary | ICD-10-CM

## 2013-07-09 DIAGNOSIS — I10 Essential (primary) hypertension: Secondary | ICD-10-CM

## 2013-07-15 NOTE — Telephone Encounter (Signed)
Patient still waiting for her rx for her cream to be sent to pharmacy and also need to have the handicap placard completed by provider.  She has been waiting since last month

## 2013-07-20 ENCOUNTER — Telehealth: Payer: Self-pay | Admitting: Family Medicine

## 2013-07-20 NOTE — Telephone Encounter (Signed)
Pt called to check the status of her paperwork she dropped off and her prescription refill jw.

## 2013-07-21 NOTE — Telephone Encounter (Signed)
Dr Raeford Razor do you have form?please advise.Thank you.Shawanna Zanders, Lewie Loron

## 2013-07-21 NOTE — Telephone Encounter (Signed)
Patient form completed. Placed in [place] box.   Rosemarie Ax, MD PGY-2, [facility] 07/21/2013, 4:53 PM

## 2013-07-22 NOTE — Telephone Encounter (Signed)
Pt informed that disability parking placard is completed and ready for pick up.  Derl Barrow, RN

## 2013-07-28 MED ORDER — CLOTRIMAZOLE-BETAMETHASONE 1-0.05 % EX CREA: TOPICAL_CREAM | CUTANEOUS | Status: DC

## 2013-07-28 NOTE — Telephone Encounter (Signed)
Refilled lotrisone and amlodipine-ACEi.   Rosemarie Ax, MD PGY-2, [facility] 07/28/2013, 4:32 PM

## 2013-08-20 ENCOUNTER — Encounter: Payer: Self-pay | Admitting: *Deleted

## 2013-09-01 ENCOUNTER — Ambulatory Visit (INDEPENDENT_AMBULATORY_CARE_PROVIDER_SITE_OTHER): Payer: PRIVATE HEALTH INSURANCE | Admitting: Cardiology

## 2013-09-01 ENCOUNTER — Encounter: Payer: Self-pay | Admitting: Cardiology

## 2013-09-01 VITALS — BP 140/80 | HR 78 | Ht 66.0 in | Wt 239.0 lb

## 2013-09-01 DIAGNOSIS — I4819 Other persistent atrial fibrillation: Secondary | ICD-10-CM

## 2013-09-01 DIAGNOSIS — R0602 Shortness of breath: Secondary | ICD-10-CM

## 2013-09-01 DIAGNOSIS — I10 Essential (primary) hypertension: Secondary | ICD-10-CM

## 2013-09-01 DIAGNOSIS — I5032 Chronic diastolic (congestive) heart failure: Secondary | ICD-10-CM

## 2013-09-01 DIAGNOSIS — I4891 Unspecified atrial fibrillation: Secondary | ICD-10-CM

## 2013-09-01 LAB — CBC WITH DIFFERENTIAL/PLATELET
Basophils Absolute: 0 10*3/uL (ref 0.0–0.1)
Basophils Relative: 0.6 % (ref 0.0–3.0)
EOS PCT: 1.2 % (ref 0.0–5.0)
Eosinophils Absolute: 0.1 10*3/uL (ref 0.0–0.7)
HCT: 38.2 % (ref 36.0–46.0)
HEMOGLOBIN: 12.2 g/dL (ref 12.0–15.0)
Lymphocytes Relative: 41.3 % (ref 12.0–46.0)
Lymphs Abs: 2.5 10*3/uL (ref 0.7–4.0)
MCHC: 31.8 g/dL (ref 30.0–36.0)
MCV: 89.6 fl (ref 78.0–100.0)
MONOS PCT: 7.3 % (ref 3.0–12.0)
Monocytes Absolute: 0.4 10*3/uL (ref 0.1–1.0)
Neutro Abs: 3 10*3/uL (ref 1.4–7.7)
Neutrophils Relative %: 49.6 % (ref 43.0–77.0)
PLATELETS: 179 10*3/uL (ref 150.0–400.0)
RBC: 4.26 Mil/uL (ref 3.87–5.11)
RDW: 15.1 % (ref 11.5–15.5)
WBC: 6.1 10*3/uL (ref 4.0–10.5)

## 2013-09-01 LAB — BASIC METABOLIC PANEL
BUN: 16 mg/dL (ref 6–23)
CHLORIDE: 103 meq/L (ref 96–112)
CO2: 31 mEq/L (ref 19–32)
CREATININE: 0.7 mg/dL (ref 0.4–1.2)
Calcium: 9.3 mg/dL (ref 8.4–10.5)
GFR: 108.54 mL/min (ref >60.00)
Glucose, Bld: 113 mg/dL — ABNORMAL HIGH (ref 70–99)
POTASSIUM: 3.7 meq/L (ref 3.5–5.1)
SODIUM: 141 meq/L (ref 135–145)

## 2013-09-01 LAB — BRAIN NATRIURETIC PEPTIDE: PRO B NATRI PEPTIDE: 167 pg/mL — AB (ref 0.0–100.0)

## 2013-09-01 MED ORDER — FUROSEMIDE 40 MG PO TABS: ORAL_TABLET | ORAL | Status: DC

## 2013-09-01 MED ORDER — SPIRONOLACTONE 25 MG PO TABS: ORAL_TABLET | ORAL | Status: DC

## 2013-09-01 NOTE — Progress Notes (Signed)
Patient ID: Bianca [name], female   DOB: 1930-04-18, 80 y.o.   MRN: [id number] PCP: Dr. Raeford Razor  80 yo with history of permanent atrial fibrillation and diastolic CHF presents for cardiology followup.    She lives alone.  She continues to drive and does her own shopping, etc.  She has lower extremity edema.  This has been better since she cut back on her amlodipine to 5 mg from 10 mg.  No chest pain.  She is short of breath after walking about 100 feet currently.  She is short of breath making up bed or walking up steps.  No orthopnea, PND, falls, or syncope/lightheadedness. Weight is up 6 lbs.     ECG: Atrial fibrillation at 80, septal Qs  Labs (10/14): K 3.8, creatinine 0.7, BNP 96, HCT 39.7 Labs (1/15): K 3.7, creatinine 0.7, BNP 99, LDL 101, HDL 40 Labs (4/15): K 3.9, creatinine 0.6, BNP 94, HCT 39.5  PMH: 1. Permanent atrial fibrillation: Holter monitor in the past showed bradycardia/pauses, so she is not on nodal blockers.  2. HTN 3. Chronic diastolic CHF: Echo (78/58) with EF 60-65%, mild LVH, moderately dilated RV with moderately decreased RV systolic function.  4. GERD 5. OA 6. Obesity 7. Lexiscan Cardiolite (10/14) with medium-sized inferior and inferoseptal fixed defect (old MI versus attenuation) and no ischemia.   SH: Widow, nonsmoker, lives in [place], [place].   FH: No premature CAD  ROS: All systems reviewed and negative except as per HPI.   Current Outpatient Prescriptions  Medication Sig Dispense Refill  . acetaminophen (TYLENOL 8 HOUR) 650 MG CR tablet Take 1 tablet (650 mg total) by mouth every 8 (eight) hours as needed for pain.  30 tablet  3  . amLODipine-benazepril (LOTREL) 5-10 MG per capsule Take 1 capsule by mouth daily.      Marland Kitchen apixaban (ELIQUIS) 5 MG TABS tablet Take 1 tablet (5 mg total) by mouth 2 (two) times daily.  60 tablet  11  . calcium-vitamin D (OSCAL WITH D 500-200) 500-200 MG-UNIT per tablet Take 1 tablet by mouth daily.        .  Cholecalciferol (VITAMIN D-3) 1000 UNITS CAPS Take by mouth daily.      . clotrimazole-betamethasone (LOTRISONE) cream APPLY A GENEROUS AMOUNT TO AFFECTED AREA TWICE DAILY  90 g  2  . esomeprazole (NEXIUM) 20 MG capsule Take 1 capsule (20 mg total) by mouth daily.  30 capsule  2  . fish oil-omega-3 fatty acids 1000 MG capsule Take 2 g by mouth daily.      Marland Kitchen gabapentin (NEURONTIN) 100 MG capsule Take 1 capsule (100 mg total) by mouth 3 (three) times daily.  90 capsule  0  . Multiple Vitamin (MULTIVITAMIN) tablet Take 1 tablet by mouth daily.        . nitroGLYCERIN (NITROSTAT) 0.4 MG SL tablet Place 1 tablet (0.4 mg total) under the tongue every 5 (five) minutes as needed for chest pain.  25 tablet  3  . ondansetron (ZOFRAN) 4 MG tablet Take 1 tablet (4 mg total) by mouth 2 (two) times daily. As needed for nausea  12 tablet  0  . spironolactone (ALDACTONE) 25 MG tablet take 1 tablet by mouth once daily  30 tablet  4  . furosemide (LASIX) 40 MG tablet 1 and 1/2 tablets (60mg ) in the AM and 1 tablet (40mg ) around 4PM every day  90 tablet  4   No current facility-administered medications for this visit.   BP  140/80  Pulse 78  Ht 5\' 6"  (1.676 m)  Wt 108.41 kg (239 lb)  BMI 38.59 kg/m2 General: NAD Neck: JVP 8 cm with HJR, no thyromegaly or thyroid nodule.  Lungs: Clear to auscultation bilaterally with normal respiratory effort. CV: Nondisplaced PMI.  Heart irregular S1/S2, no S3/S4, no murmur.  1+ ankle edema.  No carotid bruit.  Normal pedal pulses.  Abdomen: Soft, nontender, no hepatosplenomegaly, no distention.  Skin: Intact without lesions or rashes.  Neurologic: Alert and oriented x 3.  Psych: Normal affect. Extremities: No clubbing or cyanosis.   Assessment/Plan: 1. Chronic diastolic CHF: Patient does look volume overloaded today and weight is up 6 lbs.   - Increase Lasix to 60 qam, 40 qpm.   - BMET/BNP today then repeat in 2 wks.  2. Permanent atrial fibrillation: Rate is controlled  off nodal blockers (had bradycardia on nodal blockers).  She is on Eliquis now and seems to be tolerating it well.  Will check CBC.  3. HTN: BP is controlled.   Loralie Champagne 09/01/2013

## 2013-09-01 NOTE — Patient Instructions (Addendum)
Increase lasix to 60mg  in the AM and 40mg  about 4PM. This will be 1 and 1/2 of a 40mg  tablet in the AM and 1 of a 40mg  tablet about 4PM.  Your physician recommends that you return for lab work today--BMET/BNP/CBCd.  Your physician recommends that you return for lab work in: 2 weeks--BMET.  Your physician recommends that you schedule a follow-up appointment in: 3 months with Dr Aundra Dubin.

## 2013-09-15 ENCOUNTER — Other Ambulatory Visit (INDEPENDENT_AMBULATORY_CARE_PROVIDER_SITE_OTHER): Payer: PRIVATE HEALTH INSURANCE

## 2013-09-15 DIAGNOSIS — I4819 Other persistent atrial fibrillation: Secondary | ICD-10-CM

## 2013-09-15 DIAGNOSIS — I4891 Unspecified atrial fibrillation: Secondary | ICD-10-CM

## 2013-09-15 DIAGNOSIS — I10 Essential (primary) hypertension: Secondary | ICD-10-CM

## 2013-09-15 DIAGNOSIS — I5032 Chronic diastolic (congestive) heart failure: Secondary | ICD-10-CM

## 2013-09-15 LAB — BASIC METABOLIC PANEL
BUN: 19 mg/dL (ref 6–23)
CALCIUM: 9.5 mg/dL (ref 8.4–10.5)
CO2: 29 meq/L (ref 19–32)
Chloride: 103 mEq/L (ref 96–112)
Creatinine, Ser: 0.7 mg/dL (ref 0.4–1.2)
GFR: 112.39 mL/min (ref >60.00)
Glucose, Bld: 109 mg/dL — ABNORMAL HIGH (ref 70–99)
Potassium: 4.2 mEq/L (ref 3.5–5.1)
SODIUM: 141 meq/L (ref 135–145)

## 2013-10-22 ENCOUNTER — Ambulatory Visit: Payer: Self-pay | Admitting: Cardiology

## 2013-10-22 DIAGNOSIS — I4891 Unspecified atrial fibrillation: Secondary | ICD-10-CM

## 2013-10-29 ENCOUNTER — Encounter: Payer: Self-pay | Admitting: Family Medicine

## 2013-10-29 ENCOUNTER — Ambulatory Visit (INDEPENDENT_AMBULATORY_CARE_PROVIDER_SITE_OTHER): Payer: PRIVATE HEALTH INSURANCE | Admitting: Family Medicine

## 2013-10-29 VITALS — BP 144/94 | HR 93 | Temp 97.8°F | Ht 66.0 in | Wt 238.1 lb

## 2013-10-29 DIAGNOSIS — IMO0002 Reserved for concepts with insufficient information to code with codable children: Secondary | ICD-10-CM

## 2013-10-29 DIAGNOSIS — M179 Osteoarthritis of knee, unspecified: Secondary | ICD-10-CM

## 2013-10-29 DIAGNOSIS — M25569 Pain in unspecified knee: Secondary | ICD-10-CM

## 2013-10-29 DIAGNOSIS — M1710 Unilateral primary osteoarthritis, unspecified knee: Secondary | ICD-10-CM

## 2013-10-29 NOTE — Patient Instructions (Signed)
Thank you for coming in,   Please call me if you would like for me to send a pain medication until you can be seen at [facility].    Please feel free to call with any questions or concerns at any time, at [phone]. --Dr. Raeford Razor

## 2013-10-30 NOTE — Assessment & Plan Note (Signed)
Pain most likely related to osteoarthritis. No signs of infection. Reports receiving suparz injections about 4 years ago that helped significantly.  Tylenol only mildly helps. Will hold off on x-rays as the ortho office will get these.  - continue tylenol; can also use naproxen if it helps - can use gabapentin at night but if no tolerating can stop - referral to [facility] for consideration of another set of supartz injections.  - she didn't want any injections today into her knees  - requesting a walker  - referral to PT made for evaluation

## 2013-10-30 NOTE — Progress Notes (Signed)
Subjective:    Patient ID: Bianca [name], female    DOB: 06-Mar-1930, 80 y.o.   MRN: [id number]  HPI  Bianca [name] is here for b/l knee pain. History of osteoarthritis. S/p arthroscopy of b/l knees. Received supartz injection at [facility] about 4-5 years ago.   The pain in her knees is similar to what she's had before. The pain occurs with activity. Relieved with rest. It wakes her from sleep if she hits her legs. Tried gabapentin but makes her weak. The pain radiates down her anterior legs. She doesn't want any surgery on her knees. She has tried mobic, naproxen and tylenol for pain. They only minimally help. She denies any numbness, weakness or tingling. She is limited in the distance that she can walk due to the pain.    Current Outpatient Prescriptions on File Prior to Visit  Medication Sig Dispense Refill  . acetaminophen (TYLENOL 8 HOUR) 650 MG CR tablet Take 1 tablet (650 mg total) by mouth every 8 (eight) hours as needed for pain.  30 tablet  3  . amLODipine-benazepril (LOTREL) 5-10 MG per capsule Take 1 capsule by mouth daily.      Marland Kitchen apixaban (ELIQUIS) 5 MG TABS tablet Take 1 tablet (5 mg total) by mouth 2 (two) times daily.  60 tablet  11  . calcium-vitamin D (OSCAL WITH D 500-200) 500-200 MG-UNIT per tablet Take 1 tablet by mouth daily.        . Cholecalciferol (VITAMIN D-3) 1000 UNITS CAPS Take by mouth daily.      . clotrimazole-betamethasone (LOTRISONE) cream APPLY A GENEROUS AMOUNT TO AFFECTED AREA TWICE DAILY  90 g  2  . esomeprazole (NEXIUM) 20 MG capsule Take 1 capsule (20 mg total) by mouth daily.  30 capsule  2  . fish oil-omega-3 fatty acids 1000 MG capsule Take 2 g by mouth daily.      . furosemide (LASIX) 40 MG tablet 1 and 1/2 tablets (60mg ) in the AM and 1 tablet (40mg ) around 4PM every day  90 tablet  4  . gabapentin (NEURONTIN) 100 MG capsule Take 1 capsule (100 mg total) by mouth 3 (three) times daily.  90 capsule  0  . Multiple Vitamin (MULTIVITAMIN) tablet  Take 1 tablet by mouth daily.        . nitroGLYCERIN (NITROSTAT) 0.4 MG SL tablet Place 1 tablet (0.4 mg total) under the tongue every 5 (five) minutes as needed for chest pain.  25 tablet  3  . ondansetron (ZOFRAN) 4 MG tablet Take 1 tablet (4 mg total) by mouth 2 (two) times daily. As needed for nausea  12 tablet  0  . spironolactone (ALDACTONE) 25 MG tablet take 1 tablet by mouth once daily  30 tablet  4   No current facility-administered medications on file prior to visit.   Review of Systems See HPI     Objective:   Physical Exam BP 144/94  Pulse 93  Temp(Src) 97.8 F (36.6 C) (Oral)  Ht 5\' 6"  (1.676 m)  Wt 238 lb 1.6 oz (108.001 kg)  BMI 38.45 kg/m2 General: well appearing, elderly female, NAD Knee Exam:  Laterality: b/l  Appearance: no overlying erythema, warmth or ecchymosis   Edema: mild over b/l  anterior knees Tenderness: yes  lateral, medial joint line  Range of Motion: Passive Extension: normal Flexion:normal Active Extension: normal Flexion: normal Laxity: none Maneuvers: Lachman's: neg b/l  McMurray's: neg b/l  Posterior Drawer: neg b/l  Patellar Compression: neg  b/l  Strength:  Quadricep: 5/5 Hamstring: 5/5 Gait: antalgic  Neurovascularly intact b/l         Assessment & Plan:

## 2013-11-10 ENCOUNTER — Ambulatory Visit: Payer: PRIVATE HEALTH INSURANCE

## 2013-11-19 ENCOUNTER — Ambulatory Visit (INDEPENDENT_AMBULATORY_CARE_PROVIDER_SITE_OTHER): Payer: PRIVATE HEALTH INSURANCE | Admitting: Cardiology

## 2013-11-19 ENCOUNTER — Encounter: Payer: Self-pay | Admitting: Cardiology

## 2013-11-19 VITALS — BP 126/90 | HR 85 | Ht 66.0 in | Wt 237.0 lb

## 2013-11-19 DIAGNOSIS — I4819 Other persistent atrial fibrillation: Secondary | ICD-10-CM

## 2013-11-19 DIAGNOSIS — I481 Persistent atrial fibrillation: Secondary | ICD-10-CM

## 2013-11-19 DIAGNOSIS — I5032 Chronic diastolic (congestive) heart failure: Secondary | ICD-10-CM

## 2013-11-19 DIAGNOSIS — I10 Essential (primary) hypertension: Secondary | ICD-10-CM

## 2013-11-19 LAB — BASIC METABOLIC PANEL
BUN: 18 mg/dL (ref 6–23)
CO2: 26 mEq/L (ref 19–32)
CREATININE: 0.7 mg/dL (ref 0.4–1.2)
Calcium: 10 mg/dL (ref 8.4–10.5)
Chloride: 101 mEq/L (ref 96–112)
GFR: 112.34 mL/min (ref >60.00)
Glucose, Bld: 104 mg/dL — ABNORMAL HIGH (ref 70–99)
Potassium: 3.9 mEq/L (ref 3.5–5.1)
Sodium: 139 mEq/L (ref 135–145)

## 2013-11-19 NOTE — Patient Instructions (Signed)
Your physician recommends that you have  lab work today--BMET.   Your physician recommends that you schedule a follow-up appointment in: 4 months with Dr Aundra Dubin. (March 2016).

## 2013-11-21 NOTE — Progress Notes (Signed)
Patient ID: Bianca [name], female   DOB: 05-Dec-1930, 80 y.o.   MRN: [id number] PCP: Dr. Raeford Razor  80 yo with history of permanent atrial fibrillation and diastolic CHF presents for cardiology followup.    She lives alone.  She continues to drive and does her own shopping, etc.  She has lower extremity edema but this is improved on higher Lasix dose.  No chest pain.  She is short of breath after walking about 100 feet currently.  She is short of breath making up bed or walking up steps.  No orthopnea, PND, falls, or syncope/lightheadedness. Weight is down 2 lbs.  No melena or BRBPR.   ECG: Atrial fibrillation, left axis deviation, nonspecific T wave flattening  Labs (10/14): K 3.8, creatinine 0.7, BNP 96, HCT 39.7 Labs (1/15): K 3.7, creatinine 0.7, BNP 99, LDL 101, HDL 40 Labs (4/15): K 3.9, creatinine 0.6, BNP 94, HCT 39.5 Labs (8/15): HCT 38.2, BNP 167 Labs (9/15): K 4.2, creatinine 0.7  PMH: 1. Permanent atrial fibrillation: Holter monitor in the past showed bradycardia/pauses, so she is not on nodal blockers.  2. HTN 3. Chronic diastolic CHF: Echo (98/33) with EF 60-65%, mild LVH, moderately dilated RV with moderately decreased RV systolic function.  4. GERD 5. OA 6. Obesity 7. Lexiscan Cardiolite (10/14) with medium-sized inferior and inferoseptal fixed defect (old MI versus attenuation) and no ischemia.   SH: Widow, nonsmoker, lives in [place], [place].   FH: No premature CAD  ROS: All systems reviewed and negative except as per HPI.   Current Outpatient Prescriptions  Medication Sig Dispense Refill  . amLODipine-benazepril (LOTREL) 5-20 MG per capsule Take 1 capsule by mouth daily.    Marland Kitchen acetaminophen (TYLENOL 8 HOUR) 650 MG CR tablet Take 1 tablet (650 mg total) by mouth every 8 (eight) hours as needed for pain. 30 tablet 3  . apixaban (ELIQUIS) 5 MG TABS tablet Take 1 tablet (5 mg total) by mouth 2 (two) times daily. 60 tablet 11  . calcium-vitamin D (OSCAL WITH D  500-200) 500-200 MG-UNIT per tablet Take 1 tablet by mouth daily.      . Cholecalciferol (VITAMIN D-3) 1000 UNITS CAPS Take by mouth daily.    . clotrimazole-betamethasone (LOTRISONE) cream APPLY A GENEROUS AMOUNT TO AFFECTED AREA TWICE DAILY 90 g 2  . esomeprazole (NEXIUM) 20 MG capsule Take 1 capsule (20 mg total) by mouth daily. 30 capsule 2  . fish oil-omega-3 fatty acids 1000 MG capsule Take 2 g by mouth daily.    . furosemide (LASIX) 40 MG tablet 1 and 1/2 tablets (60mg ) in the AM and 1 tablet (40mg ) around 4PM every day 90 tablet 4  . gabapentin (NEURONTIN) 100 MG capsule Take 1 capsule (100 mg total) by mouth 3 (three) times daily. 90 capsule 0  . Multiple Vitamin (MULTIVITAMIN) tablet Take 1 tablet by mouth daily.      . nitroGLYCERIN (NITROSTAT) 0.4 MG SL tablet Place 1 tablet (0.4 mg total) under the tongue every 5 (five) minutes as needed for chest pain. 25 tablet 3  . ondansetron (ZOFRAN) 4 MG tablet Take 1 tablet (4 mg total) by mouth 2 (two) times daily. As needed for nausea 12 tablet 0  . spironolactone (ALDACTONE) 25 MG tablet take 1 tablet by mouth once daily 30 tablet 4   No current facility-administered medications for this visit.   BP 126/90 mmHg  Pulse 85  Ht 5\' 6"  (1.676 m)  Wt 237 lb (107.502 kg)  BMI 38.27  kg/m2 General: NAD Neck: JVP 7 cm, no thyromegaly or thyroid nodule.  Lungs: Clear to auscultation bilaterally with normal respiratory effort. CV: Nondisplaced PMI.  Heart irregular S1/S2, no S3/S4, no murmur.  Trace ankle edema.  No carotid bruit.  Normal pedal pulses.  Abdomen: Soft, nontender, no hepatosplenomegaly, no distention.  Skin: Intact without lesions or rashes.  Neurologic: Alert and oriented x 3.  Psych: Normal affect. Extremities: No clubbing or cyanosis.   Assessment/Plan: 1. Chronic diastolic CHF: Stable NYHA class III symptoms.  Volume status does look better on current Lasix.   - Continue Lasix 60 qam, 40 qpm.  - BMET today.  2. Permanent  atrial fibrillation: Rate is controlled off nodal blockers (had bradycardia on nodal blockers).  She is on Eliquis now and seems to be tolerating it well.   3. HTN: BP is controlled.   Loralie Champagne 11/21/2013

## 2013-11-22 ENCOUNTER — Ambulatory Visit: Payer: PRIVATE HEALTH INSURANCE

## 2013-11-22 NOTE — Addendum Note (Signed)
Addended by: Katrine Coho on: 11/22/2013 09:32 AM   Modules accepted: Orders

## 2014-01-17 DIAGNOSIS — M179 Osteoarthritis of knee, unspecified: Secondary | ICD-10-CM | POA: Diagnosis not present

## 2014-01-17 DIAGNOSIS — R600 Localized edema: Secondary | ICD-10-CM | POA: Diagnosis not present

## 2014-01-17 DIAGNOSIS — I4891 Unspecified atrial fibrillation: Secondary | ICD-10-CM | POA: Diagnosis not present

## 2014-01-17 DIAGNOSIS — I5032 Chronic diastolic (congestive) heart failure: Secondary | ICD-10-CM | POA: Diagnosis not present

## 2014-01-18 DIAGNOSIS — M179 Osteoarthritis of knee, unspecified: Secondary | ICD-10-CM | POA: Diagnosis not present

## 2014-01-18 DIAGNOSIS — I5032 Chronic diastolic (congestive) heart failure: Secondary | ICD-10-CM | POA: Diagnosis not present

## 2014-01-18 DIAGNOSIS — I4891 Unspecified atrial fibrillation: Secondary | ICD-10-CM | POA: Diagnosis not present

## 2014-01-18 DIAGNOSIS — R600 Localized edema: Secondary | ICD-10-CM | POA: Diagnosis not present

## 2014-01-19 DIAGNOSIS — R600 Localized edema: Secondary | ICD-10-CM | POA: Diagnosis not present

## 2014-01-19 DIAGNOSIS — M179 Osteoarthritis of knee, unspecified: Secondary | ICD-10-CM | POA: Diagnosis not present

## 2014-01-19 DIAGNOSIS — I4891 Unspecified atrial fibrillation: Secondary | ICD-10-CM | POA: Diagnosis not present

## 2014-01-19 DIAGNOSIS — I5032 Chronic diastolic (congestive) heart failure: Secondary | ICD-10-CM | POA: Diagnosis not present

## 2014-01-20 DIAGNOSIS — I4891 Unspecified atrial fibrillation: Secondary | ICD-10-CM | POA: Diagnosis not present

## 2014-01-20 DIAGNOSIS — I5032 Chronic diastolic (congestive) heart failure: Secondary | ICD-10-CM | POA: Diagnosis not present

## 2014-01-20 DIAGNOSIS — R600 Localized edema: Secondary | ICD-10-CM | POA: Diagnosis not present

## 2014-01-20 DIAGNOSIS — M179 Osteoarthritis of knee, unspecified: Secondary | ICD-10-CM | POA: Diagnosis not present

## 2014-01-21 DIAGNOSIS — R600 Localized edema: Secondary | ICD-10-CM | POA: Diagnosis not present

## 2014-01-21 DIAGNOSIS — I4891 Unspecified atrial fibrillation: Secondary | ICD-10-CM | POA: Diagnosis not present

## 2014-01-21 DIAGNOSIS — M179 Osteoarthritis of knee, unspecified: Secondary | ICD-10-CM | POA: Diagnosis not present

## 2014-01-21 DIAGNOSIS — I5032 Chronic diastolic (congestive) heart failure: Secondary | ICD-10-CM | POA: Diagnosis not present

## 2014-01-24 DIAGNOSIS — R600 Localized edema: Secondary | ICD-10-CM | POA: Diagnosis not present

## 2014-01-24 DIAGNOSIS — I5032 Chronic diastolic (congestive) heart failure: Secondary | ICD-10-CM | POA: Diagnosis not present

## 2014-01-24 DIAGNOSIS — I4891 Unspecified atrial fibrillation: Secondary | ICD-10-CM | POA: Diagnosis not present

## 2014-01-24 DIAGNOSIS — M179 Osteoarthritis of knee, unspecified: Secondary | ICD-10-CM | POA: Diagnosis not present

## 2014-01-25 DIAGNOSIS — I4891 Unspecified atrial fibrillation: Secondary | ICD-10-CM | POA: Diagnosis not present

## 2014-01-25 DIAGNOSIS — I5032 Chronic diastolic (congestive) heart failure: Secondary | ICD-10-CM | POA: Diagnosis not present

## 2014-01-25 DIAGNOSIS — M179 Osteoarthritis of knee, unspecified: Secondary | ICD-10-CM | POA: Diagnosis not present

## 2014-01-25 DIAGNOSIS — R600 Localized edema: Secondary | ICD-10-CM | POA: Diagnosis not present

## 2014-01-26 ENCOUNTER — Other Ambulatory Visit: Payer: Self-pay | Admitting: Cardiology

## 2014-01-26 DIAGNOSIS — R600 Localized edema: Secondary | ICD-10-CM | POA: Diagnosis not present

## 2014-01-26 DIAGNOSIS — M179 Osteoarthritis of knee, unspecified: Secondary | ICD-10-CM | POA: Diagnosis not present

## 2014-01-26 DIAGNOSIS — I5032 Chronic diastolic (congestive) heart failure: Secondary | ICD-10-CM | POA: Diagnosis not present

## 2014-01-26 DIAGNOSIS — I4891 Unspecified atrial fibrillation: Secondary | ICD-10-CM | POA: Diagnosis not present

## 2014-01-27 DIAGNOSIS — M179 Osteoarthritis of knee, unspecified: Secondary | ICD-10-CM | POA: Diagnosis not present

## 2014-01-27 DIAGNOSIS — R600 Localized edema: Secondary | ICD-10-CM | POA: Diagnosis not present

## 2014-01-27 DIAGNOSIS — I4891 Unspecified atrial fibrillation: Secondary | ICD-10-CM | POA: Diagnosis not present

## 2014-01-27 DIAGNOSIS — I5032 Chronic diastolic (congestive) heart failure: Secondary | ICD-10-CM | POA: Diagnosis not present

## 2014-01-31 DIAGNOSIS — I5032 Chronic diastolic (congestive) heart failure: Secondary | ICD-10-CM | POA: Diagnosis not present

## 2014-01-31 DIAGNOSIS — M179 Osteoarthritis of knee, unspecified: Secondary | ICD-10-CM | POA: Diagnosis not present

## 2014-01-31 DIAGNOSIS — R600 Localized edema: Secondary | ICD-10-CM | POA: Diagnosis not present

## 2014-01-31 DIAGNOSIS — I4891 Unspecified atrial fibrillation: Secondary | ICD-10-CM | POA: Diagnosis not present

## 2014-02-01 ENCOUNTER — Other Ambulatory Visit: Payer: Self-pay | Admitting: Cardiology

## 2014-02-01 ENCOUNTER — Other Ambulatory Visit: Payer: Self-pay

## 2014-02-01 DIAGNOSIS — R600 Localized edema: Secondary | ICD-10-CM | POA: Diagnosis not present

## 2014-02-01 DIAGNOSIS — I5032 Chronic diastolic (congestive) heart failure: Secondary | ICD-10-CM

## 2014-02-01 DIAGNOSIS — I10 Essential (primary) hypertension: Secondary | ICD-10-CM

## 2014-02-01 DIAGNOSIS — M179 Osteoarthritis of knee, unspecified: Secondary | ICD-10-CM | POA: Diagnosis not present

## 2014-02-01 DIAGNOSIS — I4819 Other persistent atrial fibrillation: Secondary | ICD-10-CM

## 2014-02-01 DIAGNOSIS — I4891 Unspecified atrial fibrillation: Secondary | ICD-10-CM | POA: Diagnosis not present

## 2014-02-01 MED ORDER — APIXABAN 5 MG PO TABS: 5.0000 mg | ORAL_TABLET | Freq: Two times a day (BID) | ORAL | Status: DC

## 2014-02-01 MED ORDER — SPIRONOLACTONE 25 MG PO TABS: ORAL_TABLET | ORAL | Status: DC

## 2014-02-02 DIAGNOSIS — R600 Localized edema: Secondary | ICD-10-CM | POA: Diagnosis not present

## 2014-02-02 DIAGNOSIS — M179 Osteoarthritis of knee, unspecified: Secondary | ICD-10-CM | POA: Diagnosis not present

## 2014-02-02 DIAGNOSIS — I5032 Chronic diastolic (congestive) heart failure: Secondary | ICD-10-CM | POA: Diagnosis not present

## 2014-02-02 DIAGNOSIS — I4891 Unspecified atrial fibrillation: Secondary | ICD-10-CM | POA: Diagnosis not present

## 2014-02-08 DIAGNOSIS — R600 Localized edema: Secondary | ICD-10-CM | POA: Diagnosis not present

## 2014-02-08 DIAGNOSIS — M179 Osteoarthritis of knee, unspecified: Secondary | ICD-10-CM | POA: Diagnosis not present

## 2014-02-08 DIAGNOSIS — I4891 Unspecified atrial fibrillation: Secondary | ICD-10-CM | POA: Diagnosis not present

## 2014-02-08 DIAGNOSIS — I5032 Chronic diastolic (congestive) heart failure: Secondary | ICD-10-CM | POA: Diagnosis not present

## 2014-02-09 DIAGNOSIS — I5032 Chronic diastolic (congestive) heart failure: Secondary | ICD-10-CM | POA: Diagnosis not present

## 2014-02-09 DIAGNOSIS — R600 Localized edema: Secondary | ICD-10-CM | POA: Diagnosis not present

## 2014-02-09 DIAGNOSIS — M179 Osteoarthritis of knee, unspecified: Secondary | ICD-10-CM | POA: Diagnosis not present

## 2014-02-09 DIAGNOSIS — I4891 Unspecified atrial fibrillation: Secondary | ICD-10-CM | POA: Diagnosis not present

## 2014-02-10 DIAGNOSIS — I5032 Chronic diastolic (congestive) heart failure: Secondary | ICD-10-CM | POA: Diagnosis not present

## 2014-02-10 DIAGNOSIS — R600 Localized edema: Secondary | ICD-10-CM | POA: Diagnosis not present

## 2014-02-10 DIAGNOSIS — I4891 Unspecified atrial fibrillation: Secondary | ICD-10-CM | POA: Diagnosis not present

## 2014-02-10 DIAGNOSIS — M179 Osteoarthritis of knee, unspecified: Secondary | ICD-10-CM | POA: Diagnosis not present

## 2014-02-11 DIAGNOSIS — M179 Osteoarthritis of knee, unspecified: Secondary | ICD-10-CM | POA: Diagnosis not present

## 2014-02-11 DIAGNOSIS — I4891 Unspecified atrial fibrillation: Secondary | ICD-10-CM | POA: Diagnosis not present

## 2014-02-11 DIAGNOSIS — I5032 Chronic diastolic (congestive) heart failure: Secondary | ICD-10-CM | POA: Diagnosis not present

## 2014-02-11 DIAGNOSIS — R600 Localized edema: Secondary | ICD-10-CM | POA: Diagnosis not present

## 2014-02-12 ENCOUNTER — Other Ambulatory Visit: Payer: Self-pay | Admitting: Family Medicine

## 2014-02-14 DIAGNOSIS — M179 Osteoarthritis of knee, unspecified: Secondary | ICD-10-CM | POA: Diagnosis not present

## 2014-02-14 DIAGNOSIS — I4891 Unspecified atrial fibrillation: Secondary | ICD-10-CM | POA: Diagnosis not present

## 2014-02-14 DIAGNOSIS — I5032 Chronic diastolic (congestive) heart failure: Secondary | ICD-10-CM | POA: Diagnosis not present

## 2014-02-14 DIAGNOSIS — R600 Localized edema: Secondary | ICD-10-CM | POA: Diagnosis not present

## 2014-02-15 DIAGNOSIS — I4891 Unspecified atrial fibrillation: Secondary | ICD-10-CM | POA: Diagnosis not present

## 2014-02-15 DIAGNOSIS — R600 Localized edema: Secondary | ICD-10-CM | POA: Diagnosis not present

## 2014-02-15 DIAGNOSIS — I5032 Chronic diastolic (congestive) heart failure: Secondary | ICD-10-CM | POA: Diagnosis not present

## 2014-02-15 DIAGNOSIS — M179 Osteoarthritis of knee, unspecified: Secondary | ICD-10-CM | POA: Diagnosis not present

## 2014-02-16 DIAGNOSIS — R600 Localized edema: Secondary | ICD-10-CM | POA: Diagnosis not present

## 2014-02-16 DIAGNOSIS — I4891 Unspecified atrial fibrillation: Secondary | ICD-10-CM | POA: Diagnosis not present

## 2014-02-16 DIAGNOSIS — M179 Osteoarthritis of knee, unspecified: Secondary | ICD-10-CM | POA: Diagnosis not present

## 2014-02-16 DIAGNOSIS — I5032 Chronic diastolic (congestive) heart failure: Secondary | ICD-10-CM | POA: Diagnosis not present

## 2014-02-17 ENCOUNTER — Telehealth: Payer: Self-pay | Admitting: *Deleted

## 2014-02-17 DIAGNOSIS — R600 Localized edema: Secondary | ICD-10-CM | POA: Diagnosis not present

## 2014-02-17 DIAGNOSIS — I5032 Chronic diastolic (congestive) heart failure: Secondary | ICD-10-CM | POA: Diagnosis not present

## 2014-02-17 DIAGNOSIS — M179 Osteoarthritis of knee, unspecified: Secondary | ICD-10-CM | POA: Diagnosis not present

## 2014-02-17 DIAGNOSIS — I4891 Unspecified atrial fibrillation: Secondary | ICD-10-CM | POA: Diagnosis not present

## 2014-02-17 NOTE — Telephone Encounter (Signed)
Obtained PA for Eliquis until 02/18/15  from [facility]--[phone]--PA # [id number].  Pt notified.

## 2014-02-18 DIAGNOSIS — R600 Localized edema: Secondary | ICD-10-CM | POA: Diagnosis not present

## 2014-02-18 DIAGNOSIS — I4891 Unspecified atrial fibrillation: Secondary | ICD-10-CM | POA: Diagnosis not present

## 2014-02-18 DIAGNOSIS — I5032 Chronic diastolic (congestive) heart failure: Secondary | ICD-10-CM | POA: Diagnosis not present

## 2014-02-18 DIAGNOSIS — M179 Osteoarthritis of knee, unspecified: Secondary | ICD-10-CM | POA: Diagnosis not present

## 2014-02-21 DIAGNOSIS — M179 Osteoarthritis of knee, unspecified: Secondary | ICD-10-CM | POA: Diagnosis not present

## 2014-02-21 DIAGNOSIS — I4891 Unspecified atrial fibrillation: Secondary | ICD-10-CM | POA: Diagnosis not present

## 2014-02-21 DIAGNOSIS — R600 Localized edema: Secondary | ICD-10-CM | POA: Diagnosis not present

## 2014-02-21 DIAGNOSIS — I5032 Chronic diastolic (congestive) heart failure: Secondary | ICD-10-CM | POA: Diagnosis not present

## 2014-02-22 DIAGNOSIS — I5032 Chronic diastolic (congestive) heart failure: Secondary | ICD-10-CM | POA: Diagnosis not present

## 2014-02-22 DIAGNOSIS — I4891 Unspecified atrial fibrillation: Secondary | ICD-10-CM | POA: Diagnosis not present

## 2014-02-22 DIAGNOSIS — R600 Localized edema: Secondary | ICD-10-CM | POA: Diagnosis not present

## 2014-02-22 DIAGNOSIS — M179 Osteoarthritis of knee, unspecified: Secondary | ICD-10-CM | POA: Diagnosis not present

## 2014-02-23 DIAGNOSIS — I5032 Chronic diastolic (congestive) heart failure: Secondary | ICD-10-CM | POA: Diagnosis not present

## 2014-02-23 DIAGNOSIS — R600 Localized edema: Secondary | ICD-10-CM | POA: Diagnosis not present

## 2014-02-23 DIAGNOSIS — I4891 Unspecified atrial fibrillation: Secondary | ICD-10-CM | POA: Diagnosis not present

## 2014-02-23 DIAGNOSIS — M179 Osteoarthritis of knee, unspecified: Secondary | ICD-10-CM | POA: Diagnosis not present

## 2014-02-24 DIAGNOSIS — M179 Osteoarthritis of knee, unspecified: Secondary | ICD-10-CM | POA: Diagnosis not present

## 2014-02-24 DIAGNOSIS — I5032 Chronic diastolic (congestive) heart failure: Secondary | ICD-10-CM | POA: Diagnosis not present

## 2014-02-24 DIAGNOSIS — I4891 Unspecified atrial fibrillation: Secondary | ICD-10-CM | POA: Diagnosis not present

## 2014-02-24 DIAGNOSIS — R600 Localized edema: Secondary | ICD-10-CM | POA: Diagnosis not present

## 2014-02-25 DIAGNOSIS — I4891 Unspecified atrial fibrillation: Secondary | ICD-10-CM | POA: Diagnosis not present

## 2014-02-25 DIAGNOSIS — R600 Localized edema: Secondary | ICD-10-CM | POA: Diagnosis not present

## 2014-02-25 DIAGNOSIS — I5032 Chronic diastolic (congestive) heart failure: Secondary | ICD-10-CM | POA: Diagnosis not present

## 2014-02-25 DIAGNOSIS — M179 Osteoarthritis of knee, unspecified: Secondary | ICD-10-CM | POA: Diagnosis not present

## 2014-03-01 DIAGNOSIS — M179 Osteoarthritis of knee, unspecified: Secondary | ICD-10-CM | POA: Diagnosis not present

## 2014-03-01 DIAGNOSIS — R600 Localized edema: Secondary | ICD-10-CM | POA: Diagnosis not present

## 2014-03-01 DIAGNOSIS — I5032 Chronic diastolic (congestive) heart failure: Secondary | ICD-10-CM | POA: Diagnosis not present

## 2014-03-01 DIAGNOSIS — I4891 Unspecified atrial fibrillation: Secondary | ICD-10-CM | POA: Diagnosis not present

## 2014-03-02 DIAGNOSIS — I4891 Unspecified atrial fibrillation: Secondary | ICD-10-CM | POA: Diagnosis not present

## 2014-03-02 DIAGNOSIS — M179 Osteoarthritis of knee, unspecified: Secondary | ICD-10-CM | POA: Diagnosis not present

## 2014-03-02 DIAGNOSIS — R600 Localized edema: Secondary | ICD-10-CM | POA: Diagnosis not present

## 2014-03-02 DIAGNOSIS — I5032 Chronic diastolic (congestive) heart failure: Secondary | ICD-10-CM | POA: Diagnosis not present

## 2014-03-03 DIAGNOSIS — M179 Osteoarthritis of knee, unspecified: Secondary | ICD-10-CM | POA: Diagnosis not present

## 2014-03-03 DIAGNOSIS — R600 Localized edema: Secondary | ICD-10-CM | POA: Diagnosis not present

## 2014-03-03 DIAGNOSIS — I5032 Chronic diastolic (congestive) heart failure: Secondary | ICD-10-CM | POA: Diagnosis not present

## 2014-03-03 DIAGNOSIS — I4891 Unspecified atrial fibrillation: Secondary | ICD-10-CM | POA: Diagnosis not present

## 2014-03-04 DIAGNOSIS — M179 Osteoarthritis of knee, unspecified: Secondary | ICD-10-CM | POA: Diagnosis not present

## 2014-03-04 DIAGNOSIS — I4891 Unspecified atrial fibrillation: Secondary | ICD-10-CM | POA: Diagnosis not present

## 2014-03-04 DIAGNOSIS — R600 Localized edema: Secondary | ICD-10-CM | POA: Diagnosis not present

## 2014-03-04 DIAGNOSIS — I5032 Chronic diastolic (congestive) heart failure: Secondary | ICD-10-CM | POA: Diagnosis not present

## 2014-03-07 DIAGNOSIS — I5032 Chronic diastolic (congestive) heart failure: Secondary | ICD-10-CM | POA: Diagnosis not present

## 2014-03-07 DIAGNOSIS — I4891 Unspecified atrial fibrillation: Secondary | ICD-10-CM | POA: Diagnosis not present

## 2014-03-07 DIAGNOSIS — M179 Osteoarthritis of knee, unspecified: Secondary | ICD-10-CM | POA: Diagnosis not present

## 2014-03-07 DIAGNOSIS — R600 Localized edema: Secondary | ICD-10-CM | POA: Diagnosis not present

## 2014-03-08 DIAGNOSIS — I4891 Unspecified atrial fibrillation: Secondary | ICD-10-CM | POA: Diagnosis not present

## 2014-03-08 DIAGNOSIS — I5032 Chronic diastolic (congestive) heart failure: Secondary | ICD-10-CM | POA: Diagnosis not present

## 2014-03-08 DIAGNOSIS — M179 Osteoarthritis of knee, unspecified: Secondary | ICD-10-CM | POA: Diagnosis not present

## 2014-03-08 DIAGNOSIS — R600 Localized edema: Secondary | ICD-10-CM | POA: Diagnosis not present

## 2014-03-09 DIAGNOSIS — I5032 Chronic diastolic (congestive) heart failure: Secondary | ICD-10-CM | POA: Diagnosis not present

## 2014-03-09 DIAGNOSIS — R600 Localized edema: Secondary | ICD-10-CM | POA: Diagnosis not present

## 2014-03-09 DIAGNOSIS — M179 Osteoarthritis of knee, unspecified: Secondary | ICD-10-CM | POA: Diagnosis not present

## 2014-03-09 DIAGNOSIS — I4891 Unspecified atrial fibrillation: Secondary | ICD-10-CM | POA: Diagnosis not present

## 2014-03-10 DIAGNOSIS — I5032 Chronic diastolic (congestive) heart failure: Secondary | ICD-10-CM | POA: Diagnosis not present

## 2014-03-10 DIAGNOSIS — M179 Osteoarthritis of knee, unspecified: Secondary | ICD-10-CM | POA: Diagnosis not present

## 2014-03-10 DIAGNOSIS — R600 Localized edema: Secondary | ICD-10-CM | POA: Diagnosis not present

## 2014-03-10 DIAGNOSIS — I4891 Unspecified atrial fibrillation: Secondary | ICD-10-CM | POA: Diagnosis not present

## 2014-03-11 DIAGNOSIS — R600 Localized edema: Secondary | ICD-10-CM | POA: Diagnosis not present

## 2014-03-11 DIAGNOSIS — M179 Osteoarthritis of knee, unspecified: Secondary | ICD-10-CM | POA: Diagnosis not present

## 2014-03-11 DIAGNOSIS — I4891 Unspecified atrial fibrillation: Secondary | ICD-10-CM | POA: Diagnosis not present

## 2014-03-11 DIAGNOSIS — I5032 Chronic diastolic (congestive) heart failure: Secondary | ICD-10-CM | POA: Diagnosis not present

## 2014-03-14 DIAGNOSIS — I4891 Unspecified atrial fibrillation: Secondary | ICD-10-CM | POA: Diagnosis not present

## 2014-03-14 DIAGNOSIS — R600 Localized edema: Secondary | ICD-10-CM | POA: Diagnosis not present

## 2014-03-14 DIAGNOSIS — M179 Osteoarthritis of knee, unspecified: Secondary | ICD-10-CM | POA: Diagnosis not present

## 2014-03-14 DIAGNOSIS — I5032 Chronic diastolic (congestive) heart failure: Secondary | ICD-10-CM | POA: Diagnosis not present

## 2014-03-15 DIAGNOSIS — I4891 Unspecified atrial fibrillation: Secondary | ICD-10-CM | POA: Diagnosis not present

## 2014-03-15 DIAGNOSIS — M179 Osteoarthritis of knee, unspecified: Secondary | ICD-10-CM | POA: Diagnosis not present

## 2014-03-15 DIAGNOSIS — R600 Localized edema: Secondary | ICD-10-CM | POA: Diagnosis not present

## 2014-03-15 DIAGNOSIS — I5032 Chronic diastolic (congestive) heart failure: Secondary | ICD-10-CM | POA: Diagnosis not present

## 2014-03-16 DIAGNOSIS — R600 Localized edema: Secondary | ICD-10-CM | POA: Diagnosis not present

## 2014-03-16 DIAGNOSIS — I4891 Unspecified atrial fibrillation: Secondary | ICD-10-CM | POA: Diagnosis not present

## 2014-03-16 DIAGNOSIS — I5032 Chronic diastolic (congestive) heart failure: Secondary | ICD-10-CM | POA: Diagnosis not present

## 2014-03-16 DIAGNOSIS — M179 Osteoarthritis of knee, unspecified: Secondary | ICD-10-CM | POA: Diagnosis not present

## 2014-03-18 DIAGNOSIS — I4891 Unspecified atrial fibrillation: Secondary | ICD-10-CM | POA: Diagnosis not present

## 2014-03-18 DIAGNOSIS — I5032 Chronic diastolic (congestive) heart failure: Secondary | ICD-10-CM | POA: Diagnosis not present

## 2014-03-18 DIAGNOSIS — R600 Localized edema: Secondary | ICD-10-CM | POA: Diagnosis not present

## 2014-03-18 DIAGNOSIS — M179 Osteoarthritis of knee, unspecified: Secondary | ICD-10-CM | POA: Diagnosis not present

## 2014-03-21 ENCOUNTER — Ambulatory Visit (INDEPENDENT_AMBULATORY_CARE_PROVIDER_SITE_OTHER): Payer: Medicare Other | Admitting: Cardiology

## 2014-03-21 ENCOUNTER — Encounter: Payer: Self-pay | Admitting: Cardiology

## 2014-03-21 VITALS — BP 122/64 | HR 79 | Ht 66.0 in | Wt 230.0 lb

## 2014-03-21 DIAGNOSIS — R600 Localized edema: Secondary | ICD-10-CM | POA: Diagnosis not present

## 2014-03-21 DIAGNOSIS — I4819 Other persistent atrial fibrillation: Secondary | ICD-10-CM

## 2014-03-21 DIAGNOSIS — I481 Persistent atrial fibrillation: Secondary | ICD-10-CM

## 2014-03-21 DIAGNOSIS — I4891 Unspecified atrial fibrillation: Secondary | ICD-10-CM | POA: Diagnosis not present

## 2014-03-21 DIAGNOSIS — I5032 Chronic diastolic (congestive) heart failure: Secondary | ICD-10-CM

## 2014-03-21 DIAGNOSIS — M179 Osteoarthritis of knee, unspecified: Secondary | ICD-10-CM | POA: Diagnosis not present

## 2014-03-21 LAB — CBC WITH DIFFERENTIAL/PLATELET
BASOS ABS: 0 10*3/uL (ref 0.0–0.1)
Basophils Relative: 0.5 % (ref 0.0–3.0)
EOS ABS: 0.1 10*3/uL (ref 0.0–0.7)
Eosinophils Relative: 1.1 % (ref 0.0–5.0)
HCT: 39.8 % (ref 36.0–46.0)
HEMOGLOBIN: 13.1 g/dL (ref 12.0–15.0)
LYMPHS PCT: 43.9 % (ref 12.0–46.0)
Lymphs Abs: 3 10*3/uL (ref 0.7–4.0)
MCHC: 33 g/dL (ref 30.0–36.0)
MCV: 85.8 fl (ref 78.0–100.0)
Monocytes Absolute: 0.6 10*3/uL (ref 0.1–1.0)
Monocytes Relative: 8 % (ref 3.0–12.0)
NEUTROS ABS: 3.2 10*3/uL (ref 1.4–7.7)
Neutrophils Relative %: 46.5 % (ref 43.0–77.0)
Platelets: 208 10*3/uL (ref 150.0–400.0)
RBC: 4.64 Mil/uL (ref 3.87–5.11)
RDW: 14.8 % (ref 11.5–15.5)
WBC: 6.9 10*3/uL (ref 4.0–10.5)

## 2014-03-21 LAB — BASIC METABOLIC PANEL
BUN: 16 mg/dL (ref 6–23)
CO2: 32 mEq/L (ref 19–32)
CREATININE: 0.77 mg/dL (ref 0.40–1.20)
Calcium: 10 mg/dL (ref 8.4–10.5)
Chloride: 103 mEq/L (ref 96–112)
GFR: 92.31 mL/min (ref >60.00)
Glucose, Bld: 107 mg/dL — ABNORMAL HIGH (ref 70–99)
Potassium: 4.4 mEq/L (ref 3.5–5.1)
Sodium: 138 mEq/L (ref 135–145)

## 2014-03-21 LAB — BRAIN NATRIURETIC PEPTIDE: Pro B Natriuretic peptide (BNP): 183 pg/mL — ABNORMAL HIGH (ref 0.0–100.0)

## 2014-03-21 NOTE — Progress Notes (Signed)
Patient ID: Bianca [name], female   DOB: 06-15-1930, 81 y.o.   MRN: [id number] PCP: Dr. Raeford Razor  81 yo with history of permanent atrial fibrillation and diastolic CHF presents for cardiology followup.   She lives alone.  She continues to drive and does her own shopping, etc.  No chest pain.  No dyspnea walking short distances, dyspnea with longer distances.  She is short of breath making up bed or walking up steps.  No orthopnea, PND, falls, or syncope/lightheadedness. Weight is down 7 lbs; she has started [employer].  She has low back pain with sciatica and knee OA.  No melena or BRBPR.   ECG: Atrial fibrillation, left axis deviation, nonspecific T wave flattening  Labs (10/14): K 3.8, creatinine 0.7, BNP 96, HCT 39.7 Labs (1/15): K 3.7, creatinine 0.7, BNP 99, LDL 101, HDL 40 Labs (4/15): K 3.9, creatinine 0.6, BNP 94, HCT 39.5 Labs (8/15): HCT 38.2, BNP 167 Labs (9/15): K 4.2, creatinine 0.7 Labs (11/15): K 3.9, creatinine 0.7  PMH: 1. Permanent atrial fibrillation: Holter monitor in the past showed bradycardia/pauses, so she is not on nodal blockers.  2. HTN 3. Chronic diastolic CHF: Echo (25/85) with EF 60-65%, mild LVH, moderately dilated RV with moderately decreased RV systolic function.  4. GERD 5. OA 6. Obesity 7. Lexiscan Cardiolite (10/14) with medium-sized inferior and inferoseptal fixed defect (old MI versus attenuation) and no ischemia.  8. Low back pain with sciatica  SH: Widow, nonsmoker, lives in [place], [place].   FH: No premature CAD  ROS: All systems reviewed and negative except as per HPI.   Current Outpatient Prescriptions  Medication Sig Dispense Refill  . acetaminophen (TYLENOL 8 HOUR) 650 MG CR tablet Take 1 tablet (650 mg total) by mouth every 8 (eight) hours as needed for pain. 30 tablet 3  . amLODipine-benazepril (LOTREL) 5-20 MG per capsule take 1 capsule by mouth once daily 90 capsule 3  . apixaban (ELIQUIS) 5 MG TABS tablet Take 1  tablet (5 mg total) by mouth 2 (two) times daily. 60 tablet 4  . calcium-vitamin D (OSCAL WITH D 500-200) 500-200 MG-UNIT per tablet Take 1 tablet by mouth daily.      . Cholecalciferol (VITAMIN D-3) 1000 UNITS CAPS Take by mouth daily.    . clotrimazole-betamethasone (LOTRISONE) cream APPLY A GENEROUS AMOUNT TO AFFECTED AREA TWICE DAILY 90 g 2  . fish oil-omega-3 fatty acids 1000 MG capsule Take 2 g by mouth daily.    . furosemide (LASIX) 40 MG tablet 1 and 1/2 tablets (60mg ) in the AM and 1 tablet (40mg ) around 4PM every day 90 tablet 4  . Multiple Vitamin (MULTIVITAMIN) tablet Take 1 tablet by mouth daily.      . nitroGLYCERIN (NITROSTAT) 0.4 MG SL tablet Place 1 tablet (0.4 mg total) under the tongue every 5 (five) minutes as needed for chest pain. 25 tablet 3  . ondansetron (ZOFRAN) 4 MG tablet Take 1 tablet (4 mg total) by mouth 2 (two) times daily. As needed for nausea 12 tablet 0  . spironolactone (ALDACTONE) 25 MG tablet take 1 tablet by mouth once daily 30 tablet 4   No current facility-administered medications for this visit.   BP 122/64 mmHg  Pulse 79  Ht 5\' 6"  (1.676 m)  Wt 230 lb (104.327 kg)  BMI 37.14 kg/m2 General: NAD Neck: JVP 7 cm, no thyromegaly or thyroid nodule.  Lungs: Clear to auscultation bilaterally with normal respiratory effort. CV: Nondisplaced PMI.  Heart irregular  S1/S2, no S3/S4, no murmur.  No ankle edema.  No carotid bruit.  Normal pedal pulses.  Abdomen: Soft, nontender, no hepatosplenomegaly, no distention.  Skin: Intact without lesions or rashes.  Neurologic: Alert and oriented x 3.  Psych: Normal affect. Extremities: No clubbing or cyanosis.   Assessment/Plan: 1. Chronic diastolic CHF: Stable NYHA class II-III symptoms. Volume status ok on current Lasix.   - Continue Lasix 60 qam, 40 qpm.  - BMET/BNP today.  2. Permanent atrial fibrillation: Rate is controlled off nodal blockers (had bradycardia on nodal blockers).  She is on Eliquis now and  seems to be tolerating it well.  Check CBC.  3. HTN: BP is controlled.   Followup in 6 months.   Loralie Champagne 03/21/2014

## 2014-03-21 NOTE — Patient Instructions (Signed)
Your physician recommends that you have lab work today--BMET/BNP/CBCd  Your physician wants you to follow-up in: 6 months with Dr Aundra Dubin. (September 2016).  You will receive a reminder letter in the mail two months in advance. If you don't receive a letter, please call our office to schedule the follow-up appointment.

## 2014-03-23 DIAGNOSIS — M179 Osteoarthritis of knee, unspecified: Secondary | ICD-10-CM | POA: Diagnosis not present

## 2014-03-23 DIAGNOSIS — I4891 Unspecified atrial fibrillation: Secondary | ICD-10-CM | POA: Diagnosis not present

## 2014-03-23 DIAGNOSIS — I5032 Chronic diastolic (congestive) heart failure: Secondary | ICD-10-CM | POA: Diagnosis not present

## 2014-03-23 DIAGNOSIS — R600 Localized edema: Secondary | ICD-10-CM | POA: Diagnosis not present

## 2014-03-24 DIAGNOSIS — R600 Localized edema: Secondary | ICD-10-CM | POA: Diagnosis not present

## 2014-03-24 DIAGNOSIS — I5032 Chronic diastolic (congestive) heart failure: Secondary | ICD-10-CM | POA: Diagnosis not present

## 2014-03-24 DIAGNOSIS — I4891 Unspecified atrial fibrillation: Secondary | ICD-10-CM | POA: Diagnosis not present

## 2014-03-24 DIAGNOSIS — M179 Osteoarthritis of knee, unspecified: Secondary | ICD-10-CM | POA: Diagnosis not present

## 2014-03-25 DIAGNOSIS — M179 Osteoarthritis of knee, unspecified: Secondary | ICD-10-CM | POA: Diagnosis not present

## 2014-03-25 DIAGNOSIS — R600 Localized edema: Secondary | ICD-10-CM | POA: Diagnosis not present

## 2014-03-25 DIAGNOSIS — I5032 Chronic diastolic (congestive) heart failure: Secondary | ICD-10-CM | POA: Diagnosis not present

## 2014-03-25 DIAGNOSIS — I4891 Unspecified atrial fibrillation: Secondary | ICD-10-CM | POA: Diagnosis not present

## 2014-03-28 DIAGNOSIS — M179 Osteoarthritis of knee, unspecified: Secondary | ICD-10-CM | POA: Diagnosis not present

## 2014-03-28 DIAGNOSIS — I4891 Unspecified atrial fibrillation: Secondary | ICD-10-CM | POA: Diagnosis not present

## 2014-03-28 DIAGNOSIS — R600 Localized edema: Secondary | ICD-10-CM | POA: Diagnosis not present

## 2014-03-28 DIAGNOSIS — I5032 Chronic diastolic (congestive) heart failure: Secondary | ICD-10-CM | POA: Diagnosis not present

## 2014-03-29 DIAGNOSIS — I5032 Chronic diastolic (congestive) heart failure: Secondary | ICD-10-CM | POA: Diagnosis not present

## 2014-03-29 DIAGNOSIS — I4891 Unspecified atrial fibrillation: Secondary | ICD-10-CM | POA: Diagnosis not present

## 2014-03-29 DIAGNOSIS — M179 Osteoarthritis of knee, unspecified: Secondary | ICD-10-CM | POA: Diagnosis not present

## 2014-03-29 DIAGNOSIS — R600 Localized edema: Secondary | ICD-10-CM | POA: Diagnosis not present

## 2014-03-30 DIAGNOSIS — M179 Osteoarthritis of knee, unspecified: Secondary | ICD-10-CM | POA: Diagnosis not present

## 2014-03-30 DIAGNOSIS — I4891 Unspecified atrial fibrillation: Secondary | ICD-10-CM | POA: Diagnosis not present

## 2014-03-30 DIAGNOSIS — I5032 Chronic diastolic (congestive) heart failure: Secondary | ICD-10-CM | POA: Diagnosis not present

## 2014-03-30 DIAGNOSIS — R600 Localized edema: Secondary | ICD-10-CM | POA: Diagnosis not present

## 2014-03-31 ENCOUNTER — Other Ambulatory Visit: Payer: Self-pay

## 2014-03-31 DIAGNOSIS — I4891 Unspecified atrial fibrillation: Secondary | ICD-10-CM | POA: Diagnosis not present

## 2014-03-31 DIAGNOSIS — M179 Osteoarthritis of knee, unspecified: Secondary | ICD-10-CM | POA: Diagnosis not present

## 2014-03-31 DIAGNOSIS — Z1231 Encounter for screening mammogram for malignant neoplasm of breast: Secondary | ICD-10-CM

## 2014-03-31 DIAGNOSIS — R600 Localized edema: Secondary | ICD-10-CM | POA: Diagnosis not present

## 2014-03-31 DIAGNOSIS — I5032 Chronic diastolic (congestive) heart failure: Secondary | ICD-10-CM | POA: Diagnosis not present

## 2014-04-01 DIAGNOSIS — R600 Localized edema: Secondary | ICD-10-CM | POA: Diagnosis not present

## 2014-04-01 DIAGNOSIS — I4891 Unspecified atrial fibrillation: Secondary | ICD-10-CM | POA: Diagnosis not present

## 2014-04-01 DIAGNOSIS — M179 Osteoarthritis of knee, unspecified: Secondary | ICD-10-CM | POA: Diagnosis not present

## 2014-04-01 DIAGNOSIS — I5032 Chronic diastolic (congestive) heart failure: Secondary | ICD-10-CM | POA: Diagnosis not present

## 2014-04-04 DIAGNOSIS — I4891 Unspecified atrial fibrillation: Secondary | ICD-10-CM | POA: Diagnosis not present

## 2014-04-04 DIAGNOSIS — I5032 Chronic diastolic (congestive) heart failure: Secondary | ICD-10-CM | POA: Diagnosis not present

## 2014-04-04 DIAGNOSIS — M179 Osteoarthritis of knee, unspecified: Secondary | ICD-10-CM | POA: Diagnosis not present

## 2014-04-04 DIAGNOSIS — R600 Localized edema: Secondary | ICD-10-CM | POA: Diagnosis not present

## 2014-04-05 DIAGNOSIS — I5032 Chronic diastolic (congestive) heart failure: Secondary | ICD-10-CM | POA: Diagnosis not present

## 2014-04-05 DIAGNOSIS — R600 Localized edema: Secondary | ICD-10-CM | POA: Diagnosis not present

## 2014-04-05 DIAGNOSIS — I4891 Unspecified atrial fibrillation: Secondary | ICD-10-CM | POA: Diagnosis not present

## 2014-04-05 DIAGNOSIS — M179 Osteoarthritis of knee, unspecified: Secondary | ICD-10-CM | POA: Diagnosis not present

## 2014-04-06 DIAGNOSIS — M179 Osteoarthritis of knee, unspecified: Secondary | ICD-10-CM | POA: Diagnosis not present

## 2014-04-06 DIAGNOSIS — R600 Localized edema: Secondary | ICD-10-CM | POA: Diagnosis not present

## 2014-04-06 DIAGNOSIS — I5032 Chronic diastolic (congestive) heart failure: Secondary | ICD-10-CM | POA: Diagnosis not present

## 2014-04-06 DIAGNOSIS — I4891 Unspecified atrial fibrillation: Secondary | ICD-10-CM | POA: Diagnosis not present

## 2014-04-07 DIAGNOSIS — I5032 Chronic diastolic (congestive) heart failure: Secondary | ICD-10-CM | POA: Diagnosis not present

## 2014-04-07 DIAGNOSIS — I4891 Unspecified atrial fibrillation: Secondary | ICD-10-CM | POA: Diagnosis not present

## 2014-04-07 DIAGNOSIS — M179 Osteoarthritis of knee, unspecified: Secondary | ICD-10-CM | POA: Diagnosis not present

## 2014-04-07 DIAGNOSIS — R600 Localized edema: Secondary | ICD-10-CM | POA: Diagnosis not present

## 2014-04-08 DIAGNOSIS — R600 Localized edema: Secondary | ICD-10-CM | POA: Diagnosis not present

## 2014-04-08 DIAGNOSIS — M179 Osteoarthritis of knee, unspecified: Secondary | ICD-10-CM | POA: Diagnosis not present

## 2014-04-08 DIAGNOSIS — I4891 Unspecified atrial fibrillation: Secondary | ICD-10-CM | POA: Diagnosis not present

## 2014-04-08 DIAGNOSIS — I5032 Chronic diastolic (congestive) heart failure: Secondary | ICD-10-CM | POA: Diagnosis not present

## 2014-04-12 DIAGNOSIS — I4891 Unspecified atrial fibrillation: Secondary | ICD-10-CM | POA: Diagnosis not present

## 2014-04-12 DIAGNOSIS — M179 Osteoarthritis of knee, unspecified: Secondary | ICD-10-CM | POA: Diagnosis not present

## 2014-04-12 DIAGNOSIS — I5032 Chronic diastolic (congestive) heart failure: Secondary | ICD-10-CM | POA: Diagnosis not present

## 2014-04-12 DIAGNOSIS — R600 Localized edema: Secondary | ICD-10-CM | POA: Diagnosis not present

## 2014-04-13 DIAGNOSIS — I5032 Chronic diastolic (congestive) heart failure: Secondary | ICD-10-CM | POA: Diagnosis not present

## 2014-04-13 DIAGNOSIS — R600 Localized edema: Secondary | ICD-10-CM | POA: Diagnosis not present

## 2014-04-13 DIAGNOSIS — M179 Osteoarthritis of knee, unspecified: Secondary | ICD-10-CM | POA: Diagnosis not present

## 2014-04-13 DIAGNOSIS — I4891 Unspecified atrial fibrillation: Secondary | ICD-10-CM | POA: Diagnosis not present

## 2014-04-14 DIAGNOSIS — I5032 Chronic diastolic (congestive) heart failure: Secondary | ICD-10-CM | POA: Diagnosis not present

## 2014-04-14 DIAGNOSIS — R600 Localized edema: Secondary | ICD-10-CM | POA: Diagnosis not present

## 2014-04-14 DIAGNOSIS — I4891 Unspecified atrial fibrillation: Secondary | ICD-10-CM | POA: Diagnosis not present

## 2014-04-14 DIAGNOSIS — M179 Osteoarthritis of knee, unspecified: Secondary | ICD-10-CM | POA: Diagnosis not present

## 2014-04-15 DIAGNOSIS — R600 Localized edema: Secondary | ICD-10-CM | POA: Diagnosis not present

## 2014-04-15 DIAGNOSIS — M179 Osteoarthritis of knee, unspecified: Secondary | ICD-10-CM | POA: Diagnosis not present

## 2014-04-15 DIAGNOSIS — I4891 Unspecified atrial fibrillation: Secondary | ICD-10-CM | POA: Diagnosis not present

## 2014-04-15 DIAGNOSIS — I5032 Chronic diastolic (congestive) heart failure: Secondary | ICD-10-CM | POA: Diagnosis not present

## 2014-04-19 DIAGNOSIS — M179 Osteoarthritis of knee, unspecified: Secondary | ICD-10-CM | POA: Diagnosis not present

## 2014-04-19 DIAGNOSIS — I4891 Unspecified atrial fibrillation: Secondary | ICD-10-CM | POA: Diagnosis not present

## 2014-04-19 DIAGNOSIS — I5032 Chronic diastolic (congestive) heart failure: Secondary | ICD-10-CM | POA: Diagnosis not present

## 2014-04-19 DIAGNOSIS — R600 Localized edema: Secondary | ICD-10-CM | POA: Diagnosis not present

## 2014-04-20 DIAGNOSIS — I5032 Chronic diastolic (congestive) heart failure: Secondary | ICD-10-CM | POA: Diagnosis not present

## 2014-04-20 DIAGNOSIS — I4891 Unspecified atrial fibrillation: Secondary | ICD-10-CM | POA: Diagnosis not present

## 2014-04-20 DIAGNOSIS — M179 Osteoarthritis of knee, unspecified: Secondary | ICD-10-CM | POA: Diagnosis not present

## 2014-04-20 DIAGNOSIS — R600 Localized edema: Secondary | ICD-10-CM | POA: Diagnosis not present

## 2014-04-21 DIAGNOSIS — I5032 Chronic diastolic (congestive) heart failure: Secondary | ICD-10-CM | POA: Diagnosis not present

## 2014-04-21 DIAGNOSIS — M179 Osteoarthritis of knee, unspecified: Secondary | ICD-10-CM | POA: Diagnosis not present

## 2014-04-21 DIAGNOSIS — R600 Localized edema: Secondary | ICD-10-CM | POA: Diagnosis not present

## 2014-04-21 DIAGNOSIS — I4891 Unspecified atrial fibrillation: Secondary | ICD-10-CM | POA: Diagnosis not present

## 2014-04-22 DIAGNOSIS — R600 Localized edema: Secondary | ICD-10-CM | POA: Diagnosis not present

## 2014-04-22 DIAGNOSIS — I4891 Unspecified atrial fibrillation: Secondary | ICD-10-CM | POA: Diagnosis not present

## 2014-04-22 DIAGNOSIS — I5032 Chronic diastolic (congestive) heart failure: Secondary | ICD-10-CM | POA: Diagnosis not present

## 2014-04-22 DIAGNOSIS — M179 Osteoarthritis of knee, unspecified: Secondary | ICD-10-CM | POA: Diagnosis not present

## 2014-04-25 DIAGNOSIS — M179 Osteoarthritis of knee, unspecified: Secondary | ICD-10-CM | POA: Diagnosis not present

## 2014-04-25 DIAGNOSIS — I4891 Unspecified atrial fibrillation: Secondary | ICD-10-CM | POA: Diagnosis not present

## 2014-04-25 DIAGNOSIS — I5032 Chronic diastolic (congestive) heart failure: Secondary | ICD-10-CM | POA: Diagnosis not present

## 2014-04-25 DIAGNOSIS — R600 Localized edema: Secondary | ICD-10-CM | POA: Diagnosis not present

## 2014-04-26 DIAGNOSIS — I4891 Unspecified atrial fibrillation: Secondary | ICD-10-CM | POA: Diagnosis not present

## 2014-04-26 DIAGNOSIS — I5032 Chronic diastolic (congestive) heart failure: Secondary | ICD-10-CM | POA: Diagnosis not present

## 2014-04-26 DIAGNOSIS — M179 Osteoarthritis of knee, unspecified: Secondary | ICD-10-CM | POA: Diagnosis not present

## 2014-04-26 DIAGNOSIS — R600 Localized edema: Secondary | ICD-10-CM | POA: Diagnosis not present

## 2014-04-27 DIAGNOSIS — M179 Osteoarthritis of knee, unspecified: Secondary | ICD-10-CM | POA: Diagnosis not present

## 2014-04-27 DIAGNOSIS — I5032 Chronic diastolic (congestive) heart failure: Secondary | ICD-10-CM | POA: Diagnosis not present

## 2014-04-27 DIAGNOSIS — R600 Localized edema: Secondary | ICD-10-CM | POA: Diagnosis not present

## 2014-04-27 DIAGNOSIS — I4891 Unspecified atrial fibrillation: Secondary | ICD-10-CM | POA: Diagnosis not present

## 2014-04-28 DIAGNOSIS — M179 Osteoarthritis of knee, unspecified: Secondary | ICD-10-CM | POA: Diagnosis not present

## 2014-04-28 DIAGNOSIS — I4891 Unspecified atrial fibrillation: Secondary | ICD-10-CM | POA: Diagnosis not present

## 2014-04-28 DIAGNOSIS — R600 Localized edema: Secondary | ICD-10-CM | POA: Diagnosis not present

## 2014-04-28 DIAGNOSIS — I5032 Chronic diastolic (congestive) heart failure: Secondary | ICD-10-CM | POA: Diagnosis not present

## 2014-04-29 DIAGNOSIS — I4891 Unspecified atrial fibrillation: Secondary | ICD-10-CM | POA: Diagnosis not present

## 2014-04-29 DIAGNOSIS — R600 Localized edema: Secondary | ICD-10-CM | POA: Diagnosis not present

## 2014-04-29 DIAGNOSIS — M179 Osteoarthritis of knee, unspecified: Secondary | ICD-10-CM | POA: Diagnosis not present

## 2014-04-29 DIAGNOSIS — I5032 Chronic diastolic (congestive) heart failure: Secondary | ICD-10-CM | POA: Diagnosis not present

## 2014-05-03 DIAGNOSIS — M179 Osteoarthritis of knee, unspecified: Secondary | ICD-10-CM | POA: Diagnosis not present

## 2014-05-03 DIAGNOSIS — R600 Localized edema: Secondary | ICD-10-CM | POA: Diagnosis not present

## 2014-05-03 DIAGNOSIS — I5032 Chronic diastolic (congestive) heart failure: Secondary | ICD-10-CM | POA: Diagnosis not present

## 2014-05-03 DIAGNOSIS — I4891 Unspecified atrial fibrillation: Secondary | ICD-10-CM | POA: Diagnosis not present

## 2014-05-04 ENCOUNTER — Ambulatory Visit (INDEPENDENT_AMBULATORY_CARE_PROVIDER_SITE_OTHER): Payer: Medicare Other | Admitting: Family Medicine

## 2014-05-04 ENCOUNTER — Encounter: Payer: Self-pay | Admitting: Family Medicine

## 2014-05-04 VITALS — BP 150/81 | HR 84 | Temp 97.5°F | Ht 66.0 in | Wt 225.7 lb

## 2014-05-04 DIAGNOSIS — I4891 Unspecified atrial fibrillation: Secondary | ICD-10-CM | POA: Diagnosis not present

## 2014-05-04 DIAGNOSIS — L729 Follicular cyst of the skin and subcutaneous tissue, unspecified: Secondary | ICD-10-CM | POA: Diagnosis not present

## 2014-05-04 DIAGNOSIS — L989 Disorder of the skin and subcutaneous tissue, unspecified: Secondary | ICD-10-CM | POA: Diagnosis not present

## 2014-05-04 DIAGNOSIS — R600 Localized edema: Secondary | ICD-10-CM | POA: Diagnosis not present

## 2014-05-04 DIAGNOSIS — M179 Osteoarthritis of knee, unspecified: Secondary | ICD-10-CM | POA: Diagnosis not present

## 2014-05-04 DIAGNOSIS — I5032 Chronic diastolic (congestive) heart failure: Secondary | ICD-10-CM | POA: Diagnosis not present

## 2014-05-04 NOTE — Assessment & Plan Note (Signed)
Right axilla with a benign skin lesion likely underlying cyst. It is not currently infected or bothersome to the patient. Advised watchful waiting. Patient to follow-up with primary if she decides she wants this removed.

## 2014-05-04 NOTE — Progress Notes (Signed)
   Subjective:    Patient ID: [name], female    DOB: Jun 23, 1930, 81 y.o.   MRN: [id number]  HPI 81 year old female presents for for evaluation of a "bump" in her left axilla.  Patient reports that she has had this bump for quite some time. She says she intermittently squeezes it and it drains.  She says that it does not currently bother her.  She has been encouraged to come in for evaluation at the urgency of her friends and family.  She denies associated redness, irritation, pain. No current drainage.  No recent fever, chills.   Additionally, she has two skin lesions (one in the right temporal region and one on the forehead) that she would like evaluated today.  Review of Systems Per HPI    Objective:   Physical Exam Filed Vitals:   05/04/14 1054  BP: 150/81  Pulse: 84  Temp: 97.5 F (36.4 C)   Exam: General: well appearing, NAD. Skin: Right axilla - small pea-sized nodule noted just under the skin.  Firm and mobile.  Lesion appears characteristic of an underlying cyst.  Forehead/Right temporal region - 2 small raised lesions noted. No color change or overlying/surrounding redness.   Procedure: Cryotherapy performed on 2 skin lesions (forehead and right temporal region).  Frozen x 3 (~ 3 sec freeze/thaw cycle).    Assessment & Plan:  See Problem List

## 2014-05-04 NOTE — Assessment & Plan Note (Signed)
Patient with 2 lesions one on forehead and one in right temporal region.  They appear benign. They are likely seborrheic keratoses. Cryotherapy performed today.

## 2014-05-10 DIAGNOSIS — R600 Localized edema: Secondary | ICD-10-CM | POA: Diagnosis not present

## 2014-05-10 DIAGNOSIS — I4891 Unspecified atrial fibrillation: Secondary | ICD-10-CM | POA: Diagnosis not present

## 2014-05-10 DIAGNOSIS — I5032 Chronic diastolic (congestive) heart failure: Secondary | ICD-10-CM | POA: Diagnosis not present

## 2014-05-10 DIAGNOSIS — M179 Osteoarthritis of knee, unspecified: Secondary | ICD-10-CM | POA: Diagnosis not present

## 2014-05-11 DIAGNOSIS — I5032 Chronic diastolic (congestive) heart failure: Secondary | ICD-10-CM | POA: Diagnosis not present

## 2014-05-11 DIAGNOSIS — R600 Localized edema: Secondary | ICD-10-CM | POA: Diagnosis not present

## 2014-05-11 DIAGNOSIS — M179 Osteoarthritis of knee, unspecified: Secondary | ICD-10-CM | POA: Diagnosis not present

## 2014-05-11 DIAGNOSIS — I4891 Unspecified atrial fibrillation: Secondary | ICD-10-CM | POA: Diagnosis not present

## 2014-05-12 ENCOUNTER — Ambulatory Visit
Admission: RE | Admit: 2014-05-12 | Discharge: 2014-05-12 | Disposition: A | Discharge status: Home / Self Care | Payer: Medicare Other | Source: Ambulatory Visit

## 2014-05-12 DIAGNOSIS — M179 Osteoarthritis of knee, unspecified: Secondary | ICD-10-CM | POA: Diagnosis not present

## 2014-05-12 DIAGNOSIS — I5032 Chronic diastolic (congestive) heart failure: Secondary | ICD-10-CM | POA: Diagnosis not present

## 2014-05-12 DIAGNOSIS — Z1231 Encounter for screening mammogram for malignant neoplasm of breast: Secondary | ICD-10-CM | POA: Diagnosis not present

## 2014-05-12 DIAGNOSIS — R600 Localized edema: Secondary | ICD-10-CM | POA: Diagnosis not present

## 2014-05-12 DIAGNOSIS — I4891 Unspecified atrial fibrillation: Secondary | ICD-10-CM | POA: Diagnosis not present

## 2014-05-13 DIAGNOSIS — I5032 Chronic diastolic (congestive) heart failure: Secondary | ICD-10-CM | POA: Diagnosis not present

## 2014-05-13 DIAGNOSIS — R600 Localized edema: Secondary | ICD-10-CM | POA: Diagnosis not present

## 2014-05-13 DIAGNOSIS — M179 Osteoarthritis of knee, unspecified: Secondary | ICD-10-CM | POA: Diagnosis not present

## 2014-05-13 DIAGNOSIS — I4891 Unspecified atrial fibrillation: Secondary | ICD-10-CM | POA: Diagnosis not present

## 2014-05-17 DIAGNOSIS — M179 Osteoarthritis of knee, unspecified: Secondary | ICD-10-CM | POA: Diagnosis not present

## 2014-05-17 DIAGNOSIS — I4891 Unspecified atrial fibrillation: Secondary | ICD-10-CM | POA: Diagnosis not present

## 2014-05-17 DIAGNOSIS — I5032 Chronic diastolic (congestive) heart failure: Secondary | ICD-10-CM | POA: Diagnosis not present

## 2014-05-17 DIAGNOSIS — R600 Localized edema: Secondary | ICD-10-CM | POA: Diagnosis not present

## 2014-05-18 DIAGNOSIS — I4891 Unspecified atrial fibrillation: Secondary | ICD-10-CM | POA: Diagnosis not present

## 2014-05-18 DIAGNOSIS — I5032 Chronic diastolic (congestive) heart failure: Secondary | ICD-10-CM | POA: Diagnosis not present

## 2014-05-18 DIAGNOSIS — M179 Osteoarthritis of knee, unspecified: Secondary | ICD-10-CM | POA: Diagnosis not present

## 2014-05-18 DIAGNOSIS — R600 Localized edema: Secondary | ICD-10-CM | POA: Diagnosis not present

## 2014-05-19 DIAGNOSIS — R600 Localized edema: Secondary | ICD-10-CM | POA: Diagnosis not present

## 2014-05-19 DIAGNOSIS — I4891 Unspecified atrial fibrillation: Secondary | ICD-10-CM | POA: Diagnosis not present

## 2014-05-19 DIAGNOSIS — I5032 Chronic diastolic (congestive) heart failure: Secondary | ICD-10-CM | POA: Diagnosis not present

## 2014-05-19 DIAGNOSIS — M179 Osteoarthritis of knee, unspecified: Secondary | ICD-10-CM | POA: Diagnosis not present

## 2014-05-20 DIAGNOSIS — R600 Localized edema: Secondary | ICD-10-CM | POA: Diagnosis not present

## 2014-05-20 DIAGNOSIS — M179 Osteoarthritis of knee, unspecified: Secondary | ICD-10-CM | POA: Diagnosis not present

## 2014-05-20 DIAGNOSIS — I4891 Unspecified atrial fibrillation: Secondary | ICD-10-CM | POA: Diagnosis not present

## 2014-05-20 DIAGNOSIS — I5032 Chronic diastolic (congestive) heart failure: Secondary | ICD-10-CM | POA: Diagnosis not present

## 2014-05-23 DIAGNOSIS — R600 Localized edema: Secondary | ICD-10-CM | POA: Diagnosis not present

## 2014-05-23 DIAGNOSIS — I5032 Chronic diastolic (congestive) heart failure: Secondary | ICD-10-CM | POA: Diagnosis not present

## 2014-05-23 DIAGNOSIS — M179 Osteoarthritis of knee, unspecified: Secondary | ICD-10-CM | POA: Diagnosis not present

## 2014-05-23 DIAGNOSIS — I4891 Unspecified atrial fibrillation: Secondary | ICD-10-CM | POA: Diagnosis not present

## 2014-05-24 DIAGNOSIS — I5032 Chronic diastolic (congestive) heart failure: Secondary | ICD-10-CM | POA: Diagnosis not present

## 2014-05-24 DIAGNOSIS — M179 Osteoarthritis of knee, unspecified: Secondary | ICD-10-CM | POA: Diagnosis not present

## 2014-05-24 DIAGNOSIS — I4891 Unspecified atrial fibrillation: Secondary | ICD-10-CM | POA: Diagnosis not present

## 2014-05-24 DIAGNOSIS — R600 Localized edema: Secondary | ICD-10-CM | POA: Diagnosis not present

## 2014-05-25 DIAGNOSIS — R600 Localized edema: Secondary | ICD-10-CM | POA: Diagnosis not present

## 2014-05-25 DIAGNOSIS — M179 Osteoarthritis of knee, unspecified: Secondary | ICD-10-CM | POA: Diagnosis not present

## 2014-05-25 DIAGNOSIS — I5032 Chronic diastolic (congestive) heart failure: Secondary | ICD-10-CM | POA: Diagnosis not present

## 2014-05-25 DIAGNOSIS — I4891 Unspecified atrial fibrillation: Secondary | ICD-10-CM | POA: Diagnosis not present

## 2014-05-27 DIAGNOSIS — I5032 Chronic diastolic (congestive) heart failure: Secondary | ICD-10-CM | POA: Diagnosis not present

## 2014-05-27 DIAGNOSIS — R600 Localized edema: Secondary | ICD-10-CM | POA: Diagnosis not present

## 2014-05-27 DIAGNOSIS — I4891 Unspecified atrial fibrillation: Secondary | ICD-10-CM | POA: Diagnosis not present

## 2014-05-27 DIAGNOSIS — M179 Osteoarthritis of knee, unspecified: Secondary | ICD-10-CM | POA: Diagnosis not present

## 2014-05-30 DIAGNOSIS — M179 Osteoarthritis of knee, unspecified: Secondary | ICD-10-CM | POA: Diagnosis not present

## 2014-05-30 DIAGNOSIS — R600 Localized edema: Secondary | ICD-10-CM | POA: Diagnosis not present

## 2014-05-30 DIAGNOSIS — I4891 Unspecified atrial fibrillation: Secondary | ICD-10-CM | POA: Diagnosis not present

## 2014-05-30 DIAGNOSIS — I5032 Chronic diastolic (congestive) heart failure: Secondary | ICD-10-CM | POA: Diagnosis not present

## 2014-05-31 DIAGNOSIS — R600 Localized edema: Secondary | ICD-10-CM | POA: Diagnosis not present

## 2014-05-31 DIAGNOSIS — I4891 Unspecified atrial fibrillation: Secondary | ICD-10-CM | POA: Diagnosis not present

## 2014-05-31 DIAGNOSIS — I5032 Chronic diastolic (congestive) heart failure: Secondary | ICD-10-CM | POA: Diagnosis not present

## 2014-05-31 DIAGNOSIS — M179 Osteoarthritis of knee, unspecified: Secondary | ICD-10-CM | POA: Diagnosis not present

## 2014-06-01 DIAGNOSIS — R600 Localized edema: Secondary | ICD-10-CM | POA: Diagnosis not present

## 2014-06-01 DIAGNOSIS — H35033 Hypertensive retinopathy, bilateral: Secondary | ICD-10-CM | POA: Diagnosis not present

## 2014-06-01 DIAGNOSIS — H26493 Other secondary cataract, bilateral: Secondary | ICD-10-CM | POA: Diagnosis not present

## 2014-06-01 DIAGNOSIS — H40013 Open angle with borderline findings, low risk, bilateral: Secondary | ICD-10-CM | POA: Diagnosis not present

## 2014-06-01 DIAGNOSIS — M179 Osteoarthritis of knee, unspecified: Secondary | ICD-10-CM | POA: Diagnosis not present

## 2014-06-01 DIAGNOSIS — H1859 Other hereditary corneal dystrophies: Secondary | ICD-10-CM | POA: Diagnosis not present

## 2014-06-01 DIAGNOSIS — I5032 Chronic diastolic (congestive) heart failure: Secondary | ICD-10-CM | POA: Diagnosis not present

## 2014-06-01 DIAGNOSIS — I4891 Unspecified atrial fibrillation: Secondary | ICD-10-CM | POA: Diagnosis not present

## 2014-06-02 DIAGNOSIS — M179 Osteoarthritis of knee, unspecified: Secondary | ICD-10-CM | POA: Diagnosis not present

## 2014-06-02 DIAGNOSIS — I5032 Chronic diastolic (congestive) heart failure: Secondary | ICD-10-CM | POA: Diagnosis not present

## 2014-06-02 DIAGNOSIS — R600 Localized edema: Secondary | ICD-10-CM | POA: Diagnosis not present

## 2014-06-02 DIAGNOSIS — I4891 Unspecified atrial fibrillation: Secondary | ICD-10-CM | POA: Diagnosis not present

## 2014-06-03 DIAGNOSIS — I5032 Chronic diastolic (congestive) heart failure: Secondary | ICD-10-CM | POA: Diagnosis not present

## 2014-06-03 DIAGNOSIS — M179 Osteoarthritis of knee, unspecified: Secondary | ICD-10-CM | POA: Diagnosis not present

## 2014-06-03 DIAGNOSIS — I4891 Unspecified atrial fibrillation: Secondary | ICD-10-CM | POA: Diagnosis not present

## 2014-06-03 DIAGNOSIS — R600 Localized edema: Secondary | ICD-10-CM | POA: Diagnosis not present

## 2014-06-06 DIAGNOSIS — I4891 Unspecified atrial fibrillation: Secondary | ICD-10-CM | POA: Diagnosis not present

## 2014-06-06 DIAGNOSIS — R600 Localized edema: Secondary | ICD-10-CM | POA: Diagnosis not present

## 2014-06-06 DIAGNOSIS — M179 Osteoarthritis of knee, unspecified: Secondary | ICD-10-CM | POA: Diagnosis not present

## 2014-06-06 DIAGNOSIS — I5032 Chronic diastolic (congestive) heart failure: Secondary | ICD-10-CM | POA: Diagnosis not present

## 2014-06-07 DIAGNOSIS — I4891 Unspecified atrial fibrillation: Secondary | ICD-10-CM | POA: Diagnosis not present

## 2014-06-07 DIAGNOSIS — M179 Osteoarthritis of knee, unspecified: Secondary | ICD-10-CM | POA: Diagnosis not present

## 2014-06-07 DIAGNOSIS — R600 Localized edema: Secondary | ICD-10-CM | POA: Diagnosis not present

## 2014-06-07 DIAGNOSIS — I5032 Chronic diastolic (congestive) heart failure: Secondary | ICD-10-CM | POA: Diagnosis not present

## 2014-06-08 DIAGNOSIS — M179 Osteoarthritis of knee, unspecified: Secondary | ICD-10-CM | POA: Diagnosis not present

## 2014-06-08 DIAGNOSIS — R600 Localized edema: Secondary | ICD-10-CM | POA: Diagnosis not present

## 2014-06-08 DIAGNOSIS — I5032 Chronic diastolic (congestive) heart failure: Secondary | ICD-10-CM | POA: Diagnosis not present

## 2014-06-08 DIAGNOSIS — I4891 Unspecified atrial fibrillation: Secondary | ICD-10-CM | POA: Diagnosis not present

## 2014-06-10 DIAGNOSIS — I5032 Chronic diastolic (congestive) heart failure: Secondary | ICD-10-CM | POA: Diagnosis not present

## 2014-06-10 DIAGNOSIS — I4891 Unspecified atrial fibrillation: Secondary | ICD-10-CM | POA: Diagnosis not present

## 2014-06-10 DIAGNOSIS — M179 Osteoarthritis of knee, unspecified: Secondary | ICD-10-CM | POA: Diagnosis not present

## 2014-06-10 DIAGNOSIS — R600 Localized edema: Secondary | ICD-10-CM | POA: Diagnosis not present

## 2014-06-13 DIAGNOSIS — I5032 Chronic diastolic (congestive) heart failure: Secondary | ICD-10-CM | POA: Diagnosis not present

## 2014-06-13 DIAGNOSIS — M179 Osteoarthritis of knee, unspecified: Secondary | ICD-10-CM | POA: Diagnosis not present

## 2014-06-13 DIAGNOSIS — I4891 Unspecified atrial fibrillation: Secondary | ICD-10-CM | POA: Diagnosis not present

## 2014-06-13 DIAGNOSIS — R600 Localized edema: Secondary | ICD-10-CM | POA: Diagnosis not present

## 2014-06-15 DIAGNOSIS — R600 Localized edema: Secondary | ICD-10-CM | POA: Diagnosis not present

## 2014-06-15 DIAGNOSIS — I4891 Unspecified atrial fibrillation: Secondary | ICD-10-CM | POA: Diagnosis not present

## 2014-06-15 DIAGNOSIS — M179 Osteoarthritis of knee, unspecified: Secondary | ICD-10-CM | POA: Diagnosis not present

## 2014-06-15 DIAGNOSIS — I5032 Chronic diastolic (congestive) heart failure: Secondary | ICD-10-CM | POA: Diagnosis not present

## 2014-06-16 DIAGNOSIS — M179 Osteoarthritis of knee, unspecified: Secondary | ICD-10-CM | POA: Diagnosis not present

## 2014-06-16 DIAGNOSIS — I5032 Chronic diastolic (congestive) heart failure: Secondary | ICD-10-CM | POA: Diagnosis not present

## 2014-06-16 DIAGNOSIS — I4891 Unspecified atrial fibrillation: Secondary | ICD-10-CM | POA: Diagnosis not present

## 2014-06-16 DIAGNOSIS — R600 Localized edema: Secondary | ICD-10-CM | POA: Diagnosis not present

## 2014-06-17 DIAGNOSIS — I5032 Chronic diastolic (congestive) heart failure: Secondary | ICD-10-CM | POA: Diagnosis not present

## 2014-06-17 DIAGNOSIS — I4891 Unspecified atrial fibrillation: Secondary | ICD-10-CM | POA: Diagnosis not present

## 2014-06-17 DIAGNOSIS — R600 Localized edema: Secondary | ICD-10-CM | POA: Diagnosis not present

## 2014-06-17 DIAGNOSIS — M179 Osteoarthritis of knee, unspecified: Secondary | ICD-10-CM | POA: Diagnosis not present

## 2014-06-19 ENCOUNTER — Other Ambulatory Visit: Payer: Self-pay | Admitting: Cardiology

## 2014-06-20 DIAGNOSIS — R600 Localized edema: Secondary | ICD-10-CM | POA: Diagnosis not present

## 2014-06-20 DIAGNOSIS — I5032 Chronic diastolic (congestive) heart failure: Secondary | ICD-10-CM | POA: Diagnosis not present

## 2014-06-20 DIAGNOSIS — I4891 Unspecified atrial fibrillation: Secondary | ICD-10-CM | POA: Diagnosis not present

## 2014-06-20 DIAGNOSIS — M179 Osteoarthritis of knee, unspecified: Secondary | ICD-10-CM | POA: Diagnosis not present

## 2014-06-21 DIAGNOSIS — I5032 Chronic diastolic (congestive) heart failure: Secondary | ICD-10-CM | POA: Diagnosis not present

## 2014-06-21 DIAGNOSIS — M179 Osteoarthritis of knee, unspecified: Secondary | ICD-10-CM | POA: Diagnosis not present

## 2014-06-21 DIAGNOSIS — I4891 Unspecified atrial fibrillation: Secondary | ICD-10-CM | POA: Diagnosis not present

## 2014-06-21 DIAGNOSIS — R600 Localized edema: Secondary | ICD-10-CM | POA: Diagnosis not present

## 2014-06-22 DIAGNOSIS — M179 Osteoarthritis of knee, unspecified: Secondary | ICD-10-CM | POA: Diagnosis not present

## 2014-06-22 DIAGNOSIS — R600 Localized edema: Secondary | ICD-10-CM | POA: Diagnosis not present

## 2014-06-22 DIAGNOSIS — I5032 Chronic diastolic (congestive) heart failure: Secondary | ICD-10-CM | POA: Diagnosis not present

## 2014-06-22 DIAGNOSIS — I4891 Unspecified atrial fibrillation: Secondary | ICD-10-CM | POA: Diagnosis not present

## 2014-06-23 DIAGNOSIS — M179 Osteoarthritis of knee, unspecified: Secondary | ICD-10-CM | POA: Diagnosis not present

## 2014-06-23 DIAGNOSIS — I4891 Unspecified atrial fibrillation: Secondary | ICD-10-CM | POA: Diagnosis not present

## 2014-06-23 DIAGNOSIS — R600 Localized edema: Secondary | ICD-10-CM | POA: Diagnosis not present

## 2014-06-23 DIAGNOSIS — I5032 Chronic diastolic (congestive) heart failure: Secondary | ICD-10-CM | POA: Diagnosis not present

## 2014-06-24 ENCOUNTER — Ambulatory Visit (INDEPENDENT_AMBULATORY_CARE_PROVIDER_SITE_OTHER): Payer: Medicare Other | Admitting: Family Medicine

## 2014-06-24 ENCOUNTER — Encounter: Payer: Self-pay | Admitting: Family Medicine

## 2014-06-24 VITALS — BP 135/77 | HR 83 | Temp 98.2°F | Ht 66.0 in | Wt 220.0 lb

## 2014-06-24 DIAGNOSIS — M1710 Unilateral primary osteoarthritis, unspecified knee: Secondary | ICD-10-CM

## 2014-06-24 DIAGNOSIS — R21 Rash and other nonspecific skin eruption: Secondary | ICD-10-CM

## 2014-06-24 DIAGNOSIS — R600 Localized edema: Secondary | ICD-10-CM | POA: Diagnosis not present

## 2014-06-24 DIAGNOSIS — I4891 Unspecified atrial fibrillation: Secondary | ICD-10-CM | POA: Diagnosis not present

## 2014-06-24 DIAGNOSIS — I10 Essential (primary) hypertension: Secondary | ICD-10-CM

## 2014-06-24 DIAGNOSIS — M179 Osteoarthritis of knee, unspecified: Secondary | ICD-10-CM

## 2014-06-24 DIAGNOSIS — I5032 Chronic diastolic (congestive) heart failure: Secondary | ICD-10-CM | POA: Diagnosis not present

## 2014-06-24 DIAGNOSIS — IMO0002 Reserved for concepts with insufficient information to code with codable children: Secondary | ICD-10-CM

## 2014-06-24 MED ORDER — NYSTATIN 100000 UNIT/GM EX OINT: 1.0000 "application " | TOPICAL_OINTMENT | Freq: Two times a day (BID) | CUTANEOUS | Status: DC

## 2014-06-24 NOTE — Assessment & Plan Note (Signed)
Well controlled. No changes for medications.

## 2014-06-24 NOTE — Progress Notes (Signed)
   Subjective:    Patient ID: Bianca [name], female    DOB: 1930/06/21, 81 y.o.   MRN: [id number]  HPI Bianca [name] is here for f/u.  HTN Disease Monitoring: Home BP Monitoring yes Chest pain- no    Dyspnea- no Medications:amlodipine, spiro, lasix  Compliance-  yes. Lightheadedness-  no  Edema- no   OA: hx of with pain in her knees. She is not able to walk a distance due to the pain.   Current Outpatient Prescriptions on File Prior to Visit  Medication Sig Dispense Refill  . acetaminophen (TYLENOL 8 HOUR) 650 MG CR tablet Take 1 tablet (650 mg total) by mouth every 8 (eight) hours as needed for pain. 30 tablet 3  . amLODipine-benazepril (LOTREL) 5-20 MG per capsule take 1 capsule by mouth once daily 90 capsule 3  . calcium-vitamin D (OSCAL WITH D 500-200) 500-200 MG-UNIT per tablet Take 1 tablet by mouth daily.      . Cholecalciferol (VITAMIN D-3) 1000 UNITS CAPS Take by mouth daily.    . clotrimazole-betamethasone (LOTRISONE) cream APPLY A GENEROUS AMOUNT TO AFFECTED AREA TWICE DAILY 90 g 2  . ELIQUIS 5 MG TABS tablet take 1 tablet by mouth twice a day 60 tablet 3  . fish oil-omega-3 fatty acids 1000 MG capsule Take 2 g by mouth daily.    . furosemide (LASIX) 40 MG tablet 1 and 1/2 tablets (60mg ) in the AM and 1 tablet (40mg ) around 4PM every day 90 tablet 4  . Multiple Vitamin (MULTIVITAMIN) tablet Take 1 tablet by mouth daily.      . nitroGLYCERIN (NITROSTAT) 0.4 MG SL tablet Place 1 tablet (0.4 mg total) under the tongue every 5 (five) minutes as needed for chest pain. 25 tablet 3  . ondansetron (ZOFRAN) 4 MG tablet Take 1 tablet (4 mg total) by mouth 2 (two) times daily. As needed for nausea 12 tablet 0  . spironolactone (ALDACTONE) 25 MG tablet take 1 tablet by mouth once daily 30 tablet 4   No current facility-administered medications on file prior to visit.    SHx: Jehovah's witness   Health Maintenance: needs zostavax  Review of Systems See HPI     Objective:   Physical Exam BP 135/77 mmHg  Pulse 83  Temp(Src) 98.2 F (36.8 C) (Oral)  Ht 5\' 6"  (1.676 m)  Wt 220 lb (99.791 kg)  BMI 35.53 kg/m2  Gen: NAD, alert, cooperative with exam, well-appearing CV: irreguarly irregular, good S1/S2, no murmur, trace edema Resp: CTABL, no wheezes, non-labored Skin: no rashes, normal turgor  Neuro: no gross deficits.       Assessment & Plan:

## 2014-06-24 NOTE — Patient Instructions (Signed)
Thank you for coming in,   I will look into getting you a rolling walker. Please give me a call if you don't hear from me.   Please bring all of your medications with you to each visit.    Please feel free to call with any questions or concerns at any time, at [phone]. --Dr. Raeford Razor  Diet Recommendations for Hypertension   Starchy (carb) foods include: Bread, rice, pasta, potatoes, corn, crackers, bagels, muffins, all baked goods.   Protein foods include: Meat, fish, poultry, eggs, dairy foods, and beans such as pinto and kidney beans (beans also provide carbohydrate).   1. Eat at least 3 meals and 1-2 snacks per day. Never go more than 4-5 hours while awake without eating.  2. Limit starchy foods to TWO per meal and ONE per snack. ONE portion of a starchy  food is equal to the following:   - ONE slice of bread (or its equivalent, such as half of a hamburger bun).   - 1/2 cup of a "scoopable" starchy food such as potatoes or rice.   - 1 OUNCE (28 grams) of starchy snack foods such as crackers or pretzels (look on label).   - 15 grams of carbohydrate as shown on food label.  3. Both lunch and dinner should include a protein food, a carb food, and vegetables.   - Obtain twice as many veg's as protein or carbohydrate foods for both lunch and dinner.   - Try to keep frozen veg's on hand for a quick vegetable serving.     - Fresh or frozen veg's are best.  4. Breakfast should always include protein.

## 2014-06-24 NOTE — Assessment & Plan Note (Signed)
Will try to obtain a rolling walker with a seat for her. She is wanting to walk more but also has a hx of being at risk for falls.

## 2014-06-27 ENCOUNTER — Encounter: Payer: Self-pay | Admitting: Family Medicine

## 2014-06-27 DIAGNOSIS — M179 Osteoarthritis of knee, unspecified: Secondary | ICD-10-CM | POA: Diagnosis not present

## 2014-06-27 DIAGNOSIS — I4891 Unspecified atrial fibrillation: Secondary | ICD-10-CM | POA: Diagnosis not present

## 2014-06-27 DIAGNOSIS — R600 Localized edema: Secondary | ICD-10-CM | POA: Diagnosis not present

## 2014-06-27 DIAGNOSIS — I5032 Chronic diastolic (congestive) heart failure: Secondary | ICD-10-CM | POA: Diagnosis not present

## 2014-06-28 DIAGNOSIS — I4891 Unspecified atrial fibrillation: Secondary | ICD-10-CM | POA: Diagnosis not present

## 2014-06-28 DIAGNOSIS — R600 Localized edema: Secondary | ICD-10-CM | POA: Diagnosis not present

## 2014-06-28 DIAGNOSIS — M179 Osteoarthritis of knee, unspecified: Secondary | ICD-10-CM | POA: Diagnosis not present

## 2014-06-28 DIAGNOSIS — I5032 Chronic diastolic (congestive) heart failure: Secondary | ICD-10-CM | POA: Diagnosis not present

## 2014-06-29 DIAGNOSIS — I5032 Chronic diastolic (congestive) heart failure: Secondary | ICD-10-CM | POA: Diagnosis not present

## 2014-06-29 DIAGNOSIS — R600 Localized edema: Secondary | ICD-10-CM | POA: Diagnosis not present

## 2014-06-29 DIAGNOSIS — I4891 Unspecified atrial fibrillation: Secondary | ICD-10-CM | POA: Diagnosis not present

## 2014-06-29 DIAGNOSIS — M179 Osteoarthritis of knee, unspecified: Secondary | ICD-10-CM | POA: Diagnosis not present

## 2014-06-30 DIAGNOSIS — M179 Osteoarthritis of knee, unspecified: Secondary | ICD-10-CM | POA: Diagnosis not present

## 2014-06-30 DIAGNOSIS — R600 Localized edema: Secondary | ICD-10-CM | POA: Diagnosis not present

## 2014-06-30 DIAGNOSIS — I5032 Chronic diastolic (congestive) heart failure: Secondary | ICD-10-CM | POA: Diagnosis not present

## 2014-06-30 DIAGNOSIS — I4891 Unspecified atrial fibrillation: Secondary | ICD-10-CM | POA: Diagnosis not present

## 2014-07-01 DIAGNOSIS — I5032 Chronic diastolic (congestive) heart failure: Secondary | ICD-10-CM | POA: Diagnosis not present

## 2014-07-01 DIAGNOSIS — M179 Osteoarthritis of knee, unspecified: Secondary | ICD-10-CM | POA: Diagnosis not present

## 2014-07-01 DIAGNOSIS — I4891 Unspecified atrial fibrillation: Secondary | ICD-10-CM | POA: Diagnosis not present

## 2014-07-01 DIAGNOSIS — R600 Localized edema: Secondary | ICD-10-CM | POA: Diagnosis not present

## 2014-07-06 ENCOUNTER — Telehealth: Payer: Self-pay | Admitting: Family Medicine

## 2014-07-06 NOTE — Telephone Encounter (Signed)
Wants to know status of her rollator

## 2014-07-06 NOTE — Telephone Encounter (Signed)
Pt is talking about a walker with wheels and a seat in the middle.  She says that MD told her to call back if she had not heard anything Programmer, systems, Bianca [name]

## 2014-07-08 NOTE — Telephone Encounter (Signed)
Script for rollator placed up front.   Rosemarie Ax, MD PGY-2, [facility] 07/08/2014, 12:35 PM

## 2014-07-08 NOTE — Telephone Encounter (Signed)
Spoke with patient and she is aware that this ready. Will plan to pick up on Monday. Jazmin Hartsell,CMA

## 2014-07-11 DIAGNOSIS — Z9181 History of falling: Secondary | ICD-10-CM | POA: Diagnosis not present

## 2014-07-16 ENCOUNTER — Other Ambulatory Visit: Payer: Self-pay | Admitting: Cardiology

## 2014-09-13 ENCOUNTER — Ambulatory Visit (INDEPENDENT_AMBULATORY_CARE_PROVIDER_SITE_OTHER): Payer: Medicare Other | Admitting: Family Medicine

## 2014-09-13 ENCOUNTER — Encounter: Payer: Self-pay | Admitting: Family Medicine

## 2014-09-13 VITALS — BP 106/64 | HR 90 | Temp 98.6°F | Ht 66.0 in | Wt 218.0 lb

## 2014-09-13 DIAGNOSIS — L0292 Furuncle, unspecified: Secondary | ICD-10-CM | POA: Diagnosis not present

## 2014-09-13 MED ORDER — DOXYCYCLINE HYCLATE 100 MG PO TABS: 100.0000 mg | ORAL_TABLET | Freq: Two times a day (BID) | ORAL | Status: DC

## 2014-09-13 NOTE — Progress Notes (Signed)
Patient ID: Bianca [name], female   DOB: July 05, 1930, 81 y.o.   MRN: [id number]    Subjective: CC: boil HPI: Patient is a 81 y.o. female with a past medical history of boils presenting to clinic today for a boil in her groin.  Patient noticed a boil in her R groin for approximately 2 weeks where "the underwear" sit. She states that she's been using ht water, fat-back meat, and salt in hopes of bringing it to a head and it would rupture. No drainage that she has noticed. She has also been putting triple antibiotic and nystatin ointment on it as well.  She noted some improvement but then some worsening over the weekend. Endorses some nausea and pain. No fevers, rigors, myalgias, or arthralgias.   Patient is on Eliquis for atrial fibrillation. No h/o clots.  Social History: former smoker   ROS: All other systems reviewed and are negative.  Past Medical History Patient Active Problem List   Diagnosis Date Noted  . Furuncle 09/13/2014  . At high risk for falls 05/11/2012  . Long term (current) use of anticoagulants 08/15/2010  . ARTHRITIS, SHOULDERS, BILATERAL 09/08/2008  . Osteoarthrosis, unspecified whether generalized or localized, involving lower leg 10/28/2007  . OBESITY, MODERATE 06/04/2007  . DEPRESSIVE DISORDER 06/04/2007  . BENIGN NEOPLASM OF COLON 04/06/2007  . Atrial fibrillation 12/08/2006  . Chronic diastolic heart failure 76/16/0737  . HYPERTENSION, BENIGN ESSENTIAL 04/23/2006    Medications- reviewed and updated Current Outpatient Prescriptions  Medication Sig Dispense Refill  . acetaminophen (TYLENOL 8 HOUR) 650 MG CR tablet Take 1 tablet (650 mg total) by mouth every 8 (eight) hours as needed for pain. 30 tablet 3  . amLODipine-benazepril (LOTREL) 5-20 MG per capsule take 1 capsule by mouth once daily 90 capsule 3  . calcium-vitamin D (OSCAL WITH D 500-200) 500-200 MG-UNIT per tablet Take 1 tablet by mouth daily.      . Cholecalciferol (VITAMIN D-3) 1000 UNITS CAPS  Take by mouth daily.    Marland Kitchen doxycycline (VIBRA-TABS) 100 MG tablet Take 1 tablet (100 mg total) by mouth 2 (two) times daily. 20 tablet 0  . ELIQUIS 5 MG TABS tablet take 1 tablet by mouth twice a day 60 tablet 3  . fish oil-omega-3 fatty acids 1000 MG capsule Take 2 g by mouth daily.    . furosemide (LASIX) 40 MG tablet 1 and 1/2 tablets (60mg ) in the AM and 1 tablet (40mg ) around 4PM every day 90 tablet 4  . Multiple Vitamin (MULTIVITAMIN) tablet Take 1 tablet by mouth daily.      . nitroGLYCERIN (NITROSTAT) 0.4 MG SL tablet Place 1 tablet (0.4 mg total) under the tongue every 5 (five) minutes as needed for chest pain. 25 tablet 3  . nystatin ointment (MYCOSTATIN) Apply 1 application topically 2 (two) times daily. 30 g 3  . ondansetron (ZOFRAN) 4 MG tablet Take 1 tablet (4 mg total) by mouth 2 (two) times daily. As needed for nausea 12 tablet 0  . spironolactone (ALDACTONE) 25 MG tablet take 1 tablet by mouth once daily 30 tablet 11   No current facility-administered medications for this visit.    Objective: Office vital signs reviewed. BP 106/64 mmHg  Pulse 90  Temp(Src) 98.6 F (37 C)  Ht 5\' 6"  (1.676 m)  Wt 218 lb (98.884 kg)  BMI 35.20 kg/m2   Physical Examination:  General: Awake, alert, well- nourished, in some mild pain with sitting and changing position Skin: Exquisitely tender to palpation.  Approximately 5.5 cm x 2 cm region of induration in the inguinal fold on the right with some extension into the perineum. There is a 2x46mm region opening centrally located.  There is a not a large amount of fluctuance, however I am able to express a small amount of thick, brown drainage. Some mile erythema over the region of induration, however none surrounding.     Assessment/Plan: Furuncle Patient presenting with what she stated started out as a small boil. On exam, she's noted to have a mild case of cellulitis with some purulent drainage. Given the location, concern for loculation, and  concern for Eliquis use, I am hesitant to I&D this in clinic today. - Doxycycline 100mg  BID x 10 days  - Warm compresses several times per day  - Refer to general surgery for evaluation- I believe the benefit outweighs the risk if Eliquis needs to be stopped for a procedure in the future. - Strict return to clinic precautions discussed such as worsening pain, fevers, or chills.    Patient was evaluated and the A&P was discussed with attending, Dr. Lindell Noe.    Orders Placed This Encounter  Procedures  . Ambulatory referral to General Surgery    Referral Priority:  Urgent    Referral Type:  Surgical    Referral Reason:  Specialty Services Required    Requested Specialty:  General Surgery    Number of Visits Requested:  1    Meds ordered this encounter  Medications  . doxycycline (VIBRA-TABS) 100 MG tablet    Sig: Take 1 tablet (100 mg total) by mouth 2 (two) times daily.    Dispense:  20 tablet    Refill:  [place] PGY-2, [facility]

## 2014-09-13 NOTE — Assessment & Plan Note (Signed)
Patient presenting with what she stated started out as a small boil. On exam, she's noted to have a mild case of cellulitis with some purulent drainage. Given the location, concern for loculation, and concern for Eliquis use, I am hesitant to I&D this in clinic today. - Doxycycline 100mg  BID x 10 days  - Warm compresses several times per day  - Refer to general surgery for evaluation- I believe the benefit outweighs the risk if Eliquis needs to be stopped for a procedure in the future. - Strict return to clinic precautions discussed such as worsening pain, fevers, or chills.

## 2014-09-13 NOTE — Patient Instructions (Addendum)
I have prescribed you doxycycline, an antibiotic, for the infection Given the location, I have referred you to general surgery  Please continue to use warm compresses several times per day to help with the drainage  If the pain acute worsens or you develop fevers or chills, please seek medical care.  Abscess An abscess is an infected area that contains a collection of pus and debris.It can occur in almost any part of the body. An abscess is also known as a furuncle or boil. CAUSES  An abscess occurs when tissue gets infected. This can occur from blockage of oil or sweat glands, infection of hair follicles, or a minor injury to the skin. As the body tries to fight the infection, pus collects in the area and creates pressure under the skin. This pressure causes pain. People with weakened immune systems have difficulty fighting infections and get certain abscesses more often.  SYMPTOMS Usually an abscess develops on the skin and becomes a painful mass that is red, warm, and tender. If the abscess forms under the skin, you may feel a moveable soft area under the skin. Some abscesses break open (rupture) on their own, but most will continue to get worse without care. The infection can spread deeper into the body and eventually into the bloodstream, causing you to feel ill.  DIAGNOSIS  Your caregiver will take your medical history and perform a physical exam. A sample of fluid may also be taken from the abscess to determine what is causing your infection. TREATMENT  Your caregiver may prescribe antibiotic medicines to fight the infection. However, taking antibiotics alone usually does not cure an abscess. Your caregiver may need to make a small cut (incision) in the abscess to drain the pus. In some cases, gauze is packed into the abscess to reduce pain and to continue draining the area. HOME CARE INSTRUCTIONS   Only take over-the-counter or prescription medicines for pain, discomfort, or fever as  directed by your caregiver.  If you were prescribed antibiotics, take them as directed. Finish them even if you start to feel better.  If gauze is used, follow your caregiver's directions for changing the gauze.  To avoid spreading the infection:  Keep your draining abscess covered with a bandage.  Wash your hands well.  Do not share personal care items, towels, or whirlpools with others.  Avoid skin contact with others.  Keep your skin and clothes clean around the abscess.  Keep all follow-up appointments as directed by your caregiver. SEEK MEDICAL CARE IF:   You have increased pain, swelling, redness, fluid drainage, or bleeding.  You have muscle aches, chills, or a general ill feeling.  You have a fever. MAKE SURE YOU:   Understand these instructions.  Will watch your condition.  Will get help right away if you are not doing well or get worse. Document Released: 10/10/2004 Document Revised: 07/02/2011 Document Reviewed: 03/15/2011 [facility] Patient Information 2015 [place], [place]. This information is not intended to replace advice given to you by your health care provider. Make sure you discuss any questions you have with your health care provider.

## 2014-09-26 ENCOUNTER — Ambulatory Visit (INDEPENDENT_AMBULATORY_CARE_PROVIDER_SITE_OTHER): Payer: Medicare Other | Admitting: Physician Assistant

## 2014-09-26 ENCOUNTER — Encounter: Payer: Self-pay | Admitting: Physician Assistant

## 2014-09-26 VITALS — BP 160/82 | HR 67 | Ht 66.0 in | Wt 224.0 lb

## 2014-09-26 DIAGNOSIS — E669 Obesity, unspecified: Secondary | ICD-10-CM | POA: Diagnosis not present

## 2014-09-26 DIAGNOSIS — I10 Essential (primary) hypertension: Secondary | ICD-10-CM | POA: Diagnosis not present

## 2014-09-26 DIAGNOSIS — I4819 Other persistent atrial fibrillation: Secondary | ICD-10-CM

## 2014-09-26 DIAGNOSIS — I5032 Chronic diastolic (congestive) heart failure: Secondary | ICD-10-CM

## 2014-09-26 DIAGNOSIS — I481 Persistent atrial fibrillation: Secondary | ICD-10-CM | POA: Diagnosis not present

## 2014-09-26 NOTE — Patient Instructions (Addendum)
Medication Instructions:  Your physician has recommended you make the following change in your medication:  1.  TONIGHT take 1 1/2 tablet of Lasix 40 mg 2.  Tomorrow take 2 tablets of Lasix 40 mg in the morning and then go back to your normal dosing of 40 mg taking 1 1/2 in the morning and 1 tablet around 4 p.m.   Labwork: None ordered  Testing/Procedures: None ordered  Follow-Up: Your physician recommends that you keep your scheduled follow-up appointment with Dr. Aundra Dubin on 12-21-14 @ 2:30.

## 2014-09-26 NOTE — Assessment & Plan Note (Signed)
Rate controlled without rate lowering medications. Patient is on Eliquis.

## 2014-09-26 NOTE — Progress Notes (Addendum)
Cardiology Office Note   Date:  09/26/2014   ID:  Bianca [name], DOB 07/24/1930, MRN [id number]  PCP:  Clearance Coots, MD  Cardiologist:  Dr. Aundra Dubin  Chief Complaint: Follow-up    History of Present Illness: Bianca [name] is a 81 y.o. female who presents for follow-up of atrial fibrillation and heart failure. She has a history of chronic atrial fibrillation and diastolic heart failure. Last 2-D echo 11/2012 EF 60-65% with mild LVH, moderate dilated RV with mildly decreased RV systolic function. Lexi scan in 10/2012 showed a medium sized inferior and inferoseptal fixed defect no ischemia..  Patient denies any chest pain, palpitations, dizziness or presyncope. She has chronic dyspnea on exertion with little activity. She was added [employer] conference all day yesterday and did not take her Lasix. She does have chronic mild edema.    Past Medical History  Diagnosis Date  . Hypertension   . Atrial fibrillation   . Osteoarthritis   . GERD (gastroesophageal reflux disease)   . Long term current use of anticoagulant   . Chronic diastolic heart failure     Echo 02/2008: EF 55%, MAC, mild MR, moderate BAE  . Obesity   . Refusal of blood transfusions as patient is [employer]   . Hx of cardiovascular stress test     Lexiscan Myoview (10/14):  Fixed inf defect, no ischemia, study not gated  . Hx of echocardiogram     Echo (11/14):  Mild LVH, EF 60-65%, mod to severe LAE, mod RVE, mild to mod reduced RVSF, mild RAE, PASP 38    Past Surgical History  Procedure Laterality Date  . Cholecystectomy    . Abdominal hysterectomy    . Tubal ligation    . Knee arthroscopy Bilateral   . Carpal tunnel release       Current Outpatient Prescriptions  Medication Sig Dispense Refill  . acetaminophen (TYLENOL 8 HOUR) 650 MG CR tablet Take 1 tablet (650 mg total) by mouth every 8 (eight) hours as needed for pain. 30 tablet 3  . amLODipine-benazepril (LOTREL) 5-20 MG per capsule take 1  capsule by mouth once daily 90 capsule 3  . calcium-vitamin D (OSCAL WITH D 500-200) 500-200 MG-UNIT per tablet Take 1 tablet by mouth daily.      . Cholecalciferol (VITAMIN D-3) 1000 UNITS CAPS Take by mouth daily.    Marland Kitchen ELIQUIS 5 MG TABS tablet take 1 tablet by mouth twice a day 60 tablet 3  . fish oil-omega-3 fatty acids 1000 MG capsule Take 2 g by mouth daily.    . furosemide (LASIX) 40 MG tablet 1 and 1/2 tablets (60mg ) in the AM and 1 tablet (40mg ) around 4PM every day 90 tablet 4  . Multiple Vitamin (MULTIVITAMIN) tablet Take 1 tablet by mouth daily.      . nitroGLYCERIN (NITROSTAT) 0.4 MG SL tablet Place 1 tablet (0.4 mg total) under the tongue every 5 (five) minutes as needed for chest pain. 25 tablet 3  . nystatin ointment (MYCOSTATIN) Apply 1 application topically 2 (two) times daily. 30 g 3  . ondansetron (ZOFRAN) 4 MG tablet Take 1 tablet (4 mg total) by mouth 2 (two) times daily. As needed for nausea 12 tablet 0  . spironolactone (ALDACTONE) 25 MG tablet take 1 tablet by mouth once daily 30 tablet 11   No current facility-administered medications for this visit.    Allergies:   Hydrocodone; Morphine; and Penicillins    Social History:  The patient  reports that she has quit smoking. She does not have any smokeless tobacco history on file. She reports that she does not drink alcohol or use illicit drugs.   Family History:  The patient's    family history includes Asthma in her mother; Colon cancer in her mother; Depression in her daughter; Diabetes in an other family member; Hypertension in an other family member. There is no history of Heart attack.    ROS:  Please see the history of present illness.   Otherwise, review of systems are positive for constipation, easy bruising.   All other systems are reviewed and negative.    PHYSICAL EXAM: VS:  BP 160/82 mmHg  Pulse 67  Ht 5\' 6"  (1.676 m)  Wt 224 lb (101.606 kg)  BMI 36.17 kg/m2 , BMI Body mass index is 36.17 kg/(m^2).  repeat blood pressure 135/80 GEN: Well nourished, well developed, in no acute distress Neck: Increased JVD, HJR, no carotid bruits, or masses Cardiac: Irregular irregular; 1/6 systolic murmur at the left sternal border, no gallop, rubs, thrill or heave,  Respiratory:  Decreased breath sounds but clear to auscultation bilaterally, normal work of breathing GI: soft, nontender, nondistended, + BS MS: no deformity or atrophy Extremities: Trace of ankle edema bilaterally otherwise without cyanosis, clubbing,  good distal pulses bilaterally.  Skin: warm and dry, no rash Neuro:  Strength and sensation are intact    EKG:  EKG is not ordered today.    Recent Labs: 03/21/2014: BUN 16; Creatinine, Ser 0.77; Hemoglobin 13.1; Platelets 208.0; Potassium 4.4; Pro B Natriuretic peptide (BNP) 183.0*; Sodium 138    Lipid Panel    Component Value Date/Time   CHOL 161 01/19/2013 1002   TRIG 101.0 01/19/2013 1002   HDL 40.00 01/19/2013 1002   CHOLHDL 4 01/19/2013 1002   VLDL 20.2 01/19/2013 1002   LDLCALC 101* 01/19/2013 1002   LDLDIRECT 119* 06/14/2009 1819      Wt Readings from Last 3 Encounters:  09/26/14 224 lb (101.606 kg)  09/13/14 218 lb (98.884 kg)  06/24/14 220 lb (99.791 kg)      Other studies Reviewed: Additional studies/ records that were reviewed today include and review of the records demonstrates: Labs reviewed from March and are stable. 2-D echo 2014Study Conclusions  - Left ventricle: The cavity size was normal. There was mild   concentric hypertrophy. Systolic function was normal. The   estimated ejection fraction was in the range of 60% to   65%. Wall motion was normal; there were no regional wall   motion abnormalities. The study is not technically   sufficient to allow evaluation of LV diastolic function. - Left atrium: The atrium was moderately to severely   dilated. - Right ventricle: The cavity size was moderately dilated.   Systolic function was mildly to  moderately reduced. - Right atrium: The atrium was mildly dilated. - Atrial septum: No defect or patent foramen ovale was   identified. - Pulmonary arteries: PA peak pressure: 80mm Hg (S).   ASSESSMENT AND PLAN: HYPERTENSION, BENIGN ESSENTIAL Patient's blood pressure was elevated when she first got here but she had taken her medications right before she left. When I rechecked it was 135/80. She takes it at home and it's been quite stable. Pressure is low at her primary care last week. She missed her Lasix yesterday. I will not make any permanent medication adjustments.  Atrial fibrillation Rate controlled without rate lowering medications. Patient is on Eliquis.  Chronic diastolic heart failure Patient has evidence  of mild fluid overload on exam today. She missed her Lasix yesterday. I advised her to take 60 mg this evening and 80 mg tomorrow morning then resume regular doses. Call for weight gain or increased edema or dyspnea. Follow-up with Dr. Marigene Ehlers in December as scheduled.  Obesity Patient was doing weight watchers but has stopped. Recommend continuing weight loss program.     Sumner Boast, PA-C  09/26/2014 9:29 AM    [address], [place], [place]  [address] Phone: [phone]; Fax: [phone]

## 2014-09-26 NOTE — Assessment & Plan Note (Signed)
Patient's blood pressure was elevated when she first got here but she had taken her medications right before she left. When I rechecked it was 135/80. She takes it at home and it's been quite stable. Pressure is low at her primary care last week. She missed her Lasix yesterday. I will not make any permanent medication adjustments.

## 2014-09-26 NOTE — Assessment & Plan Note (Signed)
Patient has evidence of mild fluid overload on exam today. She missed her Lasix yesterday. I advised her to take 60 mg this evening and 80 mg tomorrow morning then resume regular doses. Call for weight gain or increased edema or dyspnea. Follow-up with Dr. Marigene Ehlers in December as scheduled.

## 2014-09-26 NOTE — Assessment & Plan Note (Signed)
Patient was doing weight watchers but has stopped. Recommend continuing weight loss program.

## 2014-10-21 ENCOUNTER — Other Ambulatory Visit: Payer: Self-pay | Admitting: Cardiology

## 2014-11-24 ENCOUNTER — Other Ambulatory Visit: Payer: Self-pay | Admitting: *Deleted

## 2014-11-24 DIAGNOSIS — R21 Rash and other nonspecific skin eruption: Secondary | ICD-10-CM

## 2014-12-13 ENCOUNTER — Encounter: Payer: Self-pay | Admitting: Family Medicine

## 2014-12-13 ENCOUNTER — Ambulatory Visit (INDEPENDENT_AMBULATORY_CARE_PROVIDER_SITE_OTHER): Payer: Medicare Other | Admitting: Family Medicine

## 2014-12-13 VITALS — BP 123/68 | HR 71 | Temp 97.7°F | Wt 225.8 lb

## 2014-12-13 DIAGNOSIS — R5383 Other fatigue: Secondary | ICD-10-CM

## 2014-12-13 DIAGNOSIS — R202 Paresthesia of skin: Secondary | ICD-10-CM | POA: Diagnosis not present

## 2014-12-13 LAB — TSH: TSH: 0.565 u[IU]/mL (ref 0.350–4.500)

## 2014-12-13 LAB — VITAMIN B12: VITAMIN B 12: 547 pg/mL (ref 211–911)

## 2014-12-13 LAB — POCT GLYCOSYLATED HEMOGLOBIN (HGB A1C): HEMOGLOBIN A1C: 5.7

## 2014-12-13 MED ORDER — GABAPENTIN 100 MG PO CAPS: 100.0000 mg | ORAL_CAPSULE | Freq: Every day | ORAL | Status: DC

## 2014-12-13 NOTE — Assessment & Plan Note (Signed)
Occurring on her anterior tibia originating at her knee and radiating distally to the ankle on bilateral lower extremities. Denies any other symptoms and can't quite explain it on what she feels. Doesn't appear to have any kind of neurological deficits and no foot drop. May be a component of osteoarthritis versus sensational changes status post knee scope (although surgery was done sometime ago)  - Lab work pending - We'll try gabapentin 100 mg daily at bedtime.  - If no improvement with gabapentin will need to order some imaging and may repeat need to refer to orthopedic surgery for synvisc injections

## 2014-12-13 NOTE — Progress Notes (Signed)
Subjective:    Bianca [name] - 81 y.o. female MRN [id number]  Date of birth: 07/25/1930  HPI  Bianca [name] is here for leg sensation changes.  Lower extremity sensation changes:  Has been occuring for 3-4 months.  Happens every day and resolves with a rub.  It is occurring on the anterior tibia.  Its starts at your knees and radiates distally.  Hurts more in her knees when she walks.  Feeling occurs when she isn't walking  She has had arthroscopic surgery on both knees in 2001. She is stiff in the morning and unsteady in the morning.   Imaging in 2001.  She has received the hyaluronic acid injections about 10 -12 months.  She goes to [facility] Maintenance:  Health Maintenance Due  Topic Date Due  . ZOSTAVAX  04/16/1992  . PNA vac Low Risk Adult (2 of 2 - PCV13) 11/15/1998  . COLONOSCOPY  09/18/2013    -  reports that she has quit smoking. She does not have any smokeless tobacco history on file. - Review of Systems: Per HPI. - Past Medical History: Patient Active Problem List   Diagnosis Date Noted  . Bilateral leg paresthesia 12/13/2014  . Furuncle 09/13/2014  . At high risk for falls 05/11/2012  . Long term (current) use of anticoagulants 08/15/2010  . ARTHRITIS, SHOULDERS, BILATERAL 09/08/2008  . Osteoarthrosis, unspecified whether generalized or localized, involving lower leg 10/28/2007  . Obesity 06/04/2007  . DEPRESSIVE DISORDER 06/04/2007  . BENIGN NEOPLASM OF COLON 04/06/2007  . Atrial fibrillation ([place]) 12/08/2006  . Chronic diastolic heart failure ([place]) 12/08/2006  . HYPERTENSION, BENIGN ESSENTIAL 04/23/2006   - Medications: reviewed and updated Current Outpatient Prescriptions  Medication Sig Dispense Refill  . acetaminophen (TYLENOL 8 HOUR) 650 MG CR tablet Take 1 tablet (650 mg total) by mouth every 8 (eight) hours as needed for pain. 30 tablet 3  . amLODipine-benazepril (LOTREL) 5-20 MG per capsule take 1 capsule by mouth once daily 90  capsule 3  . calcium-vitamin D (OSCAL WITH D 500-200) 500-200 MG-UNIT per tablet Take 1 tablet by mouth daily.      . Cholecalciferol (VITAMIN D-3) 1000 UNITS CAPS Take by mouth daily.    Marland Kitchen ELIQUIS 5 MG TABS tablet take 1 tablet by mouth twice a day 60 tablet 3  . fish oil-omega-3 fatty acids 1000 MG capsule Take 2 g by mouth daily.    . furosemide (LASIX) 40 MG tablet 1 and 1/2 tablets (60mg ) in the AM and 1 tablet (40mg ) around 4PM every day 90 tablet 4  . gabapentin (NEURONTIN) 100 MG capsule Take 1 capsule (100 mg total) by mouth at bedtime. 30 capsule 1  . Multiple Vitamin (MULTIVITAMIN) tablet Take 1 tablet by mouth daily.      . nitroGLYCERIN (NITROSTAT) 0.4 MG SL tablet Place 1 tablet (0.4 mg total) under the tongue every 5 (five) minutes as needed for chest pain. 25 tablet 3  . nystatin ointment (MYCOSTATIN) Apply 1 application topically 2 (two) times daily. 30 g 3  . ondansetron (ZOFRAN) 4 MG tablet Take 1 tablet (4 mg total) by mouth 2 (two) times daily. As needed for nausea 12 tablet 0  . spironolactone (ALDACTONE) 25 MG tablet take 1 tablet by mouth once daily 30 tablet 11   No current facility-administered medications for this visit.     Review of Systems See HPI     Objective:   Physical Exam BP 123/68 mmHg  Pulse 71  Temp(Src) 97.7 F (36.5 C) (Oral)  Wt 225 lb 12.8 oz (102.422 kg) Gen: NAD, alert, cooperative with exam,  HEENT: NCAT, clear conjunctiva,  CV: Regular rate Resp:  non-labored Skin: no rashes, normal turgor  Neuro: no gross deficits.  Psych:  alert and oriented Lower Extremity/Knee Exam:  Laterality: Bilateral Appearance: No obvious deformity Edema: Mild swelling  Tenderness: No tenderness to palpation Range of Motion: Normal flexion and extension, normal plantar and dorsiflexion, normal eversion and inversion Strength:  Quadricep: 5/5 Hamstring: 5/5 Gait: normal Neurovascularly intact     Assessment & Plan:   Bilateral leg  paresthesia Occurring on her anterior tibia originating at her knee and radiating distally to the ankle on bilateral lower extremities. Denies any other symptoms and can't quite explain it on what she feels. Doesn't appear to have any kind of neurological deficits and no foot drop. May be a component of osteoarthritis versus sensational changes status post knee scope (although surgery was done sometime ago)  - Lab work pending - We'll try gabapentin 100 mg daily at bedtime.  - If no improvement with gabapentin will need to order some imaging and may repeat need to refer to orthopedic surgery for synvisc injections

## 2014-12-13 NOTE — Patient Instructions (Signed)
Thank you for coming in,   He can take melatonin. Take it 30 minutes prior to going to bed. I provided information about sleep hygiene below.   I have prescribed gabapentin to be taken at night which may help.  I will call or send a letter with results from today.  Please bring all of your medications with you to each visit.   Sign up for My Chart to have easy access to your labs results, and communication with your Primary care physician   Please feel free to call with any questions or concerns at any time, at [phone]. --Dr. Raeford Razor  What are some examples of good sleep hygiene?  The most important sleep hygiene measure is to maintain a regular wake and sleep pattern seven days a week. It is also important to spend an appropriate amount of time in bed, not too little, or too excessive. This may vary by individual; for example, if someone has a problem with daytime sleepiness, they should spend a minimum of eight hours in bed, if they have difficulty sleeping at night, they should limit themselves to 7 hours in bed in order to keep the sleep pattern consolidated. In addition, good sleep hygiene practices include:  Avoid napping during the day. It can disturb the normal pattern of sleep and wakefulness. Avoid stimulants such as caffeine, nicotine, and alcohol too close to bedtime. While alcohol is well known to speed the onset of sleep, it disrupts sleep in the second half as the body begins to metabolize the alcohol, causing arousal. Exercise can promote good sleep. Vigorous exercise should be taken in the morning or late afternoon. A relaxing exercise, like yoga, can be done before bed to help initiate a restful night's sleep. Food can be disruptive right before sleep.  Stay away from large meals close to bedtime. Also dietary changes can cause sleep problems, if someone is struggling with a sleep problem, it's not a good time to start experimenting with spicy dishes. And, remember,  chocolate has caffeine. Ensure adequate exposure to natural light. This is particularly important for older people who may not venture outside frequently. Light exposure helps maintain a healthy sleep-wake cycle. Establish a regular relaxing bedtime routine. Try to avoid emotionally upsetting conversations and activities before trying to go to sleep. Don't dwell on, or bring your problems to bed. Associate your bed with sleep. It's not a good idea to use your bed to watch TV, listen to the radio, or read. Make sure that the sleep environment is pleasant and relaxing. The bed should be comfortable, the room should not be too hot or cold, or too bright.

## 2014-12-14 ENCOUNTER — Encounter: Payer: Self-pay | Admitting: Family Medicine

## 2014-12-14 LAB — VITAMIN D 25 HYDROXY (VIT D DEFICIENCY, FRACTURES): Vit D, 25-Hydroxy: 40 ng/mL (ref 30–100)

## 2014-12-21 ENCOUNTER — Encounter: Payer: Self-pay | Admitting: Cardiology

## 2014-12-21 ENCOUNTER — Ambulatory Visit (INDEPENDENT_AMBULATORY_CARE_PROVIDER_SITE_OTHER): Payer: Medicare Other | Admitting: Cardiology

## 2014-12-21 VITALS — BP 142/72 | HR 79 | Ht 66.0 in | Wt 225.0 lb

## 2014-12-21 DIAGNOSIS — I4819 Other persistent atrial fibrillation: Secondary | ICD-10-CM

## 2014-12-21 DIAGNOSIS — I5032 Chronic diastolic (congestive) heart failure: Secondary | ICD-10-CM | POA: Diagnosis not present

## 2014-12-21 DIAGNOSIS — I10 Essential (primary) hypertension: Secondary | ICD-10-CM | POA: Diagnosis not present

## 2014-12-21 DIAGNOSIS — I481 Persistent atrial fibrillation: Secondary | ICD-10-CM

## 2014-12-21 LAB — BASIC METABOLIC PANEL
BUN: 21 mg/dL (ref 7–25)
CALCIUM: 10.1 mg/dL (ref 8.6–10.4)
CO2: 29 mmol/L (ref 20–31)
CREATININE: 0.86 mg/dL (ref 0.60–0.88)
Chloride: 104 mmol/L (ref 98–110)
Glucose, Bld: 133 mg/dL — ABNORMAL HIGH (ref 65–99)
Potassium: 3.5 mmol/L (ref 3.5–5.3)
Sodium: 141 mmol/L (ref 135–146)

## 2014-12-21 LAB — CBC WITH DIFFERENTIAL/PLATELET
BASOS PCT: 0 % (ref 0–1)
Basophils Absolute: 0 10*3/uL (ref 0.0–0.1)
Eosinophils Absolute: 0.1 10*3/uL (ref 0.0–0.7)
Eosinophils Relative: 1 % (ref 0–5)
HCT: 37.1 % (ref 36.0–46.0)
HEMOGLOBIN: 11.8 g/dL — AB (ref 12.0–15.0)
Lymphocytes Relative: 48 % — ABNORMAL HIGH (ref 12–46)
Lymphs Abs: 3.4 10*3/uL (ref 0.7–4.0)
MCH: 28 pg (ref 26.0–34.0)
MCHC: 31.8 g/dL (ref 30.0–36.0)
MCV: 87.9 fL (ref 78.0–100.0)
MONO ABS: 0.6 10*3/uL (ref 0.1–1.0)
MPV: 11.9 fL (ref 8.6–12.4)
Monocytes Relative: 8 % (ref 3–12)
NEUTROS ABS: 3.1 10*3/uL (ref 1.7–7.7)
NEUTROS PCT: 43 % (ref 43–77)
Platelets: 218 10*3/uL (ref 150–400)
RBC: 4.22 MIL/uL (ref 3.87–5.11)
RDW: 15.2 % (ref 11.5–15.5)
WBC: 7.1 10*3/uL (ref 4.0–10.5)

## 2014-12-21 LAB — LIPID PANEL
CHOL/HDL RATIO: 3.6 ratio (ref <=5.0)
CHOLESTEROL: 143 mg/dL (ref 125–200)
HDL: 40 mg/dL — ABNORMAL LOW (ref >=46)
LDL Cholesterol: 80 mg/dL (ref <130)
Triglycerides: 114 mg/dL (ref <150)
VLDL: 23 mg/dL (ref <30)

## 2014-12-21 MED ORDER — FUROSEMIDE 40 MG PO TABS: ORAL_TABLET | ORAL | Status: DC

## 2014-12-21 NOTE — Patient Instructions (Signed)
Medication Instructions:  Increase lasix (furosemide) to 60mg  two times a day. This will be 1 and 1/2 of a 40mg  tablet two times a day.   Labwork: BMET/CBCd/BNP/Lipid profile today  Testing/Procedures: None today  Follow-Up: Your physician wants you to follow-up in: 6 months with Dr Aundra Dubin. (June 2017).  You will receive a reminder letter in the mail two months in advance. If you don't receive a letter, please call our office to schedule the follow-up appointment.        If you need a refill on your cardiac medications before your next appointment, please call your pharmacy.

## 2014-12-22 LAB — BRAIN NATRIURETIC PEPTIDE: BRAIN NATRIURETIC PEPTIDE: 100.8 pg/mL — AB (ref 0.0–100.0)

## 2014-12-22 NOTE — Progress Notes (Signed)
Patient ID: Bianca [name], female   DOB: 1930/05/16, 81 y.o.   MRN: [id number] PCP: Dr. Raeford Razor  81 yo with history of permanent atrial fibrillation and diastolic CHF presents for cardiology followup.   She lives alone.  She continues to drive and does her own shopping, etc.  No chest pain.  No dyspnea walking short distances, dyspnea with longer distances (walking to mailbox).  She is short of breath making up bed or walking up steps.  No orthopnea, PND, falls, or syncope/lightheadedness. Weight is down 5 lbs.  She has low back pain with sciatica and knee OA.  No melena or BRBPR.   ECG: Atrial fibrillation, left axis deviation, iRBBB, nonspecific T wave flattening  Labs (10/14): K 3.8, creatinine 0.7, BNP 96, HCT 39.7 Labs (1/15): K 3.7, creatinine 0.7, BNP 99, LDL 101, HDL 40 Labs (4/15): K 3.9, creatinine 0.6, BNP 94, HCT 39.5 Labs (8/15): HCT 38.2, BNP 167 Labs (9/15): K 4.2, creatinine 0.7 Labs (11/15): K 3.9, creatinine 0.7 Labs (3/16): K 4.4, creatinine 0.77, BNP 183  PMH: 1. Permanent atrial fibrillation: Holter monitor in the past showed bradycardia/pauses, so she is not on nodal blockers.  2. HTN 3. Chronic diastolic CHF: Echo (99991111) with EF 60-65%, mild LVH, moderately dilated RV with moderately decreased RV systolic function.  4. GERD 5. OA 6. Obesity 7. Lexiscan Cardiolite (10/14) with medium-sized inferior and inferoseptal fixed defect (old MI versus attenuation) and no ischemia.  8. Low back pain with sciatica  SH: Widow, nonsmoker, lives in [place], [place].   FH: No premature CAD  ROS: All systems reviewed and negative except as per HPI.   Current Outpatient Prescriptions  Medication Sig Dispense Refill  . acetaminophen (TYLENOL 8 HOUR) 650 MG CR tablet Take 1 tablet (650 mg total) by mouth every 8 (eight) hours as needed for pain. 30 tablet 3  . amLODipine-benazepril (LOTREL) 5-20 MG per capsule take 1 capsule by mouth once daily 90 capsule 3  .  calcium-vitamin D (OSCAL WITH D 500-200) 500-200 MG-UNIT per tablet Take 1 tablet by mouth daily.      . Cholecalciferol (VITAMIN D-3) 1000 UNITS CAPS Take by mouth daily.    Marland Kitchen ELIQUIS 5 MG TABS tablet take 1 tablet by mouth twice a day 60 tablet 3  . fish oil-omega-3 fatty acids 1000 MG capsule Take 2 g by mouth daily.    Marland Kitchen gabapentin (NEURONTIN) 100 MG capsule Take 1 capsule (100 mg total) by mouth at bedtime. 30 capsule 1  . Multiple Vitamin (MULTIVITAMIN) tablet Take 1 tablet by mouth daily.      . nitroGLYCERIN (NITROSTAT) 0.4 MG SL tablet Place 1 tablet (0.4 mg total) under the tongue every 5 (five) minutes as needed for chest pain. 25 tablet 3  . nystatin ointment (MYCOSTATIN) Apply 1 application topically 2 (two) times daily. 30 g 3  . ondansetron (ZOFRAN) 4 MG tablet Take 1 tablet (4 mg total) by mouth 2 (two) times daily. As needed for nausea 12 tablet 0  . spironolactone (ALDACTONE) 25 MG tablet take 1 tablet by mouth once daily 30 tablet 11  . furosemide (LASIX) 40 MG tablet 1 and 1/2 tablets (60mg ) two times a day 90 tablet 6   No current facility-administered medications for this visit.   BP 142/72 mmHg  Pulse 79  Ht 5\' 6"  (1.676 m)  Wt 225 lb (102.059 kg)  BMI 36.33 kg/m2 General: NAD Neck: JVP 8 cm, no thyromegaly or thyroid nodule.  Lungs:  Clear to auscultation bilaterally with normal respiratory effort. CV: Nondisplaced PMI.  Heart irregular S1/S2, no S3/S4, no murmur. Trace ankle edema.  No carotid bruit.  Normal pedal pulses.  Abdomen: Soft, nontender, no hepatosplenomegaly, no distention.  Skin: Intact without lesions or rashes.  Neurologic: Alert and oriented x 3.  Psych: Normal affect. Extremities: No clubbing or cyanosis.   Assessment/Plan: 1. Chronic diastolic CHF: Stable NYHA class II-III symptoms. She does look a bit volume overloaded on exam.  - Increase Lasix to 60 mg bid.  Check BNP today.  - BMET today.   2. Permanent atrial fibrillation: Rate is  controlled off nodal blockers (had bradycardia on nodal blockers).  She is on Eliquis now and seems to be tolerating it well.  Check CBC.  3. HTN: BP is mildly elevated.  I will not make changes to her BP regimen. 4. Check lipids.    Followup in 6 months.   Loralie Champagne 12/22/2014

## 2015-01-19 DIAGNOSIS — H40013 Open angle with borderline findings, low risk, bilateral: Secondary | ICD-10-CM | POA: Diagnosis not present

## 2015-02-06 ENCOUNTER — Other Ambulatory Visit: Payer: Self-pay | Admitting: Family Medicine

## 2015-02-13 ENCOUNTER — Other Ambulatory Visit: Payer: Self-pay | Admitting: Family Medicine

## 2015-02-17 ENCOUNTER — Other Ambulatory Visit: Payer: Self-pay | Admitting: Cardiology

## 2015-03-07 DIAGNOSIS — H26491 Other secondary cataract, right eye: Secondary | ICD-10-CM | POA: Diagnosis not present

## 2015-03-07 DIAGNOSIS — H26492 Other secondary cataract, left eye: Secondary | ICD-10-CM | POA: Diagnosis not present

## 2015-03-07 DIAGNOSIS — H40013 Open angle with borderline findings, low risk, bilateral: Secondary | ICD-10-CM | POA: Diagnosis not present

## 2015-03-07 DIAGNOSIS — Z961 Presence of intraocular lens: Secondary | ICD-10-CM | POA: Diagnosis not present

## 2015-04-17 ENCOUNTER — Other Ambulatory Visit: Payer: Self-pay | Admitting: *Deleted

## 2015-04-17 MED ORDER — GABAPENTIN 100 MG PO CAPS: 100.0000 mg | ORAL_CAPSULE | Freq: Every day | ORAL | Status: DC

## 2015-04-18 ENCOUNTER — Other Ambulatory Visit: Payer: Self-pay

## 2015-04-18 DIAGNOSIS — Z1231 Encounter for screening mammogram for malignant neoplasm of breast: Secondary | ICD-10-CM

## 2015-05-03 ENCOUNTER — Ambulatory Visit (INDEPENDENT_AMBULATORY_CARE_PROVIDER_SITE_OTHER): Payer: Medicare Other | Admitting: Family Medicine

## 2015-05-03 ENCOUNTER — Encounter: Payer: Self-pay | Admitting: Family Medicine

## 2015-05-03 VITALS — BP 137/87 | HR 91 | Temp 97.8°F | Ht 66.0 in | Wt 228.0 lb

## 2015-05-03 DIAGNOSIS — Z23 Encounter for immunization: Secondary | ICD-10-CM

## 2015-05-03 DIAGNOSIS — M179 Osteoarthritis of knee, unspecified: Secondary | ICD-10-CM | POA: Diagnosis not present

## 2015-05-03 DIAGNOSIS — Z131 Encounter for screening for diabetes mellitus: Secondary | ICD-10-CM | POA: Insufficient documentation

## 2015-05-03 DIAGNOSIS — Z0000 Encounter for general adult medical examination without abnormal findings: Secondary | ICD-10-CM | POA: Diagnosis not present

## 2015-05-03 DIAGNOSIS — R21 Rash and other nonspecific skin eruption: Secondary | ICD-10-CM

## 2015-05-03 DIAGNOSIS — M1710 Unilateral primary osteoarthritis, unspecified knee: Secondary | ICD-10-CM

## 2015-05-03 DIAGNOSIS — IMO0002 Reserved for concepts with insufficient information to code with codable children: Secondary | ICD-10-CM

## 2015-05-03 MED ORDER — GABAPENTIN 100 MG PO CAPS: 100.0000 mg | ORAL_CAPSULE | Freq: Three times a day (TID) | ORAL | Status: DC

## 2015-05-03 MED ORDER — CLOTRIMAZOLE 1 % EX CREA: 1.0000 "application " | TOPICAL_CREAM | Freq: Two times a day (BID) | CUTANEOUS | Status: DC

## 2015-05-03 NOTE — Patient Instructions (Signed)
Thank you for coming in,   I have changed your gabapentin to three times daily. This may cause some sleepiness so be careful taking it three times daily if you are feeling more tired.   I have sent in lotrimin for the rash.   You can try a low dose steroid for the spots on around your neck.   Please bring all of your medications with you to each visit.   Sign up for My Chart to have easy access to your labs results, and communication with your Primary care physician   Please feel free to call with any questions or concerns at any time, at [phone]. --Dr. Raeford Razor

## 2015-05-03 NOTE — Addendum Note (Signed)
Addended by: Leonia Corona R on: 05/03/2015 05:43 PM   Modules accepted: Orders

## 2015-05-03 NOTE — Addendum Note (Signed)
Addended by: Leonia Corona R on: 05/03/2015 04:23 PM   Modules accepted: Orders

## 2015-05-03 NOTE — Assessment & Plan Note (Addendum)
Having a rash under the panus  This is an ongoing issue and hasn't resolved with nystatin.  - try lotrimin cream  - advised to follow up if not resolving.

## 2015-05-03 NOTE — Progress Notes (Signed)
   Subjective:    Bianca [name] - 82 y.o. female MRN [id number]  Date of birth: Sep 30, 1930  CC physical   HPI  Bianca [name] is here for physical .  General Healthcare: Medication Compliance: yes  Aspirin: no Dx Hypertension: yes  Dx Hyperlipidemia: no  Diabetes: no  Dx Obesity: BMI 36 Weight Loss: no  Physical Activity: Reports walking 2-3 days per week.   Urinary Incontinence: yes, takes lasix   Social: Driving: yes. Reports cataracts surgery in 2015 Alcohol Use: no  Tobacco Use: no  Other Drugs: no  Independence: lives alone. Her son checks in on her and a lady twice per week  Support and Life at Home: retired Advanced Directives: completed    Cancer:  Colorectal >> Colonoscopy: reported her GI doctor told her she wouldn't need another one.   Lung >> Tobacco Use: no Breast >> Mammogram: last mammogram was normal   Cervical/Endometrial >>  - Hysterectomy: complete in the 1980's   - Vaginal Bleeding: no Skin >> she reports bumps on her chest that have been there for the past month. Denies any pain. Some itchiness.   Other: Osteoporosis: DEXA scan in 2010. Takes vitamin D   Zoster Vaccine: reports she was given this by her pharmacy 2016 Pneumonia Vaccine:   PMH: chronic afib on eliquis.    Health Maintenance:  Health Maintenance Due  Topic Date Due  . ZOSTAVAX  04/16/1992  . PNA vac Low Risk Adult (2 of 2 - PCV13) 11/15/1998  . COLONOSCOPY  09/18/2013    Review of Systems See HPI     Objective:   Physical Exam BP 137/87 mmHg  Pulse 91  Temp(Src) 97.8 F (36.6 C) (Oral)  Ht 5\' 6"  (1.676 m)  Wt 228 lb (103.42 kg)  BMI 36.82 kg/m2 Gen: NAD, alert, cooperative with exam,  CV: irregularly irregular, regular rate, good S1/S2, no murmur, no edema,  Resp: CTABL, no wheezes, non-labored Skin: redness under her panus,  Neuro: no gross deficits.  Psych: alert and oriented    Assessment & Plan:   Osteoarthrosis, unspecified whether generalized or  localized, involving lower leg Her pain and stiffness is still occurring.  - she would like to try an increase in her gabapentin so sent in 100 mg TID  - advised to follow up if this makes her too sleepy.   Annual physical exam Encouraged regular exercise  Reports no problems with her vision.  She has advanced directives placed.   Rash Having a rash under the panus  This is an ongoing issue and hasn't resolved with nystatin.  - try lotrimin cream  - advised to follow up if not resolving.

## 2015-05-03 NOTE — Assessment & Plan Note (Signed)
Her pain and stiffness is still occurring.  - she would like to try an increase in her gabapentin so sent in 100 mg TID  - advised to follow up if this makes her too sleepy.

## 2015-05-03 NOTE — Assessment & Plan Note (Signed)
Encouraged regular exercise  Reports no problems with her vision.  She has advanced directives placed.

## 2015-05-30 ENCOUNTER — Ambulatory Visit
Admission: RE | Admit: 2015-05-30 | Discharge: 2015-05-30 | Disposition: A | Discharge status: Home / Self Care | Payer: Medicare Other | Source: Ambulatory Visit

## 2015-05-30 DIAGNOSIS — Z1231 Encounter for screening mammogram for malignant neoplasm of breast: Secondary | ICD-10-CM | POA: Diagnosis not present

## 2015-06-21 ENCOUNTER — Encounter: Payer: Self-pay | Admitting: Family Medicine

## 2015-06-21 ENCOUNTER — Ambulatory Visit (INDEPENDENT_AMBULATORY_CARE_PROVIDER_SITE_OTHER): Payer: Medicare Other | Admitting: Family Medicine

## 2015-06-21 ENCOUNTER — Ambulatory Visit: Payer: Medicare Other | Admitting: Family Medicine

## 2015-06-21 VITALS — BP 171/98 | HR 83 | Temp 97.6°F | Wt 223.0 lb

## 2015-06-21 DIAGNOSIS — S93401A Sprain of unspecified ligament of right ankle, initial encounter: Secondary | ICD-10-CM

## 2015-06-21 DIAGNOSIS — S93409A Sprain of unspecified ligament of unspecified ankle, initial encounter: Secondary | ICD-10-CM | POA: Insufficient documentation

## 2015-06-21 NOTE — Progress Notes (Signed)
   Subjective:   Bianca [name] is a 82 y.o. female with a history of afib, OA, CHF here for ankle pain  EXTREMITY PAIN  Location: right ankle and foot on lateral side Pain started: yesterday evening Pain is: dull ache Severity: moderate Medications tried: gabapentin and tylenol Recent trauma: no Similar pain previously: no  Symptoms Redness:no Swelling:no Fever: no Weakness: no Weight loss: no Rash: no   Review of Symptoms - see HPI PMH - Smoking status noted.    Objective:  BP 171/98 mmHg  Pulse 83  Temp(Src) 97.6 F (36.4 C) (Oral)  Wt 223 lb (101.152 kg)  Gen:  82 y.o. female in NAD HEENT: NCAT, MMM, anicteric sclerae CV: RRR, no MRG Resp: Non-labored, CTAB, no wheezes noted Abd: Soft, NTND, BS present, no guarding or organomegaly MSK: R ankle with tenderness inferior and anterior to lateral maleolus, Full ROM, strength intact, no swelling or redness Neuro: Alert and oriented, speech normal    Assessment & Plan:     [name] is a 82 y.o. female here for ankle pain  Sprain of ankle Pain inferior to lateral maleolus, worse with ambulation, no known trauma - RICE, ankle brace given - no nsaids given on eliquis  - call if not improving in a week and will send to ortho/sports (sees [facility] for other issues)      Beverlyn Roux, MD, MPH [place] PGY-3 06/21/2015 12:00 PM

## 2015-06-21 NOTE — Patient Instructions (Signed)
Acute Ankle Sprain With Phase I Rehab An acute ankle sprain is a partial or complete tear in one or more of the ligaments of the ankle due to traumatic injury. The severity of the injury depends on both the number of ligaments sprained and the grade of sprain. There are 3 grades of sprains.   A grade 1 sprain is a mild sprain. There is a slight pull without obvious tearing. There is no loss of strength, and the muscle and ligament are the correct length.  A grade 2 sprain is a moderate sprain. There is tearing of fibers within the substance of the ligament where it connects two bones or two cartilages. The length of the ligament is increased, and there is usually decreased strength.  A grade 3 sprain is a complete rupture of the ligament and is uncommon. In addition to the grade of sprain, there are three types of ankle sprains.  Lateral ankle sprains: This is a sprain of one or more of the three ligaments on the outer side (lateral) of the ankle. These are the most common sprains. Medial ankle sprains: There is one large triangular ligament of the inner side (medial) of the ankle that is susceptible to injury. Medial ankle sprains are less common. Syndesmosis, "high ankle," sprains: The syndesmosis is the ligament that connects the two bones of the lower leg. Syndesmosis sprains usually only occur with very severe ankle sprains. SYMPTOMS  Pain, tenderness, and swelling in the ankle, starting at the side of injury that may progress to the whole ankle and foot with time.  "Pop" or tearing sensation at the time of injury.  Bruising that may spread to the heel.  Impaired ability to walk soon after injury. CAUSES   Acute ankle sprains are caused by trauma placed on the ankle that temporarily forces or pries the anklebone (talus) out of its normal socket.  Stretching or tearing of the ligaments that normally hold the joint in place (usually due to a twisting injury). RISK INCREASES  WITH:  Previous ankle sprain.  Sports in which the foot may land awkwardly (i.e., basketball, volleyball, or soccer) or walking or running on uneven or rough surfaces.  Shoes with inadequate support to prevent sideways motion when stress occurs.  Poor strength and flexibility.  Poor balance skills.  Contact sports. PREVENTION   Warm up and stretch properly before activity.  Maintain physical fitness:  Ankle and leg flexibility, muscle strength, and endurance.  Cardiovascular fitness.  Balance training activities.  Use proper technique and have a coach correct improper technique.  Taping, protective strapping, bracing, or high-top tennis shoes may help prevent injury. Initially, tape is best; however, it loses most of its support function within 10 to 15 minutes.  Wear proper-fitted protective shoes (High-top shoes with taping or bracing is more effective than either alone).  Provide the ankle with support during sports and practice activities for 12 months following injury. PROGNOSIS   If treated properly, ankle sprains can be expected to recover completely; however, the length of recovery depends on the degree of injury.  A grade 1 sprain usually heals enough in 5 to 7 days to allow modified activity and requires an average of 6 weeks to heal completely.  A grade 2 sprain requires 6 to 10 weeks to heal completely.  A grade 3 sprain requires 12 to 16 weeks to heal.  A syndesmosis sprain often takes more than 3 months to heal. RELATED COMPLICATIONS   Frequent recurrence of symptoms may  result in a chronic problem. Appropriately addressing the problem the first time decreases the frequency of recurrence and optimizes healing time. Severity of the initial sprain does not predict the likelihood of later instability.  Injury to other structures (bone, cartilage, or tendon).  A chronically unstable or arthritic ankle joint is a possibility with repeated  sprains. TREATMENT Treatment initially involves the use of ice, medication, and compression bandages to help reduce pain and inflammation. Ankle sprains are usually immobilized in a walking cast or boot to allow for healing. Crutches may be recommended to reduce pressure on the injury. After immobilization, strengthening and stretching exercises may be necessary to regain strength and a full range of motion. Surgery is rarely needed to treat ankle sprains. MEDICATION   Nonsteroidal anti-inflammatory medications, such as aspirin and ibuprofen (do not take for the first 3 days after injury or within 7 days before surgery), or other minor pain relievers, such as acetaminophen, are often recommended. Take these as directed by your caregiver. Contact your caregiver immediately if any bleeding, stomach upset, or signs of an allergic reaction occur from these medications.  Ointments applied to the skin may be helpful.  Pain relievers may be prescribed as necessary by your caregiver. Do not take prescription pain medication for longer than 4 to 7 days. Use only as directed and only as much as you need. HEAT AND COLD  Cold treatment (icing) is used to relieve pain and reduce inflammation for acute and chronic cases. Cold should be applied for 10 to 15 minutes every 2 to 3 hours for inflammation and pain and immediately after any activity that aggravates your symptoms. Use ice packs or an ice massage.  Heat treatment may be used before performing stretching and strengthening activities prescribed by your caregiver. Use a heat pack or a warm soak. SEEK IMMEDIATE MEDICAL CARE IF:   Pain, swelling, or bruising worsens despite treatment.  You experience pain, numbness, discoloration, or coldness in the foot or toes.  New, unexplained symptoms develop (drugs used in treatment may produce side effects.) EXERCISES  PHASE I EXERCISES RANGE OF MOTION (ROM) AND STRETCHING EXERCISES - Ankle Sprain, Acute Phase I,  Weeks 1 to 2 These exercises may help you when beginning to restore flexibility in your ankle. You will likely work on these exercises for the 1 to 2 weeks after your injury. Once your physician, physical therapist, or athletic trainer sees adequate progress, he or she will advance your exercises. While completing these exercises, remember:   Restoring tissue flexibility helps normal motion to return to the joints. This allows healthier, less painful movement and activity.  An effective stretch should be held for at least 30 seconds.  A stretch should never be painful. You should only feel a gentle lengthening or release in the stretched tissue. RANGE OF MOTION - Dorsi/Plantar Flexion  While sitting with your right / left knee straight, draw the top of your foot upwards by flexing your ankle. Then reverse the motion, pointing your toes downward.  After completing your first set of exercises, repeat this exercise with your knee bent.  RANGE OF MOTION - Ankle Alphabet  Imagine your right / left big toe is a pen.  Keeping your hip and knee still, write out the entire alphabet with your "pen." Make the letters as large as you can without increasing any discomfort.

## 2015-06-21 NOTE — Assessment & Plan Note (Signed)
Pain inferior to lateral maleolus, worse with ambulation, no known trauma - RICE, ankle brace given - no nsaids given on eliquis  - call if not improving in a week and will send to ortho/sports (sees [facility] for other issues)

## 2015-06-28 ENCOUNTER — Other Ambulatory Visit: Payer: Self-pay | Admitting: *Deleted

## 2015-06-28 ENCOUNTER — Ambulatory Visit (INDEPENDENT_AMBULATORY_CARE_PROVIDER_SITE_OTHER): Payer: Medicare Other | Admitting: Family Medicine

## 2015-06-28 ENCOUNTER — Encounter: Payer: Self-pay | Admitting: Family Medicine

## 2015-06-28 VITALS — BP 143/63 | HR 85 | Temp 98.3°F | Wt 224.8 lb

## 2015-06-28 DIAGNOSIS — M542 Cervicalgia: Secondary | ICD-10-CM | POA: Diagnosis not present

## 2015-06-28 MED ORDER — BACLOFEN 10 MG PO TABS: 5.0000 mg | ORAL_TABLET | Freq: Every evening | ORAL | Status: DC | PRN

## 2015-06-28 MED ORDER — KETOROLAC TROMETHAMINE 30 MG/ML IJ SOLN
15.0000 mg | Freq: Once | INTRAMUSCULAR | Status: AC
  Administered 2015-06-28: 15 mg via INTRAMUSCULAR

## 2015-06-28 NOTE — Patient Instructions (Addendum)
Your neck pain is due to a muscle strain.  - I recommend heating pad 10 to 15 minutes 2-3 times a day, followed by gentle stretching - A Toradol shot; you received today should help improve the pain sooner - You can try muscle relaxer; baclofen 5 mg (one half tablet) at night as needed.  Be careful with this medication as it may cause sleepiness, dizziness and make you more likely to fall.

## 2015-06-28 NOTE — Assessment & Plan Note (Signed)
Left-sided neck pain, worse with movement consistent with left upper trapezius strain, likely secondary to underlying arthritis.  - Recommended heating pad and gentle stretching - Will give Toradol shot IM given response to this in the past - Baclofen 5 mg daily at bedtime when necessary; advised caution as this may cause sleepiness and dizziness - Advised following up with PCP in 2 weeks if no improvement or sooner if develops headaches, fevers or upper extremity weakness or numbness

## 2015-06-28 NOTE — Progress Notes (Signed)
  Patient name: Bianca [name] MRN [id number]  Date of birth: 02-26-1930  CC & HPI:  Bianca [name] is a 82 y.o. female presenting today for Left-sided neck pain.  She reports left-sided neck achiness and pain beginning last Thursday.  Pain was present upon waking and has been persistent since without worsening.  Pain is worse with head movement, especially turning her head towards the left.  Denies any headache, fevers, upper extremity weakness or numbness.  Denies any remote history of head or neck trauma.  She has been using Tylenol with minimal improvement.  Reports improvement in her muscle skeletal pain in the past with muscle relaxers and Toradol shot.   Smoking History Noted ROS: See HPI  Objective Findings:  Vitals: BP 143/63 mmHg  Pulse 85  Temp(Src) 98.3 F (36.8 C) (Oral)  Wt 224 lb 12.8 oz (101.969 kg)  SpO2 99%  Gen: NAD CV: RRR w/o m/r/g, pulses +2 b/l Resp: CTAB w/ normal respiratory effort Neck: No erythema or swelling - Diffuse tenderness to palpation along left upper trapezius with muscle spasms noted - Range of motion full to flexion, extension, lateral flexion bilaterally, and rotation - Negative Spurling MSK: Bilateral upper extremity strength and sensation intact  Assessment & Plan:   Neck pain on left side Left-sided neck pain, worse with movement consistent with left upper trapezius strain, likely secondary to underlying arthritis.  - Recommended heating pad and gentle stretching - Will give Toradol shot IM given response to this in the past - Baclofen 5 mg daily at bedtime when necessary; advised caution as this may cause sleepiness and dizziness - Advised following up with PCP in 2 weeks if no improvement or sooner if develops headaches, fevers or upper extremity weakness or numbness

## 2015-06-29 MED ORDER — GABAPENTIN 100 MG PO CAPS: 100.0000 mg | ORAL_CAPSULE | Freq: Three times a day (TID) | ORAL | Status: DC

## 2015-07-11 ENCOUNTER — Other Ambulatory Visit: Payer: Self-pay | Admitting: *Deleted

## 2015-07-11 MED ORDER — CLOTRIMAZOLE 1 % EX CREA: 1.0000 "application " | TOPICAL_CREAM | Freq: Two times a day (BID) | CUTANEOUS | Status: DC

## 2015-07-23 ENCOUNTER — Other Ambulatory Visit: Payer: Self-pay | Admitting: Cardiology

## 2015-08-14 ENCOUNTER — Ambulatory Visit (INDEPENDENT_AMBULATORY_CARE_PROVIDER_SITE_OTHER): Payer: Medicare Other | Admitting: *Deleted

## 2015-08-14 ENCOUNTER — Encounter: Payer: Self-pay | Admitting: *Deleted

## 2015-08-14 VITALS — BP 143/83 | HR 72 | Ht 66.0 in | Wt 227.0 lb

## 2015-08-14 DIAGNOSIS — Z0000 Encounter for general adult medical examination without abnormal findings: Secondary | ICD-10-CM

## 2015-08-14 NOTE — Progress Notes (Signed)
Subjective:   Bianca [name] is a 82 y.o. female who presents for an Initial Medicare Annual Wellness Visit.   Review of Systems      Cardiac Risk Factors include: advanced age (>51men, >58 women);obesity (BMI >30kg/m2)     Objective:    Today's Vitals   08/14/15 1433  BP: (!) 143/83  Pulse: 72  Weight: 227 lb (103 kg)  Height: 5\' 6"  (1.676 m)   Body mass index is 36.64 kg/m.   Current Medications (verified) Outpatient Encounter Prescriptions as of 08/14/2015  Medication Sig  . acetaminophen (TYLENOL 8 HOUR) 650 MG CR tablet Take 1 tablet (650 mg total) by mouth every 8 (eight) hours as needed for pain.  Marland Kitchen amLODipine-benazepril (LOTREL) 5-20 MG capsule take 1 capsule by mouth once daily  . baclofen (LIORESAL) 10 MG tablet Take 0.5 tablets (5 mg total) by mouth at bedtime as needed for muscle spasms.  . calcium-vitamin D (OSCAL WITH D 500-200) 500-200 MG-UNIT per tablet Take 1 tablet by mouth daily.    . Cholecalciferol (VITAMIN D-3) 1000 UNITS CAPS Take by mouth daily.  . clotrimazole (LOTRIMIN) 1 % cream Apply 1 application topically 2 (two) times daily.  Marland Kitchen ELIQUIS 5 MG TABS tablet take 1 tablet by mouth twice a day  . fish oil-omega-3 fatty acids 1000 MG capsule Take 2 g by mouth daily.  . furosemide (LASIX) 40 MG tablet take 1 and 1/2 tablets by mouth twice a day  . gabapentin (NEURONTIN) 100 MG capsule Take 1 capsule (100 mg total) by mouth 3 (three) times daily.  . Multiple Vitamin (MULTIVITAMIN) tablet Take 1 tablet by mouth daily.    . nitroGLYCERIN (NITROSTAT) 0.4 MG SL tablet Place 1 tablet (0.4 mg total) under the tongue every 5 (five) minutes as needed for chest pain.  Marland Kitchen ondansetron (ZOFRAN) 4 MG tablet Take 1 tablet (4 mg total) by mouth 2 (two) times daily. As needed for nausea  . spironolactone (ALDACTONE) 25 MG tablet take 1 tablet by mouth once daily  . nystatin ointment (MYCOSTATIN) Apply 1 application topically 2 (two) times daily. (Patient not taking:  Reported on 08/14/2015)   No facility-administered encounter medications on file as of 08/14/2015.     Allergies (verified) Hydrocodone; Morphine; and Penicillins   History: Past Medical History:  Diagnosis Date  . Atrial fibrillation ([place])   . Chronic diastolic heart failure ([place])    Echo 02/2008: EF 55%, MAC, mild MR, moderate BAE  . GERD (gastroesophageal reflux disease)   . Hx of cardiovascular stress test    Lexiscan Myoview (10/14):  Fixed inf defect, no ischemia, study not gated  . Hx of echocardiogram    Echo (11/14):  Mild LVH, EF 60-65%, mod to severe LAE, mod RVE, mild to mod reduced RVSF, mild RAE, PASP 38  . Hypertension   . Long term current use of anticoagulant   . Obesity   . Osteoarthritis   . Refusal of blood transfusions as patient is Jehov[employer]'s Witness    Past Surgical History:  Procedure Laterality Date  . ABDOMINAL HYSTERECTOMY    . CARPAL TUNNEL RELEASE    . CHOLECYSTECTOMY    . IRRIGATION AND DEBRIDEMENT SEBACEOUS CYST  1980   sebaceous cysts removed from tail bone  . KNEE ARTHROSCOPY Bilateral   . TUBAL LIGATION     Family History  Problem Relation Age of Onset  . Asthma Mother   . Colon cancer Mother   . Depression Daughter   .  Diabetes      sibling  . Hypertension      sibling  . Diabetes Sister   . Diabetes Brother   . Kidney cancer Son   . Cancer Daughter   . Depression Daughter   . Clotting disorder Daughter   . Depression Daughter   . Heart attack Neg Hx    Social History   Occupational History  . Not on file.   Social History Main Topics  . Smoking status: Former Smoker    Types: Cigarettes    Quit date: 08/13/1968  . Smokeless tobacco: Never Used  . Alcohol use No  . Drug use: No  . Sexual activity: No    Tobacco Counseling Counseling given: No   Activities of Daily Living In your present state of health, do you have any difficulty performing the following activities: 08/14/2015  Hearing? N  Vision? N  Difficulty  concentrating or making decisions? N  Walking or climbing stairs? Y  Dressing or bathing? N  Doing errands, shopping? Y  Preparing Food and eating ? N  Using the Toilet? N  In the past six months, have you accidently leaked urine? Y  Do you have problems with loss of bowel control? N  Managing your Medications? N  Managing your Finances? N  Housekeeping or managing your Housekeeping? Y  Some recent data might be hidden    Immunizations and Health Maintenance Immunization History  Administered Date(s) Administered  . Influenza Whole 10/08/2006, 10/28/2007, 10/24/2008  . Influenza, High Dose Seasonal PF 10/15/2013, 09/26/2014  . Influenza,inj,Quad PF,36+ Mos 10/06/2012  . Pneumococcal Conjugate-13 05/03/2015  . Pneumococcal Polysaccharide-23 11/14/1997  . Td 01/14/1994, 01/14/2005, 10/22/2007   Health Maintenance Due  Topic Date Due  . ZOSTAVAX  04/16/1992  . COLONOSCOPY  09/18/2013    Patient Care Team: Carlyle Dolly, MD as PCP - General (Family Medicine) Monna Fam, MD (Ophthalmology) Cardiology -- [place] any recent Medical Services you may have received from other than [facility] providers in the past year (date may be approximate).     Assessment:   This is a routine wellness examination for [facility] She reports not hearing or vision problems. She had lasik and cataract surgery 3 years ago, and will be getting her eyes checked again in August.  Hearing/Vision screen  Hearing Screening   125Hz  250Hz  500Hz  1000Hz  2000Hz  3000Hz  4000Hz  6000Hz  8000Hz   Right ear:   40 40 40  40    Left ear:   40 40 40  40      Dietary issues and exercise activities discussed: Current Exercise Habits: Home exercise routine, Type of exercise: walking (walks around the block with her friend), Time (Minutes): 20, Frequency (Times/Week): 3, Weekly Exercise (Minutes/Week): 60, Intensity: Mild, Exercise limited by: orthopedic condition(s)  Goals    .  Reduce calorie intake to 2000 calories per day          Would like to restart weight watchers.     . Start water aerobis class          Plans to use her silver sneakers benefit to start water aerobics. Patient will call [employer] and [employer] to ask about benefits.     Bianca [name] has done weight watchers in the past and lost 14 pounds. She would like to get started again. She would like to start exercising more using her silver sneakers benefit. She is interested in trying water aerobics.   Depression Screen PHQ 2/9 Scores 08/14/2015  06/28/2015 06/21/2015 12/13/2014 09/13/2014 06/24/2014 05/04/2014  PHQ - 2 Score 3 0 0 0 0 0 0  PHQ- 9 Score 11 - - - - 7 -    Discussed the PHQ-9 = 11. Offered behavioral health consult -- patient declined. States she will call back if she decides she would like to meet with [facility].  Fall Risk Fall Risk  08/14/2015 06/28/2015 06/21/2015 12/13/2014 09/13/2014  Falls in the past year? No No No No No  Number falls in past yr: - - - - -  Risk Factor Category  - - - - -  Risk for fall due to : History of fall(s);Impaired mobility - - - -  Bianca [name] reports having fallen in the past, usually from her bed. She has been injured from past falls.  Tug Test: 12 seconds  Cognitive Function: Mini Cog passed: 3/5  Home Safety: Working smoke alarm? Yes Throw rungs are fastened down or removed? Yes Non-slip mats in the bathtub and shower? Not needed-- shower floor is textured to prevent falls All home stairs have railings or bannisters? N/A, no stairs in or around home Home's floors, stairs and hallways are free from clutter, wires and cords? Yes  Screening Tests Health Maintenance  Topic Date Due  . ZOSTAVAX  04/16/1992  . COLONOSCOPY  09/18/2013  . INFLUENZA VACCINE  08/15/2015  . TETANUS/TDAP  10/21/2017  . DEXA SCAN  Completed  . PNA vac Low Risk Adult  Completed  Bianca [name] reports having had the shingles vaccine at the [facility] on [address] and [address] a year or two ago. Bianca [name]  reports no longer needing colon cancer screening per her GI provider.    Plan:     Bianca [name] will research [employer] plans to find one that will work for her. We also discussed the nutrition services available at [facility]. Bianca [name] will call Bianca [name] if she wants to schedule an appointment.  She will also call the [facility] and [place] center to find out what type of aquatic classes are available using her Silver Sneakers benefit.   During the course of the visit, Bianca [name] was educated and counseled about the following appropriate screening and preventive services:   Vaccines to include flu (due this fall) & Tdap (due in 2019)  Cardiovascular disease screening  Colorectal cancer screening   Bone density screening   Nutrition counseling  Patient Instructions (the written plan) were given to the patient.    Haugh, Jerrad Mendibles   08/14/2015     I agree with the above note and with the content.   Smitty Cords, MD [place], PGY-2 09/14/2015  3:14 PM

## 2015-08-14 NOTE — Patient Instructions (Addendum)
It was a pleasure meeting you today! I've included the numbers for the [facility] and [facility] below so you can call them to learn about classes available through your silver sneakers benefit.  [facility] on [employer] at [phone] [facility] [phone]  I hope you are able to find a weight watchers plan that works for you. If you'd like to meet with our dietician, Iver Nestle, please give her a call to schedule and appointment. Her phone number is [phone].  You are up-to-date on your preventive screenings. But your flu shot will be due next month, and your tetanus shot is due in 2019.   I hope to see you next year for another annual wellness visit. Please return to see your PCP if you have any other concerns.  Have a great day!  Adonis Brook

## 2015-09-04 ENCOUNTER — Other Ambulatory Visit: Payer: Self-pay | Admitting: *Deleted

## 2015-09-04 MED ORDER — GABAPENTIN 100 MG PO CAPS: 100.0000 mg | ORAL_CAPSULE | Freq: Three times a day (TID) | ORAL | 1 refills | Status: DC

## 2015-09-05 DIAGNOSIS — H02833 Dermatochalasis of right eye, unspecified eyelid: Secondary | ICD-10-CM | POA: Diagnosis not present

## 2015-09-05 DIAGNOSIS — H40013 Open angle with borderline findings, low risk, bilateral: Secondary | ICD-10-CM | POA: Diagnosis not present

## 2015-09-05 DIAGNOSIS — H04123 Dry eye syndrome of bilateral lacrimal glands: Secondary | ICD-10-CM | POA: Diagnosis not present

## 2015-09-26 ENCOUNTER — Other Ambulatory Visit: Payer: Self-pay | Admitting: Cardiology

## 2015-10-31 ENCOUNTER — Other Ambulatory Visit: Payer: Self-pay | Admitting: *Deleted

## 2015-10-31 DIAGNOSIS — R21 Rash and other nonspecific skin eruption: Secondary | ICD-10-CM

## 2015-10-31 MED ORDER — CLOTRIMAZOLE 1 % EX CREA: 1.0000 "application " | TOPICAL_CREAM | Freq: Two times a day (BID) | CUTANEOUS | 0 refills | Status: DC

## 2015-10-31 MED ORDER — NYSTATIN 100000 UNIT/GM EX OINT: 1.0000 "application " | TOPICAL_OINTMENT | Freq: Two times a day (BID) | CUTANEOUS | 3 refills | Status: DC

## 2015-11-19 NOTE — Progress Notes (Signed)
Subjective:    Patient ID: Bianca [name] , female   DOB: [birth year] , 82 y.o..   MRN: [id number]  HPI  Bianca [name] is here for  Chief Complaint  Patient presents with  . Leg Pain    Leg Pain: Patient has bilateral knee pain. Has tingling in her legs as well. Has went to Dr. Noemi Chapel, had arthoscopy in 2001. At that time they wanted to do knee replacements. Patient thinks that her heart is too bad to get her knee replacements. She has been using Gabapentin but it is not helping. She has tried Tylenol in the past and it has helped a little. Tramadol has made her sick before. She does have capsaicin cream but it doesn't help.    Hypertension Blood pressure at home: Does not check Exercise: Limited by knee pain Low salt diet: no Medications: Compliant with medications Side effects: None ROS: Denies headache, dizziness, visual changes, nausea, vomiting, chest pain, abdominal pain or shortness of breath. BP Readings from Last 3 Encounters:  11/20/15 133/69  08/14/15 (!) 143/83  06/28/15 (!) 143/63   Last saw cardiology about 6 months ago. She is going to make an appointment with them soon. Denies any chest pain, shortness of breath, edema.    Review of Systems: Per HPI. All other systems reviewed and are negative.  Past Medical History: Patient Active Problem List   Diagnosis Date Noted  . Neck pain on left side 06/28/2015  . Sprain of ankle 06/21/2015  . Annual physical exam 05/03/2015  . Rash 05/03/2015  . Bilateral leg paresthesia 12/13/2014  . Furuncle 09/13/2014  . At high risk for falls 05/11/2012  . Long term (current) use of anticoagulants 08/15/2010  . ARTHRITIS, SHOULDERS, BILATERAL 09/08/2008  . Osteoarthrosis, unspecified whether generalized or localized, involving lower leg 10/28/2007  . Obesity 06/04/2007  . DEPRESSIVE DISORDER 06/04/2007  . BENIGN NEOPLASM OF COLON 04/06/2007  . Atrial fibrillation ([place]) 12/08/2006  . Chronic diastolic heart failure ([place])  12/08/2006  . HYPERTENSION, BENIGN ESSENTIAL 04/23/2006    Medications: reviewed and updated Current Outpatient Prescriptions  Medication Sig Dispense Refill  . acetaminophen (TYLENOL 8 HOUR) 650 MG CR tablet Take 1 tablet (650 mg total) by mouth every 8 (eight) hours as needed for pain. 30 tablet 3  . amLODipine-benazepril (LOTREL) 5-20 MG capsule take 1 capsule by mouth once daily 90 capsule 3  . baclofen (LIORESAL) 10 MG tablet Take 0.5 tablets (5 mg total) by mouth at bedtime as needed for muscle spasms. 10 each 0  . calcium-vitamin D (OSCAL WITH D 500-200) 500-200 MG-UNIT per tablet Take 1 tablet by mouth daily.      . Cholecalciferol (VITAMIN D-3) 1000 UNITS CAPS Take by mouth daily.    . clotrimazole (LOTRIMIN) 1 % cream Apply 1 application topically 2 (two) times daily. 30 g 0  . ELIQUIS 5 MG TABS tablet take 1 tablet by mouth twice a day 60 tablet 9  . fish oil-omega-3 fatty acids 1000 MG capsule Take 2 g by mouth daily.    . furosemide (LASIX) 40 MG tablet take 1 and 1/2 tablets by mouth twice a day 90 tablet 0  . gabapentin (NEURONTIN) 100 MG capsule Take 1 capsule (100 mg total) by mouth 4 (four) times daily. 120 capsule 1  . Multiple Vitamin (MULTIVITAMIN) tablet Take 1 tablet by mouth daily.      . nitroGLYCERIN (NITROSTAT) 0.4 MG SL tablet Place 1 tablet (0.4 mg total) under the tongue  every 5 (five) minutes as needed for chest pain. 25 tablet 3  . spironolactone (ALDACTONE) 25 MG tablet take 1 tablet by mouth once daily 30 tablet 2  . nystatin ointment (MYCOSTATIN) Apply 1 application topically 2 (two) times daily. (Patient not taking: Reported on 11/20/2015) 30 g 3  . ondansetron (ZOFRAN) 4 MG tablet Take 1 tablet (4 mg total) by mouth 2 (two) times daily. As needed for nausea (Patient not taking: Reported on 11/20/2015) 12 tablet 0   No current facility-administered medications for this visit.     Social Hx:  reports that she quit smoking about 47 years ago. Her smoking use  included Cigarettes. She has never used smokeless tobacco.   Objective:   BP 133/69   Pulse 82   Temp 98 F (36.7 C) (Oral)   Ht 5\' 6"  (1.676 m)   Wt 227 lb 6.4 oz (103.1 kg)   SpO2 98%   BMI 36.70 kg/m  Physical Exam  Gen: NAD, alert, cooperative with exam, well-appearing HEENT: NCAT, PERRL, clear conjunctiva, oropharynx clear, supple neck Cardiac: Regular rate and rhythm, trace edema in bilateral ankles, capillary refill brisk  Respiratory: Clear to auscultation bilaterally, no wheezes, non-labored breathing Gastrointestinal: soft, non tender, non distended, bowel sounds present Skin: no rashes, normal turgor  Neurological: Alert and oriented x 3, normal sensation in bilateral lower extremities MSK: Knees: No abnormalities to inspection, non tender to palpation, normal range of motion, 5/5 strength bilaterally, neurovascularly intact. Mild crepitus with knee extension bilaterally. Psych: good insight, normal mood and affect   Assessment & Plan:  HYPERTENSION, BENIGN ESSENTIAL At goal. Continue current therapy. Patient will make appointment with cardiology soon.   Bilateral leg paresthesia Unclear etiology. Possibly secondary to PAD vs referred knee pain. Physical exam showing only crepitus with knee extension and mild ankle edema. Will try increasing Gabapentin to 100 mg in the morning, 100 mg at lunch, and 200 mg at night. If patient continues to have pain, may need further work up/referral back to [facility] [facility].    Smitty Cords, MD [place], PGY-2

## 2015-11-20 ENCOUNTER — Encounter: Payer: Self-pay | Admitting: Family Medicine

## 2015-11-20 ENCOUNTER — Ambulatory Visit (INDEPENDENT_AMBULATORY_CARE_PROVIDER_SITE_OTHER): Payer: Medicare Other | Admitting: Family Medicine

## 2015-11-20 VITALS — BP 133/69 | HR 82 | Temp 98.0°F | Ht 66.0 in | Wt 227.4 lb

## 2015-11-20 DIAGNOSIS — R202 Paresthesia of skin: Secondary | ICD-10-CM | POA: Diagnosis not present

## 2015-11-20 DIAGNOSIS — I10 Essential (primary) hypertension: Secondary | ICD-10-CM

## 2015-11-20 LAB — CBC WITH DIFFERENTIAL/PLATELET
BASOS PCT: 0 %
Basophils Absolute: 0 cells/uL (ref 0–200)
EOS ABS: 66 {cells}/uL (ref 15–500)
Eosinophils Relative: 1 %
HEMATOCRIT: 39 % (ref 35.0–45.0)
HEMOGLOBIN: 12.2 g/dL (ref 11.7–15.5)
LYMPHS ABS: 3300 {cells}/uL (ref 850–3900)
Lymphocytes Relative: 50 %
MCH: 27.9 pg (ref 27.0–33.0)
MCHC: 31.3 g/dL — AB (ref 32.0–36.0)
MCV: 89.2 fL (ref 80.0–100.0)
MONO ABS: 462 {cells}/uL (ref 200–950)
MPV: 12.3 fL (ref 7.5–12.5)
Monocytes Relative: 7 %
NEUTROS PCT: 42 %
Neutro Abs: 2772 cells/uL (ref 1500–7800)
Platelets: 217 10*3/uL (ref 140–400)
RBC: 4.37 MIL/uL (ref 3.80–5.10)
RDW: 14.9 % (ref 11.0–15.0)
WBC: 6.6 10*3/uL (ref 3.8–10.8)

## 2015-11-20 LAB — COMPLETE METABOLIC PANEL WITH GFR
ALBUMIN: 4.1 g/dL (ref 3.6–5.1)
ALK PHOS: 62 U/L (ref 33–130)
ALT: 11 U/L (ref 6–29)
AST: 19 U/L (ref 10–35)
BUN: 19 mg/dL (ref 7–25)
CALCIUM: 10.2 mg/dL (ref 8.6–10.4)
CO2: 29 mmol/L (ref 20–31)
CREATININE: 0.83 mg/dL (ref 0.60–0.88)
Chloride: 103 mmol/L (ref 98–110)
GFR, EST AFRICAN AMERICAN: 75 mL/min (ref >=60)
GFR, Est Non African American: 65 mL/min (ref >=60)
Glucose, Bld: 171 mg/dL — ABNORMAL HIGH (ref 65–99)
Potassium: 5 mmol/L (ref 3.5–5.3)
Sodium: 141 mmol/L (ref 135–146)
Total Bilirubin: 0.6 mg/dL (ref 0.2–1.2)
Total Protein: 6.8 g/dL (ref 6.1–8.1)

## 2015-11-20 MED ORDER — GABAPENTIN 100 MG PO CAPS: 100.0000 mg | ORAL_CAPSULE | Freq: Four times a day (QID) | ORAL | 1 refills | Status: DC

## 2015-11-20 NOTE — Patient Instructions (Signed)
Thank you for coming in today, it was so nice to see you! Today we talked about:    Gabapentin: We can take 100 mg in the morning, 100 mg at lunch, and 200 mg at night. We will see if this helps your legs.   Please follow up as soon as you would like to receive knee injections. You can schedule this appointment at the front desk before you leave or call the clinic.  Bring in all your medications or supplements to each appointment for review.   If we ordered any tests today, you will be notified via telephone of any abnormalities. If everything is normal you will get a letter in the mail.   If you have any questions or concerns, please do not hesitate to call the office at [phone]. You can also message me directly via MyChart.   Sincerely,  Smitty Cords, MD

## 2015-11-22 ENCOUNTER — Telehealth: Payer: Self-pay | Admitting: Cardiology

## 2015-11-22 NOTE — Telephone Encounter (Signed)
I spoke with Bianca [name] at [facility] Per [facility]  needs clarification of medical information for date of service 12/21/14,  will fax information to [phone]. Bianca [name] states she has received the medical records for this date of service but needs provider review, she did not want to speak with the billing department.

## 2015-11-22 NOTE — Telephone Encounter (Signed)
New Message  Asa Lente voiced calling to get an update on documentation that was sent over and have not heard back from anyone as of yet.  Please f/u

## 2015-11-22 NOTE — Telephone Encounter (Signed)
New Message  Use this reference number for attached telephone note below. Ref#: [id number]

## 2015-11-26 NOTE — Assessment & Plan Note (Signed)
At goal. Continue current therapy. Patient will make appointment with cardiology soon.

## 2015-11-26 NOTE — Assessment & Plan Note (Signed)
Unclear etiology. Possibly secondary to PAD vs referred knee pain. Physical exam showing only crepitus with knee extension and mild ankle edema. Will try increasing Gabapentin to 100 mg in the morning, 100 mg at lunch, and 200 mg at night. If patient continues to have pain, may need further work up/referral back to [employer].

## 2015-11-27 ENCOUNTER — Encounter: Payer: Self-pay | Admitting: Family Medicine

## 2015-11-28 NOTE — Telephone Encounter (Signed)
Dr Aundra Dubin completed form from [facility] with information requested, returned to Medical Records to be faxed.

## 2015-12-01 ENCOUNTER — Telehealth: Payer: Self-pay | Admitting: Cardiology

## 2015-12-01 ENCOUNTER — Telehealth: Payer: Self-pay | Admitting: Family Medicine

## 2015-12-01 NOTE — Telephone Encounter (Signed)
Would like to talk to dr Juanito Doom about her blood results

## 2015-12-01 NOTE — Telephone Encounter (Signed)
Called patient back and discussed labs. She was appreciative of the call.

## 2015-12-01 NOTE — Telephone Encounter (Signed)
New Message  Pt voiced wanting an appt to see MD-[facility] and declined APP/Pa.  Please f/u with pt

## 2015-12-04 NOTE — Telephone Encounter (Signed)
Pt advised  appt has been scheduled with Dr Aundra Dubin 12/06/15.

## 2015-12-06 ENCOUNTER — Other Ambulatory Visit: Payer: Self-pay | Admitting: Internal Medicine

## 2015-12-06 ENCOUNTER — Ambulatory Visit (INDEPENDENT_AMBULATORY_CARE_PROVIDER_SITE_OTHER): Payer: Medicare Other | Admitting: Cardiology

## 2015-12-06 VITALS — BP 120/72 | HR 85 | Ht 66.0 in | Wt 225.0 lb

## 2015-12-06 DIAGNOSIS — I481 Persistent atrial fibrillation: Secondary | ICD-10-CM

## 2015-12-06 DIAGNOSIS — I5032 Chronic diastolic (congestive) heart failure: Secondary | ICD-10-CM | POA: Diagnosis not present

## 2015-12-06 DIAGNOSIS — R0602 Shortness of breath: Secondary | ICD-10-CM | POA: Diagnosis not present

## 2015-12-06 DIAGNOSIS — I10 Essential (primary) hypertension: Secondary | ICD-10-CM

## 2015-12-06 DIAGNOSIS — I4819 Other persistent atrial fibrillation: Secondary | ICD-10-CM

## 2015-12-06 MED ORDER — SPIRONOLACTONE 25 MG PO TABS: ORAL_TABLET | ORAL | 1 refills | Status: DC

## 2015-12-06 MED ORDER — CLOTRIMAZOLE 1 % EX CREA: 1.0000 "application " | TOPICAL_CREAM | Freq: Two times a day (BID) | CUTANEOUS | 0 refills | Status: DC

## 2015-12-06 MED ORDER — AMLODIPINE BESY-BENAZEPRIL HCL 5-20 MG PO CAPS: 1.0000 | ORAL_CAPSULE | Freq: Every day | ORAL | 3 refills | Status: DC

## 2015-12-06 MED ORDER — SPIRONOLACTONE 25 MG PO TABS: 25.0000 mg | ORAL_TABLET | Freq: Every day | ORAL | 2 refills | Status: DC

## 2015-12-06 MED ORDER — FUROSEMIDE 40 MG PO TABS: 60.0000 mg | ORAL_TABLET | Freq: Two times a day (BID) | ORAL | 0 refills | Status: DC

## 2015-12-06 NOTE — Patient Instructions (Signed)
Medication Instructions:  Decrease spironolactone to 12.5mg  daily-this will be 1/2 of 25mg  tablet daily  Labwork: BMET/BNP in about 2 weeks.  Testing/Procedures: Your physician has requested that you have an echocardiogram. Echocardiography is a painless test that uses sound waves to create images of your heart. It provides your doctor with information about the size and shape of your heart and how well your heart's chambers and valves are working. This procedure takes approximately one hour. There are no restrictions for this procedure.  Your physician has recommended that you have a pulmonary function test. Pulmonary Function Tests are a group of tests that measure how well air moves in and out of your lungs.    Follow-Up: Your physician wants you to follow-up in: 6 months with Truitt Merle, NP. (May 2018).  You will receive a reminder letter in the mail two months in advance. If you don't receive a letter, please call our office to schedule the follow-up appointment.       If you need a refill on your cardiac medications before your next appointment, please call your pharmacy.

## 2015-12-08 ENCOUNTER — Encounter: Payer: Self-pay | Admitting: Cardiology

## 2015-12-08 NOTE — Progress Notes (Signed)
Patient ID: Bianca [name], female   DOB: 12/09/1930, 82 y.o.   MRN: [id number] PCP: Dr. Juanito Doom  82 yo with history of permanent atrial fibrillation and diastolic CHF presents for cardiology followup.   She lives alone.  She continues to drive and does her own shopping, etc.  No chest pain.  No dyspnea walking short distances, dyspnea with longer distances (walking to mailbox).  She is short of breath making up bed or walking up steps.  This is unchanged.  No orthopnea, PND, falls, or syncope/lightheadedness. Weight is stable.  She has low back pain with sciatica and knee OA.  No melena or BRBPR.   ECG: Atrial fibrillation, nonspecific T wave flattening diffusely  Labs (10/14): K 3.8, creatinine 0.7, BNP 96, HCT 39.7 Labs (1/15): K 3.7, creatinine 0.7, BNP 99, LDL 101, HDL 40 Labs (4/15): K 3.9, creatinine 0.6, BNP 94, HCT 39.5 Labs (8/15): HCT 38.2, BNP 167 Labs (9/15): K 4.2, creatinine 0.7 Labs (11/15): K 3.9, creatinine 0.7 Labs (3/16): K 4.4, creatinine 0.77, BNP 183 Labs (12/16): LDL 80 Labs (11/17): K 5, creatinine 0.83, HCT 39  PMH: 1. Permanent atrial fibrillation: Holter monitor in the past showed bradycardia/pauses, so she is not on nodal blockers.  2. HTN 3. Chronic diastolic CHF: Echo (99991111) with EF 60-65%, mild LVH, moderately dilated RV with moderately decreased RV systolic function.  4. GERD 5. OA: Knees 6. Obesity 7. Lexiscan Cardiolite (10/14) with medium-sized inferior and inferoseptal fixed defect (old MI versus attenuation) and no ischemia.  8. Low back pain with sciatica  SH: Widow, nonsmoker, lives in [place], [place].   FH: No premature CAD  ROS: All systems reviewed and negative except as per HPI.   Current Outpatient Prescriptions  Medication Sig Dispense Refill  . acetaminophen (TYLENOL 8 HOUR) 650 MG CR tablet Take 1 tablet (650 mg total) by mouth every 8 (eight) hours as needed for pain. 30 tablet 3  . amLODipine-benazepril (LOTREL) 5-20 MG  capsule Take 1 capsule by mouth daily. 90 capsule 3  . baclofen (LIORESAL) 10 MG tablet Take 0.5 tablets (5 mg total) by mouth at bedtime as needed for muscle spasms. 10 each 0  . calcium-vitamin D (OSCAL WITH D 500-200) 500-200 MG-UNIT per tablet Take 1 tablet by mouth daily.      . Cholecalciferol (VITAMIN D-3) 1000 UNITS CAPS Take by mouth daily.    . clotrimazole (LOTRIMIN) 1 % cream Apply 1 application topically 2 (two) times daily. 30 g 0  . ELIQUIS 5 MG TABS tablet take 1 tablet by mouth twice a day 60 tablet 9  . fish oil-omega-3 fatty acids 1000 MG capsule Take 2 g by mouth daily.    . furosemide (LASIX) 40 MG tablet Take 1.5 tablets (60 mg total) by mouth 2 (two) times daily. 90 tablet 0  . gabapentin (NEURONTIN) 100 MG capsule Take 1 capsule (100 mg total) by mouth 4 (four) times daily. 120 capsule 1  . Multiple Vitamin (MULTIVITAMIN) tablet Take 1 tablet by mouth daily.      . nitroGLYCERIN (NITROSTAT) 0.4 MG SL tablet Place 1 tablet (0.4 mg total) under the tongue every 5 (five) minutes as needed for chest pain. 25 tablet 3  . nystatin ointment (MYCOSTATIN) Apply 1 application topically 2 (two) times daily. 30 g 3  . ondansetron (ZOFRAN) 4 MG tablet Take 1 tablet (4 mg total) by mouth 2 (two) times daily. As needed for nausea 12 tablet 0  . spironolactone (ALDACTONE) 25  MG tablet 1/2 tablet (12.5mg ) daily 45 tablet 1   No current facility-administered medications for this visit.    BP 120/72   Pulse 85   Ht 5\' 6"  (1.676 m)   Wt 225 lb (102.1 kg)   BMI 36.32 kg/m  General: NAD Neck: JVP 7 cm, no thyromegaly or thyroid nodule.  Lungs: Clear to auscultation bilaterally with normal respiratory effort. CV: Nondisplaced PMI.  Heart irregular S1/S2, no S3/S4, no murmur. Trace ankle edema.  No carotid bruit.  Normal pedal pulses.  Abdomen: Soft, nontender, no hepatosplenomegaly, no distention.  Skin: Intact without lesions or rashes.  Neurologic: Alert and oriented x 3.  Psych:  Normal affect. Extremities: No clubbing or cyanosis.   Assessment/Plan: 1. Chronic diastolic CHF: Stable NYHA class II-III symptoms. Volume status looks ok on exam today. - Continue Lasix 60 mg bid.    - I am going to get a repeat echo given ongoing significant dyspnea.  - I will get PFTs given ongoing significant dyspnea.    2. Permanent atrial fibrillation: Rate is controlled off nodal blockers (had bradycardia on nodal blockers).  She is on Eliquis now and seems to be tolerating it well.   3. HTN: BP controlled.  Given upper normal K, I will decrease spironolactone to 12.5 daily.  BMET/BNP in 2 wks.   Followup in 6 months with Truitt Merle then after that can see me in CHF clinic.   Loralie Champagne 12/08/2015

## 2015-12-12 ENCOUNTER — Encounter: Payer: Self-pay | Admitting: Internal Medicine

## 2015-12-12 ENCOUNTER — Ambulatory Visit (INDEPENDENT_AMBULATORY_CARE_PROVIDER_SITE_OTHER): Payer: Medicare Other | Admitting: Internal Medicine

## 2015-12-12 VITALS — BP 133/82 | HR 88 | Temp 97.6°F | Wt 227.0 lb

## 2015-12-12 DIAGNOSIS — M545 Low back pain, unspecified: Secondary | ICD-10-CM | POA: Insufficient documentation

## 2015-12-12 MED ORDER — TROLAMINE SALICYLATE 10 % EX LOTN
TOPICAL_LOTION | CUTANEOUS | 0 refills | Status: DC
Start: 2015-12-12 — End: 2016-06-05

## 2015-12-12 MED ORDER — BACLOFEN 10 MG PO TABS: 5.0000 mg | ORAL_TABLET | Freq: Two times a day (BID) | ORAL | 0 refills | Status: DC

## 2015-12-12 MED ORDER — KETOROLAC TROMETHAMINE 30 MG/ML IJ SOLN: 30.0000 mg | Freq: Once | INTRAMUSCULAR | Status: DC

## 2015-12-12 NOTE — Progress Notes (Signed)
   [name] Kerrin Mo, MD Phone: [phone]  Reason For Visit: SDA lower back pain  BACK PAIN  Back pain began 6 days ago  Pain is described as starting in the butt, moving to the lower back. Constant pain, 4/10, irritation .  Patient has tried gabapentin and tramadol. Has tried gabapentin/tramadol. Has taken tylenol which has relieved it some as well.  Pain does not radiate down the legs. Hx of Sciatic in the past.  History of trauma or injury: No  Prior history of similar pain: yes, received a shot in the spine a few years ago for a similar pain like this.  History of cancer: No hx of cancer. Up-to-date mammogram, CRC in the past  Weak immune system: No History of IV drug use: No History of steroid use: No  Symptoms Incontinence of bowel or bladder:  No Numbness of leg: No Fever: No Rest or Night pain: pain is worse at night when patient lays on her left side.  Weight Loss:  No Rash: No  Patient believes that patient has acute on chronic back pain every so often. No recent imagine, last noted in 2006.   Past Medical History Reviewed problem list.  Medications- reviewed and updated No additions to family history Social history- patient is a non-smoker  Objective: BP 133/82   Pulse 88   Temp 97.6 F (36.4 C) (Oral)   Wt 227 lb (103 kg)   BMI 36.64 kg/m  Gen: NAD, alert, cooperative with exam MSK: Normal gait and station Back: No abnormalities noted on inspection of back. No pain with palpation along spinal processes or paraspinal muscles. Slight pain with extension, no pain with flexion.Pain in lower lef back with left leg lift. 5 out of 5 strength in lower extremities. 2+ patellera reflexes bilaterally, no abnormalities in sensation Skin: dry, intact, no rashes or lesions Neuro: Strength and sensation grossly intact   Assessment/Plan: See problem based a/p  Lower back pain Acute on chronic lower back pain of 7 days duration. Hx  and physical consistent with lower back spasm. No red flags. - baclofen (LIORESAL) 10 MG tablet; Take 0.5 tablets (5 mg total) by mouth 2 (two) times daily.  Dispense: 10 each; Refill: 0 - Trolamine Salicylate (ASPERCREME) 10 % LOTN; Use on lower back every night  Dispense: 1 Bottle; Refill: 0 - Will provide lower back exercises to patient  - Heating pad as needed  - if no improvement in the next 4 weeks follow up with PCP, if worsening follow up sooner

## 2015-12-12 NOTE — Assessment & Plan Note (Signed)
Acute on chronic lower back pain of 7 days duration. Hx and physical consistent with lower back spasm. No red flags. - baclofen (LIORESAL) 10 MG tablet; Take 0.5 tablets (5 mg total) by mouth 2 (two) times daily.  Dispense: 10 each; Refill: 0 - Trolamine Salicylate (ASPERCREME) 10 % LOTN; Use on lower back every night  Dispense: 1 Bottle; Refill: 0 - Will provide lower back exercises to patient  - Heating pad as needed  - if no improvement in the next 4 weeks follow up with PCP, if worsening follow up sooner

## 2015-12-12 NOTE — Patient Instructions (Signed)
I believe you are having a lower back muscle spasm. I have provided you with baclofen,you can take this once in the morning and once at night as needed. I've also given you a cream that you can rub on the area of discomfort. I would recommend using a heating pad as needed for this pain. I would follow up in about 4 weeks with your PCP, if you have limited improvement. I would try to do that back exercises that I have provided below at least 2 times daily.    Back Exercises Introduction If you have pain in your back, do these exercises 2-3 times each day or as told by your doctor. When the pain goes away, do the exercises once each day, but repeat the steps more times for each exercise (do more repetitions). If you do not have pain in your back, do these exercises once each day or as told by your doctor. Exercises Single Knee to Chest  Do these steps 3-5 times in a row for each leg: 1. Lie on your back on a firm bed or the floor with your legs stretched out. 2. Bring one knee to your chest. 3. Hold your knee to your chest by grabbing your knee or thigh. 4. Pull on your knee until you feel a gentle stretch in your lower back. 5. Keep doing the stretch for 10-30 seconds. 6. Slowly let go of your leg and straighten it. Pelvic Tilt  Do these steps 5-10 times in a row: 1. Lie on your back on a firm bed or the floor with your legs stretched out. 2. Bend your knees so they point up to the ceiling. Your feet should be flat on the floor. 3. Tighten your lower belly (abdomen) muscles to press your lower back against the floor. This will make your tailbone point up to the ceiling instead of pointing down to your feet or the floor. 4. Stay in this position for 5-10 seconds while you gently tighten your muscles and breathe evenly. Cat-Cow  Do these steps until your lower back bends more easily: 1. Get on your hands and knees on a firm surface. Keep your hands under your shoulders, and keep your knees under  your hips. You may put padding under your knees. 2. Let your head hang down, and make your tailbone point down to the floor so your lower back is round like the back of a cat. 3. Stay in this position for 5 seconds. 4. Slowly lift your head and make your tailbone point up to the ceiling so your back hangs low (sags) like the back of a cow. 5. Stay in this position for 5 seconds. Press-Ups  Do these steps 5-10 times in a row: 1. Lie on your belly (face-down) on the floor. 2. Place your hands near your head, about shoulder-width apart. 3. While you keep your back relaxed and keep your hips on the floor, slowly straighten your arms to raise the top half of your body and lift your shoulders. Do not use your back muscles. To make yourself more comfortable, you may change where you place your hands. 4. Stay in this position for 5 seconds. 5. Slowly return to lying flat on the floor. Bridges  Do these steps 10 times in a row: 1. Lie on your back on a firm surface. 2. Bend your knees so they point up to the ceiling. Your feet should be flat on the floor. 3. Tighten your butt muscles and lift your  butt off of the floor until your waist is almost as high as your knees. If you do not feel the muscles working in your butt and the back of your thighs, slide your feet 1-2 inches farther away from your butt. 4. Stay in this position for 3-5 seconds. 5. Slowly lower your butt to the floor, and let your butt muscles relax. If this exercise is too easy, try doing it with your arms crossed over your chest. Belly Crunches  Do these steps 5-10 times in a row: 1. Lie on your back on a firm bed or the floor with your legs stretched out. 2. Bend your knees so they point up to the ceiling. Your feet should be flat on the floor. 3. Cross your arms over your chest. 4. Tip your chin a little bit toward your chest but do not bend your neck. 5. Tighten your belly muscles and slowly raise your chest just enough to lift  your shoulder blades a tiny bit off of the floor. 6. Slowly lower your chest and your head to the floor. Back Lifts  Do these steps 5-10 times in a row: 1. Lie on your belly (face-down) with your arms at your sides, and rest your forehead on the floor. 2. Tighten the muscles in your legs and your butt. 3. Slowly lift your chest off of the floor while you keep your hips on the floor. Keep the back of your head in line with the curve in your back. Look at the floor while you do this. 4. Stay in this position for 3-5 seconds. 5. Slowly lower your chest and your face to the floor. Contact a doctor if:  Your back pain gets a lot worse when you do an exercise.  Your back pain does not lessen 2 hours after you exercise. If you have any of these problems, stop doing the exercises. Do not do them again unless your doctor says it is okay. Get help right away if:  You have sudden, very bad back pain. If this happens, stop doing the exercises. Do not do them again unless your doctor says it is okay. This information is not intended to replace advice given to you by your health care provider. Make sure you discuss any questions you have with your health care provider. Document Released: 02/02/2010 Document Revised: 06/08/2015 Document Reviewed: 02/24/2014  2017 [employer]vier

## 2015-12-18 ENCOUNTER — Other Ambulatory Visit (INDEPENDENT_AMBULATORY_CARE_PROVIDER_SITE_OTHER): Payer: Medicare Other

## 2015-12-18 DIAGNOSIS — I10 Essential (primary) hypertension: Secondary | ICD-10-CM

## 2015-12-18 DIAGNOSIS — I481 Persistent atrial fibrillation: Secondary | ICD-10-CM | POA: Diagnosis not present

## 2015-12-18 DIAGNOSIS — I4819 Other persistent atrial fibrillation: Secondary | ICD-10-CM

## 2015-12-18 DIAGNOSIS — I5032 Chronic diastolic (congestive) heart failure: Secondary | ICD-10-CM | POA: Diagnosis not present

## 2015-12-18 DIAGNOSIS — R0602 Shortness of breath: Secondary | ICD-10-CM

## 2015-12-18 LAB — BASIC METABOLIC PANEL WITH GFR
BUN: 17 mg/dL (ref 7–25)
CO2: 31 mmol/L (ref 20–31)
Calcium: 9.5 mg/dL (ref 8.6–10.4)
Chloride: 104 mmol/L (ref 98–110)
Creat: 0.73 mg/dL (ref 0.60–0.88)
Glucose, Bld: 114 mg/dL — ABNORMAL HIGH (ref 65–99)
Potassium: 4.4 mmol/L (ref 3.5–5.3)
Sodium: 140 mmol/L (ref 135–146)

## 2015-12-18 LAB — BRAIN NATRIURETIC PEPTIDE: Brain Natriuretic Peptide: 151.2 pg/mL — ABNORMAL HIGH

## 2015-12-19 ENCOUNTER — Other Ambulatory Visit: Payer: Medicare Other

## 2015-12-20 ENCOUNTER — Telehealth: Payer: Self-pay | Admitting: Cardiology

## 2015-12-20 NOTE — Telephone Encounter (Signed)
Called and advised patient of the Dr's approval of her lab results.  She expressed understanding

## 2015-12-20 NOTE — Telephone Encounter (Signed)
Follow Up:    Pt returning call from yesterday,she says she does not know who called.

## 2015-12-21 ENCOUNTER — Other Ambulatory Visit: Payer: Self-pay | Admitting: Family Medicine

## 2015-12-21 ENCOUNTER — Other Ambulatory Visit: Payer: Self-pay | Admitting: Cardiology

## 2015-12-23 ENCOUNTER — Encounter (HOSPITAL_COMMUNITY): Payer: Self-pay

## 2015-12-23 ENCOUNTER — Emergency Department (HOSPITAL_COMMUNITY)
Admission: EM | Admit: 2015-12-23 | Discharge: 2015-12-23 | Disposition: A | Discharge status: Home / Self Care | Payer: Medicare Other | Attending: Emergency Medicine | Admitting: Emergency Medicine

## 2015-12-23 DIAGNOSIS — I110 Hypertensive heart disease with heart failure: Secondary | ICD-10-CM | POA: Diagnosis not present

## 2015-12-23 DIAGNOSIS — I5032 Chronic diastolic (congestive) heart failure: Secondary | ICD-10-CM | POA: Insufficient documentation

## 2015-12-23 DIAGNOSIS — M545 Low back pain, unspecified: Secondary | ICD-10-CM

## 2015-12-23 DIAGNOSIS — Z79899 Other long term (current) drug therapy: Secondary | ICD-10-CM | POA: Insufficient documentation

## 2015-12-23 DIAGNOSIS — Z87891 Personal history of nicotine dependence: Secondary | ICD-10-CM | POA: Diagnosis not present

## 2015-12-23 DIAGNOSIS — Z7901 Long term (current) use of anticoagulants: Secondary | ICD-10-CM | POA: Insufficient documentation

## 2015-12-23 LAB — URINALYSIS, ROUTINE W REFLEX MICROSCOPIC
Bilirubin Urine: NEGATIVE
Glucose, UA: NEGATIVE mg/dL
Hgb urine dipstick: NEGATIVE
Ketones, ur: NEGATIVE mg/dL
NITRITE: NEGATIVE
PROTEIN: NEGATIVE mg/dL
SPECIFIC GRAVITY, URINE: 1.01 (ref 1.005–1.030)
pH: 5 (ref 5.0–8.0)

## 2015-12-23 MED ORDER — BUPIVACAINE HCL (PF) 0.5 % IJ SOLN
10.0000 mL | Freq: Once | INTRAMUSCULAR | Status: AC
  Administered 2015-12-23: 10 mL
  Filled 2015-12-23: qty 10

## 2015-12-23 NOTE — ED Provider Notes (Signed)
[facility] DEPT Provider Note   CSN: [id number] Arrival date & time: 12/23/15  1133     History   Chief Complaint Chief Complaint  Patient presents with  . Back Pain    HPI Bianca [name] is a 82 y.o. female.  The history is provided by the patient.  Back Pain   This is a new problem. Episode onset: 3 weeks ago. The problem occurs constantly. The problem has not changed since onset.The pain is associated with no known injury. The pain is present in the lumbar spine and gluteal region. The quality of the pain is described as aching. The pain does not radiate. The pain is moderate. The symptoms are aggravated by twisting and certain positions. The pain is the same all the time. Pertinent negatives include no fever, no numbness, no abdominal pain, no bowel incontinence, no perianal numbness, no bladder incontinence and no weakness. She has tried muscle relaxants (heating pad) for the symptoms. The treatment provided no relief. Risk factors include obesity and lack of exercise (elderly).    Past Medical History:  Diagnosis Date  . Atrial fibrillation ([place])   . Chronic diastolic heart failure ([place])    Echo 02/2008: EF 55%, MAC, mild MR, moderate BAE  . GERD (gastroesophageal reflux disease)   . Hx of cardiovascular stress test    Lexiscan Myoview (10/14):  Fixed inf defect, no ischemia, study not gated  . Hx of echocardiogram    Echo (11/14):  Mild LVH, EF 60-65%, mod to severe LAE, mod RVE, mild to mod reduced RVSF, mild RAE, PASP 38  . Hypertension   . Long term current use of anticoagulant   . Obesity   . Osteoarthritis   . Refusal of blood transfusions as patient is Jehovah's Witness     Patient Active Problem List   Diagnosis Date Noted  . Lower back pain 12/12/2015  . Neck pain on left side 06/28/2015  . Sprain of ankle 06/21/2015  . Annual physical exam 05/03/2015  . Rash 05/03/2015  . Bilateral leg paresthesia 12/13/2014  . Furuncle 09/13/2014  . At high risk for  falls 05/11/2012  . Long term (current) use of anticoagulants 08/15/2010  . ARTHRITIS, SHOULDERS, BILATERAL 09/08/2008  . Osteoarthrosis, unspecified whether generalized or localized, involving lower leg 10/28/2007  . Obesity 06/04/2007  . DEPRESSIVE DISORDER 06/04/2007  . BENIGN NEOPLASM OF COLON 04/06/2007  . Atrial fibrillation ([place]) 12/08/2006  . Chronic diastolic heart failure ([place]) 12/08/2006  . HYPERTENSION, BENIGN ESSENTIAL 04/23/2006    Past Surgical History:  Procedure Laterality Date  . ABDOMINAL HYSTERECTOMY    . CARPAL TUNNEL RELEASE    . CHOLECYSTECTOMY    . IRRIGATION AND DEBRIDEMENT SEBACEOUS CYST  1980   sebaceous cysts removed from tail bone  . KNEE ARTHROSCOPY Bilateral   . TUBAL LIGATION      OB History    No data available       Home Medications    Prior to Admission medications   Medication Sig Start Date End Date Taking? Authorizing Provider  acetaminophen (TYLENOL 8 HOUR) 650 MG CR tablet Take 1 tablet (650 mg total) by mouth every 8 (eight) hours as needed for pain. 10/06/12   Rosemarie Ax, MD  amLODipine-benazepril (LOTREL) 5-20 MG capsule Take 1 capsule by mouth daily. 12/06/15   Verner Mould, MD  baclofen (LIORESAL) 10 MG tablet Take 0.5 tablets (5 mg total) by mouth 2 (two) times daily. 12/12/15   Asiyah Cletis Media, MD  calcium-vitamin D (OSCAL WITH D 500-200) 500-200 MG-UNIT per tablet Take 1 tablet by mouth daily.      Historical Provider, MD  Cholecalciferol (VITAMIN D-3) 1000 UNITS CAPS Take by mouth daily.    Historical Provider, MD  clotrimazole (LOTRIMIN) 1 % cream Apply 1 application topically 2 (two) times daily. 12/06/15   Verner Mould, MD  ELIQUIS 5 MG TABS tablet Take 1 tablet by mouth twice daily. 12/21/15   Larey Dresser, MD  fish oil-omega-3 fatty acids 1000 MG capsule Take 2 g by mouth daily.    Historical Provider, MD  furosemide (LASIX) 40 MG tablet Take 1.5 tablets (60 mg total) by mouth 2 (two)  times daily. 12/06/15   Verner Mould, MD  gabapentin (NEURONTIN) 100 MG capsule Take 1 capsule by mouth 4 times daily. 12/21/15   Carlyle Dolly, MD  Multiple Vitamin (MULTIVITAMIN) tablet Take 1 tablet by mouth daily.      Historical Provider, MD  nitroGLYCERIN (NITROSTAT) 0.4 MG SL tablet Place 1 tablet (0.4 mg total) under the tongue every 5 (five) minutes as needed for chest pain. 10/27/12   Liliane Shi, PA-C  nystatin ointment (MYCOSTATIN) Apply 1 application topically 2 (two) times daily. 10/31/15   Carlyle Dolly, MD  ondansetron (ZOFRAN) 4 MG tablet Take 1 tablet (4 mg total) by mouth 2 (two) times daily. As needed for nausea 02/27/13   Lutricia Feil, PA  spironolactone (ALDACTONE) 25 MG tablet 1/2 tablet (12.5mg ) daily 12/06/15   Larey Dresser, MD  Trolamine Salicylate (ASPERCREME) 10 % LOTN Use on lower back every night 12/12/15   Asiyah Cletis Media, MD    Family History Family History  Problem Relation Age of Onset  . Asthma Mother   . Colon cancer Mother   . Depression Daughter   . Diabetes      sibling  . Hypertension      sibling  . Diabetes Sister   . Diabetes Brother   . Kidney cancer Son   . Cancer Daughter   . Depression Daughter   . Clotting disorder Daughter   . Depression Daughter   . Heart attack Neg Hx     Social History Social History  Substance Use Topics  . Smoking status: Former Smoker    Types: Cigarettes    Quit date: 08/13/1968  . Smokeless tobacco: Never Used  . Alcohol use No     Allergies   Hydrocodone; Morphine; and Penicillins   Review of Systems Review of Systems  Constitutional: Negative for fever.  Gastrointestinal: Negative for abdominal pain and bowel incontinence.  Genitourinary: Negative for bladder incontinence.  Musculoskeletal: Positive for back pain.  Neurological: Negative for weakness and numbness.  All other systems reviewed and are negative.    Physical Exam Updated Vital  Signs BP 142/79 (BP Location: Right Arm)   Pulse 91   Temp 97.4 F (36.3 C) (Oral)   Resp 18   SpO2 96%   Physical Exam  Constitutional: She is oriented to person, place, and time. She appears well-developed and well-nourished. No distress.  HENT:  Head: Normocephalic.  Nose: Nose normal.  Eyes: Conjunctivae are normal.  Neck: Neck supple. No tracheal deviation present.  Cardiovascular: Normal rate and regular rhythm.   Pulmonary/Chest: Effort normal. No respiratory distress.  Abdominal: Soft. She exhibits no distension.  Musculoskeletal:       Lumbar back: She exhibits tenderness and pain. She exhibits normal range of motion and no bony  tenderness.       Back:  Neurological: She is alert and oriented to person, place, and time.  Skin: Skin is warm and dry.  Psychiatric: She has a normal mood and affect.     ED Treatments / Results  Labs (all labs ordered are listed, but only abnormal results are displayed) Labs Reviewed  URINALYSIS, ROUTINE W REFLEX MICROSCOPIC - Abnormal; Notable for the following:       Result Value   Leukocytes, UA MODERATE (*)    Bacteria, UA RARE (*)    Squamous Epithelial / LPF 0-5 (*)    All other components within normal limits    EKG  EKG Interpretation None       Radiology No results found.  Procedures Procedures (including critical care time)  Procedure Note: Trigger Point Injection for Myofascial pain  Performed by Dr. Laneta Simmers Indication: muscle/myofascial pain Muscle body and tendon sheath of the left lumbar paraspinal muscle(s) were injected with 0.5% bupivacaine under sterile technique for release of muscle spasm/pain. Patient tolerated well with immediate improvement of symptoms and no immediate complications following procedure.  CPT Code:   1 or 2 muscle bodies: 20552  Medications Ordered in ED Medications  bupivacaine (MARCAINE) 0.5 % injection 10 mL (10 mLs Infiltration Given 12/23/15 1317)     Initial Impression  / Assessment and Plan / ED Course  I have reviewed the triage vital signs and the nursing notes.  Pertinent labs & imaging results that were available during my care of the patient were reviewed by me and considered in my medical decision making (see chart for details).  Clinical Course     82 y.o. female presents with back pain in lumbar area since 3 weeks ago without signs of radicular pain. No acute traumatic onset. No red flag symptoms of fever, weight loss, saddle anesthesia, weakness, fecal/urinary incontinence or urinary retention. No urinary symptoms. Offered local trigger point injection for symptomatic treatment. Suspect MSK etiology. No indication for imaging emergently. Patient was recommended to take short course of scheduled NSAIDs and engage in early mobility as definitive treatment. Return precautions discussed for worsening or new concerning symptoms.    Final Clinical Impressions(s) / ED Diagnoses   Final diagnoses:  Acute left-sided low back pain without sciatica    New Prescriptions New Prescriptions   No medications on file     Leo Grosser, MD 12/23/15 1325

## 2015-12-23 NOTE — ED Triage Notes (Signed)
Patient here with 2-3 weeks of left lower back pain. Has seen her MD for same and taking baclofen with no relief. Pain with any change in position

## 2015-12-27 ENCOUNTER — Telehealth: Payer: Self-pay | Admitting: Family Medicine

## 2015-12-27 DIAGNOSIS — I5032 Chronic diastolic (congestive) heart failure: Secondary | ICD-10-CM

## 2015-12-27 DIAGNOSIS — I10 Essential (primary) hypertension: Secondary | ICD-10-CM

## 2015-12-27 DIAGNOSIS — R0602 Shortness of breath: Secondary | ICD-10-CM

## 2015-12-27 DIAGNOSIS — I4819 Other persistent atrial fibrillation: Secondary | ICD-10-CM

## 2015-12-27 NOTE — Telephone Encounter (Signed)
Refill request for Lasix and Lotrimin. Please send both to [facility]. Stated they can be e scribed to them.

## 2015-12-28 MED ORDER — AMLODIPINE BESY-BENAZEPRIL HCL 5-20 MG PO CAPS: 1.0000 | ORAL_CAPSULE | Freq: Every day | ORAL | 3 refills | Status: DC

## 2015-12-28 MED ORDER — FUROSEMIDE 40 MG PO TABS: 60.0000 mg | ORAL_TABLET | Freq: Two times a day (BID) | ORAL | 1 refills | Status: DC

## 2015-12-28 MED ORDER — CLOTRIMAZOLE 1 % EX CREA: 1.0000 "application " | TOPICAL_CREAM | Freq: Two times a day (BID) | CUTANEOUS | 0 refills | Status: DC

## 2015-12-28 MED ORDER — SPIRONOLACTONE 25 MG PO TABS: ORAL_TABLET | ORAL | 1 refills | Status: DC

## 2015-12-28 NOTE — Telephone Encounter (Signed)
Refilled medications to [facility].

## 2015-12-28 NOTE — Addendum Note (Signed)
Addended by: Carlyle Dolly on: 12/28/2015 08:43 AM   Modules accepted: Orders

## 2016-01-01 ENCOUNTER — Other Ambulatory Visit (HOSPITAL_COMMUNITY): Payer: Medicare Other

## 2016-01-05 ENCOUNTER — Ambulatory Visit (HOSPITAL_COMMUNITY): Payer: Medicare Other | Attending: Interventional Cardiology

## 2016-01-05 DIAGNOSIS — I10 Essential (primary) hypertension: Secondary | ICD-10-CM | POA: Diagnosis not present

## 2016-01-05 DIAGNOSIS — I4819 Other persistent atrial fibrillation: Secondary | ICD-10-CM

## 2016-01-05 DIAGNOSIS — I481 Persistent atrial fibrillation: Secondary | ICD-10-CM | POA: Insufficient documentation

## 2016-01-05 DIAGNOSIS — R0602 Shortness of breath: Secondary | ICD-10-CM | POA: Diagnosis not present

## 2016-01-05 DIAGNOSIS — I5032 Chronic diastolic (congestive) heart failure: Secondary | ICD-10-CM | POA: Diagnosis not present

## 2016-01-05 DIAGNOSIS — I340 Nonrheumatic mitral (valve) insufficiency: Secondary | ICD-10-CM | POA: Insufficient documentation

## 2016-01-09 ENCOUNTER — Other Ambulatory Visit: Payer: Self-pay | Admitting: *Deleted

## 2016-01-09 DIAGNOSIS — I77810 Thoracic aortic ectasia: Secondary | ICD-10-CM

## 2016-01-10 ENCOUNTER — Telehealth: Payer: Self-pay | Admitting: Cardiology

## 2016-01-10 NOTE — Telephone Encounter (Signed)
I was unable to find a DPR on file to speak with the patient's daughter, so I called and spoke to patient and the patient gives permission to speak with the daughter regarding her echo results.  I called and spoke with the patient's daughter Andris Flurry and I let her know that on the echo the patient's ascending aorta was dilated 4.7 cm and that she otherwise has normal LV systolic function and mildly decreased RV systolic function. I let her know that Dr. Aundra Dubin states that she needs a MRA of the chest to fully assess the ascending aorta and that an order had been placed for MRA to be done at [facility] at [address] and that they would call the patient to schedule that. I let her know that the patient may need to have lab work done prior to the test. The daughter verbalized understanding and was grateful for the call.

## 2016-01-10 NOTE — Telephone Encounter (Signed)
New message      A nurse called and gave pt echo results.  She did not understand what the nurse said.  Daughter is calling to get results to explain to pt

## 2016-01-12 ENCOUNTER — Other Ambulatory Visit: Payer: Self-pay | Admitting: Family Medicine

## 2016-01-12 DIAGNOSIS — M1990 Unspecified osteoarthritis, unspecified site: Secondary | ICD-10-CM

## 2016-01-12 MED ORDER — GABAPENTIN 100 MG PO CAPS: ORAL_CAPSULE | ORAL | 3 refills | Status: DC

## 2016-01-16 ENCOUNTER — Ambulatory Visit (INDEPENDENT_AMBULATORY_CARE_PROVIDER_SITE_OTHER): Payer: Medicare Other | Admitting: Internal Medicine

## 2016-01-16 ENCOUNTER — Telehealth: Payer: Self-pay | Admitting: Cardiology

## 2016-01-16 DIAGNOSIS — I10 Essential (primary) hypertension: Secondary | ICD-10-CM

## 2016-01-16 DIAGNOSIS — I5032 Chronic diastolic (congestive) heart failure: Secondary | ICD-10-CM

## 2016-01-16 DIAGNOSIS — I481 Persistent atrial fibrillation: Secondary | ICD-10-CM

## 2016-01-16 DIAGNOSIS — R0602 Shortness of breath: Secondary | ICD-10-CM

## 2016-01-16 DIAGNOSIS — I4819 Other persistent atrial fibrillation: Secondary | ICD-10-CM

## 2016-01-16 LAB — PULMONARY FUNCTION TEST
DL/VA % pred: 125 %
DL/VA: 6.16 ml/min/mmHg/L
DLCO UNC: 20.67 ml/min/mmHg
DLCO cor % pred: 83 %
DLCO cor: 21.28 ml/min/mmHg
DLCO unc % pred: 80 %
FEF 25-75 POST: 1.52 L/s
FEF 25-75 PRE: 0.97 L/s
FEF2575-%Change-Post: 55 %
FEF2575-%Pred-Post: 122 %
FEF2575-%Pred-Pre: 78 %
FEV1-%CHANGE-POST: 8 %
FEV1-%PRED-POST: 105 %
FEV1-%Pred-Pre: 97 %
FEV1-POST: 1.62 L
FEV1-PRE: 1.5 L
FEV1FVC-%Change-Post: 7 %
FEV1FVC-%PRED-PRE: 97 %
FEV6-%CHANGE-POST: 2 %
FEV6-%PRED-POST: 107 %
FEV6-%Pred-Pre: 104 %
FEV6-POST: 2.06 L
FEV6-Pre: 2 L
FEV6FVC-%CHANGE-POST: 2 %
FEV6FVC-%PRED-POST: 104 %
FEV6FVC-%Pred-Pre: 101 %
FVC-%CHANGE-POST: 0 %
FVC-%Pred-Post: 103 %
FVC-%Pred-Pre: 103 %
FVC-Post: 2.06 L
FVC-Pre: 2.05 L
PRE FEV6/FVC RATIO: 98 %
Post FEV1/FVC ratio: 79 %
Post FEV6/FVC ratio: 100 %
Pre FEV1/FVC ratio: 73 %
RV % PRED: 187 %
RV: 4.7 L
TLC % PRED: 129 %
TLC: 6.71 L

## 2016-01-16 NOTE — Telephone Encounter (Signed)
Pt calling to ask Dr Aundra Dubin and RN if she needs to have a BMET done prior too her scheduled MRA of the chest on 01/23/16.  Pt states she recently had a bmet done on 12/18/15, and Dr Aundra Dubin noted her labs to be normal.  Pt states that she was informed that she may have to have this repeated, prior to her MRA on 1/9.  Informed the pt that both Dr Aundra Dubin and RN are out of the office but I will route this message to them for further review and recommendation, and follow-up with the pt thereafter.  Pt verbalized understanding and agrees with this plan.

## 2016-01-16 NOTE — Telephone Encounter (Signed)
Bianca [name] is calling to find out if she is needing to have a blood test before she has the test done on 01/23/16. Please call

## 2016-01-16 NOTE — Telephone Encounter (Signed)
Don't think she will need it repeated but will depend on radiology's protocol.

## 2016-01-17 NOTE — Telephone Encounter (Signed)
Patient informed and verbalized understanding of plan. 

## 2016-01-23 ENCOUNTER — Ambulatory Visit
Admission: RE | Admit: 2016-01-23 | Discharge: 2016-01-23 | Disposition: A | Discharge status: Home / Self Care | Payer: Medicare Other | Source: Ambulatory Visit | Attending: Cardiology | Admitting: Cardiology

## 2016-01-23 DIAGNOSIS — I77810 Thoracic aortic ectasia: Secondary | ICD-10-CM

## 2016-01-25 ENCOUNTER — Ambulatory Visit
Admission: RE | Admit: 2016-01-25 | Discharge: 2016-01-25 | Disposition: A | Discharge status: Home / Self Care | Payer: Medicare Other | Source: Ambulatory Visit | Attending: Cardiology | Admitting: Cardiology

## 2016-01-25 DIAGNOSIS — I712 Thoracic aortic aneurysm, without rupture: Secondary | ICD-10-CM | POA: Diagnosis not present

## 2016-01-25 MED ORDER — GADOBENATE DIMEGLUMINE 529 MG/ML IV SOLN
20.0000 mL | Freq: Once | INTRAVENOUS | Status: AC | PRN
  Administered 2016-01-25: 20 mL via INTRAVENOUS

## 2016-02-02 ENCOUNTER — Telehealth: Payer: Self-pay | Admitting: *Deleted

## 2016-02-02 DIAGNOSIS — I77810 Thoracic aortic ectasia: Secondary | ICD-10-CM

## 2016-02-02 NOTE — Telephone Encounter (Signed)
Notes Recorded by Larey Dresser, MD on 01/28/2016 at 2:20 PM EST Dilated ascending aorta to 4.7 cm. Repeat MRA chest in 6 months.  02/02/16--discussed with patient, pt verbalized understanding. Pt would like to go ahead and schedule MRA chest for 6 months, I will forward to [facility] to contact pt to schedule.

## 2016-02-16 ENCOUNTER — Other Ambulatory Visit: Payer: Self-pay | Admitting: Family Medicine

## 2016-02-16 ENCOUNTER — Other Ambulatory Visit: Payer: Self-pay | Admitting: *Deleted

## 2016-02-18 MED ORDER — GABAPENTIN 100 MG PO CAPS: ORAL_CAPSULE | ORAL | 3 refills | Status: DC

## 2016-02-27 ENCOUNTER — Other Ambulatory Visit: Payer: Self-pay | Admitting: *Deleted

## 2016-02-27 MED ORDER — GABAPENTIN 100 MG PO CAPS: ORAL_CAPSULE | ORAL | 3 refills | Status: DC

## 2016-02-27 NOTE — Telephone Encounter (Signed)
[facility] called requesting a refill on Gabapentin 100 mg.  Patient requested a change in dosage from 4 tabs daily to 1 tab in AM and 2 tabs PM.  Please give them a call at [phone] or send in new Rx.  Bianca Barrow, RN

## 2016-03-08 DIAGNOSIS — H40013 Open angle with borderline findings, low risk, bilateral: Secondary | ICD-10-CM | POA: Diagnosis not present

## 2016-03-08 DIAGNOSIS — H35373 Puckering of macula, bilateral: Secondary | ICD-10-CM | POA: Diagnosis not present

## 2016-03-08 DIAGNOSIS — H26493 Other secondary cataract, bilateral: Secondary | ICD-10-CM | POA: Diagnosis not present

## 2016-03-15 ENCOUNTER — Other Ambulatory Visit: Payer: Self-pay | Admitting: Family Medicine

## 2016-03-27 ENCOUNTER — Other Ambulatory Visit: Payer: Self-pay | Admitting: *Deleted

## 2016-03-27 MED ORDER — CLOTRIMAZOLE 1 % EX CREA: 1.0000 "application " | TOPICAL_CREAM | Freq: Two times a day (BID) | CUTANEOUS | 0 refills | Status: DC

## 2016-04-02 DIAGNOSIS — H26492 Other secondary cataract, left eye: Secondary | ICD-10-CM | POA: Diagnosis not present

## 2016-04-16 ENCOUNTER — Other Ambulatory Visit: Payer: Self-pay | Admitting: Family Medicine

## 2016-04-16 DIAGNOSIS — Z1231 Encounter for screening mammogram for malignant neoplasm of breast: Secondary | ICD-10-CM

## 2016-05-17 ENCOUNTER — Other Ambulatory Visit: Payer: Self-pay | Admitting: Family Medicine

## 2016-05-20 ENCOUNTER — Encounter: Payer: Self-pay | Admitting: *Deleted

## 2016-05-30 ENCOUNTER — Telehealth: Payer: Self-pay | Admitting: Family Medicine

## 2016-05-30 NOTE — Telephone Encounter (Signed)
Pre-visit call. Patient confirmed appointment details. No further concerns at this time.

## 2016-05-31 ENCOUNTER — Ambulatory Visit (INDEPENDENT_AMBULATORY_CARE_PROVIDER_SITE_OTHER): Payer: Medicare Other | Admitting: Family Medicine

## 2016-05-31 ENCOUNTER — Encounter: Payer: Self-pay | Admitting: Family Medicine

## 2016-05-31 VITALS — BP 138/88 | HR 85 | Temp 98.1°F | Ht 66.0 in | Wt 228.6 lb

## 2016-05-31 DIAGNOSIS — R600 Localized edema: Secondary | ICD-10-CM

## 2016-05-31 DIAGNOSIS — I10 Essential (primary) hypertension: Secondary | ICD-10-CM

## 2016-05-31 DIAGNOSIS — G8929 Other chronic pain: Secondary | ICD-10-CM | POA: Diagnosis not present

## 2016-05-31 DIAGNOSIS — R739 Hyperglycemia, unspecified: Secondary | ICD-10-CM | POA: Diagnosis not present

## 2016-05-31 DIAGNOSIS — M25561 Pain in right knee: Secondary | ICD-10-CM

## 2016-05-31 DIAGNOSIS — M25562 Pain in left knee: Secondary | ICD-10-CM | POA: Diagnosis not present

## 2016-05-31 DIAGNOSIS — Z0000 Encounter for general adult medical examination without abnormal findings: Secondary | ICD-10-CM | POA: Diagnosis not present

## 2016-05-31 LAB — POCT GLYCOSYLATED HEMOGLOBIN (HGB A1C): HEMOGLOBIN A1C: 5.6

## 2016-05-31 MED ORDER — DICLOFENAC SODIUM 1 % TD GEL: 2.0000 g | Freq: Four times a day (QID) | TRANSDERMAL | 3 refills | Status: DC

## 2016-05-31 NOTE — Assessment & Plan Note (Signed)
Patient doing well overall. Health maintenance up to date. Follow up in 6 months. Will check CBC, BMP, and hgb A1c today at patient's request

## 2016-05-31 NOTE — Progress Notes (Signed)
Subjective:  Bianca [name] is a 83 y.o. year old female who presents to office today for an annual physical examination.  Concerns today include:  1. Joint pains: Patient notes that she has joint pains all over, most notably in her knees. She states she has "bone on bone" arthritis in her knees. This has been going on for "years".  No falls, weakness, numbness. Denies any joint swelling. Ambulates with her cane or walker sometimes due to the pain. Is taking Tylenol occasionally which is not providing much relief  2. Lower Extremity Edema: Patient notes that for the last month she has had some swelling in her feet and ankles. Denies any chest pain, shortness of breath, wheezing, orthopnea. Notes that she is seeing her cardiologist next week. Has not been using her compression stockings because they are hard to put on by herself.   Review of Systems: Per HPI. All other systems reviewed and are negative.  General Healthcare: Medication Compliance: Yes Dx Hypertension: Yes Dx Hyperlipidemia: Yes Diabetes: No Dx Obesity: Yes Weight Loss: No Physical Activity: Limited secondary to knee pain but does ambulate independently. Sometimes uses walker or cane Urinary Incontinence: No  Menstrual hx: Post menopausal  Social:  reports that she quit smoking about 47 years ago. Her smoking use included Cigarettes. She has never used smokeless tobacco. Driving: Drives herself, wears seatbelt  Alcohol Use: socially Tobacco No  Other Drugs: No  Support and Life at Home: Good support Advanced Directives: Yes Work: None  Cancer:  Colorectal >> Colonoscopy: N/A. Risks outweigh benefits for her age and chronic anticoagulation use Lung >> Tobacco Use: None Breast >> Mammogram: Up to date Cervical/Endometrial >>  - Postmenopausal: Yes - Hysterectomy: Yes - Vaginal Bleeding: No  Skin >> Suspicious lesions: No  Other: Osteoporosis: No  TDAP: Up to date Zoster Vaccine: Up to date Pneumonia  Vaccine: Up to date  Past Medical History Past Medical History:  Diagnosis Date  . Atrial fibrillation ([place])   . Chronic diastolic heart failure ([place])    Echo 02/2008: EF 55%, MAC, mild MR, moderate BAE  . GERD (gastroesophageal reflux disease)   . Hx of cardiovascular stress test    Lexiscan Myoview (10/14):  Fixed inf defect, no ischemia, study not gated  . Hx of echocardiogram    Echo (11/14):  Mild LVH, EF 60-65%, mod to severe LAE, mod RVE, mild to mod reduced RVSF, mild RAE, PASP 38  . Hypertension   . Long term current use of anticoagulant   . Obesity   . Osteoarthritis   . Refusal of blood transfusions as patient is Jehovah's Witness    Patient Active Problem List   Diagnosis Date Noted  . Thoracic aortic aneurysm ([place]) 06/05/2016  . Hypertensive heart disease 06/05/2016  . Lower back pain 12/12/2015  . Neck pain on left side 06/28/2015  . Sprain of ankle 06/21/2015  . Annual physical exam 05/03/2015  . Rash 05/03/2015  . Bilateral leg paresthesia 12/13/2014  . Furuncle 09/13/2014  . At high risk for falls 05/11/2012  . Knee pain, bilateral 04/30/2012  . Long term (current) use of anticoagulants 08/15/2010  . Bilateral leg edema 06/06/2010  . ARTHRITIS, SHOULDERS, BILATERAL 09/08/2008  . Osteoarthrosis, unspecified whether generalized or localized, involving lower leg 10/28/2007  . Obesity 06/04/2007  . DEPRESSIVE DISORDER 06/04/2007  . BENIGN NEOPLASM OF COLON 04/06/2007  . Atrial fibrillation ([place]) 12/08/2006  . Chronic diastolic heart failure ([place]) 12/08/2006  . HYPERTENSION, BENIGN ESSENTIAL 04/23/2006  Medications- reviewed and updated Current Outpatient Prescriptions  Medication Sig Dispense Refill  . amLODipine-benazepril (LOTREL) 5-20 MG capsule Take 1 capsule by mouth daily. 90 capsule 3  . Cholecalciferol (VITAMIN D-3) 1000 UNITS CAPS Take 1,000 Units by mouth daily.     . clotrimazole (LOTRIMIN) 1 % cream Apply 1 application topically 2 (two) times  daily. 30 g 0  . ELIQUIS 5 MG TABS tablet Take 1 tablet by mouth twice daily. 60 tablet 9  . fish oil-omega-3 fatty acids 1000 MG capsule Take 2 g by mouth daily.    . furosemide (LASIX) 40 MG tablet Take 1.5 tablets (60 mg total) by mouth 2 (two) times daily. 60 tablet 2  . gabapentin (NEURONTIN) 100 MG capsule Take 1 capsule in the morning at 2 capsules at night 120 capsule 3  . Multiple Vitamin (MULTIVITAMIN) tablet Take 1 tablet by mouth daily.      Marland Kitchen spironolactone (ALDACTONE) 25 MG tablet 1/2 tablet (12.62m) daily 45 tablet 1  . acetaminophen (TYLENOL 8 HOUR) 650 MG CR tablet Take 1 tablet (650 mg total) by mouth every 8 (eight) hours as needed for pain. (Patient not taking: Reported on 05/31/2016) 30 tablet 3  . calcium-vitamin D (OSCAL WITH D 500-200) 500-200 MG-UNIT per tablet Take 1 tablet by mouth daily.      . diclofenac sodium (VOLTAREN) 1 % GEL Apply 2 g topically 4 (four) times daily. 1 Tube 3  . nitroGLYCERIN (NITROSTAT) 0.4 MG SL tablet Place 1 tablet (0.4 mg total) under the tongue every 5 (five) minutes as needed for chest pain. (Patient not taking: Reported on 05/31/2016) 25 tablet 3   No current facility-administered medications for this visit.     Objective: BP 138/88   Pulse 85   Temp 98.1 F (36.7 C) (Oral)   Ht 5' 6"  (1.676 m)   Wt 228 lb 9.6 oz (103.7 kg)   SpO2 99%   BMI 36.90 kg/m  Gen: In no acute distress, alert, cooperative with exam, well groomed, pleasant HEENT: NCAT, EOMI, PERRL CV: irregular rhythm with rate control, 1+ pitting edema in ankles and feet, 2+ DP pulses bilaterally Resp: Clear to auscultation bilaterally, no wheezes, non-labored Abd: Soft, Non Tender, Non Distended, bowel sounds present, no guarding or organomegaly Ext: warm and well perfused Neuro: Alert and oriented, No gross deficits, normal gait Psych: Normal mood and affect  Assessment/Plan:  Annual physical exam Patient doing well overall. Health maintenance up to date. Follow  up in 6 months. Will check CBC, BMP, and hgb A1c today at patient's request  HYPERTENSION, BENIGN ESSENTIAL Controlled and at goal. Continue current medication regimen.   Knee pain, bilateral Suspect this is from OA. Will prescribe voltaren gel. Follow up as needed  Bilateral leg edema Bilateral ankle edema. Could be from amlodipine vs dependent edema. Less likely fluid overload from heart failure. Encouraged patient to use compression stockings, she has some at home. Will follow up with cardiologist next week.    Orders Placed This Encounter  Procedures  . CBC with Differential  . Basic Metabolic Panel  . POCT glycosylated hemoglobin (Hb A1C)    Meds ordered this encounter  Medications  . diclofenac sodium (VOLTAREN) 1 % GEL    Sig: Apply 2 g topically 4 (four) times daily.    Dispense:  1 Tube    Refill:  3     CSmitty Cords MD [place] PGY-2

## 2016-05-31 NOTE — Assessment & Plan Note (Addendum)
Controlled and at goal. Continue current medication regimen.

## 2016-05-31 NOTE — Patient Instructions (Signed)
Thank you for coming in today, it was so nice to see you! Today we talked about:    Complete physical exam: You are doing great! Please follow up with the heart doctor, I will look out for their note so we can coordinate your care.   Please follow up in 6 months or sooner if needed.   If we ordered any tests today, you will be notified via telephone of any abnormalities. If everything is normal you will get a letter in the mail.   If you have any questions or concerns, please do not hesitate to call the office at [phone]. You can also message me directly via MyChart.   Sincerely,  Smitty Cords, MD

## 2016-06-01 LAB — BASIC METABOLIC PANEL
BUN / CREAT RATIO: 17 (ref 12–28)
BUN: 12 mg/dL (ref 8–27)
CHLORIDE: 99 mmol/L (ref 96–106)
CO2: 28 mmol/L (ref 18–29)
CREATININE: 0.7 mg/dL (ref 0.57–1.00)
Calcium: 9.9 mg/dL (ref 8.7–10.3)
GFR calc Af Amer: 92 mL/min/{1.73_m2} (ref >59)
GFR calc non Af Amer: 80 mL/min/{1.73_m2} (ref >59)
GLUCOSE: 107 mg/dL — AB (ref 65–99)
POTASSIUM: 4.5 mmol/L (ref 3.5–5.2)
SODIUM: 140 mmol/L (ref 134–144)

## 2016-06-01 LAB — CBC WITH DIFFERENTIAL/PLATELET
BASOS ABS: 0 10*3/uL (ref 0.0–0.2)
Basos: 1 %
EOS (ABSOLUTE): 0.1 10*3/uL (ref 0.0–0.4)
Eos: 1 %
HEMOGLOBIN: 12.4 g/dL (ref 11.1–15.9)
Hematocrit: 39.5 % (ref 34.0–46.6)
Immature Grans (Abs): 0 10*3/uL (ref 0.0–0.1)
Immature Granulocytes: 0 %
LYMPHS ABS: 2.8 10*3/uL (ref 0.7–3.1)
Lymphs: 45 %
MCH: 27.7 pg (ref 26.6–33.0)
MCHC: 31.4 g/dL — AB (ref 31.5–35.7)
MCV: 88 fL (ref 79–97)
MONOCYTES: 8 %
Monocytes Absolute: 0.5 10*3/uL (ref 0.1–0.9)
NEUTROS ABS: 2.8 10*3/uL (ref 1.4–7.0)
Neutrophils: 45 %
PLATELETS: 189 10*3/uL (ref 150–379)
RBC: 4.48 x10E6/uL (ref 3.77–5.28)
RDW: 15.5 % — AB (ref 12.3–15.4)
WBC: 6.2 10*3/uL (ref 3.4–10.8)

## 2016-06-04 ENCOUNTER — Ambulatory Visit
Admission: RE | Admit: 2016-06-04 | Discharge: 2016-06-04 | Disposition: A | Discharge status: Home / Self Care | Payer: Medicare Other | Source: Ambulatory Visit | Attending: Family Medicine | Admitting: Family Medicine

## 2016-06-04 ENCOUNTER — Encounter: Payer: Self-pay | Admitting: Family Medicine

## 2016-06-04 DIAGNOSIS — Z1231 Encounter for screening mammogram for malignant neoplasm of breast: Secondary | ICD-10-CM | POA: Diagnosis not present

## 2016-06-04 NOTE — Progress Notes (Signed)
Cardiology Office Note   Date:  06/05/2016   ID:  Bianca [name], DOB 08/26/1930, MRN [id number]  PCP:  Bianca Dolly, MD    No chief complaint on file. AFib   Wt Readings from Last 3 Encounters:  06/05/16 225 lb 12.8 oz (102.4 kg)  05/31/16 228 lb 9.6 oz (103.7 kg)  12/12/15 227 lb (103 kg)       History of Present Illness: Bianca [name] is a 83 y.o. female  Permanent atrial fibrillation and chronic diastolic heart failure.  Echo in 12/17 showed:  Left ventricle: The cavity size was normal. There was mild   concentric hypertrophy. Systolic function was normal. Wall motion   was normal; there were no regional wall motion abnormalities. - Aortic valve: There was no regurgitation. - Ascending aorta: The ascending aorta was moderately dilated   measuring 47 mm. - Mitral valve: Mildly thickened leaflets . There was mild   regurgitation. - Left atrium: The atrium was severely dilated. - Right ventricle: The cavity size was mildly dilated. Wall   thickness was normal. Systolic function was mildly reduced. - Right atrium: The atrium was moderately dilated. - Tricuspid valve: There was mild regurgitation. - Pulmonic valve: There was mild regurgitation. - Pulmonary arteries: Systolic pressure was mildly increased. PA   peak pressure: 38 mm Hg (S). - Inferior vena cava: The vessel was normal in size.  MRA in 1/18 showed: Aneurysmal dilatation of the ascending thoracic aorta measuring up to 4.7 cm in maximal diameter. The aortic root/sinuses of Valsalva  do not show aneurysmal dilatation.   She reports some leg swelling.  Worse in the warm weather.  She lives alone.  She has some DOE but this is stable.  She takes care of herself but has some help 3x/week.    She has been taking Lasix only once a day, despite it being prescribed BID.  THe swelling does not bother her and she is ok with it as long as it is not dangerous.   Past Medical History:  Diagnosis Date  .  Atrial fibrillation ([place])   . Chronic diastolic heart failure ([place])    Echo 02/2008: EF 55%, MAC, mild MR, moderate BAE  . GERD (gastroesophageal reflux disease)   . Hx of cardiovascular stress test    Lexiscan Myoview (10/14):  Fixed inf defect, no ischemia, study not gated  . Hx of echocardiogram    Echo (11/14):  Mild LVH, EF 60-65%, mod to severe LAE, mod RVE, mild to mod reduced RVSF, mild RAE, PASP 38  . Hypertension   . Long term current use of anticoagulant   . Obesity   . Osteoarthritis   . Refusal of blood transfusions as patient is Jehovah's Witness     Past Surgical History:  Procedure Laterality Date  . ABDOMINAL HYSTERECTOMY    . CARPAL TUNNEL RELEASE    . CHOLECYSTECTOMY    . IRRIGATION AND DEBRIDEMENT SEBACEOUS CYST  1980   sebaceous cysts removed from tail bone  . KNEE ARTHROSCOPY Bilateral   . TUBAL LIGATION       Current Outpatient Prescriptions  Medication Sig Dispense Refill  . amLODipine-benazepril (LOTREL) 5-20 MG capsule Take 1 capsule by mouth daily. 90 capsule 3  . calcium-vitamin D (OSCAL WITH D 500-200) 500-200 MG-UNIT per tablet Take 1 tablet by mouth daily.      . Cholecalciferol (VITAMIN D-3) 1000 UNITS CAPS Take 1,000 Units by mouth daily.     . clotrimazole (  LOTRIMIN) 1 % cream Apply 1 application topically 2 (two) times daily. 30 g 0  . diclofenac sodium (VOLTAREN) 1 % GEL Apply 2 g topically 4 (four) times daily. 1 Tube 3  . ELIQUIS 5 MG TABS tablet Take 1 tablet by mouth twice daily. 60 tablet 9  . fish oil-omega-3 fatty acids 1000 MG capsule Take 2 g by mouth daily.    . furosemide (LASIX) 40 MG tablet Take 1.5 tablets (60 mg total) by mouth 2 (two) times daily. 60 tablet 2  . gabapentin (NEURONTIN) 100 MG capsule Take 1 capsule in the morning at 2 capsules at night 120 capsule 3  . Multiple Vitamin (MULTIVITAMIN) tablet Take 1 tablet by mouth daily.      Marland Kitchen spironolactone (ALDACTONE) 25 MG tablet 1/2 tablet (12.15m) daily 45 tablet 1  .  acetaminophen (TYLENOL 8 HOUR) 650 MG CR tablet Take 1 tablet (650 mg total) by mouth every 8 (eight) hours as needed for pain. (Patient not taking: Reported on 05/31/2016) 30 tablet 3  . nitroGLYCERIN (NITROSTAT) 0.4 MG SL tablet Place 1 tablet (0.4 mg total) under the tongue every 5 (five) minutes as needed for chest pain. (Patient not taking: Reported on 05/31/2016) 25 tablet 3   No current facility-administered medications for this visit.     Allergies:   Hydrocodone; Morphine; and Penicillins    Social History:  The patient  reports that she quit smoking about 47 years ago. Her smoking use included Cigarettes. She has never used smokeless tobacco. She reports that she does not drink alcohol or use drugs.   Family History:  The patient's family history includes Asthma in her mother; Breast cancer in her daughter; Cancer in her daughter; Clotting disorder in her daughter; Colon cancer in her mother; Depression in her daughter, daughter, and daughter; Diabetes in her brother and sister; Kidney cancer in her son.    ROS:  Please see the history of present illness.   Otherwise, review of systems are positive for ankle swelling.   All other systems are reviewed and negative.    PHYSICAL EXAM: VS:  BP 140/90 (BP Location: Left Arm, Patient Position: Sitting, Cuff Size: Large)   Pulse 64   Ht _0  (1.676 m)   Wt 225 lb 12.8 oz (102.4 kg)   SpO2 96%   BMI 36.45 kg/m  , BMI Body mass index is 36.45 kg/m. GEN: Well nourished, well developed, in no acute distress  HEENT: normal  Neck: no JVD, carotid bruits, or masses Cardiac: irregularly irregular; no murmurs, rubs, or gallops; bilateral ankle edema  Respiratory:  clear to auscultation bilaterally, normal work of breathing GI: soft, nontender, nondistended, + BS MS: no deformity or atrophy  Skin: warm and dry, no rash Neuro:  Strength and sensation are intact Psych: euthymic mood, full affect   EKG:   The ekg ordered in 11/17  demonstrates AFib, controlled ventricular response   Recent Labs: 11/20/2015: ALT 11; Hemoglobin 12.2 12/18/2015: Brain Natriuretic Peptide 151.2 05/31/2016: BUN 12; Creatinine, Ser 0.70; Platelets 189; Potassium 4.5; Sodium 140   Lipid Panel    Component Value Date/Time   CHOL 143 12/21/2014 1523   TRIG 114 12/21/2014 1523   HDL 40 (L) 12/21/2014 1523   CHOLHDL 3.6 12/21/2014 1523   VLDL 23 12/21/2014 1523   LDLCALC 80 12/21/2014 1523   LDLDIRECT 119 (H) 06/14/2009 1819     Other studies Reviewed: Additional studies/ records that were reviewed today with results demonstrating: .2017 echo reviewed.  5/18 blood tests reviewed   ASSESSMENT AND PLAN:  1. Afib: Rate controlled.  Continue current meds.  Eliquis for stroke prevention. 2. Chronic diastolic heart failure: Appears euvolemic.  Can take a second dose of Lasix at about 2 PM in addition to the AM dose if she feels volume overloaded. 3. Dilated aortic root: We discussed referral to cardiac surgery but she would like to hold off.  Plan MRA scheduled for July 2018.  4. Hypertensive heart disease: BP borderline today.  She checks at home.  Typically in the 127N systolic.  COntinue current meds.  She was a nurses aide in the past.    Current medicines are reviewed at length with the patient today.  The patient concerns regarding her medicines were addressed.  The following changes have been made:  No change  Labs/ tests ordered today include:  No orders of the defined types were placed in this encounter.   Recommend 150 minutes/week of aerobic exercise Low fat, low carb, high fiber diet recommended  Disposition:   FU in    Signed, Larae Grooms, MD  06/05/2016 [facility] [place], [place], [place]  [address] Phone: [phone]; Fax: [phone]

## 2016-06-05 ENCOUNTER — Encounter: Payer: Self-pay | Admitting: Interventional Cardiology

## 2016-06-05 ENCOUNTER — Ambulatory Visit (INDEPENDENT_AMBULATORY_CARE_PROVIDER_SITE_OTHER): Payer: Medicare Other | Admitting: Interventional Cardiology

## 2016-06-05 VITALS — BP 140/90 | HR 64 | Ht 66.0 in | Wt 225.8 lb

## 2016-06-05 DIAGNOSIS — I5032 Chronic diastolic (congestive) heart failure: Secondary | ICD-10-CM | POA: Diagnosis not present

## 2016-06-05 DIAGNOSIS — I712 Thoracic aortic aneurysm, without rupture, unspecified: Secondary | ICD-10-CM | POA: Insufficient documentation

## 2016-06-05 DIAGNOSIS — I481 Persistent atrial fibrillation: Secondary | ICD-10-CM | POA: Diagnosis not present

## 2016-06-05 DIAGNOSIS — I119 Hypertensive heart disease without heart failure: Secondary | ICD-10-CM | POA: Insufficient documentation

## 2016-06-05 DIAGNOSIS — I110 Hypertensive heart disease with heart failure: Secondary | ICD-10-CM | POA: Diagnosis not present

## 2016-06-05 DIAGNOSIS — I4819 Other persistent atrial fibrillation: Secondary | ICD-10-CM

## 2016-06-05 NOTE — Patient Instructions (Signed)
Medication Instructions:  Your physician recommends that you continue on your current medications as directed. Please refer to the Current Medication list given to you today.   Labwork: None ordered.  Testing/Procedures: Keep your appointment for the Chest MRA scheduled for July 29, 2016 at 9:50 am.  Follow-Up: Your physician wants you to follow-up in: 8 months with Dr. Irish Lack. You will receive a reminder letter in the mail two months in advance. If you don't receive a letter, please call our office to schedule the follow-up appointment.  Any Other Special Instructions Will Be Listed Below (If Applicable).     If you need a refill on your cardiac medications before your next appointment, please call your pharmacy.

## 2016-06-06 NOTE — Assessment & Plan Note (Signed)
Suspect this is from OA. Will prescribe voltaren gel. Follow up as needed

## 2016-06-06 NOTE — Assessment & Plan Note (Signed)
Bilateral ankle edema. Could be from amlodipine vs dependent edema. Less likely fluid overload from heart failure. Encouraged patient to use compression stockings, she has some at home. Will follow up with cardiologist next week.

## 2016-06-14 ENCOUNTER — Other Ambulatory Visit: Payer: Self-pay | Admitting: Family Medicine

## 2016-06-14 DIAGNOSIS — I4819 Other persistent atrial fibrillation: Secondary | ICD-10-CM

## 2016-06-14 DIAGNOSIS — I5032 Chronic diastolic (congestive) heart failure: Secondary | ICD-10-CM

## 2016-06-14 DIAGNOSIS — I10 Essential (primary) hypertension: Secondary | ICD-10-CM

## 2016-06-14 DIAGNOSIS — R0602 Shortness of breath: Secondary | ICD-10-CM

## 2016-07-10 ENCOUNTER — Other Ambulatory Visit: Payer: Self-pay | Admitting: *Deleted

## 2016-07-10 MED ORDER — FUROSEMIDE 40 MG PO TABS: 60.0000 mg | ORAL_TABLET | Freq: Two times a day (BID) | ORAL | 2 refills | Status: DC

## 2016-07-10 MED ORDER — GABAPENTIN 100 MG PO CAPS: ORAL_CAPSULE | ORAL | 3 refills | Status: DC

## 2016-07-10 NOTE — Telephone Encounter (Signed)
Refill request for 90 day supply.  Devun Anna L, RN  

## 2016-07-22 ENCOUNTER — Telehealth: Payer: Self-pay | Admitting: Family Medicine

## 2016-07-22 NOTE — Telephone Encounter (Signed)
Pt called and wold like to have a referral to a ortho doctor so that she can get shots in her knees. jw

## 2016-07-23 NOTE — Telephone Encounter (Signed)
Called patient. She has b/l knee arthritis. Discussed with her that we do need injections at our clinic as well. She wanted to get into visit as soon as possible. Patient will schedule an appointment with our clinic for steroid injections of the knees. All questions answered. Patient appreciative of the call.  Smitty Cords, MD [place], PGY-3

## 2016-07-25 ENCOUNTER — Encounter: Payer: Self-pay | Admitting: Student in an Organized Health Care Education/Training Program

## 2016-07-25 ENCOUNTER — Ambulatory Visit (INDEPENDENT_AMBULATORY_CARE_PROVIDER_SITE_OTHER): Payer: Medicare Other | Admitting: Student in an Organized Health Care Education/Training Program

## 2016-07-25 VITALS — BP 132/84 | HR 92 | Temp 98.0°F | Ht 66.0 in | Wt 226.0 lb

## 2016-07-25 DIAGNOSIS — M170 Bilateral primary osteoarthritis of knee: Secondary | ICD-10-CM

## 2016-07-25 MED ORDER — DICLOFENAC SODIUM 1 % TD GEL: 2.0000 g | Freq: Four times a day (QID) | TRANSDERMAL | 3 refills | Status: DC

## 2016-07-25 MED ORDER — METHYLPREDNISOLONE ACETATE 80 MG/ML IJ SUSP
80.0000 mg | Freq: Once | INTRAMUSCULAR | Status: AC
  Administered 2016-07-25: 80 mg via INTRAMUSCULAR

## 2016-07-25 NOTE — Assessment & Plan Note (Signed)
-   Right and Left Knee injections performed in the office today - return precautions advised - Patient never got her voltaren gel from previous visit because she stated that the pharmacy never delivered it - reordered for [facility] today

## 2016-07-25 NOTE — Progress Notes (Signed)
   CC: bilateral knee pain  HPI: Bianca [name] is a 83 y.o. female with PMH significant for osteoarthritis who presents to [facility] today with bilateral knee pain of several months duration.  Pain is worse with walking and improves with rest. She endorses using aspira-cream and tylenol at home without improvement. She states she has had injections in the past (>1 year ago) with improvement in her symptoms at that time.   She is interested in having steroid injections to the knees again today.   Denies recent fevers, chills, weight loss. Denies redness, swelling, or warmth of the knees.   Review of Symptoms:  See HPI for ROS.   CC, SH/smoking status, and VS noted.  Objective: BP 132/84   Pulse 92   Temp 98 F (36.7 C) (Oral)   Ht 5\' 6"  (1.676 m)   Wt 226 lb (102.5 kg)   SpO2 98%   BMI 36.48 kg/m  GEN: NAD, alert, cooperative, and pleasant. SKIN: warm and dry, no rashes or lesions MSK: full ROM of bil knee without joint laxity or crepitus, no erythema, warmth or edema NEURO: II-XII grossly intact, normal gait, peripheral sensation intact  INJECTION Right Knee: Patient was given informed consent, signed copy in the chart. Appropriate time out was taken. Area prepped and draped in usual sterile fashion. 0.5 cc of methylprednisolone 80 mg/ml plus  4.5 cc of 1% lidocaine without epinephrine was injected into the Right Knee using a lateral approach. The patient tolerated the procedure well. There were no complications. Post procedure instructions were given.  INJECTION Left Knee: Patient was given informed consent, signed copy in the chart. Appropriate time out was taken. Area prepped and draped in usual sterile fashion. 0.5 cc of methylprednisolone 80 mg/ml plus  4.5 cc of 1% lidocaine without epinephrine was injected into the Left Knee using a medial approach. The patient tolerated the procedure well. There were no complications. Post procedure instructions were given.  Assessment and  plan:  Osteoarthritis of both knees - Right and Left Knee injections performed in the office today - return precautions advised - Patient never got her voltaren gel from previous visit because she stated that the pharmacy never delivered it - reordered for [facility] today   Meds ordered this encounter  Medications  . diclofenac sodium (VOLTAREN) 1 % GEL    Sig: Apply 2 g topically 4 (four) times daily.    Dispense:  1 Tube    Refill:  3  . methylPREDNISolone acetate (DEPO-MEDROL) injection 80 mg    Everrett Coombe, MD,MS,  PGY2 07/25/2016 4:07 PM

## 2016-07-25 NOTE — Patient Instructions (Addendum)
It was a pleasure seeing you today in our clinic. Today we did knee injections to help with your knee pain.  You may have some soreness in the area of the injections. If you notice redness, warmth or fevers these would be reasons to call our office or come back in to be seen.  Our clinic's number is [phone]. Please call with questions or concerns about what we discussed today.  Be well, Dr. Burr Medico

## 2016-07-29 ENCOUNTER — Ambulatory Visit
Admission: RE | Admit: 2016-07-29 | Discharge: 2016-07-29 | Disposition: A | Discharge status: Home / Self Care | Payer: Medicare Other | Source: Ambulatory Visit | Attending: Cardiology | Admitting: Cardiology

## 2016-07-29 DIAGNOSIS — I712 Thoracic aortic aneurysm, without rupture: Secondary | ICD-10-CM | POA: Diagnosis not present

## 2016-07-29 DIAGNOSIS — I77810 Thoracic aortic ectasia: Secondary | ICD-10-CM

## 2016-07-29 MED ORDER — GADOBENATE DIMEGLUMINE 529 MG/ML IV SOLN
20.0000 mL | Freq: Once | INTRAVENOUS | Status: AC | PRN
  Administered 2016-07-29: 20 mL via INTRAVENOUS

## 2016-07-31 ENCOUNTER — Telehealth: Payer: Self-pay | Admitting: Interventional Cardiology

## 2016-07-31 NOTE — Telephone Encounter (Signed)
Patient informed. 

## 2016-07-31 NOTE — Telephone Encounter (Signed)
New message    Pt is calling about her results. She was not home yesterday when someone called. Please call.

## 2016-08-03 ENCOUNTER — Other Ambulatory Visit: Payer: Self-pay | Admitting: Family Medicine

## 2016-08-03 DIAGNOSIS — R0602 Shortness of breath: Secondary | ICD-10-CM

## 2016-08-03 DIAGNOSIS — I10 Essential (primary) hypertension: Secondary | ICD-10-CM

## 2016-08-03 DIAGNOSIS — I4819 Other persistent atrial fibrillation: Secondary | ICD-10-CM

## 2016-08-03 DIAGNOSIS — I5032 Chronic diastolic (congestive) heart failure: Secondary | ICD-10-CM

## 2016-09-02 ENCOUNTER — Other Ambulatory Visit: Payer: Self-pay | Admitting: Family Medicine

## 2016-09-11 DIAGNOSIS — H04123 Dry eye syndrome of bilateral lacrimal glands: Secondary | ICD-10-CM | POA: Diagnosis not present

## 2016-09-11 DIAGNOSIS — H40013 Open angle with borderline findings, low risk, bilateral: Secondary | ICD-10-CM | POA: Diagnosis not present

## 2016-10-02 ENCOUNTER — Other Ambulatory Visit: Payer: Self-pay | Admitting: Cardiology

## 2016-10-22 ENCOUNTER — Ambulatory Visit: Payer: Medicare Other | Admitting: *Deleted

## 2016-11-01 ENCOUNTER — Other Ambulatory Visit: Payer: Self-pay | Admitting: Family Medicine

## 2016-11-06 ENCOUNTER — Ambulatory Visit (INDEPENDENT_AMBULATORY_CARE_PROVIDER_SITE_OTHER): Payer: Medicare Other | Admitting: *Deleted

## 2016-11-06 ENCOUNTER — Encounter: Payer: Self-pay | Admitting: *Deleted

## 2016-11-06 VITALS — BP 148/68 | HR 76 | Temp 98.5°F | Ht 66.0 in | Wt 226.2 lb

## 2016-11-06 DIAGNOSIS — Z0000 Encounter for general adult medical examination without abnormal findings: Secondary | ICD-10-CM

## 2016-11-06 DIAGNOSIS — Z1211 Encounter for screening for malignant neoplasm of colon: Secondary | ICD-10-CM

## 2016-11-06 NOTE — Patient Instructions (Addendum)
Bianca [name],  Thank you for taking time to come for yourMedicare Wellness Visit. I appreciate your ongoing commitment to your health goals. Please review the following plan we discussed and let me know if I can assist you in the future.   These are the goals we discussed:  Goals    . Reduce calorie intake to 2000 calories per day          Would like to restart weight watchers.     . Start water aerobis class          Plans to use her silver sneakers benefit to start water aerobics. Patient will call [employer] and [employer] to ask about benefits.       Fall Prevention in the Home Falls can cause injuries. They can happen to people of all ages. There are many things you can do to make your home safe and to help prevent falls. What can I do on the outside of my home?  Regularly fix the edges of walkways and driveways and fix any cracks.  Remove anything that might make you trip as you walk through a door, such as a raised step or threshold.  Trim any bushes or trees on the path to your home.  Use bright outdoor lighting.  Clear any walking paths of anything that might make someone trip, such as rocks or tools.  Regularly check to see if handrails are loose or broken. Make sure that both sides of any steps have handrails.  Any raised decks and porches should have guardrails on the edges.  Have any leaves, snow, or ice cleared regularly.  Use sand or salt on walking paths during winter.  Clean up any spills in your garage right away. This includes oil or grease spills. What can I do in the bathroom?  Use night lights.  Install grab bars by the toilet and in the tub and shower. Do not use towel bars as grab bars.  Use non-skid mats or decals in the tub or shower.  If you need to sit down in the shower, use a plastic, non-slip stool.  Keep the floor dry. Clean up any water that spills on the floor as soon as it happens.  Remove soap buildup in the tub or shower  regularly.  Attach bath mats securely with double-sided non-slip rug tape.  Do not have throw rugs and other things on the floor that can make you trip. What can I do in the bedroom?  Use night lights.  Make sure that you have a light by your bed that is easy to reach.  Do not use any sheets or blankets that are too big for your bed. They should not hang down onto the floor.  Have a firm chair that has side arms. You can use this for support while you get dressed.  Do not have throw rugs and other things on the floor that can make you trip. What can I do in the kitchen?  Clean up any spills right away.  Avoid walking on wet floors.  Keep items that you use a lot in easy-to-reach places.  If you need to reach something above you, use a strong step stool that has a grab bar.  Keep electrical cords out of the way.  Do not use floor polish or wax that makes floors slippery. If you must use wax, use non-skid floor wax.  Do not have throw rugs and other things on the floor that can make you  trip. What can I do with my stairs?  Do not leave any items on the stairs.  Make sure that there are handrails on both sides of the stairs and use them. Fix handrails that are broken or loose. Make sure that handrails are as long as the stairways.  Check any carpeting to make sure that it is firmly attached to the stairs. Fix any carpet that is loose or worn.  Avoid having throw rugs at the top or bottom of the stairs. If you do have throw rugs, attach them to the floor with carpet tape.  Make sure that you have a light switch at the top of the stairs and the bottom of the stairs. If you do not have them, ask someone to add them for you. What else can I do to help prevent falls?  Wear shoes that: ? Do not have high heels. ? Have rubber bottoms. ? Are comfortable and fit you well. ? Are closed at the toe. Do not wear sandals.  If you use a stepladder: ? Make sure that it is fully  opened. Do not climb a closed stepladder. ? Make sure that both sides of the stepladder are locked into place. ? Ask someone to hold it for you, if possible.  Clearly mark and make sure that you can see: ? Any grab bars or handrails. ? First and last steps. ? Where the edge of each step is.  Use tools that help you move around (mobility aids) if they are needed. These include: ? Canes. ? Walkers. ? Scooters. ? Crutches.  Turn on the lights when you go into a dark area. Replace any light bulbs as soon as they burn out.  Set up your furniture so you have a clear path. Avoid moving your furniture around.  If any of your floors are uneven, fix them.  If there are any pets around you, be aware of where they are.  Review your medicines with your doctor. Some medicines can make you feel dizzy. This can increase your chance of falling. Ask your doctor what other things that you can do to help prevent falls. This information is not intended to replace advice given to you by your health care provider. Make sure you discuss any questions you have with your health care provider. Document Released: 10/27/2008 Document Revised: 06/08/2015 Document Reviewed: 02/04/2014 Elsevier Interactive Patient Education  2018 [place] Maintenance, Female Adopting a healthy lifestyle and getting preventive care can go a long way to promote health and wellness. Talk with your health care provider about what schedule of regular examinations is right for you. This is a good chance for you to check in with your provider about disease prevention and staying healthy. In between checkups, there are plenty of things you can do on your own. Experts have done a lot of research about which lifestyle changes and preventive measures are most likely to keep you healthy. Ask your health care provider for more information. Weight and diet Eat a healthy diet  Be sure to include plenty of vegetables, fruits,  low-fat dairy products, and lean protein.  Do not eat a lot of foods high in solid fats, added sugars, or salt.  Get regular exercise. This is one of the most important things you can do for your health. ? Most adults should exercise for at least 150 minutes each week. The exercise should increase your heart rate and make you sweat (moderate-intensity exercise). ? Most adults should  also do strengthening exercises at least twice a week. This is in addition to the moderate-intensity exercise.  Maintain a healthy weight  Body mass index (BMI) is a measurement that can be used to identify possible weight problems. It estimates body fat based on height and weight. Your health care provider can help determine your BMI and help you achieve or maintain a healthy weight.  For females 64 years of age and older: ? A BMI below 18.5 is considered underweight. ? A BMI of 18.5 to 24.9 is normal. ? A BMI of 25 to 29.9 is considered overweight. ? A BMI of 30 and above is considered obese.  Watch levels of cholesterol and blood lipids  You should start having your blood tested for lipids and cholesterol at 81 years of age, then have this test every 5 years.  You may need to have your cholesterol levels checked more often if: ? Your lipid or cholesterol levels are high. ? You are older than 81 years of age. ? You are at high risk for heart disease.  Cancer screening Lung Cancer  Lung cancer screening is recommended for adults 76-84 years old who are at high risk for lung cancer because of a history of smoking.  A yearly low-dose CT scan of the lungs is recommended for people who: ? Currently smoke. ? Have quit within the past 15 years. ? Have at least a 30-pack-year history of smoking. A pack year is smoking an average of one pack of cigarettes a day for 1 year.  Yearly screening should continue until it has been 15 years since you quit.  Yearly screening should stop if you develop a health  problem that would prevent you from having lung cancer treatment.  Breast Cancer  Practice breast self-awareness. This means understanding how your breasts normally appear and feel.  It also means doing regular breast self-exams. Let your health care provider know about any changes, no matter how small.  If you are in your 20s or 30s, you should have a clinical breast exam (CBE) by a health care provider every 1-3 years as part of a regular health exam.  If you are 83 or older, have a CBE every year. Also consider having a breast X-ray (mammogram) every year.  If you have a family history of breast cancer, talk to your health care provider about genetic screening.  If you are at high risk for breast cancer, talk to your health care provider about having an MRI and a mammogram every year.  Breast cancer gene (BRCA) assessment is recommended for women who have family members with BRCA-related cancers. BRCA-related cancers include: ? Breast. ? Ovarian. ? Tubal. ? Peritoneal cancers.  Results of the assessment will determine the need for genetic counseling and BRCA1 and BRCA2 testing.  Cervical Cancer Your health care provider may recommend that you be screened regularly for cancer of the pelvic organs (ovaries, uterus, and vagina). This screening involves a pelvic examination, including checking for microscopic changes to the surface of your cervix (Pap test). You may be encouraged to have this screening done every 3 years, beginning at age 73.  For women ages 73-65, health care providers may recommend pelvic exams and Pap testing every 3 years, or they may recommend the Pap and pelvic exam, combined with testing for human papilloma virus (HPV), every 5 years. Some types of HPV increase your risk of cervical cancer. Testing for HPV may also be done on women of any age with  unclear Pap test results.  Other health care providers may not recommend any screening for nonpregnant women who are  considered low risk for pelvic cancer and who do not have symptoms. Ask your health care provider if a screening pelvic exam is right for you.  If you have had past treatment for cervical cancer or a condition that could lead to cancer, you need Pap tests and screening for cancer for at least 20 years after your treatment. If Pap tests have been discontinued, your risk factors (such as having a new sexual partner) need to be reassessed to determine if screening should resume. Some women have medical problems that increase the chance of getting cervical cancer. In these cases, your health care provider may recommend more frequent screening and Pap tests.  Colorectal Cancer  This type of cancer can be detected and often prevented.  Routine colorectal cancer screening usually begins at 81 years of age and continues through 81 years of age.  Your health care provider may recommend screening at an earlier age if you have risk factors for colon cancer.  Your health care provider may also recommend using home test kits to check for hidden blood in the stool.  A small camera at the end of a tube can be used to examine your colon directly (sigmoidoscopy or colonoscopy). This is done to check for the earliest forms of colorectal cancer.  Routine screening usually begins at age 65.  Direct examination of the colon should be repeated every 5-10 years through 81 years of age. However, you may need to be screened more often if early forms of precancerous polyps or small growths are found.  Skin Cancer  Check your skin from head to toe regularly.  Tell your health care provider about any new moles or changes in moles, especially if there is a change in a mole's shape or color.  Also tell your health care provider if you have a mole that is larger than the size of a pencil eraser.  Always use sunscreen. Apply sunscreen liberally and repeatedly throughout the day.  Protect yourself by wearing long  sleeves, pants, a wide-brimmed hat, and sunglasses whenever you are outside.  Heart disease, diabetes, and high blood pressure  High blood pressure causes heart disease and increases the risk of stroke. High blood pressure is more likely to develop in: ? People who have blood pressure in the high end of the normal range (130-139/85-89 mm Hg). ? People who are overweight or obese. ? People who are African American.  If you are 56-84 years of age, have your blood pressure checked every 3-5 years. If you are 53 years of age or older, have your blood pressure checked every year. You should have your blood pressure measured twice-once when you are at a hospital or clinic, and once when you are not at a hospital or clinic. Record the average of the two measurements. To check your blood pressure when you are not at a hospital or clinic, you can use: ? An automated blood pressure machine at a pharmacy. ? A home blood pressure monitor.  If you are between 16 years and 72 years old, ask your health care provider if you should take aspirin to prevent strokes.  Have regular diabetes screenings. This involves taking a blood sample to check your fasting blood sugar level. ? If you are at a normal weight and have a low risk for diabetes, have this test once every three years after 81 years  of age. ? If you are overweight and have a high risk for diabetes, consider being tested at a younger age or more often. Preventing infection Hepatitis B  If you have a higher risk for hepatitis B, you should be screened for this virus. You are considered at high risk for hepatitis B if: ? You were born in a country where hepatitis B is common. Ask your health care provider which countries are considered high risk. ? Your parents were born in a high-risk country, and you have not been immunized against hepatitis B (hepatitis B vaccine). ? You have HIV or AIDS. ? You use needles to inject street drugs. ? You live with  someone who has hepatitis B. ? You have had sex with someone who has hepatitis B. ? You get hemodialysis treatment. ? You take certain medicines for conditions, including cancer, organ transplantation, and autoimmune conditions.  Hepatitis C  Blood testing is recommended for: ? Everyone born from [birth year] through 1963. ? Anyone with known risk factors for hepatitis C.  Sexually transmitted infections (STIs)  You should be screened for sexually transmitted infections (STIs) including gonorrhea and chlamydia if: ? You are sexually active and are younger than 81 years of age. ? You are older than 81 years of age and your health care provider tells you that you are at risk for this type of infection. ? Your sexual activity has changed since you were last screened and you are at an increased risk for chlamydia or gonorrhea. Ask your health care provider if you are at risk.  If you do not have HIV, but are at risk, it may be recommended that you take a prescription medicine daily to prevent HIV infection. This is called pre-exposure prophylaxis (PrEP). You are considered at risk if: ? You are sexually active and do not regularly use condoms or know the HIV status of your partner(s). ? You take drugs by injection. ? You are sexually active with a partner who has HIV.  Talk with your health care provider about whether you are at high risk of being infected with HIV. If you choose to begin PrEP, you should first be tested for HIV. You should then be tested every 3 months for as long as you are taking PrEP. Pregnancy  If you are premenopausal and you may become pregnant, ask your health care provider about preconception counseling.  If you may become pregnant, take 400 to 800 micrograms (mcg) of folic acid every day.  If you want to prevent pregnancy, talk to your health care provider about birth control (contraception). Osteoporosis and menopause  Osteoporosis is a disease in which the bones lose  minerals and strength with aging. This can result in serious bone fractures. Your risk for osteoporosis can be identified using a bone density scan.  If you are 67 years of age or older, or if you are at risk for osteoporosis and fractures, ask your health care provider if you should be screened.  Ask your health care provider whether you should take a calcium or vitamin D supplement to lower your risk for osteoporosis.  Menopause may have certain physical symptoms and risks.  Hormone replacement therapy may reduce some of these symptoms and risks. Talk to your health care provider about whether hormone replacement therapy is right for you. Follow these instructions at home:  Schedule regular health, dental, and eye exams.  Stay current with your immunizations.  Do not use any tobacco products including cigarettes, chewing tobacco,  or electronic cigarettes.  If you are pregnant, do not drink alcohol.  If you are breastfeeding, limit how much and how often you drink alcohol.  Limit alcohol intake to no more than 1 drink per day for nonpregnant women. One drink equals 12 ounces of beer, 5 ounces of wine, or 1 ounces of hard liquor.  Do not use street drugs.  Do not share needles.  Ask your health care provider for help if you need support or information about quitting drugs.  Tell your health care provider if you often feel depressed.  Tell your health care provider if you have ever been abused or do not feel safe at home. This information is not intended to replace advice given to you by your health care provider. Make sure you discuss any questions you have with your health care provider. Document Released: 07/16/2010 Document Revised: 06/08/2015 Document Reviewed: 10/04/2014 Elsevier Interactive Patient Education  [employer].

## 2016-11-06 NOTE — Progress Notes (Signed)
Subjective:   Bianca [name] is a 83 y.o. female who presents for Medicare Annual (Subsequent) preventive examination.  Cardiac Risk Factors include: advanced age (>42mn, >>6women);hypertension;obesity (BMI >30kg/m2);smoking/ tobacco exposure     Objective:    Vitals: BP (!) 148/68 (BP Location: Right Arm, Patient Position: Sitting, Cuff Size: Large)   Pulse 76   Temp 98.5 F (36.9 C) (Oral)   Ht _0  (1.676 m)   Wt 226 lb 3.2 oz (102.6 kg)   SpO2 99%   BMI 36.51 kg/m   Body mass index is 36.51 kg/m.   Tobacco History  Smoking Status  . Former Smoker  . Packs/day: 0.50  . Years: 10.00  . Types: Cigarettes  . Quit date: 08/13/1968  Smokeless Tobacco  . Never Used     Counseling given: Yes Patient is former smoker with no plans to restart   Past Medical History:  Diagnosis Date  . Atrial fibrillation ([place]   . Chronic diastolic heart failure ([place]    Echo 02/2008: EF 55%, MAC, mild MR, moderate BAE  . GERD (gastroesophageal reflux disease)   . Hx of cardiovascular stress test    Lexiscan Myoview (10/14):  Fixed inf defect, no ischemia, study not gated  . Hx of echocardiogram    Echo (11/14):  Mild LVH, EF 60-65%, mod to severe LAE, mod RVE, mild to mod reduced RVSF, mild RAE, PASP 38  . Hypertension   . Long term current use of anticoagulant   . Obesity   . Osteoarthritis   . Refusal of blood transfusions as patient is Jehovah's Witness    Past Surgical History:  Procedure Laterality Date  . ABDOMINAL HYSTERECTOMY    . CARPAL TUNNEL RELEASE    . CHOLECYSTECTOMY    . IRRIGATION AND DEBRIDEMENT SEBACEOUS CYST  1980   sebaceous cysts removed from tail bone  . KNEE ARTHROSCOPY Bilateral   . TUBAL LIGATION     Family History  Problem Relation Age of Onset  . Asthma Mother   . Colon cancer Mother   . Cancer Mother   . Depression Daughter   . Diabetes Unknown        sibling  . Hypertension Unknown        sibling  . Diabetes Sister   . Diabetes  Brother   . Kidney cancer Son   . Cancer Son   . Cancer Daughter   . Breast cancer Daughter   . Depression Daughter   . Clotting disorder Daughter   . Depression Daughter   . Heart attack Neg Hx    History  Sexual Activity  . Sexual activity: No    Outpatient Encounter Prescriptions as of 11/06/2016  Medication Sig  . acetaminophen (TYLENOL 8 HOUR) 650 MG CR tablet Take 1 tablet (650 mg total) by mouth every 8 (eight) hours as needed for pain.  .Marland KitchenamLODipine-benazepril (LOTREL) 5-20 MG capsule Take 1 capsule by mouth daily.  . Cholecalciferol (VITAMIN D-3) 1000 UNITS CAPS Take 1,000 Units by mouth daily.   . clotrimazole (LOTRIMIN) 1 % cream Apply 1 application topically 2 (two) times daily.  . diclofenac sodium (VOLTAREN) 1 % GEL Apply 2 g topically 4 (four) times daily.  .Marland KitchenELIQUIS 5 MG TABS tablet Take 1 tablet by mouth twice daily.  . fish oil-omega-3 fatty acids 1000 MG capsule Take 2 g by mouth daily.  . furosemide (LASIX) 40 MG tablet Take 1.5 tablets (60 mg total) by mouth 2 (two) times daily.  .Marland Kitchen  Multiple Vitamin (MULTIVITAMIN) tablet Take 1 tablet by mouth daily.    . nitroGLYCERIN (NITROSTAT) 0.4 MG SL tablet Place 1 tablet (0.4 mg total) under the tongue every 5 (five) minutes as needed for chest pain.  Marland Kitchen spironolactone (ALDACTONE) 25 MG tablet 1/2 tablet (12.31m) daily  . calcium-vitamin D (OSCAL WITH D 500-200) 500-200 MG-UNIT per tablet Take 1 tablet by mouth daily.    .Marland Kitchengabapentin (NEURONTIN) 100 MG capsule Take 1 capsule in the morning at 2 capsules at night (Patient not taking: Reported on 11/06/2016)   No facility-administered encounter medications on file as of 11/06/2016.     Activities of Daily Living In your present state of health, do you have any difficulty performing the following activities: 11/06/2016  Hearing? N  Vision? N  Difficulty concentrating or making decisions? N  Walking or climbing stairs? Y  Dressing or bathing? N  Doing errands, shopping?  Y  Preparing Food and eating ? N  Using the Toilet? N  In the past six months, have you accidently leaked urine? Y  Do you have problems with loss of bowel control? N  Managing your Medications? N  Managing your Finances? N  Housekeeping or managing your Housekeeping? Y  Some recent data might be hidden   Home Safety:  My home has a working smoke alarm:  Yes X 2            My home throw rugs have been fastened down to the floor or removed:  One under coffee table only I have a non-slip surface or non-slip mats in the bathtub and shower:  Non-slip surface, grab bar and shower chair        All my home's stairs have handrails, including any outdoor stairs  One level home with half outside steps with handrails          My home's floors, stairs and hallways are free from clutter, wires and cords:  Yes     I have animals in my home  No I wear seatbelts consistently:  Yes    Patient Care Team: GCarlyle Dolly MD as PCP - General (Family Medicine) HMonna Fam MD (Ophthalmology) MTrenton Gammon DFrederick(General Practice)    Assessment:     Exercise Activities and Dietary recommendations Current Exercise Habits: Home exercise routine, Type of exercise: walking, Time (Minutes): 30, Frequency (Times/Week): 7, Weekly Exercise (Minutes/Week): 210, Intensity: Moderate  Goals    . Reduce calorie intake to 2000 calories per day          Would like to restart weight watchers.     . Start water aerobis class          Plans to use her silver sneakers benefit to start water aerobics. Patient will call [employer] and [employer] to ask about benefits.      Fall Risk Fall Risk  11/06/2016 07/25/2016 05/31/2016 11/20/2015 08/14/2015  Falls in the past year? _0   Number falls in past yr: - - - - -  Risk Factor Category  - - - - -  Risk for fall due to : - - - - History of fall(s);Impaired mobility   Depression Screen PHQ 2/9 Scores 11/06/2016 07/25/2016 05/31/2016 11/20/2015  PHQ - 2  Score 1 0 0 0  PHQ- 9 Score - - - -    TUG Test:  Done in 13 seconds. Patient used one hand to push out of chair and to sit back down. Little arm swing noted.  Falls prevention discussed in detail and literature given.  Cognitive Function: Mini-Cog  Passed with score 3/5   Immunization History  Administered Date(s) Administered  . Influenza Whole 10/08/2006, 10/28/2007, 10/24/2008  . Influenza, High Dose Seasonal PF 10/15/2013, 09/26/2014, 10/14/2016  . Influenza,inj,Quad PF,6+ Mos 10/06/2012, 09/11/2015  . Pneumococcal Conjugate-13 05/03/2015  . Pneumococcal Polysaccharide-23 11/14/1997  . Td 01/14/1994, 01/14/2005, 10/22/2007  . Zoster 10/06/2012   Screening Tests Health Maintenance  Topic Date Due  . COLONOSCOPY  09/18/2013  . TETANUS/TDAP  10/21/2017  . INFLUENZA VACCINE  Completed  . DEXA SCAN  Completed  . PNA vac Low Risk Adult  Completed   Patient given FIT with instructions from lab staff Patient received Shingrix 10/28/2016 at [facility]:     Patient will make appt with PCP to discuss bilat leg pain Patient given FIT with instructions from lab staff Patient received Shingrix #1 10/28/2016 at [facility]  I have personally reviewed and noted the following in the patient's chart:   . Medical and social history . Use of alcohol, tobacco or illicit drugs  . Current medications and supplements . Functional ability and status . Nutritional status . Physical activity . Advanced directives . List of other physicians . Hospitalizations, surgeries, and ER visits in previous 12 months . Vitals . Screenings to include cognitive, depression, and falls . Referrals and appointments  In addition, I have reviewed and discussed with patient certain preventive protocols, quality metrics, and best practice recommendations. A written personalized care plan for preventive services as well as general preventive health recommendations were provided to patient.      Velora Heckler, RN  11/06/2016

## 2016-11-11 ENCOUNTER — Telehealth: Payer: Self-pay | Admitting: Family Medicine

## 2016-11-11 DIAGNOSIS — Z1211 Encounter for screening for malignant neoplasm of colon: Secondary | ICD-10-CM | POA: Diagnosis not present

## 2016-11-12 ENCOUNTER — Encounter: Payer: Self-pay | Admitting: *Deleted

## 2016-11-12 NOTE — Progress Notes (Signed)
Addendum: I have reviewed this visit and discussed with Lauren Ducatte, RN, BSN, and agree with her documentation 

## 2016-11-14 LAB — FECAL OCCULT BLOOD, IMMUNOCHEMICAL: Fecal Occult Bld: NEGATIVE

## 2016-11-20 ENCOUNTER — Encounter: Payer: Self-pay | Admitting: Family Medicine

## 2016-11-20 ENCOUNTER — Ambulatory Visit (INDEPENDENT_AMBULATORY_CARE_PROVIDER_SITE_OTHER): Payer: Medicare Other | Admitting: Family Medicine

## 2016-11-20 DIAGNOSIS — M25562 Pain in left knee: Secondary | ICD-10-CM | POA: Diagnosis not present

## 2016-11-20 DIAGNOSIS — G8929 Other chronic pain: Secondary | ICD-10-CM

## 2016-11-20 DIAGNOSIS — M170 Bilateral primary osteoarthritis of knee: Secondary | ICD-10-CM | POA: Diagnosis not present

## 2016-11-20 DIAGNOSIS — M25561 Pain in right knee: Secondary | ICD-10-CM

## 2016-11-20 MED ORDER — METHYLPREDNISOLONE ACETATE 40 MG/ML IJ SUSP
40.0000 mg | Freq: Once | INTRAMUSCULAR | Status: AC
  Administered 2016-11-20: 40 mg via INTRA_ARTICULAR

## 2016-11-20 MED ORDER — METHYLPREDNISOLONE ACETATE 40 MG/ML IJ SUSP
40.0000 mg | Freq: Once | INTRAMUSCULAR | Status: AC
  Administered 2016-11-20: 40 mg via INTRAMUSCULAR

## 2016-11-20 MED ORDER — DICLOFENAC SODIUM 1 % TD GEL: 2.0000 g | Freq: Three times a day (TID) | TRANSDERMAL | 3 refills | Status: DC | PRN

## 2016-11-20 NOTE — Progress Notes (Signed)
Subjective:    Patient ID: Bianca [name] , female   DOB: 1930/08/15 , 83 y.o..   MRN: [id number]  HPI  Bianca [name] is here for  Chief Complaint  Patient presents with  . Leg Pain  . Leg Swelling    1. Bilateral Knee Pain: Patient states that she's had knee pain for many years due to osteoarthritis. Pain is worse with ambulation. Notes that the veins behind her left knee hurt her even at rest. She has tried Tylenol and Gabapentin without relief. Has had knee injections in the past that helped her. She has been prescribed Voltaren gel in the past but her pillpack companyy never sent her the prescription. She would like to have her knees injected again today. She states that she has seen Dr. Noemi Chapel in the past for her leg pain.   Review of Systems: Per HPI.   Past Medical History: Patient Active Problem List   Diagnosis Date Noted  . Thoracic aortic aneurysm ([place]) 06/05/2016  . Hypertensive heart disease 06/05/2016  . Lower back pain 12/12/2015  . Neck pain on left side 06/28/2015  . Sprain of ankle 06/21/2015  . Annual physical exam 05/03/2015  . Rash 05/03/2015  . Bilateral leg paresthesia 12/13/2014  . Furuncle 09/13/2014  . At high risk for falls 05/11/2012  . Knee pain, bilateral 04/30/2012  . Long term (current) use of anticoagulants 08/15/2010  . Bilateral leg edema 06/06/2010  . ARTHRITIS, SHOULDERS, BILATERAL 09/08/2008  . Osteoarthritis of both knees 10/28/2007  . Obesity 06/04/2007  . DEPRESSIVE DISORDER 06/04/2007  . BENIGN NEOPLASM OF COLON 04/06/2007  . Atrial fibrillation ([place]) 12/08/2006  . Chronic diastolic heart failure ([place]) 12/08/2006  . HYPERTENSION, BENIGN ESSENTIAL 04/23/2006    Medications: reviewed   Social Hx:  reports that she quit smoking about 48 years ago. Her smoking use included cigarettes. She has a 5.00 pack-year smoking history. she has never used smokeless tobacco.   Objective:   BP 122/68   Pulse 83   Temp 98.1 F (36.7 C)  (Oral)   Ht 5\' 6"  (1.676 m)   Wt 226 lb 3.2 oz (102.6 kg)   SpO2 99%   BMI 36.51 kg/m  Physical Exam  Gen: NAD, alert, cooperative with exam, well-appearing Skin: no rashes, normal turgor  Bilateral Knees: No erythema. There is swelling noted in the medial joint compartment.  Palpation with positive joint line tenderness in medial and lateral sides. Left knee with palpable baker's cyst on posterior aspect. No patellar tenderness, or condyle tenderness. ROM full in flexion and extension and lower leg rotation. Ligaments with solid consistent endpoints including ACL, PCL, LCL, MCL. Patellar glide with crepitus. Patellar and quadriceps tendons unremarkable. Hamstring and quadriceps strength is normal.  Neurovascularly intact  Ankle/Foot: There is 1+ pedal edema bilaterally and swelling on lateral ankles. Normal ROM and strength.   INJECTION Right Knee: Patient was given informed consent, signed copy in the chart. Appropriate time out was taken. Area prepped and draped in usual sterile fashion. 0.5 cc of methylprednisolone 80 mg/ml plus  4.5 cc of 1% lidocaine without epinephrine was injected into the Right Knee using a lateral approach. The patient tolerated the procedure well. There were no complications. Post procedure instructions were given.  INJECTION Left Knee: Patient was given informed consent, signed copy in the chart. Appropriate time out was taken. Area prepped and draped in usual sterile fashion. 0.5 cc of methylprednisolone 80 mg/ml plus  4.5 cc of 1% lidocaine  without epinephrine was injected into the Left Knee using a medial approach. The patient tolerated the procedure well. There were no complications. Post procedure instructions were given.  Assessment & Plan:  Knee pain, bilateral Chronic knee pain from osteoarthritis. She also has a palpable baker's cyst behind left knee which is likely the source of the discomfort she is feeling in the left knee compared to the right.  Bilateral steroid injections given today. Encouraged patient to follow up with Dr. Noemi Chapel as she has seen him in the past for her legs. Printed rx for Voltaren gel and gave it to patient. Also encouraged patient to wear her compression stockings.   Meds ordered this encounter  Medications  . diclofenac sodium (VOLTAREN) 1 % GEL    Sig: Apply 2 g 3 (three) times daily as needed topically.    Dispense:  1 Tube    Refill:  3  . methylPREDNISolone acetate (DEPO-MEDROL) injection 40 mg  . methylPREDNISolone acetate (DEPO-MEDROL) injection 40 mg    Smitty Cords, MD [place], PGY-3

## 2016-11-20 NOTE — Assessment & Plan Note (Addendum)
Chronic knee pain from osteoarthritis. She also has a palpable baker's cyst behind left knee which is likely the source of the discomfort she is feeling in the left knee compared to the right. Bilateral steroid injections given today. Encouraged patient to follow up with Dr. Noemi Chapel as she has seen him in the past for her legs. Printed rx for Voltaren gel and gave it to patient. Also encouraged patient to wear her compression stockings.

## 2016-11-20 NOTE — Patient Instructions (Signed)
Thank you for coming in today, it was so nice to see you! Today we talked about:    Leg pain: We gave you knee injections today. Continue to move your knees today.   We gave you a printed prescription for Voltaren gel  Please follow up in 3 months if you need another knee injection. You can schedule this appointment at the front desk before you leave or call the clinic.  If you have any questions or concerns, please do not hesitate to call the office at [phone]. You can also message me directly via MyChart.   Sincerely,  Smitty Cords, MD

## 2016-11-29 ENCOUNTER — Other Ambulatory Visit: Payer: Self-pay | Admitting: Internal Medicine

## 2016-11-29 ENCOUNTER — Other Ambulatory Visit: Payer: Self-pay | Admitting: Family Medicine

## 2016-12-04 ENCOUNTER — Other Ambulatory Visit: Payer: Self-pay | Admitting: Family Medicine

## 2016-12-04 MED ORDER — FUROSEMIDE 40 MG PO TABS: 60.0000 mg | ORAL_TABLET | Freq: Two times a day (BID) | ORAL | 0 refills | Status: DC

## 2016-12-20 ENCOUNTER — Telehealth: Payer: Self-pay | Admitting: Interventional Cardiology

## 2016-12-20 NOTE — Telephone Encounter (Signed)
New message   [place] pt daughter verbalized that she called pt this morning and she is having heart pain since 6am  Pt c/o of Chest Pain: STAT if CP now or developed within 24 hours  1. Are you having CP right now? yes  2. Are you experiencing any other symptoms (ex. SOB, nausea, vomiting, sweating)? Sob and bausea  3. How long have you been experiencing CP? week 4. Is your CP continuous or coming and going? Coming and going   5. Have you taken Nitroglycerin? Unknown    She want the rn to call her mother and to try and get her in for an emergency appt [phone] ?

## 2016-12-20 NOTE — Telephone Encounter (Signed)
Called and spoke to patient per daughter's request. Patient states that she has been having intermittent chest discomfort for the past 2 weeks. She states that the discomfort feels "kind of like indigestion". She states that it does not feel like a sharp, burning, or tearing sensation. She states that it just feels uncomfortable. She states that the discomfort is in between her breasts and underneath her left breast. She states that the discomfort occurs at rest and happens a lot of times after she eats but can occur when she hasn't eaten anything. She states that the discomfort is relieved when she either drinks a soda or baking soda and can belch afterwards. She states that when that does not work she takes NTG x 1 with relief. She states that she does have some SOB and has felt weak lately. She denies having any arm pain, jaw pain, neck pain, back pain, sweating, or vomiting. She states that she did have some nausea about a week ago, but has not had any since. Patient had an episode this morning at 6 am. She states that she drank a soda and it went away. Patient denies having any chest discomfort or any other symptoms at this time. She does not have any vitals to offer. Changed patient's appointment to be seen by Dr. Irish Lack on Tuesday 12/24/16 at 11:00 AM. ER precautions reviewed with the patient. Patient verbalizes understanding and is in agreement. Called and made daughter aware of the plan. She verbalizes understanding and states that she will keep a close eye on her over the weekend. She thanked me for the call.

## 2016-12-24 ENCOUNTER — Ambulatory Visit: Payer: Medicare Other | Admitting: Interventional Cardiology

## 2016-12-25 NOTE — Progress Notes (Signed)
Cardiology Office Note   Date:  12/26/2016   ID:  Bianca [name], DOB November 16, 1930, MRN [id number]  PCP:  Bianca Dolly, MD    No chief complaint on file.  Chest pain  Wt Readings from Last 3 Encounters:  12/26/16 226 lb (102.5 kg)  11/20/16 226 lb 3.2 oz (102.6 kg)  11/06/16 226 lb 3.2 oz (102.6 kg)       History of Present Illness: Bianca [name] is a 83 y.o. female   Permanent atrial fibrillation and chronic diastolic heart failure.  Echo in 12/17 showed:  Left ventricle: The cavity size was normal. There was mild concentric hypertrophy. Systolic function was normal. Wall motion was normal; there were no regional wall motion abnormalities. - Aortic valve: There was no regurgitation. - Ascending aorta: The ascending aorta was moderately dilated measuring 47 mm. - Mitral valve: Mildly thickened leaflets . There was mild regurgitation. - Left atrium: The atrium was severely dilated. - Right ventricle: The cavity size was mildly dilated. Wall thickness was normal. Systolic function was mildly reduced. - Right atrium: The atrium was moderately dilated. - Tricuspid valve: There was mild regurgitation. - Pulmonic valve: There was mild regurgitation. - Pulmonary arteries: Systolic pressure was mildly increased. PA peak pressure: 38 mm Hg (S). - Inferior vena cava: The vessel was normal in size.  MRA in 1/18 showed: Aneurysmal dilatation of the ascending thoracic aorta measuring up to 4.7 cm in maximal diameter. The aortic root/sinuses of Valsalva  do not show aneurysmal dilatation.  She has had leg swelling in the past.  She had been taking daily Lasix, although it was prescribed twice a day.  In 12/18, she had some chest discomfort, started epigastric and went up into her chest.  She had drank some soda and that helped.  She ate some sausages that she would not normally eat , that evening.  No further since that time.   No bleeding trouble at this  time.   Denies :  Dizziness. Nitroglycerin use. Orthopnea. Palpitations. Paroxysmal nocturnal dyspnea. Shortness of breath. Syncope.   Chronic ankle edema.   Walking without problems.     Past Medical History:  Diagnosis Date  . Atrial fibrillation ([place])   . Chronic diastolic heart failure ([place])    Echo 02/2008: EF 55%, MAC, mild MR, moderate BAE  . GERD (gastroesophageal reflux disease)   . Hx of cardiovascular stress test    Lexiscan Myoview (10/14):  Fixed inf defect, no ischemia, study not gated  . Hx of echocardiogram    Echo (11/14):  Mild LVH, EF 60-65%, mod to severe LAE, mod RVE, mild to mod reduced RVSF, mild RAE, PASP 38  . Hypertension   . Long term current use of anticoagulant   . Obesity   . Osteoarthritis   . Refusal of blood transfusions as patient is Jehovah's Witness     Past Surgical History:  Procedure Laterality Date  . ABDOMINAL HYSTERECTOMY    . CARPAL TUNNEL RELEASE    . CHOLECYSTECTOMY    . IRRIGATION AND DEBRIDEMENT SEBACEOUS CYST  1980   sebaceous cysts removed from tail bone  . KNEE ARTHROSCOPY Bilateral   . TUBAL LIGATION       Current Outpatient Medications  Medication Sig Dispense Refill  . acetaminophen (TYLENOL 8 HOUR) 650 MG CR tablet Take 1 tablet (650 mg total) by mouth every 8 (eight) hours as needed for pain. 30 tablet 3  . amLODipine-benazepril (LOTREL) 5-20 MG capsule Take  1 capsule by mouth daily. 90 capsule 2  . calcium-vitamin D (OSCAL WITH D 500-200) 500-200 MG-UNIT per tablet Take 1 tablet by mouth daily.      . Cholecalciferol (VITAMIN D-3) 1000 UNITS CAPS Take 1,000 Units by mouth daily.     . clotrimazole (LOTRIMIN) 1 % cream Apply 1 application topically 2 (two) times daily. 30 g 0  . ELIQUIS 5 MG TABS tablet Take 1 tablet by mouth twice daily. 60 tablet 6  . fish oil-omega-3 fatty acids 1000 MG capsule Take 2 g by mouth daily.    . furosemide (LASIX) 40 MG tablet Take 1.5 tablets (60 mg total) by mouth 2 (two) times  daily. 90 tablet 0  . gabapentin (NEURONTIN) 100 MG capsule Take 1 capsule in the morning at 2 capsules at night 120 capsule 2  . Multiple Vitamin (MULTIVITAMIN) tablet Take 1 tablet by mouth daily.      . nitroGLYCERIN (NITROSTAT) 0.4 MG SL tablet Place 1 tablet (0.4 mg total) under the tongue every 5 (five) minutes as needed for chest pain. 25 tablet 3  . spironolactone (ALDACTONE) 25 MG tablet 1/2 tablet (12.68m) daily 90 tablet 0   No current facility-administered medications for this visit.     Allergies:   Hydrocodone; Morphine; and Penicillins    Social History:  The patient  reports that she quit smoking about 48 years ago. Her smoking use included cigarettes. She has a 5.00 pack-year smoking history. she has never used smokeless tobacco. She reports that she does not drink alcohol or use drugs.   Family History:  The patient's family history includes Asthma in her mother; Breast cancer in her daughter; Cancer in her daughter, mother, and son; Clotting disorder in her daughter; Colon cancer in her mother; Depression in her daughter, daughter, and daughter; Diabetes in her brother, sister, and unknown relative; Hypertension in her unknown relative; Kidney cancer in her son.    ROS:  Please see the history of present illness.   Otherwise, review of systems are positive for 1 episode of chest discomfort, ankle edema worse at the end of the day.   All other systems are reviewed and negative.    PHYSICAL EXAM: VS:  BP 138/76   Pulse 87   Ht 5' 6"  (1.676 m)   Wt 226 lb (102.5 kg)   SpO2 97%   BMI 36.48 kg/m  , BMI Body mass index is 36.48 kg/m. GEN: Well nourished, well developed, in no acute distress  HEENT: normal  Neck: no JVD, carotid bruits, or masses Cardiac: irregularly irregular; no murmurs, rubs, or gallops,no edema  Respiratory:  clear to auscultation bilaterally, normal work of breathing GI: soft, nontender, nondistended, + BS MS: no deformity or atrophy  Skin: warm  and dry, no rash Neuro:  Strength and sensation are intact Psych: euthymic mood, full affect   EKG:   The ekg ordered today demonstrates AFib, rate controlled.    Recent Labs: 05/31/2016: BUN 12; Creatinine, Ser 0.70; Hemoglobin 12.4; Platelets 189; Potassium 4.5; Sodium 140   Lipid Panel    Component Value Date/Time   CHOL 143 12/21/2014 1523   TRIG 114 12/21/2014 1523   HDL 40 (L) 12/21/2014 1523   CHOLHDL 3.6 12/21/2014 1523   VLDL 23 12/21/2014 1523   LDLCALC 80 12/21/2014 1523   LDLDIRECT 119 (H) 06/14/2009 1819     Other studies Reviewed: Additional studies/ records that were reviewed today with results demonstrating: Cr 0.7 in 5/18.  Stable thoracic  aneurysm at 4.7 cm in July 2018   ASSESSMENT AND PLAN:  1. Chest discomfort: Resolved.  No further episodes.  May have been GI related.  Last stress was 2014 which was normal.  Consider ischemic eval if there is a recurrence of sx.  She has done well walking.  She will let us know if she has any exertional sx.  2. Chronic diastolic heart failure: Appears euvolemic.  Ankle edema is chronic.  3. AFib: Rate controlled.  Eliquis for stroke prevention.   4. Thoracic aortic aneurysm as above.   Current medicines are reviewed at length with the patient today.  The patient concerns regarding her medicines were addressed.  The following changes have been made:  No change  Labs/ tests ordered today include:  No orders of the defined types were placed in this encounter.   Recommend 150 minutes/week of aerobic exercise Low fat, low carb, high fiber diet recommended  Disposition:   FU in 1 year   Signed, Larae Grooms, MD  12/26/2016 11:01 AM    [facility] [address], [place], [place]  [address] Phone: [phone]; Fax: [phone]

## 2016-12-26 ENCOUNTER — Encounter: Payer: Self-pay | Admitting: Interventional Cardiology

## 2016-12-26 ENCOUNTER — Ambulatory Visit (INDEPENDENT_AMBULATORY_CARE_PROVIDER_SITE_OTHER): Payer: Medicare Other | Admitting: Interventional Cardiology

## 2016-12-26 VITALS — BP 138/76 | HR 87 | Ht 66.0 in | Wt 226.0 lb

## 2016-12-26 DIAGNOSIS — I712 Thoracic aortic aneurysm, without rupture, unspecified: Secondary | ICD-10-CM

## 2016-12-26 DIAGNOSIS — R079 Chest pain, unspecified: Secondary | ICD-10-CM | POA: Diagnosis not present

## 2016-12-26 DIAGNOSIS — I5032 Chronic diastolic (congestive) heart failure: Secondary | ICD-10-CM

## 2016-12-26 DIAGNOSIS — R0789 Other chest pain: Secondary | ICD-10-CM

## 2016-12-26 DIAGNOSIS — E785 Hyperlipidemia, unspecified: Secondary | ICD-10-CM | POA: Diagnosis not present

## 2016-12-26 LAB — LIPID PANEL
CHOLESTEROL TOTAL: 152 mg/dL (ref 100–199)
Chol/HDL Ratio: 3.1 ratio (ref 0.0–4.4)
HDL: 49 mg/dL (ref >39)
LDL CALC: 87 mg/dL (ref 0–99)
Triglycerides: 79 mg/dL (ref 0–149)
VLDL CHOLESTEROL CAL: 16 mg/dL (ref 5–40)

## 2016-12-26 LAB — COMPREHENSIVE METABOLIC PANEL
A/G RATIO: 1.7 (ref 1.2–2.2)
ALT: 11 IU/L (ref 0–32)
AST: 23 IU/L (ref 0–40)
Albumin: 4.3 g/dL (ref 3.5–4.7)
Alkaline Phosphatase: 93 IU/L (ref 39–117)
BILIRUBIN TOTAL: 0.7 mg/dL (ref 0.0–1.2)
BUN / CREAT RATIO: 25 (ref 12–28)
BUN: 18 mg/dL (ref 8–27)
CALCIUM: 9.6 mg/dL (ref 8.7–10.3)
CHLORIDE: 105 mmol/L (ref 96–106)
CO2: 27 mmol/L (ref 20–29)
Creatinine, Ser: 0.72 mg/dL (ref 0.57–1.00)
GFR, EST AFRICAN AMERICAN: 89 mL/min/{1.73_m2} (ref >59)
GFR, EST NON AFRICAN AMERICAN: 77 mL/min/{1.73_m2} (ref >59)
GLOBULIN, TOTAL: 2.5 g/dL (ref 1.5–4.5)
GLUCOSE: 111 mg/dL — AB (ref 65–99)
POTASSIUM: 4.3 mmol/L (ref 3.5–5.2)
SODIUM: 143 mmol/L (ref 134–144)
TOTAL PROTEIN: 6.8 g/dL (ref 6.0–8.5)

## 2016-12-26 MED ORDER — NITROGLYCERIN 0.4 MG SL SUBL: 0.4000 mg | SUBLINGUAL_TABLET | SUBLINGUAL | 3 refills | Status: DC | PRN

## 2016-12-26 NOTE — Patient Instructions (Signed)
Medication Instructions:  Your physician recommends that you continue on your current medications as directed. Please refer to the Current Medication list given to you today.   Labwork: TODAY: LIPIDS, CMET  Testing/Procedures: None ordered  Follow-Up: Your physician wants you to follow-up in: 6 months with Dr. Irish Lack. You will receive a reminder letter in the mail two months in advance. If you don't receive a letter, please call our office to schedule the follow-up appointment.   Any Other Special Instructions Will Be Listed Below (If Applicable).     If you need a refill on your cardiac medications before your next appointment, please call your pharmacy.

## 2017-01-18 ENCOUNTER — Other Ambulatory Visit: Payer: Self-pay | Admitting: Family Medicine

## 2017-01-30 ENCOUNTER — Other Ambulatory Visit: Payer: Self-pay | Admitting: Family Medicine

## 2017-01-30 ENCOUNTER — Ambulatory Visit: Payer: Medicare Other | Admitting: Interventional Cardiology

## 2017-01-30 DIAGNOSIS — R0602 Shortness of breath: Secondary | ICD-10-CM

## 2017-01-30 DIAGNOSIS — I10 Essential (primary) hypertension: Secondary | ICD-10-CM

## 2017-01-30 DIAGNOSIS — I4819 Other persistent atrial fibrillation: Secondary | ICD-10-CM

## 2017-01-30 DIAGNOSIS — I5032 Chronic diastolic (congestive) heart failure: Secondary | ICD-10-CM

## 2017-02-05 ENCOUNTER — Telehealth: Payer: Self-pay | Admitting: Interventional Cardiology

## 2017-02-05 NOTE — Telephone Encounter (Signed)
Patient requested a pharmacy change to [facility]... I made the change and she verbalized an understanding.

## 2017-02-05 NOTE — Telephone Encounter (Signed)
New message   Patient says that she just wants to change her pharmacy to [facility] [phone])

## 2017-02-10 ENCOUNTER — Other Ambulatory Visit: Payer: Self-pay | Admitting: *Deleted

## 2017-02-10 DIAGNOSIS — I4819 Other persistent atrial fibrillation: Secondary | ICD-10-CM

## 2017-02-10 DIAGNOSIS — R0602 Shortness of breath: Secondary | ICD-10-CM

## 2017-02-10 DIAGNOSIS — I10 Essential (primary) hypertension: Secondary | ICD-10-CM

## 2017-02-10 DIAGNOSIS — I5032 Chronic diastolic (congestive) heart failure: Secondary | ICD-10-CM

## 2017-02-10 MED ORDER — SPIRONOLACTONE 25 MG PO TABS: ORAL_TABLET | ORAL | 0 refills | Status: DC

## 2017-02-10 MED ORDER — FUROSEMIDE 40 MG PO TABS: 60.0000 mg | ORAL_TABLET | Freq: Two times a day (BID) | ORAL | 0 refills | Status: DC

## 2017-02-10 MED ORDER — AMLODIPINE BESY-BENAZEPRIL HCL 5-20 MG PO CAPS: 1.0000 | ORAL_CAPSULE | Freq: Every day | ORAL | 2 refills | Status: DC

## 2017-02-10 NOTE — Telephone Encounter (Signed)
New request to [facility]. Fleeger, Salome Spotted, CMA

## 2017-02-21 ENCOUNTER — Encounter: Payer: Self-pay | Admitting: Family Medicine

## 2017-02-21 ENCOUNTER — Other Ambulatory Visit: Payer: Self-pay

## 2017-02-21 ENCOUNTER — Ambulatory Visit (INDEPENDENT_AMBULATORY_CARE_PROVIDER_SITE_OTHER): Payer: Medicare Other | Admitting: Family Medicine

## 2017-02-21 VITALS — BP 138/72 | HR 66 | Temp 97.7°F | Ht 66.0 in | Wt 227.4 lb

## 2017-02-21 DIAGNOSIS — Z1321 Encounter for screening for nutritional disorder: Secondary | ICD-10-CM | POA: Diagnosis not present

## 2017-02-21 DIAGNOSIS — Z87898 Personal history of other specified conditions: Secondary | ICD-10-CM | POA: Diagnosis not present

## 2017-02-21 DIAGNOSIS — R0789 Other chest pain: Secondary | ICD-10-CM | POA: Diagnosis not present

## 2017-02-21 DIAGNOSIS — R21 Rash and other nonspecific skin eruption: Secondary | ICD-10-CM | POA: Diagnosis not present

## 2017-02-21 DIAGNOSIS — E559 Vitamin D deficiency, unspecified: Secondary | ICD-10-CM

## 2017-02-21 DIAGNOSIS — M1990 Unspecified osteoarthritis, unspecified site: Secondary | ICD-10-CM

## 2017-02-21 DIAGNOSIS — R739 Hyperglycemia, unspecified: Secondary | ICD-10-CM | POA: Diagnosis not present

## 2017-02-21 MED ORDER — NYSTATIN 100000 UNIT/GM EX POWD: Freq: Four times a day (QID) | CUTANEOUS | 0 refills | Status: DC

## 2017-02-21 MED ORDER — ACETAMINOPHEN ER 650 MG PO TBCR: 650.0000 mg | EXTENDED_RELEASE_TABLET | Freq: Three times a day (TID) | ORAL | 3 refills | Status: DC | PRN

## 2017-02-21 MED ORDER — CLOTRIMAZOLE 1 % EX CREA: 1.0000 "application " | TOPICAL_CREAM | Freq: Two times a day (BID) | CUTANEOUS | 0 refills | Status: DC

## 2017-02-21 NOTE — Patient Instructions (Signed)
Thank you for coming in today, it was so nice to see you! Today we talked about:    Left sided rib pain: I think you likely have some irritation. You can try Tylenol as needed, try going without a bra when you're at home, get a new bra without any tightness around the chest. Try heating pad as needed and gentle side stretching  Rash: Try the nystatin powder first for the next couple days, if that doesn't help you can go back to the lotrimin cream  Please follow up in 2 weeks if you're still having pain on the left side of your rib.   Bring in all your medications or supplements to each appointment for review.   If we ordered any tests today, you will be notified via telephone of any abnormalities. If everything is normal you will get a letter in the mail.   If you have any questions or concerns, please do not hesitate to call the office at [phone]. You can also message me directly via MyChart.   Sincerely,  Smitty Cords, MD

## 2017-02-21 NOTE — Progress Notes (Signed)
Subjective:    Patient ID: Bianca [name] , female   DOB: 27-Aug-1930 , 84 y.o..   MRN: [id number]  HPI  Bianca [name] is a 84 year old female with past medical history of hypertension, A. fib, diastolic heart failure, thoracic aortic aneurysm here for   1. Left sided rib pain: Patient states over the last month she has had some rib pain under her left breast.  She notes that it feels more like a "funny feeling" rather than pain.  She feels it mostly when she takes a deep breath in and when she lays on her left side.  She notes that the pain improves when she takes her bra off and when she takes some Tylenol.  The pain is constant all day long.  Denies any trauma to the area.  2. Rash: Patient notes that she has had this rash for "years".  She has been prescribed nystatin and Lotrimin in the past but it does not ever take away the rash.  The rash is more of just itchiness and not necessarily redness.  It is hard for her to see her skin because she has a stomach that overlaps the area that is itchy.  She is itchy from her pubic area all the way to the top of her genital area.  She has been out of Lotrimin cream for the last week and the itching has returned.   Review of Systems: Per HPI.   Past Medical History: Patient Active Problem List   Diagnosis Date Noted  . Left-sided chest wall pain 02/23/2017  . Thoracic aortic aneurysm ([place]) 06/05/2016  . Hypertensive heart disease 06/05/2016  . Lower back pain 12/12/2015  . Neck pain on left side 06/28/2015  . Sprain of ankle 06/21/2015  . Rash 05/03/2015  . Bilateral leg paresthesia 12/13/2014  . At high risk for falls 05/11/2012  . Knee pain, bilateral 04/30/2012  . Long term (current) use of anticoagulants 08/15/2010  . Bilateral leg edema 06/06/2010  . ARTHRITIS, SHOULDERS, BILATERAL 09/08/2008  . Osteoarthritis of both knees 10/28/2007  . Obesity 06/04/2007  . DEPRESSIVE DISORDER 06/04/2007  . BENIGN NEOPLASM OF COLON 04/06/2007  .  Atrial fibrillation ([place]) 12/08/2006  . Chronic diastolic heart failure ([place]) 12/08/2006  . HYPERTENSION, BENIGN ESSENTIAL 04/23/2006    Medications: reviewed and updated Current Outpatient Medications  Medication Sig Dispense Refill  . acetaminophen (TYLENOL 8 HOUR) 650 MG CR tablet Take 1 tablet (650 mg total) by mouth every 8 (eight) hours as needed for pain. 30 tablet 3  . amLODipine-benazepril (LOTREL) 5-20 MG capsule Take 1 capsule by mouth daily. 90 capsule 2  . calcium-vitamin D (OSCAL WITH D 500-200) 500-200 MG-UNIT per tablet Take 1 tablet by mouth daily.      . Cholecalciferol (VITAMIN D-3) 1000 UNITS CAPS Take 1,000 Units by mouth daily.     . clotrimazole (LOTRIMIN) 1 % cream Apply 1 application topically 2 (two) times daily. 30 g 0  . ELIQUIS 5 MG TABS tablet Take 1 tablet by mouth twice daily. 60 tablet 6  . fish oil-omega-3 fatty acids 1000 MG capsule Take 2 g by mouth daily.    . furosemide (LASIX) 40 MG tablet Take 1.5 tablets (60 mg total) by mouth 2 (two) times daily. 90 tablet 0  . gabapentin (NEURONTIN) 100 MG capsule Take 1 capsule in the morning at 2 capsules at night 120 capsule 2  . Multiple Vitamin (MULTIVITAMIN) tablet Take 1 tablet by mouth daily.      Marland Kitchen  nitroGLYCERIN (NITROSTAT) 0.4 MG SL tablet Place 1 tablet (0.4 mg total) under the tongue every 5 (five) minutes as needed for chest pain. 25 tablet 3  . nystatin (MYCOSTATIN/NYSTOP) powder Apply topically 4 (four) times daily. 15 g 0  . spironolactone (ALDACTONE) 25 MG tablet 1/2 tablet (12.5mg ) daily 90 tablet 0   No current facility-administered medications for this visit.     Social Hx:  reports that she quit smoking about 48 years ago. Her smoking use included cigarettes. She has a 5.00 pack-year smoking history. she has never used smokeless tobacco.   Objective:   BP 138/72   Pulse 66   Temp 97.7 F (36.5 C) (Oral)   Ht 5\' 6"  (1.676 m)   Wt 227 lb 6.4 oz (103.1 kg)   SpO2 99%   BMI 36.70 kg/m    Physical Exam  Gen: NAD, alert, cooperative with exam, well-appearing Cardiac: Regular rate and rhythm, normal S1/S2, no edema, capillary refill brisk  Respiratory: Clear to auscultation bilaterally, no wheezes, non-labored breathing Skin: no rashes, normal turgor  Physical Exam  Pulmonary/Chest:       Assessment & Plan:  Rash Patient endorses pruritus and rash under pannus.  No distinct rash seen on exam today however this is been a chronic issue for her.  Has used Lotrimin cream in the past which resolved her rash.  Suspect that the location of the rash is probably more indicative of yeast. -Discussed using nystatin powder for 5 days and if no relief can try Lotrimin cream again  Left-sided chest wall pain Pain palpated on left anterior axillary line on rib right under left breast.  Suspect that this pain is secondary to wearing her bra is digging into her skin.  No history of trauma. -Discussed using Tylenol as needed for pain -Discussed changing bras to something more comfortable and not wearing a bra as much as possible -We will follow-up in 2-4 weeks if no improvement -She is up-to-date with her mammogram, last mammogram was normal  Orders Placed This Encounter  Procedures  . Hemoglobin A1c  . VITAMIN D 25 Hydroxy (Vit-D Deficiency, Fractures)   Meds ordered this encounter  Medications  . clotrimazole (LOTRIMIN) 1 % cream    Sig: Apply 1 application topically 2 (two) times daily.    Dispense:  30 g    Refill:  0  . nystatin (MYCOSTATIN/NYSTOP) powder    Sig: Apply topically 4 (four) times daily.    Dispense:  15 g    Refill:  0  . acetaminophen (TYLENOL 8 HOUR) 650 MG CR tablet    Sig: Take 1 tablet (650 mg total) by mouth every 8 (eight) hours as needed for pain.    Dispense:  30 tablet    Refill:  3    Smitty Cords, MD [place], PGY-3

## 2017-02-22 LAB — HEMOGLOBIN A1C
Est. average glucose Bld gHb Est-mCnc: 114 mg/dL
Hgb A1c MFr Bld: 5.6 % (ref 4.8–5.6)

## 2017-02-22 LAB — VITAMIN D 25 HYDROXY (VIT D DEFICIENCY, FRACTURES): VIT D 25 HYDROXY: 42.7 ng/mL (ref 30.0–100.0)

## 2017-02-22 IMAGING — MR MR MRA CHEST W/ OR W/O CM
15 series · 16 of 16 positions shown · IV contrast (Multihance 20 ML)
Comparison: Report from echocardiography dated 01/05/2016

CLINICAL DATA: Dilatation of the ascending thoracic aorta by
echocardiography.

EXAM:
MRA CHEST WITH OR WITHOUT CONTRAST
TECHNIQUE: Angiographic images of the chest were obtained using MRA technique
without and with intravenous contrast.
CONTRAST:  20mL MULTIHANCE GADOBENATE DIMEGLUMINE 529 MG/ML IV SOLN

[Series 2: bSSFP · axial · 6.0mm · 1.48mm/px · 1 of 45 slices shown (1 of 2)]
[im 1/45]
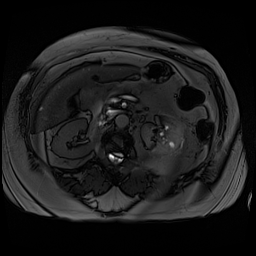

[Series 3: axial haste · axial · 5.0mm · 0.74mm/px · 1 of 45 slices shown]
[im 1/45]
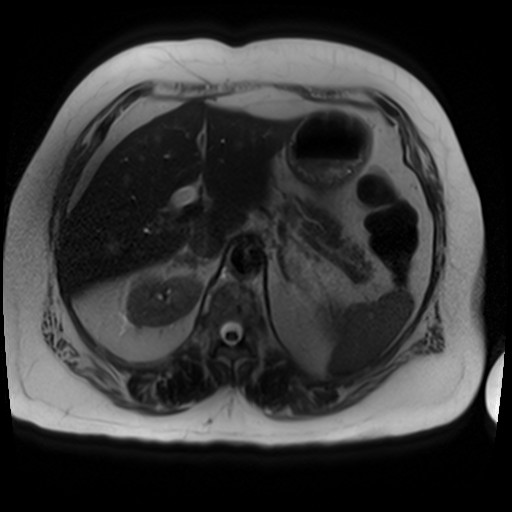

[Series 4: T1 fat-sat · axial · 5.0mm · 1.48mm/px · 1 of 38 slices shown]
[im 1/38]
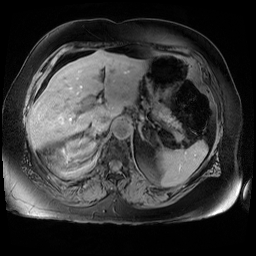

[Series 5: T1 dynamic · axial · non-contrast · 2.5mm · 1.56mm/px · 1 of 88 slices shown]
[im 1/88]
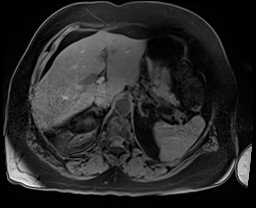

[Series 6: bSSFP · sagittal · 4.0mm · 1.56mm/px · 1 of 33 slices shown (2 of 2)]
[im 1/33]
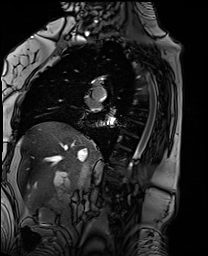

[Series 9: cor flash pre · sagittal · non-contrast · 1.5mm · 1.04mm/px · 1 of 88 slices shown]
[im 1/88]
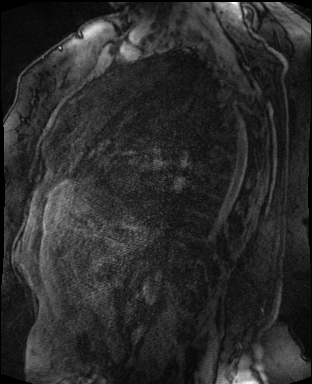

[Series 11: cor flash post · sagittal · 1.5mm · 1.04mm/px · 1 of 88 slices shown (1 of 2)]
[im 1/88]
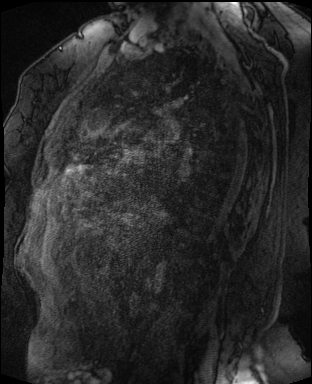

[Series 12: cor flash post_sub · sagittal · 1.5mm · 1.04mm/px · 1 of 88 slices shown (1 of 2)]
[im 1/88]
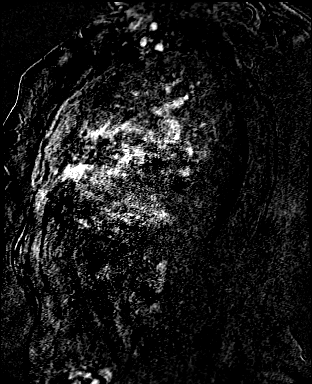

[Series 14: T1 dynamic post-contrast · axial · 2.5mm · 1.56mm/px · 1 of 88 slices shown (1 of 2)]
[im 1/88]
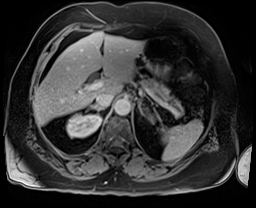

[Series 15: T1 dynamic post-contrast · axial · 2.5mm · 1.56mm/px · 1 of 88 slices shown (2 of 2)]
[im 1/88]
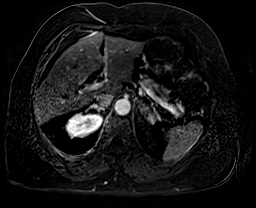

[Series 16: T1 fat-sat post-contrast · axial · 5.0mm · 1.48mm/px · 1 of 38 slices shown]
[im 1/38]
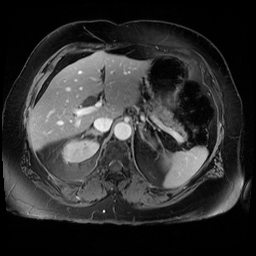

[Series 17: cor flash post · sagittal · 1.5mm · 1.04mm/px · 1 of 88 slices shown (2 of 2)]
[im 1/88]
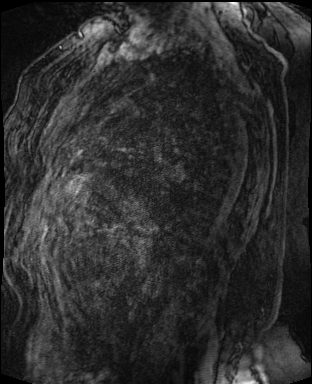

[Series 18: cor flash post_sub · sagittal · 1.5mm · 1.04mm/px · 2 of 88 slices shown (2 of 2)]
[im 1/88]
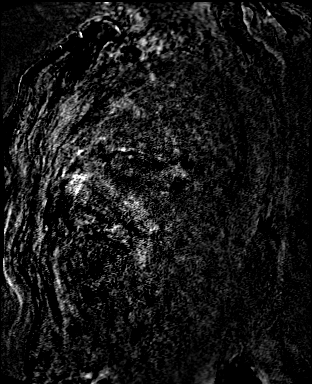
[im 88/88]
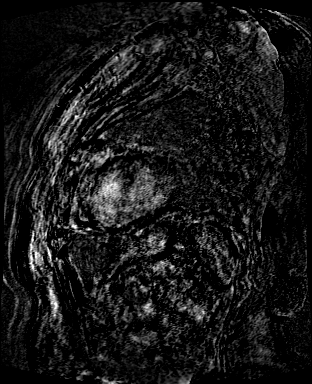

[Series 100: MRA · oblique · 1.04mm/px · 1 of 11 slices shown]
[im 1/11]
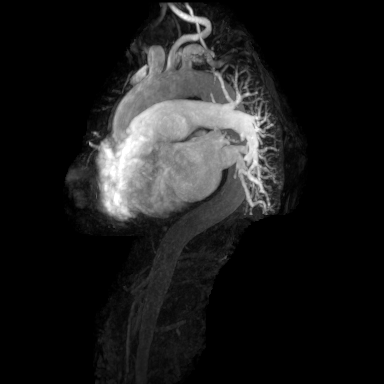

[Series 101: <mip range> · sagittal · 1.04mm/px · 1 of 18 slices shown]
[im 1/18]
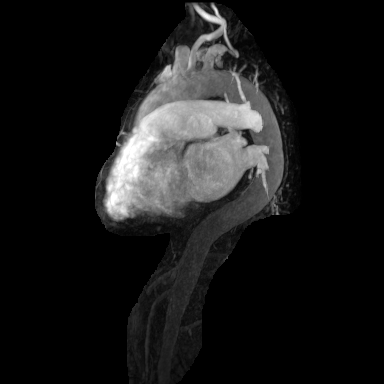

[16 of 16 positions shown; findings below may reference images not displayed]

FINDINGS: VASCULAR

Aorta: The ascending thoracic aorta shows dilatation and measures up
to 4.7 cm in maximum diameter. The level of the aortic root and
sinuses of Valsalva it does not appear to be dilated. The proximal
arch measures 3.7 cm. The distal arch measures 3.3 cm. The
descending thoracic aorta measures 3.0 cm. Proximal great vessels
show normal branching anatomy and normal patency.

Heart: The heart is mildly enlarged. No pericardial fluid
identified.

Pulmonary Arteries: Central pulmonary arteries are of normal
caliber.

NON-VASCULAR

No significant nonvascular abnormalities identified. No evidence of
incidental masses or lymphadenopathy. No pleural effusions
identified. Visualized bony structures are unremarkable. Mild
prominence of visualized intrahepatic bile ducts is likely on the
basis of prior cholecystectomy.
IMPRESSION: Aneurysmal dilatation of the ascending thoracic aorta measuring up
to 4.7 cm in maximal diameter. The aortic root/sinuses of Valsalva a
do not show aneurysmal dilatation.

Ascending thoracic aortic aneurysm. Recommend semi-annual imaging
followup by CTA or MRA and referral to cardiothoracic surgery if not
already obtained. This recommendation follows 5525
ACCF/AHA/AATS/ACR/ASA/SCA/HEYKEL/ELNAZ/CAROLLE/BEBLER Guidelines for the
Diagnosis and Management of Patients With Thoracic Aortic Disease.
Circulation. 5525; 121: [id number]

## 2017-02-23 DIAGNOSIS — R0789 Other chest pain: Secondary | ICD-10-CM | POA: Insufficient documentation

## 2017-02-23 NOTE — Assessment & Plan Note (Signed)
Pain palpated on left anterior axillary line on rib right under left breast.  Suspect that this pain is secondary to wearing her bra is digging into her skin.  No history of trauma. -Discussed using Tylenol as needed for pain -Discussed changing bras to something more comfortable and not wearing a bra as much as possible -We will follow-up in 2-4 weeks if no improvement -She is up-to-date with her mammogram, last mammogram was normal

## 2017-02-23 NOTE — Assessment & Plan Note (Signed)
Patient endorses pruritus and rash under pannus.  No distinct rash seen on exam today however this is been a chronic issue for her.  Has used Lotrimin cream in the past which resolved her rash.  Suspect that the location of the rash is probably more indicative of yeast. -Discussed using nystatin powder for 5 days and if no relief can try Lotrimin cream again

## 2017-02-24 ENCOUNTER — Encounter: Payer: Self-pay | Admitting: Family Medicine

## 2017-03-14 DIAGNOSIS — H26491 Other secondary cataract, right eye: Secondary | ICD-10-CM | POA: Diagnosis not present

## 2017-03-14 DIAGNOSIS — H353131 Nonexudative age-related macular degeneration, bilateral, early dry stage: Secondary | ICD-10-CM | POA: Diagnosis not present

## 2017-03-14 DIAGNOSIS — H40013 Open angle with borderline findings, low risk, bilateral: Secondary | ICD-10-CM | POA: Diagnosis not present

## 2017-03-14 DIAGNOSIS — Z961 Presence of intraocular lens: Secondary | ICD-10-CM | POA: Diagnosis not present

## 2017-03-14 LAB — HM DIABETES EYE EXAM

## 2017-03-21 ENCOUNTER — Encounter: Payer: Self-pay | Admitting: Family Medicine

## 2017-04-28 ENCOUNTER — Other Ambulatory Visit: Payer: Self-pay | Admitting: Family Medicine

## 2017-04-28 DIAGNOSIS — Z1231 Encounter for screening mammogram for malignant neoplasm of breast: Secondary | ICD-10-CM

## 2017-04-30 NOTE — Progress Notes (Signed)
   Subjective:    Patient ID: Bianca [name] , female   DOB: Oct 05, 1930 , 84 y.o..   MRN: [id number]  HPI  Bianca [name] is here for  Chief Complaint  Patient presents with  . knee injections   Bilateral knee pain; patient notes that she is having continued bilateral knee pain.  She has a history of osteoarthritis.  She notes that she received steroid injections in the past and was provided great relief for her.  Her last injections were in November 2018.  She has had no weakness but needs to admit with a cane secondary to pain.  Social Hx:  reports that she quit smoking about 48 years ago. Her smoking use included cigarettes. She has a 5.00 pack-year smoking history. She has never used smokeless tobacco.   Objective:   BP 124/68   Pulse 90   Temp 98.3 F (36.8 C) (Oral)   Ht 5\' 6"  (1.676 m)   Wt 224 lb 12.8 oz (102 kg)   SpO2 98%   BMI 36.28 kg/m  Physical Exam  Gen: NAD, alert, cooperative with exam, well-appearing Bilateral Knees: No erythema. There is swelling noted in the medial joint compartment.  Palpation with positive joint line tenderness in medial and lateral sides. Left knee with palpable baker's cyst on posterior aspect. No patellar tenderness, or condyle tenderness. ROM full in flexion and extension and lower leg rotation. Ligaments with solid consistent endpoints including ACL, PCL, LCL, MCL. Patellar glide with crepitus. Patellar and quadriceps tendons unremarkable. Hamstring and quadriceps strength is normal.  Neurovascularly intact   Psych: good insight, normal mood and affect  Procedure Note:   INJECTIONRight Knee: Patient was given informed consent, signed copy in the chart. Appropriate time out was taken. Area prepped and draped in usual sterile fashion.0.5cc of methylprednisolone 80mg /ml plus 4.5cc of 1% lidocaine without epinephrine was injected into the Right Kneeusing a lateralapproach. The patient tolerated the procedure well. There were no  complications. Post procedure instructions were given.  INJECTIONLeft Knee: Patient was given informed consent, signed copy in the chart. Appropriate time out was taken. Area prepped and draped in usual sterile fashion.0.5cc of methylprednisolone 80mg /ml plus 4.5cc of 1% lidocaine without epinephrine was injected into the Left Kneeusing a medialapproach. The patient tolerated the procedure well. There were no complications. Post procedure instructions were given.  Assessment & Plan:  Osteoarthritis of both knees Bilateral knee injections performed today, no complications.  Patient experienced immediate relief -Continue walking exercises - Continue Tylenol as needed for pain -Can follow-up again in 3 months for repeat steroid injections   Meds ordered this encounter  Medications  . methylPREDNISolone acetate (DEPO-MEDROL) injection 40 mg  . methylPREDNISolone acetate (DEPO-MEDROL) injection 40 mg    Smitty Cords, MD [place], PGY-3

## 2017-05-01 ENCOUNTER — Encounter: Payer: Self-pay | Admitting: Family Medicine

## 2017-05-01 ENCOUNTER — Ambulatory Visit (INDEPENDENT_AMBULATORY_CARE_PROVIDER_SITE_OTHER): Payer: Medicare Other | Admitting: Family Medicine

## 2017-05-01 ENCOUNTER — Other Ambulatory Visit: Payer: Self-pay

## 2017-05-01 VITALS — BP 124/68 | HR 90 | Temp 98.3°F | Ht 66.0 in | Wt 224.8 lb

## 2017-05-01 DIAGNOSIS — M170 Bilateral primary osteoarthritis of knee: Secondary | ICD-10-CM | POA: Diagnosis not present

## 2017-05-01 MED ORDER — METHYLPREDNISOLONE ACETATE 40 MG/ML IJ SUSP
40.0000 mg | Freq: Once | INTRAMUSCULAR | Status: AC
  Administered 2017-05-01: 40 mg via INTRAMUSCULAR

## 2017-05-05 NOTE — Assessment & Plan Note (Signed)
Bilateral knee injections performed today, no complications.  Patient experienced immediate relief -Continue walking exercises - Continue Tylenol as needed for pain -Can follow-up again in 3 months for repeat steroid injections

## 2017-05-06 ENCOUNTER — Other Ambulatory Visit: Payer: Self-pay

## 2017-05-07 ENCOUNTER — Other Ambulatory Visit: Payer: Self-pay | Admitting: *Deleted

## 2017-05-07 DIAGNOSIS — R0602 Shortness of breath: Secondary | ICD-10-CM

## 2017-05-07 DIAGNOSIS — I10 Essential (primary) hypertension: Secondary | ICD-10-CM

## 2017-05-07 DIAGNOSIS — I4819 Other persistent atrial fibrillation: Secondary | ICD-10-CM

## 2017-05-07 DIAGNOSIS — I5032 Chronic diastolic (congestive) heart failure: Secondary | ICD-10-CM

## 2017-05-07 MED ORDER — APIXABAN 5 MG PO TABS: 5.0000 mg | ORAL_TABLET | Freq: Two times a day (BID) | ORAL | 7 refills | Status: DC

## 2017-05-07 MED ORDER — CLOTRIMAZOLE 1 % EX CREA: 1.0000 "application " | TOPICAL_CREAM | Freq: Two times a day (BID) | CUTANEOUS | 0 refills | Status: DC

## 2017-05-07 NOTE — Telephone Encounter (Signed)
Eliquis 5mg  refill request received; pt is 84 years old, Wt-102kg, Crea-0.72 on 12/26/16, last seen by Dr. Irish Lack on 12/26/16; will send in refill to requested pharmacy.

## 2017-05-09 ENCOUNTER — Other Ambulatory Visit: Payer: Self-pay

## 2017-05-09 DIAGNOSIS — I4819 Other persistent atrial fibrillation: Secondary | ICD-10-CM

## 2017-05-09 DIAGNOSIS — I10 Essential (primary) hypertension: Secondary | ICD-10-CM

## 2017-05-09 DIAGNOSIS — I5032 Chronic diastolic (congestive) heart failure: Secondary | ICD-10-CM

## 2017-05-09 DIAGNOSIS — R0602 Shortness of breath: Secondary | ICD-10-CM

## 2017-05-09 MED ORDER — APIXABAN 5 MG PO TABS: 5.0000 mg | ORAL_TABLET | Freq: Two times a day (BID) | ORAL | 7 refills | Status: DC

## 2017-05-09 MED ORDER — SPIRONOLACTONE 25 MG PO TABS: ORAL_TABLET | ORAL | 0 refills | Status: DC

## 2017-06-10 ENCOUNTER — Ambulatory Visit
Admission: RE | Admit: 2017-06-10 | Discharge: 2017-06-10 | Disposition: A | Discharge status: Home / Self Care | Payer: Medicare Other | Source: Ambulatory Visit | Attending: Family Medicine | Admitting: Family Medicine

## 2017-06-10 DIAGNOSIS — Z1231 Encounter for screening mammogram for malignant neoplasm of breast: Secondary | ICD-10-CM

## 2017-06-17 NOTE — Progress Notes (Signed)
Cardiology Office Note   Date:  06/18/2017   ID:  Bianca [name], DOB Feb 15, 1930, MRN [id number]  PCP:  Carlyle Dolly, MD    No chief complaint on file.  AFib  Wt Readings from Last 3 Encounters:  06/18/17 227 lb (103 kg)  05/01/17 224 lb 12.8 oz (102 kg)  02/21/17 227 lb 6.4 oz (103.1 kg)       History of Present Illness: Bianca [name] is a 84 y.o. female  Permanent atrial fibrillation and chronic diastolic heart failure. Echo in 12/17 showed:  Left ventricle: The cavity size was normal. There was mild concentric hypertrophy. Systolic function was normal. Wall motion was normal; there were no regional wall motion abnormalities. - Aortic valve: There was no regurgitation. - Ascending aorta: The ascending aorta was moderately dilated measuring 47 mm. - Mitral valve: Mildly thickened leaflets . There was mild regurgitation. - Left atrium: The atrium was severely dilated. - Right ventricle: The cavity size was mildly dilated. Wall thickness was normal. Systolic function was mildly reduced. - Right atrium: The atrium was moderately dilated. - Tricuspid valve: There was mild regurgitation. - Pulmonic valve: There was mild regurgitation. - Pulmonary arteries: Systolic pressure was mildly increased. PA peak pressure: 38 mm Hg (S). - Inferior vena cava: The vessel was normal in size.  MRA in 1/18 showed:Aneurysmal dilatation of the ascending thoracic aorta measuring up to 4.7 cm in maximal diameter. The aortic root/sinuses of Valsalva  do not show aneurysmal dilatation.  She has had leg swelling in the past.  She had been taking daily Lasix, although it was prescribed twice a day.  In 12/18, she had some chest discomfort, started epigastric and went up into her chest.  She had drank some soda and that helped.  She ate some sausages that she would not normally eat , that evening.  No further since that time.   Since the last visit, no further chest  discomfort.  Breathing feels ok.  SHe has  aconvention coming up in [facility] that will require a lot of walking so she wants to be checked out.    She has had an itchy rash on her neck.  Using hydrocortisone. No new meds started.   Home readings are typically in the 120s.  She just took her meds before coming here.  Well controlled at other MDs offices.   Past Medical History:  Diagnosis Date  . Atrial fibrillation ([place])   . Chronic diastolic heart failure ([place])    Echo 02/2008: EF 55%, MAC, mild MR, moderate BAE  . GERD (gastroesophageal reflux disease)   . Hx of cardiovascular stress test    Lexiscan Myoview (10/14):  Fixed inf defect, no ischemia, study not gated  . Hx of echocardiogram    Echo (11/14):  Mild LVH, EF 60-65%, mod to severe LAE, mod RVE, mild to mod reduced RVSF, mild RAE, PASP 38  . Hypertension   . Long term current use of anticoagulant   . Obesity   . Osteoarthritis   . Refusal of blood transfusions as patient is Jehovah's Witness     Past Surgical History:  Procedure Laterality Date  . ABDOMINAL HYSTERECTOMY    . CARPAL TUNNEL RELEASE    . CHOLECYSTECTOMY    . IRRIGATION AND DEBRIDEMENT SEBACEOUS CYST  1980   sebaceous cysts removed from tail bone  . KNEE ARTHROSCOPY Bilateral   . TUBAL LIGATION       Current Outpatient Medications  Medication Sig Dispense Refill  . acetaminophen (TYLENOL 8 HOUR) 650 MG CR tablet Take 1 tablet (650 mg total) by mouth every 8 (eight) hours as needed for pain. 30 tablet 3  . amLODipine-benazepril (LOTREL) 5-20 MG capsule Take 1 capsule by mouth daily. 90 capsule 2  . apixaban (ELIQUIS) 5 MG TABS tablet Take 1 tablet (5 mg total) by mouth 2 (two) times daily. 60 tablet 7  . calcium-vitamin D (OSCAL WITH D 500-200) 500-200 MG-UNIT per tablet Take 1 tablet by mouth daily.      . Cholecalciferol (VITAMIN D-3) 1000 UNITS CAPS Take 1,000 Units by mouth daily.     . clotrimazole (LOTRIMIN) 1 % cream Apply 1 application  topically 2 (two) times daily. 30 g 0  . fish oil-omega-3 fatty acids 1000 MG capsule Take 2 g by mouth daily.    . furosemide (LASIX) 40 MG tablet Take 60 mg by mouth daily.    . Multiple Vitamin (MULTIVITAMIN) tablet Take 1 tablet by mouth daily.      . nitroGLYCERIN (NITROSTAT) 0.4 MG SL tablet Place 1 tablet (0.4 mg total) under the tongue every 5 (five) minutes as needed for chest pain. 25 tablet 3  . spironolactone (ALDACTONE) 25 MG tablet 1/2 tablet (12.33m) daily 90 tablet 0   No current facility-administered medications for this visit.     Allergies:   Hydrocodone; Morphine; and Penicillins    Social History:  The patient  reports that she quit smoking about 48 years ago. Her smoking use included cigarettes. She has a 5.00 pack-year smoking history. She has never used smokeless tobacco. She reports that she does not drink alcohol or use drugs.   Family History:  The patient's family history includes Asthma in her mother; Breast cancer in her maternal grandmother; Breast cancer (age of onset: 455 in her daughter; Cancer in her daughter, mother, and son; Clotting disorder in her daughter; Colon cancer in her mother; Depression in her daughter, daughter, and daughter; Diabetes in her brother, sister, and unknown relative; Hypertension in her unknown relative; Kidney cancer in her son.    ROS:  Please see the history of present illness.   Otherwise, review of systems are positive for knee pain.   All other systems are reviewed and negative.    PHYSICAL EXAM: VS:  BP (!) 160/94   Pulse 83   Ht 5' 6"  (1.676 m)   Wt 227 lb (103 kg)   SpO2 92%   BMI 36.64 kg/m  , BMI Body mass index is 36.64 kg/m. GEN: Well nourished, well developed, in no acute distress  HEENT: normal  Neck: no JVD, carotid bruits, or masses Cardiac: RRR; no murmurs, rubs, or gallops,no edema  Respiratory:  clear to auscultation bilaterally, normal work of breathing GI: soft, nontender, nondistended, + BS MS: no  deformity or atrophy  Skin: warm and dry, no rash Neuro:  Strength and sensation are intact Psych: euthymic mood, full affect     Recent Labs: 12/26/2016: ALT 11; BUN 18; Creatinine, Ser 0.72; Potassium 4.3; Sodium 143   Lipid Panel    Component Value Date/Time   CHOL 152 12/26/2016 1125   TRIG 79 12/26/2016 1125   HDL 49 12/26/2016 1125   CHOLHDL 3.1 12/26/2016 1125   CHOLHDL 3.6 12/21/2014 1523   VLDL 23 12/21/2014 1523   LDLCALC 87 12/26/2016 1125   LDLDIRECT 119 (H) 06/14/2009 1819     Other studies Reviewed: Additional studies/ records that were reviewed today with results  demonstrating: LDL 87 in 12/18.   ASSESSMENT AND PLAN:  1. Chronic diastolic heart failure:  Appears euvolemic.  Hypertensive heart disease controlled at home.  Repeat check today showed 138/80.  Continue to monitor at home.  Low salt diet. Try to find an exercise that does not other her knees.  2. AFib: Eliquis for stroke prevention.  No bleeding problems.  Rate controlled.  3. Thoracic aneurysm: 4.7 cm in 2018.  Plan for repeat MRI in July 2019 to reevaluate. 4. Anticoagulated: No bleeding issues.  Continue Eliquis.     Current medicines are reviewed at length with the patient today.  The patient concerns regarding her medicines were addressed.  The following changes have been made:  No change  Labs/ tests ordered today include:  No orders of the defined types were placed in this encounter.   Recommend 150 minutes/week of aerobic exercise Low fat, low carb, high fiber diet recommended  Disposition:   FU in 6 months   Signed, Larae Grooms, MD  06/18/2017 10:03 AM    [facility] [address], [place], [place]  [address] Phone: [phone]; Fax: [phone]

## 2017-06-18 ENCOUNTER — Encounter: Payer: Self-pay | Admitting: Interventional Cardiology

## 2017-06-18 ENCOUNTER — Ambulatory Visit (INDEPENDENT_AMBULATORY_CARE_PROVIDER_SITE_OTHER): Payer: Medicare Other | Admitting: Interventional Cardiology

## 2017-06-18 VITALS — BP 160/94 | HR 83 | Ht 66.0 in | Wt 227.0 lb

## 2017-06-18 DIAGNOSIS — I4819 Other persistent atrial fibrillation: Secondary | ICD-10-CM

## 2017-06-18 DIAGNOSIS — I5032 Chronic diastolic (congestive) heart failure: Secondary | ICD-10-CM | POA: Diagnosis not present

## 2017-06-18 DIAGNOSIS — I481 Persistent atrial fibrillation: Secondary | ICD-10-CM | POA: Diagnosis not present

## 2017-06-18 DIAGNOSIS — I712 Thoracic aortic aneurysm, without rupture, unspecified: Secondary | ICD-10-CM

## 2017-06-18 DIAGNOSIS — R0789 Other chest pain: Secondary | ICD-10-CM

## 2017-06-18 LAB — CBC
HEMATOCRIT: 39.5 % (ref 34.0–46.6)
HEMOGLOBIN: 12.5 g/dL (ref 11.1–15.9)
MCH: 28.8 pg (ref 26.6–33.0)
MCHC: 31.6 g/dL (ref 31.5–35.7)
MCV: 91 fL (ref 79–97)
Platelets: 199 10*3/uL (ref 150–450)
RBC: 4.34 x10E6/uL (ref 3.77–5.28)
RDW: 14.7 % (ref 12.3–15.4)
WBC: 6.6 10*3/uL (ref 3.4–10.8)

## 2017-06-18 LAB — BASIC METABOLIC PANEL
BUN / CREAT RATIO: 28 (ref 12–28)
BUN: 17 mg/dL (ref 8–27)
CO2: 27 mmol/L (ref 20–29)
Calcium: 9.7 mg/dL (ref 8.7–10.3)
Chloride: 103 mmol/L (ref 96–106)
Creatinine, Ser: 0.6 mg/dL (ref 0.57–1.00)
GFR calc Af Amer: 96 mL/min/{1.73_m2} (ref >59)
GFR, EST NON AFRICAN AMERICAN: 83 mL/min/{1.73_m2} (ref >59)
GLUCOSE: 111 mg/dL — AB (ref 65–99)
Potassium: 4.5 mmol/L (ref 3.5–5.2)
Sodium: 141 mmol/L (ref 134–144)

## 2017-06-18 MED ORDER — NITROGLYCERIN 0.4 MG SL SUBL: 0.4000 mg | SUBLINGUAL_TABLET | SUBLINGUAL | 3 refills | Status: DC | PRN

## 2017-06-18 NOTE — Patient Instructions (Signed)
Medication Instructions:  Your physician recommends that you continue on your current medications as directed. Please refer to the Current Medication list given to you today.   Labwork: TODAY: CBC, BMET  Testing/Procedures: Your physician has requested that you have a MRI of your chest to evaluate you thoracic aortic aneurysm in July.   Follow-Up: Your physician wants you to follow-up in: 6 months with Dr. Irish Lack. You will receive a reminder letter in the mail two months in advance. If you don't receive a letter, please call our office to schedule the follow-up appointment.   Any Other Special Instructions Will Be Listed Below (If Applicable).     If you need a refill on your cardiac medications before your next appointment, please call your pharmacy.

## 2017-06-27 ENCOUNTER — Encounter

## 2017-07-07 ENCOUNTER — Ambulatory Visit: Payer: Medicare Other | Admitting: Interventional Cardiology

## 2017-07-30 ENCOUNTER — Ambulatory Visit (HOSPITAL_COMMUNITY)
Admission: RE | Admit: 2017-07-30 | Discharge: 2017-07-30 | Disposition: A | Discharge status: Home / Self Care | Payer: Medicare Other | Source: Ambulatory Visit | Attending: Interventional Cardiology | Admitting: Interventional Cardiology

## 2017-07-30 ENCOUNTER — Ambulatory Visit (HOSPITAL_COMMUNITY): Payer: Medicare Other

## 2017-07-30 DIAGNOSIS — I712 Thoracic aortic aneurysm, without rupture, unspecified: Secondary | ICD-10-CM

## 2017-07-30 LAB — CREATININE, SERUM
CREATININE: 0.73 mg/dL (ref 0.44–1.00)
GFR calc non Af Amer: >60 mL/min (ref >60)

## 2017-07-30 MED ORDER — GADOBENATE DIMEGLUMINE 529 MG/ML IV SOLN
20.0000 mL | Freq: Once | INTRAVENOUS | Status: AC | PRN
  Administered 2017-07-30: 20 mL via INTRAVENOUS

## 2017-07-31 ENCOUNTER — Telehealth: Payer: Self-pay

## 2017-07-31 ENCOUNTER — Other Ambulatory Visit: Payer: Self-pay | Admitting: Family Medicine

## 2017-07-31 DIAGNOSIS — I712 Thoracic aortic aneurysm, without rupture, unspecified: Secondary | ICD-10-CM

## 2017-07-31 DIAGNOSIS — M1990 Unspecified osteoarthritis, unspecified site: Secondary | ICD-10-CM

## 2017-07-31 NOTE — Telephone Encounter (Signed)
Called and made patient aware of results and that we will repeat test in 6 months. Referral placed to [facility]. Patient verbalized understanding and thanked me for the call.

## 2017-07-31 NOTE — Telephone Encounter (Signed)
-----   Message from Jettie Booze, MD sent at 07/31/2017 10:59 AM EDT ----- Stable aneurysm. Can recheck same study in 6 months.  Refer to cardiac surgery if not already done.

## 2017-08-06 ENCOUNTER — Other Ambulatory Visit: Payer: Self-pay | Admitting: Interventional Cardiology

## 2017-08-06 DIAGNOSIS — R0789 Other chest pain: Secondary | ICD-10-CM

## 2017-09-10 ENCOUNTER — Encounter: Payer: Medicare Other | Admitting: Surgery

## 2017-09-14 ENCOUNTER — Other Ambulatory Visit: Payer: Self-pay | Admitting: Family Medicine

## 2017-09-17 ENCOUNTER — Encounter: Payer: Medicare Other | Admitting: Surgery

## 2017-09-23 ENCOUNTER — Encounter: Payer: Medicare Other | Admitting: Surgery

## 2017-09-25 DIAGNOSIS — H02403 Unspecified ptosis of bilateral eyelids: Secondary | ICD-10-CM | POA: Diagnosis not present

## 2017-09-25 DIAGNOSIS — H40013 Open angle with borderline findings, low risk, bilateral: Secondary | ICD-10-CM | POA: Diagnosis not present

## 2017-09-25 DIAGNOSIS — H02833 Dermatochalasis of right eye, unspecified eyelid: Secondary | ICD-10-CM | POA: Diagnosis not present

## 2017-09-30 ENCOUNTER — Encounter: Payer: Self-pay | Admitting: Surgery

## 2017-09-30 ENCOUNTER — Institutional Professional Consult (permissible substitution) (INDEPENDENT_AMBULATORY_CARE_PROVIDER_SITE_OTHER): Payer: Medicare Other | Admitting: Surgery

## 2017-09-30 ENCOUNTER — Other Ambulatory Visit: Payer: Self-pay

## 2017-09-30 VITALS — BP 158/88 | HR 96 | Resp 18 | Ht 66.0 in | Wt 224.8 lb

## 2017-09-30 DIAGNOSIS — I712 Thoracic aortic aneurysm, without rupture, unspecified: Secondary | ICD-10-CM

## 2017-09-30 NOTE — Progress Notes (Signed)
Cardiothoracic Surgery Consultation  PCP is Bufford Lope, DO Referring Provider is Jettie Booze, MD  Chief Complaint  Patient presents with  . Thoracic Aortic Aneurysm    new patient consultation, MRA Chest 07/30/2017    HPI:  The patient is an 84 year old [place] with a history of hypertension, persistent atrial fibrillation for at least the last 3 years on Eliquis, chronic diastolic congestive heart failure, and severe osteoarthritis of both knees who was previously followed by Dr. Aundra Dubin and is now followed by Dr. Irish Lack.  She had an echocardiogram in 12/2015 which showed normal left ventricular systolic function with a 4.7 cm a sending aorta.  There is no AI.  She had an MRA of the chest on 01/25/2016 which showed a 4.7 cm fusiform ascending aortic aneurysm.  The aortic root and sinuses did not appear dilated.  The aortic arch measured 3.7 cm.  The descending aorta measured 3.0 cm.  She had a follow-up MRA of the chest on 07/30/2017 that showed the diameter of the fusiform ascending aortic aneurysm to be unchanged at 4.7 cm.  She feels well and denies any chest pain or shortness of breath.  She does check her blood pressure at home and says is usually under good control.  She is under some unusual stress today because her grandson passed away within the last week.  She lives alone and has someone who helps her 3 days/week around her house.  Her husband passed away in 1987/03/20.  She had 11 children and 9 are still living. Past Medical History:  Diagnosis Date  . Atrial fibrillation ([place])   . Chronic diastolic heart failure ([place])    Echo 03/19/08: EF 55%, MAC, mild MR, moderate BAE  . GERD (gastroesophageal reflux disease)   . Hx of cardiovascular stress test    Lexiscan Myoview (10/14):  Fixed inf defect, no ischemia, study not gated  . Hx of echocardiogram    Echo (11/14):  Mild LVH, EF 60-65%, mod to severe LAE, mod RVE, mild to mod reduced RVSF, mild RAE, PASP 38  .  Hypertension   . Long term current use of anticoagulant   . Obesity   . Osteoarthritis   . Refusal of blood transfusions as patient is Jehovah's Witness     Past Surgical History:  Procedure Laterality Date  . ABDOMINAL HYSTERECTOMY    . CARPAL TUNNEL RELEASE    . CHOLECYSTECTOMY    . IRRIGATION AND DEBRIDEMENT SEBACEOUS CYST  1980   sebaceous cysts removed from tail bone  . KNEE ARTHROSCOPY Bilateral   . TUBAL LIGATION      Family History  Problem Relation Age of Onset  . Asthma Mother   . Colon cancer Mother   . Cancer Mother   . Depression Daughter   . Diabetes Unknown        sibling  . Hypertension Unknown        sibling  . Diabetes Sister   . Diabetes Brother   . Kidney cancer Son   . Cancer Son   . Cancer Daughter   . Breast cancer Daughter 71  . Depression Daughter   . Clotting disorder Daughter   . Depression Daughter   . Breast cancer Maternal Grandmother   . Heart attack Neg Hx     Social History Social History   Tobacco Use  . Smoking status: Former Smoker    Packs/day: 0.50    Years: 10.00    Pack years: 5.00  Types: Cigarettes    Last attempt to quit: 08/13/1968    Years since quitting: 49.1  . Smokeless tobacco: Never Used  Substance Use Topics  . Alcohol use: No  . Drug use: No    Current Outpatient Medications  Medication Sig Dispense Refill  . amLODipine-benazepril (LOTREL) 5-20 MG capsule Take 1 capsule by mouth daily. 90 capsule 2  . apixaban (ELIQUIS) 5 MG TABS tablet Take 1 tablet (5 mg total) by mouth 2 (two) times daily. 60 tablet 7  . calcium-vitamin D (OSCAL WITH D 500-200) 500-200 MG-UNIT per tablet Take 1 tablet by mouth daily.      . Cholecalciferol (VITAMIN D-3) 1000 UNITS CAPS Take 1,000 Units by mouth daily.     . clotrimazole (LOTRIMIN) 1 % cream APPLY TO AFFECTED AREA(S)  TOPICALLY TWO TIMES DAILY 30 g 0  . fish oil-omega-3 fatty acids 1000 MG capsule Take 2 g by mouth daily.    . furosemide (LASIX) 40 MG tablet Take  1.5 tablets (60 mg total) by mouth daily. 135 tablet 0  . MAPAP ARTHRITIS PAIN 650 MG CR tablet TAKE 1 TABLET BY MOUTH  EVERY 8 HOURS AS NEEDED FOR PAIN 120 tablet 0  . Multiple Vitamin (MULTIVITAMIN) tablet Take 1 tablet by mouth daily.      . nitroGLYCERIN (NITROSTAT) 0.4 MG SL tablet DISSOLVE 1 TAB UNDER TONGUE EVERY 5 MINS AS NEEDED FOR  CHEST PAIN. NOT TO EXCEED 3 TABS IN 15 MINS. CALL 911  IF CHEST PAIN PERSISTS. 75 tablet 1  . spironolactone (ALDACTONE) 25 MG tablet 1/2 tablet (12.5m) daily 90 tablet 0   No current facility-administered medications for this visit.     Allergies  Allergen Reactions  . Hydrocodone Nausea And Vomiting  . Morphine Itching  . Penicillins Itching    Review of Systems  Constitutional: Negative for activity change, appetite change and fatigue.  HENT: Negative.   Eyes: Negative.   Respiratory: Negative for chest tightness and shortness of breath.   Cardiovascular: Positive for leg swelling. Negative for chest pain and palpitations.  Endocrine: Negative.   Genitourinary: Negative.   Musculoskeletal: Positive for arthralgias.       Both knees  Skin: Negative.   Neurological: Negative.  Negative for dizziness and syncope.  Hematological: Negative.   Psychiatric/Behavioral: Negative.     BP (!) 158/88 (BP Location: Right Arm, Patient Position: Sitting, Cuff Size: Normal)   Pulse 96   Resp 18   Ht _0  (1.676 m)   Wt 224 lb 12.8 oz (102 kg)   SpO2 98% Comment: RA  BMI 36.28 kg/m  Physical Exam  Constitutional: She is oriented to person, place, and time. She appears well-developed and well-nourished. No distress.  HENT:  Head: Normocephalic and atraumatic.  Mouth/Throat: Oropharynx is clear and moist.  Eyes: Pupils are equal, round, and reactive to light. EOM are normal.  Neck: Normal range of motion. Neck supple. No JVD present. No thyromegaly present.  Cardiovascular: Normal rate, regular rhythm and normal heart sounds.  No murmur  heard. Pulmonary/Chest: Effort normal and breath sounds normal.  Abdominal: Soft. There is no tenderness.  Musculoskeletal: She exhibits edema.  In ankles bilat  Lymphadenopathy:    She has no cervical adenopathy.  Neurological: She is alert and oriented to person, place, and time.  Skin: Skin is warm and dry.  Psychiatric: She has a normal mood and affect.     Diagnostic Tests:  CLINICAL DATA:  Thoracic ascending aortic aneurysm, routine follow-up  EXAM: MRA CHEST WITH OR WITHOUT CONTRAST  TECHNIQUE: Angiographic images of the chest were obtained using MRA technique with intravenous contrast.  CONTRAST:  62m MULTIHANCE GADOBENATE DIMEGLUMINE 529 MG/ML IV SOLN  COMPARISON:  07/29/2016  FINDINGS: VASCULAR  Aorta: Thoracic aortic transverse measurements as follows:  4.4 cm sinuses of Valsalva  3.7 cm sino-tubular junction  4.7 cm mid ascending  4.1 cm distal ascending/proximal arch  2.9 cm distal arch  3.2 cm proximal descending  2.6 cm distal descending  No dissection or stenosis. Classic 3 vessel brachiocephalic arterial origin anatomy without proximal stenosis. Visualized proximal abdominal aorta unremarkable.  Heart: Unremarkable  Pulmonary Arteries: Dilated proximally. Limited segmental and subsegmental evaluation.  Other: None  NON-VASCULAR  Spinal cord: Negative limited evaluation  Brachial plexus: Negative  Muscles and tendons: Negative  Bones: Heterogenous marrow signal in the thoracic spine, incompletely characterized.  Joints: Negative limited evaluation  IMPRESSION: VASCULAR  1. Stable 4.7 cm ascending thoracic aortic aneurysm. Recommend semi-annual imaging followup by CTA or MRA and referral to cardiothoracic surgery if not already obtained. This recommendation follows 2010 ACCF/AHA/AATS/ACR/ASA/SCA/SCAI/SIR/STS/SVM Guidelines for the Diagnosis and Management of Patients With Thoracic Aortic Disease.  Circulation. 2010; 121: [id number]. Dilated central pulmonary arteries suggesting pulmonary arterial hypertension.  NON-VASCULAR  1. No acute findings.   Electronically Signed   By: DLucrezia EuropeM.D.   On: 07/30/2017 18:31   Impression:  This 84year old woman has a 4.7 cm fusiform ascending aortic aneurysm which is unchanged on MRA dating back to 01/25/2016.  This is well below the surgical threshold of 5.5 cm and I would be very conservative and an 84year old [place]  I reviewed the MRA images with her and her nephew and answered their questions.  I stressed the importance of maintaining good blood pressure control in preventing further enlargement and aortic dissection.  I think it is worthwhile repeating this study in 1 year.  Plan:  She will return to see me in 1 year with an MRA of the chest.   I spent 45 minutes performing this consultation and > 50% of this time was spent face to face counseling and coordinating the care of this patient's fusiform ascending aortic aneurysm.   BGaye Pollack MD [facility] ([phone]

## 2017-11-03 ENCOUNTER — Other Ambulatory Visit: Payer: Self-pay

## 2017-11-03 NOTE — Patient Outreach (Signed)
[place] [facility]) Care Management  11/03/2017  Bianca [name] [birth year] [id number]   Medication Adherence call to [name] spoke with patient she still has medication and does not need any at this time patient is due on Amlodepine/ Benazepril 5/20 mg patient also said she use to get pill pack from [facility]. [name] is showing past due under [place].   [facility] Direct Dial [phone]  Fax [phone] [email or web address]

## 2017-11-11 ENCOUNTER — Other Ambulatory Visit: Payer: Self-pay | Admitting: Interventional Cardiology

## 2017-11-11 ENCOUNTER — Other Ambulatory Visit: Payer: Self-pay | Admitting: Family Medicine

## 2017-11-11 DIAGNOSIS — I4819 Other persistent atrial fibrillation: Secondary | ICD-10-CM

## 2017-11-11 DIAGNOSIS — I5032 Chronic diastolic (congestive) heart failure: Secondary | ICD-10-CM

## 2017-11-11 DIAGNOSIS — R0602 Shortness of breath: Secondary | ICD-10-CM

## 2017-11-11 DIAGNOSIS — I10 Essential (primary) hypertension: Secondary | ICD-10-CM

## 2017-11-24 DIAGNOSIS — M170 Bilateral primary osteoarthritis of knee: Secondary | ICD-10-CM | POA: Diagnosis not present

## 2017-12-15 DIAGNOSIS — M170 Bilateral primary osteoarthritis of knee: Secondary | ICD-10-CM | POA: Diagnosis not present

## 2017-12-23 DIAGNOSIS — M170 Bilateral primary osteoarthritis of knee: Secondary | ICD-10-CM | POA: Diagnosis not present

## 2017-12-30 DIAGNOSIS — M170 Bilateral primary osteoarthritis of knee: Secondary | ICD-10-CM | POA: Diagnosis not present

## 2018-01-30 ENCOUNTER — Other Ambulatory Visit: Payer: Medicare Other | Admitting: *Deleted

## 2018-01-30 DIAGNOSIS — I712 Thoracic aortic aneurysm, without rupture, unspecified: Secondary | ICD-10-CM

## 2018-01-30 DIAGNOSIS — M1711 Unilateral primary osteoarthritis, right knee: Secondary | ICD-10-CM | POA: Diagnosis not present

## 2018-01-30 LAB — BASIC METABOLIC PANEL
BUN / CREAT RATIO: 21 (ref 12–28)
BUN: 13 mg/dL (ref 8–27)
CO2: 23 mmol/L (ref 20–29)
CREATININE: 0.62 mg/dL (ref 0.57–1.00)
Calcium: 9.8 mg/dL (ref 8.7–10.3)
Chloride: 100 mmol/L (ref 96–106)
GFR, EST AFRICAN AMERICAN: 95 mL/min/{1.73_m2} (ref >59)
GFR, EST NON AFRICAN AMERICAN: 82 mL/min/{1.73_m2} (ref >59)
GLUCOSE: 112 mg/dL — AB (ref 65–99)
Potassium: 3.8 mmol/L (ref 3.5–5.2)
SODIUM: 140 mmol/L (ref 134–144)

## 2018-02-04 ENCOUNTER — Ambulatory Visit (HOSPITAL_COMMUNITY): Admission: RE | Admit: 2018-02-04 | Payer: Medicare Other | Source: Ambulatory Visit

## 2018-02-05 ENCOUNTER — Other Ambulatory Visit: Payer: Self-pay | Admitting: Interventional Cardiology

## 2018-02-05 NOTE — Telephone Encounter (Signed)
Pt last saw Dr Irish Lack 06/18/17, last labs 01/30/18 Creat 0.62, age 85, weight 103kg, based on specified criteria pt is on appropriate dosage of Eliquis 5mg  BID.  Will refill rx.

## 2018-02-17 ENCOUNTER — Other Ambulatory Visit: Payer: Self-pay | Admitting: Interventional Cardiology

## 2018-02-17 ENCOUNTER — Ambulatory Visit (HOSPITAL_COMMUNITY)
Admission: RE | Admit: 2018-02-17 | Discharge: 2018-02-17 | Disposition: A | Discharge status: Home / Self Care | Payer: Medicare Other | Source: Ambulatory Visit | Attending: Interventional Cardiology | Admitting: Interventional Cardiology

## 2018-02-17 DIAGNOSIS — I712 Thoracic aortic aneurysm, without rupture, unspecified: Secondary | ICD-10-CM

## 2018-03-17 DIAGNOSIS — H353131 Nonexudative age-related macular degeneration, bilateral, early dry stage: Secondary | ICD-10-CM | POA: Diagnosis not present

## 2018-03-17 DIAGNOSIS — H04222 Epiphora due to insufficient drainage, left lacrimal gland: Secondary | ICD-10-CM | POA: Diagnosis not present

## 2018-03-17 DIAGNOSIS — Z961 Presence of intraocular lens: Secondary | ICD-10-CM | POA: Diagnosis not present

## 2018-03-17 DIAGNOSIS — H40013 Open angle with borderline findings, low risk, bilateral: Secondary | ICD-10-CM | POA: Diagnosis not present

## 2018-03-25 ENCOUNTER — Ambulatory Visit (INDEPENDENT_AMBULATORY_CARE_PROVIDER_SITE_OTHER): Payer: Medicare Other | Admitting: Family Medicine

## 2018-03-25 ENCOUNTER — Other Ambulatory Visit: Payer: Self-pay

## 2018-03-25 ENCOUNTER — Encounter: Payer: Self-pay | Admitting: Family Medicine

## 2018-03-25 VITALS — BP 142/64 | HR 79 | Temp 98.0°F | Ht 66.0 in | Wt 222.0 lb

## 2018-03-25 DIAGNOSIS — L989 Disorder of the skin and subcutaneous tissue, unspecified: Secondary | ICD-10-CM

## 2018-03-25 DIAGNOSIS — L918 Other hypertrophic disorders of the skin: Secondary | ICD-10-CM | POA: Insufficient documentation

## 2018-03-25 DIAGNOSIS — B372 Candidiasis of skin and nail: Secondary | ICD-10-CM | POA: Diagnosis not present

## 2018-03-25 DIAGNOSIS — Z23 Encounter for immunization: Secondary | ICD-10-CM | POA: Diagnosis not present

## 2018-03-25 DIAGNOSIS — L304 Erythema intertrigo: Secondary | ICD-10-CM | POA: Insufficient documentation

## 2018-03-25 MED ORDER — TETANUS-DIPHTH-ACELL PERTUSSIS 5-2.5-18.5 LF-MCG/0.5 IM SUSP: 0.5000 mL | Freq: Once | INTRAMUSCULAR | 0 refills | Status: AC

## 2018-03-25 NOTE — Assessment & Plan Note (Addendum)
Symptomatic skin tag was snipped off using alcohol for cleansing and sterile iris scissors. Local anesthesia was used. This pathognomonic lesion are not sent for pathology.

## 2018-03-25 NOTE — Progress Notes (Signed)
    Subjective:  Bianca [name] is a 85 y.o. female who presents to the [facility] today with a chief complaint of skin concerns.   HPI:  Patient is here with multiple skin concerns.  She has a skin tag on her right neck that catches on her clothing frequently giving her some discomfort.  Does not bleed. She also has 2 lesions on her right forehead that she would like removed.  She had similar lesions elsewhere on her face that had previously been frozen off.  No bleeding or rapid expansion She has had longstanding rash under both breasts and in her groin area.  She uses clotrimazole cream with some relief but the rash is persistent and very itchy.  She recently bought new clothing that supposed to separate her skin folds there but has not yet tried it. No fever/chills, swelling.    ROS: Per HPI  Social Hx: She reports that she quit smoking about 49 years ago. Her smoking use included cigarettes. She has a 5.00 pack-year smoking history. She has never used smokeless tobacco. She reports that she does not drink alcohol or use drugs.   Objective:  Physical Exam: BP (!) 142/64   Pulse 79   Temp 98 F (36.7 C) (Oral)   Ht 5\' 6"  (1.676 m)   Wt 222 lb (100.7 kg)   SpO2 99%   BMI 35.83 kg/m   Gen: NAD, resting comfortably Skin: several benign skin tags on face and neck bilaterally, largest is on R side of neck. 2 hyperkeratotic skin lesions on upper R forehead. See picture below. Erythematous areas of skin under both breasts and groin area without skin maceration. Neuro: grossly normal, moves all extremities Psych: Normal affect and thought content      Assessment/Plan:  Skin tag Symptomatic skin tag was snipped off using alcohol for cleansing and sterile iris scissors. Local anesthesia was used. This pathognomonic lesion are not sent for pathology.  Skin lesion of face 2 lesions on forehead that are hyperkeratotic but appear benign. Likely SK. Less likely AK or BCC. Precepted with Dr.  Okey Dupre. Cryotherapy performed today.  Intertriginous candidiasis Mildly erythematous fungal rash that is somewhat controlled by topical antifungal. Likely keep recurring due to moist environment from prominent skin folds. Recommended supportive care at home by letting intertriginous areas dry out periodically.     Bufford Lope, DO PGY-3, [facility] 03/25/2018 8:54 AM

## 2018-03-25 NOTE — Assessment & Plan Note (Signed)
2 lesions on forehead that are hyperkeratotic but appear benign. Likely SK. Less likely AK or BCC. Precepted with Dr. Okey Dupre. Cryotherapy performed today.

## 2018-03-25 NOTE — Patient Instructions (Signed)
Keep covered for the first 24 hours. Clean with warm water and soap. Put vaseline on until heals.  Cryosurgery for Skin Conditions, Care After These instructions give you information on caring for yourself after your procedure. Your doctor may also give you more specific instructions. Call your doctor if you have any problems or questions after your procedure. Follow these instructions at home: Caring for the treated area   Follow instructions from your doctor about how to take care of the treated area. Make sure you: ? Keep the area covered with a bandage (dressing) until it heals, or for as long as told by your doctor. ? Wash your hands with soap and water before you change your bandage. If you do not have soap and water, use hand sanitizer. ? Change your bandage as told by your doctor. ? Keep the bandage and the treated area clean and dry. If the bandage gets wet, change it right away. ? Clean the treated area with soap and water.  Check the treated area every day for signs of infection. Check for: ? More redness, swelling, or pain. ? More fluid or blood. ? Warmth. ? Pus or a bad smell. General instructions  Do not pick at your blister. Do not try to break it open. This can cause infection and scarring.  Do not put any medicine, cream, or lotion on the treated area unless told by your doctor.  Take over-the-counter and prescription medicines only as told by your doctor.  Keep all follow-up visits as told by your doctor. This is important. Contact a doctor if:  You have more redness, swelling, or pain around the treated area.  You have more fluid or blood coming from the treated area.  The treated area feels warm to the touch.  You have pus or a bad smell coming from the treated area.  Your blister gets large and painful. Get help right away if:  You have a fever and have redness spreading from the treated area. Summary  You should keep the treated area and your bandage  clean and dry.  Check the treated area every day for signs of infection. Signs include fluid, pus, warmth, or having more redness, swelling, or pain.  Do not pick at your blister. Do not try to break it open. This information is not intended to replace advice given to you by your health care provider. Make sure you discuss any questions you have with your health care provider. Document Released: 03/25/2011 Document Revised: 11/20/2015 Document Reviewed: 11/20/2015 Elsevier Interactive Patient Education  2019 [employer].

## 2018-03-25 NOTE — Assessment & Plan Note (Signed)
Mildly erythematous fungal rash that is somewhat controlled by topical antifungal. Likely keep recurring due to moist environment from prominent skin folds. Recommended supportive care at home by letting intertriginous areas dry out periodically.

## 2018-04-04 ENCOUNTER — Other Ambulatory Visit: Payer: Self-pay | Admitting: Family Medicine

## 2018-05-14 ENCOUNTER — Other Ambulatory Visit: Payer: Self-pay | Admitting: Family Medicine

## 2018-05-14 DIAGNOSIS — Z1231 Encounter for screening mammogram for malignant neoplasm of breast: Secondary | ICD-10-CM

## 2018-06-01 ENCOUNTER — Telehealth: Payer: Self-pay | Admitting: *Deleted

## 2018-06-01 ENCOUNTER — Encounter: Payer: Self-pay | Admitting: Physician Assistant

## 2018-06-01 NOTE — Progress Notes (Signed)
Virtual Visit via Telephone Note   This visit type was conducted due to national recommendations for restrictions regarding the COVID-19 Pandemic (e.g. social distancing) in an effort to limit this patient's exposure and mitigate transmission in our community.  Due to her co-morbid illnesses, this patient is at least at moderate risk for complications without adequate follow up.  This format is felt to be most appropriate for this patient at this time.  The patient did not have access to video technology/had technical difficulties with video requiring transitioning to audio format only (telephone).  All issues noted in this document were discussed and addressed.  No physical exam could be performed with this format.  Please refer to the patient's chart for her  consent to telehealth for [facility].   Date:  06/02/2018   ID:  [name], DOB [birth year], MRN [id number]  Patient Location: Home Provider Location: Home  PCP:  Bufford Lope, DO  Cardiologist:  Larae Grooms, MD  Electrophysiologist:  None   Evaluation Performed:  Follow-Up Visit  Chief Complaint:  F/u atrial fib, CHF, aneurysm  History of Present Illness:    Bianca [name] is a 85 y.o. female with permanent atrial fibrillation, chronic diastolic CHF, ascending thoracic aorta, GERD, HTN, obesity, OA, refusal of blood products (Jehovah's witness) who presents for virtual follow-up. Last echo 12/2015 showed normal EF, mild LVH, 27mmm ascending aortc aneurysm, severe LAE, mildly dilated RV with mildly reduced RVF, mild TR/PR, mildly increased PASP. MRA of the chest 02/2018 showed 4.6cm (stable), dilated main PA suggestive of pulm HTN, thyroid nodules. She is now seeing Dr. Cyndia Bent for this. Holter monitor in the past showed bradycardia/pauses, so she is not on nodal blockers. Last labs 01/2018 showed K 3.8, Cr 0.62, 06/2017 CBC OK.  She is seen for virtual follow-up today and states she is feeling stable and doing "pretty good."  She has chronic unchanged dyspnea on exertion. Her swelling tends to come and go. She says today it's a little more noticeable. It goes down at night and then comes back when she's up moving around. She tries to do a little bit of exercise on an exercise bike and denies any angina. She reports compliance with medication. She has not been checking her BP recently. She is surprised to see it was elevated today as she states that's unusual for her at home. She just took her medication a short while ago. The patient does not have symptoms concerning for COVID-19 infection (fever, chills, cough, or new shortness of breath).    Past Medical History:  Diagnosis Date   Bradycardia    a. Holter monitor in the past showed bradycardia/pauses, so she is not on nodal blockers.    Chronic diastolic heart failure ([place])    a. 12/2015 showed normal EF, mild LVH, 52mmm ascending aortc aneurysm, severe LAE, mildly dilated RV with mildly reduced RVF, mild TR/PR, mildly increased PASP.   GERD (gastroesophageal reflux disease)    Hx of cardiovascular stress test    Lexiscan Myoview (10/14):  Fixed inf defect, no ischemia, study not gated   Hx of echocardiogram    Echo (11/14):  Mild LVH, EF 60-65%, mod to severe LAE, mod RVE, mild to mod reduced RVSF, mild RAE, PASP 38   Hypertension    Long term current use of anticoagulant    Mild pulmonary hypertension (HCC)    Obesity    Osteoarthritis    Permanent atrial fibrillation    Refusal of blood  transfusions as patient is Sales promotion account executive Witness    Thoracic ascending aortic aneurysm (HCC)    Past Surgical History:  Procedure Laterality Date   ABDOMINAL HYSTERECTOMY     CARPAL TUNNEL RELEASE     CHOLECYSTECTOMY     IRRIGATION AND DEBRIDEMENT SEBACEOUS CYST  1980   sebaceous cysts removed from tail bone   KNEE ARTHROSCOPY Bilateral    TUBAL LIGATION       Current Meds  Medication Sig   calcium-vitamin D (OSCAL WITH D 500-200) 500-200 MG-UNIT  per tablet Take 1 tablet by mouth daily.     Cholecalciferol (VITAMIN D-3) 1000 UNITS CAPS Take 1,000 Units by mouth daily.    clotrimazole (LOTRIMIN) 1 % cream APPLY TO AFFECTED AREA(S)  TOPICALLY TWO TIMES DAILY   ELIQUIS 5 MG TABS tablet TAKE 1 TABLET BY MOUTH TWO  TIMES DAILY   fish oil-omega-3 fatty acids 1000 MG capsule Take 2 g by mouth daily.   FLUAD 0.5 ML SUSY ADM 0.5ML IM UTD   furosemide (LASIX) 40 MG tablet Take 1.5 tablets (60 mg total) by mouth daily.   LOTREL 5-20 MG capsule TAKE 1 CAPSULE BY MOUTH  DAILY   MAPAP ARTHRITIS PAIN 650 MG CR tablet TAKE 1 TABLET BY MOUTH  EVERY 8 HOURS AS NEEDED FOR PAIN   Multiple Vitamin (MULTIVITAMIN) tablet Take 1 tablet by mouth daily.     nitroGLYCERIN (NITROSTAT) 0.4 MG SL tablet DISSOLVE 1 TAB UNDER TONGUE EVERY 5 MINS AS NEEDED FOR  CHEST PAIN. NOT TO EXCEED 3 TABS IN 15 MINS. CALL 911  IF CHEST PAIN PERSISTS.   spironolactone (ALDACTONE) 25 MG tablet TAKE ONE-HALF TABLET BY  MOUTH DAILY     Allergies:   Hydrocodone; Morphine; and Penicillins   Social History   Tobacco Use   Smoking status: Former Smoker    Packs/day: 0.50    Years: 10.00    Pack years: 5.00    Types: Cigarettes    Last attempt to quit: 08/13/1968    Years since quitting: 49.8   Smokeless tobacco: Never Used  Substance Use Topics   Alcohol use: No   Drug use: No     Family Hx: The patient's family history includes Asthma in her mother; Breast cancer in her maternal grandmother; Breast cancer (age of onset: 30) in her daughter; Cancer in her daughter, mother, and son; Clotting disorder in her daughter; Colon cancer in her mother; Depression in her daughter, daughter, and daughter; Diabetes in her brother, sister, and unknown relative; Hypertension in her unknown relative; Kidney cancer in her son. There is no history of Heart attack.  ROS:   Please see the history of present illness.    All other systems reviewed and are negative.   Prior CV  studies:   The following studies were reviewed today:  Most recent pertinent cardiac studies are outlined above.   Labs/Other Tests and Data Reviewed:    EKG:  An ECG dated 12/26/16 was personally reviewed today and demonstrated:  atrial fib 87bpm, left axis deviation, IRBBB, nonspecific STT changes  Recent Labs: 06/18/2017: Hemoglobin 12.5; Platelets 199 01/30/2018: BUN 13; Creatinine, Ser 0.62; Potassium 3.8; Sodium 140   Recent Lipid Panel Lab Results  Component Value Date/Time   CHOL 152 12/26/2016 11:25 AM   TRIG 79 12/26/2016 11:25 AM   HDL 49 12/26/2016 11:25 AM   CHOLHDL 3.1 12/26/2016 11:25 AM   CHOLHDL 3.6 12/21/2014 03:23 PM   LDLCALC 87 12/26/2016 11:25 AM   LDLDIRECT  119 (H) 06/14/2009 06:19 PM    Wt Readings from Last 3 Encounters:  06/02/18 220 lb (99.8 kg)  03/25/18 222 lb (100.7 kg)  09/30/17 224 lb 12.8 oz (102 kg)     Objective:    Vital Signs:  BP (!) 155/86    Pulse 71    Ht 5\' 6"  (1.676 m)    Wt 220 lb (99.8 kg)    BMI 35.51 kg/m    VITAL SIGNS:  reviewed General - elderly female in no acute distress Pulm - No labored breathing, no coughing during visit, no audible wheezing, speaking in full sentences Neuro - A+Ox3, no slurred speech, answers questions appropriately Psych - Pleasant affect     ASSESSMENT & PLAN:    1. Permanent atrial fibrillation - heart rate controlled and patient has been relatively asymptomatic. Denies any unusual bleeding. CBC would be coming due next month, should be checked at minimum once per year. We will count this visit as her appointment, but have her come in later in June for CBC/metabolic panel to ensure stability since she is on anticoagulation. 2. Chronic diastolic CHF with mild pulmonary hypertension - patient reports intermittent edema, a little more prominent today. I wonder if this is related to her elevated BP. We discussed increasing her Lasix but she declines, citing she feels she has about as much on board as  she can handle, urination-wise. Discussed alternatives such as elevation/compression. Reviewed 2g sodium restriction, 2L fluid restriction, daily weights with patient. 3. Ascending thoracic aortic aneurysm - followed by Dr. Cyndia Bent now. Discussed aneurysm precautions (relaying diagnosis to first degree relatives, BP control, activity precautions and avoidance of fluoroquinolones). She is not on BB due to hx of bradycardia. Will get LFTs and lipids when she comes in for labs next month to trend - might consider statin given her aneurysm if not well controlled. This was last checked in 2018. 4. Essential HTN - BP elevated today but just took her medication a short while ago. It has frequently been elevated in the office before but quite variable. She would like to trend it before making any changes. I gave her instructions for checking it the next 3 days and asked her to call on Friday 5/22 with update on the readings. Since she does not wish to increase her diuretic I suspect we could increase Lotrel to 5/40mg  (would be hesitant to increase the amlodipine component given her edema). I also asked she discuss the thyroid nodules seen on MRA scan with her PCP. She verbalized understanding.   COVID-19 Education: The signs and symptoms of COVID-19 were discussed with the patient and how to seek care for testing (follow up with PCP or arrange E-visit).The importance of social distancing was discussed today.  Time:   Today, I have spent 17 minutes with the patient with telehealth technology discussing the above problems.     Medication Adjustments/Labs and Tests Ordered: Current medicines are reviewed at length with the patient today.  Concerns regarding medicines are outlined above.    Disposition:  Follow up September 2020 for in-person visit to follow up blood pressure and edema  Signed, Charlie Pitter, PA-C  06/02/2018 1:57 PM    [facility]

## 2018-06-01 NOTE — Telephone Encounter (Signed)

## 2018-06-02 ENCOUNTER — Encounter: Payer: Self-pay | Admitting: Physician Assistant

## 2018-06-02 ENCOUNTER — Other Ambulatory Visit: Payer: Self-pay

## 2018-06-02 ENCOUNTER — Telehealth (INDEPENDENT_AMBULATORY_CARE_PROVIDER_SITE_OTHER): Payer: Medicare Other | Admitting: Physician Assistant

## 2018-06-02 VITALS — BP 155/86 | HR 71 | Ht 66.0 in | Wt 220.0 lb

## 2018-06-02 DIAGNOSIS — I5032 Chronic diastolic (congestive) heart failure: Secondary | ICD-10-CM | POA: Diagnosis not present

## 2018-06-02 DIAGNOSIS — I110 Hypertensive heart disease with heart failure: Secondary | ICD-10-CM | POA: Diagnosis not present

## 2018-06-02 DIAGNOSIS — Z7901 Long term (current) use of anticoagulants: Secondary | ICD-10-CM

## 2018-06-02 NOTE — Patient Instructions (Addendum)
Medication Instructions:  Your physician recommends that you continue on your current medications as directed. Please refer to the Current Medication list given to you today.  If you need a refill on your cardiac medications before your next appointment, please call your pharmacy.   Lab work: 07/06/2018 COME TO THE OFFICE AT 11:00 FOR LAB WORK, MAKE SURE YOU ARE FASTING.. NOTHING TO EAT OR DRINK AFTER MIDNIGHT THE NIGHT BEFORE:  CMET, LIPID,  & CBC  If you have labs (blood work) drawn today and your tests are completely normal, you will receive your results only by: Marland Kitchen MyChart Message (if you have MyChart) OR . A paper copy in the mail If you have any lab test that is abnormal or we need to change your treatment, we will call you to review the results.  Testing/Procedures: None ordered   Follow-Up: At [facility], you and your health needs are our priority.  As part of our continuing mission to provide you with exceptional heart care, we have created designated Provider Care Teams.  These Care Teams include your primary Cardiologist (physician) and Advanced Practice Providers (APPs -  Physician Assistants and Nurse Practitioners) who all work together to provide you with the care you need, when you need it. You will need a follow up appointment in 4 months.  Please call our office 2 months in advance to schedule this appointment.  You may see Larae Grooms, MD or one of the following Advanced Practice Providers on your designated Care Team:   [place], PA-C Melina Copa, PA-C . Ermalinda Barrios, PA-C  Any Other Special Instructions Will Be Listed Below (If Applicable).  Please check your blood pressure once a day for the next 3 days and call us on Friday 06/05/18 with those readings for our review. If your blood pressure is running high, we may adjust your medicine.   For your leg swelling, please try to elevate your legs as much as possible. If you are having trouble using  compression hose, you can try wrapping ACE bandages on your lower extremities to push the fluid back up towards your heart.   For patients with congestive heart failure, we give them these special instructions:  1. Follow a low-salt diet - you are allowed no more than 2,000mg  of sodium per day. Watch your fluid intake. In general, you should not be taking in more than 2 liters of fluid per day (no more than 8 glasses per day). This includes sources of water in foods like soup, coffee, tea, milk, etc.  2. Weigh yourself on the same scale at same time of day and keep a log.  3. Call your doctor: (Anytime you feel any of the following symptoms)  - 3lb weight gain overnight or 5lb within a few days  - Shortness of breath, with or without a dry hacking cough  - Swelling in the hands, feet or stomach  - If you have to sleep on extra pillows at night in order to breathe   IT IS IMPORTANT TO LET YOUR DOCTOR KNOW EARLY ON IF YOU ARE HAVING SYMPTOMS SO WE CAN HELP YOU!   ---   Mainstays of therapy for aneurysms include good blood pressure control, healthy lifestyle, and avoiding fluoroquinolone antibiotic medications (such as those in the "Cipro" class, ending in "floxacin") due to risk of damage to the aorta. This is a finding I would expect to be monitored periodically by their primary cardiologist. Since aneurysms can run in families, you should  discuss your diagnosis with first degree relatives as they may need to be screened for this. Regular mild-moderate physical exercise is OK but avoid heavy lifting/weight lifting over 30lbs, chopping wood, shoveling snow or digging heavy earth with a shovel.

## 2018-06-04 ENCOUNTER — Telehealth: Payer: Self-pay

## 2018-06-04 NOTE — Progress Notes (Signed)
Provider's schedule not open for July, will call back next week to schedule this appointment.  Patient aware.  Jazmin Hartsell,CMA

## 2018-06-04 NOTE — Progress Notes (Signed)
Attempted to call pt to make a telemedicine appt to discuss thyroid nodules. No one answered. There is no voice mail set up. Will call later on. Salvatore Marvel, CMA

## 2018-06-04 NOTE — Telephone Encounter (Signed)
Attempted to call pt to make a telemedicine appt to discuss thyroid nodules. No one answered. There is no voice mail set up. Will call later on. Salvatore Marvel, CMA

## 2018-06-05 ENCOUNTER — Other Ambulatory Visit: Payer: Self-pay | Admitting: Family Medicine

## 2018-06-05 ENCOUNTER — Telehealth: Payer: Self-pay | Admitting: Interventional Cardiology

## 2018-06-05 ENCOUNTER — Other Ambulatory Visit: Payer: Self-pay | Admitting: Interventional Cardiology

## 2018-06-05 DIAGNOSIS — I4819 Other persistent atrial fibrillation: Secondary | ICD-10-CM

## 2018-06-05 DIAGNOSIS — R0602 Shortness of breath: Secondary | ICD-10-CM

## 2018-06-05 DIAGNOSIS — I10 Essential (primary) hypertension: Secondary | ICD-10-CM

## 2018-06-05 DIAGNOSIS — I5032 Chronic diastolic (congestive) heart failure: Secondary | ICD-10-CM

## 2018-06-05 NOTE — Telephone Encounter (Signed)
° ° ° ° ° °  1. What are your last 5 BP readings? 164/94 HR 78, 149/74 HR 77, 114/71 HR 85  2. Are you having any other symptoms (ex. Dizziness, headache, blurred vision, passed out)? no  3. What is your BP issue? Patient calling to report BP as instructed from virtual visit 5/19

## 2018-06-05 NOTE — Telephone Encounter (Signed)
Called pt and she don't remember what time of day, she states that she didn't pay any attention. She stated she checked it 1 time Wed, Thurs, & Fri.

## 2018-06-05 NOTE — Telephone Encounter (Signed)
Pt hs been made aware to monitor her bp daily, 3 hours after she takes her bp meds, and call with a report on Tuesday, 06/09/2018. Pt verbalized understanding.

## 2018-06-05 NOTE — Telephone Encounter (Signed)
OK! Since last BP was on the lower side, I don't want to make any change until we see more of a trend. Please have her check her BP once a day 3 hours after taking her morning medicines and call us on Tuesday 5/23 with those readings. Dayna Dunn PA-C

## 2018-06-05 NOTE — Telephone Encounter (Signed)
Since these BP readings are quite variable, can you get a sense for when they were taken (date / time of day) and what timing relationship to taking her medicines? Please thank Ms. Bianca [name] for remembering. Roshad Hack PA-C

## 2018-06-09 NOTE — Progress Notes (Signed)
appt made

## 2018-06-09 NOTE — Telephone Encounter (Signed)
Patient called today to give her BP readings:  Sunday 134/87 HR 84 Monday: 136/86 HR 80 Today : 121/69 HR 71

## 2018-06-09 NOTE — Telephone Encounter (Signed)
Spoke with pt. Made telemed visit for 06/16/2018 at 11:100 to discuss thyroid nodules. Salvatore Marvel, CMA

## 2018-06-10 NOTE — Telephone Encounter (Signed)
Given her advanced age and h/o bradycardia, will hold course for now then especially since she periodically swings down to 114-121. It looks like Dr. Irish Lack was previously satisfied with a BP of 138/80 (similar to these readings). Please remind her to periodically check her BP (always at least 3 hours after meds) and call if tending to run >135 on the top number on a consistent basis. Lynise Porr PA-C

## 2018-06-10 NOTE — Telephone Encounter (Signed)
Call placed to pt re: reported blood pressure readings. Pt has been made aware that we will not make any changes at this time with her medications.  She has been advised to continue to check her bp, periodically, at least 3 hours after her meds, and call us if she notices bp continually runs over 135 on the top. Pt verbalized understanding.

## 2018-06-16 ENCOUNTER — Other Ambulatory Visit: Payer: Self-pay

## 2018-06-16 ENCOUNTER — Telehealth (INDEPENDENT_AMBULATORY_CARE_PROVIDER_SITE_OTHER): Payer: Medicare Other | Admitting: Family Medicine

## 2018-06-16 DIAGNOSIS — E041 Nontoxic single thyroid nodule: Secondary | ICD-10-CM

## 2018-06-16 NOTE — Progress Notes (Signed)
[place] Telemedicine Visit  Patient consented to have virtual visit. Method of visit: Telephone  Encounter participants: Patient: Bianca [name] - located at home Provider: Bufford Lope - located at [facility] Others (if applicable): none  Chief Complaint: thyroid nodules  HPI:  Patient states that she was told that they found thyroid nodules on imaging recently.  She has never noticed any lumps in her neck.  She is not having any fatigue, heat or cold intolerance, hair or skin changes.  Her nails have always been brittle.  She has not noticed any unintentional weight loss or gain.  ROS: per HPI  Pertinent PMHx: A. fib, obesity, hypertension  Exam:  Respiratory: Normal work of breathing, speaks in full sentences  Assessment/Plan:  Thyroid nodule Incidental finding from MRI done on 02/17/2018.  She is asymptomatic.  Joint decision making with patient regarding whether to further work-up these nodules.  Patient would like to proceed.  Check TSH.  Ordered as future lab.  Patient already has a lab appointment later this month.    Time spent during visit with patient: 8 minutes  Bufford Lope, DO PGY-3, [facility] 06/16/2018 11:03 AM

## 2018-06-16 NOTE — Assessment & Plan Note (Signed)
Incidental finding from MRI done on 02/17/2018.  She is asymptomatic.  Joint decision making with patient regarding whether to further work-up these nodules.  Patient would like to proceed.  Check TSH.  Ordered as future lab.  Patient already has a lab appointment later this month.

## 2018-06-16 NOTE — Progress Notes (Signed)
Reason for visit:f/u throat nodules Vitals performed at home:  None performed Nurse Assessment:negative assessment Depression screening in chart: done Advanced Directives: done

## 2018-07-03 ENCOUNTER — Telehealth: Payer: Self-pay | Admitting: *Deleted

## 2018-07-03 NOTE — Telephone Encounter (Signed)
    COVID-19 Pre-Screening Questions:  . In the past 7 to 10 days have you had a cough,  shortness of breath, headache, congestion, fever (100 or greater) body aches, chills, sore throat, or sudden loss of taste or sense of smell? . Have you been around anyone with known Covid 19. . Have you been around anyone who is awaiting Covid 19 test results in the past 7 to 10 days? . Have you been around anyone who has been exposed to Covid 19, or has mentioned symptoms of Covid 19 within the past 7 to 10 days?  If you have any concerns/questions about symptoms patients report during screening (either on the phone or at threshold). Contact the provider seeing the patient or DOD for further guidance.  If neither are available contact a member of the leadership team.           Contacted patient via telephone call. All no to Covid 19 questions . Has a mask.KB 

## 2018-07-06 ENCOUNTER — Other Ambulatory Visit: Payer: Medicare Other | Admitting: *Deleted

## 2018-07-06 ENCOUNTER — Other Ambulatory Visit: Payer: Self-pay

## 2018-07-06 DIAGNOSIS — I5032 Chronic diastolic (congestive) heart failure: Secondary | ICD-10-CM

## 2018-07-06 DIAGNOSIS — Z7901 Long term (current) use of anticoagulants: Secondary | ICD-10-CM

## 2018-07-06 DIAGNOSIS — I110 Hypertensive heart disease with heart failure: Secondary | ICD-10-CM | POA: Diagnosis not present

## 2018-07-06 LAB — CBC
Hematocrit: 39 % (ref 34.0–46.6)
Hemoglobin: 12.2 g/dL (ref 11.1–15.9)
MCH: 28.1 pg (ref 26.6–33.0)
MCHC: 31.3 g/dL — ABNORMAL LOW (ref 31.5–35.7)
MCV: 90 fL (ref 79–97)
Platelets: 185 10*3/uL (ref 150–450)
RBC: 4.34 x10E6/uL (ref 3.77–5.28)
RDW: 12.7 % (ref 11.7–15.4)
WBC: 6.2 10*3/uL (ref 3.4–10.8)

## 2018-07-06 LAB — COMPREHENSIVE METABOLIC PANEL
ALT: 13 IU/L (ref 0–32)
AST: 18 IU/L (ref 0–40)
Albumin/Globulin Ratio: 1.7 (ref 1.2–2.2)
Albumin: 3.9 g/dL (ref 3.6–4.6)
Alkaline Phosphatase: 88 IU/L (ref 39–117)
BUN/Creatinine Ratio: 22 (ref 12–28)
BUN: 19 mg/dL (ref 8–27)
Bilirubin Total: 0.6 mg/dL (ref 0.0–1.2)
CO2: 25 mmol/L (ref 20–29)
Calcium: 9.8 mg/dL (ref 8.7–10.3)
Chloride: 100 mmol/L (ref 96–106)
Creatinine, Ser: 0.88 mg/dL (ref 0.57–1.00)
GFR calc Af Amer: 69 mL/min/{1.73_m2} (ref >59)
GFR calc non Af Amer: 60 mL/min/{1.73_m2} (ref >59)
Globulin, Total: 2.3 g/dL (ref 1.5–4.5)
Glucose: 112 mg/dL — ABNORMAL HIGH (ref 65–99)
Potassium: 3.8 mmol/L (ref 3.5–5.2)
Sodium: 141 mmol/L (ref 134–144)
Total Protein: 6.2 g/dL (ref 6.0–8.5)

## 2018-07-06 LAB — LIPID PANEL
Chol/HDL Ratio: 3 ratio (ref 0.0–4.4)
Cholesterol, Total: 131 mg/dL (ref 100–199)
HDL: 44 mg/dL (ref >39)
LDL Calculated: 71 mg/dL (ref 0–99)
Triglycerides: 82 mg/dL (ref 0–149)
VLDL Cholesterol Cal: 16 mg/dL (ref 5–40)

## 2018-07-23 ENCOUNTER — Other Ambulatory Visit: Payer: Self-pay

## 2018-07-23 ENCOUNTER — Ambulatory Visit
Admission: RE | Admit: 2018-07-23 | Discharge: 2018-07-23 | Disposition: A | Discharge status: Home / Self Care | Payer: Medicare Other | Source: Ambulatory Visit | Attending: Family Medicine | Admitting: Family Medicine

## 2018-07-23 DIAGNOSIS — Z1231 Encounter for screening mammogram for malignant neoplasm of breast: Secondary | ICD-10-CM | POA: Diagnosis not present

## 2018-07-27 ENCOUNTER — Telehealth: Payer: Self-pay | Admitting: *Deleted

## 2018-07-27 NOTE — Telephone Encounter (Signed)
Error

## 2018-07-27 NOTE — Progress Notes (Signed)
Pt has been made aware of normal result and verbalized understanding.  jw 07/27/2018

## 2018-07-30 ENCOUNTER — Ambulatory Visit: Payer: Medicare Other

## 2018-07-31 ENCOUNTER — Other Ambulatory Visit: Payer: Medicare Other

## 2018-08-04 ENCOUNTER — Other Ambulatory Visit: Payer: Self-pay

## 2018-08-05 MED ORDER — CLOTRIMAZOLE 1 % EX CREA: TOPICAL_CREAM | CUTANEOUS | 0 refills | Status: DC

## 2018-08-11 ENCOUNTER — Ambulatory Visit (INDEPENDENT_AMBULATORY_CARE_PROVIDER_SITE_OTHER): Payer: Medicare Other | Admitting: Family Medicine

## 2018-08-11 ENCOUNTER — Encounter: Payer: Self-pay | Admitting: Family Medicine

## 2018-08-11 ENCOUNTER — Other Ambulatory Visit: Payer: Self-pay

## 2018-08-11 VITALS — BP 148/83 | HR 80 | Temp 98.0°F | Wt 218.0 lb

## 2018-08-11 DIAGNOSIS — R0609 Other forms of dyspnea: Secondary | ICD-10-CM

## 2018-08-11 DIAGNOSIS — M7120 Synovial cyst of popliteal space [Baker], unspecified knee: Secondary | ICD-10-CM | POA: Insufficient documentation

## 2018-08-11 DIAGNOSIS — L259 Unspecified contact dermatitis, unspecified cause: Secondary | ICD-10-CM

## 2018-08-11 DIAGNOSIS — M170 Bilateral primary osteoarthritis of knee: Secondary | ICD-10-CM | POA: Diagnosis not present

## 2018-08-11 MED ORDER — DICLOFENAC SODIUM 1 % TD GEL: 4.0000 g | Freq: Four times a day (QID) | TRANSDERMAL | 3 refills | Status: DC

## 2018-08-11 MED ORDER — TRIAMCINOLONE ACETONIDE 0.1 % EX CREA: 1.0000 "application " | TOPICAL_CREAM | Freq: Two times a day (BID) | CUTANEOUS | 0 refills | Status: DC

## 2018-08-11 NOTE — Progress Notes (Signed)
tr     Subjective: Chief Complaint  Patient presents with  . Knee Pain     HPI: Bianca [name] is a 85 y.o. presenting to clinic today to discuss the following:  1 Rash  Notes that off and on for many years she has had a rash under her breasts.  She has used clotrimazole in the past, but states that recently it has not been helping her.  She used some of her daughters triamcinolone cream for the past week and notes that it greatly improved her rash to the point that it is almost gone.  Denies any fevers or open wounds.  2 Handicap Placcard Renewal Patient presents with placcard renewal form to be filled out.  She notes that she has chronic DOE and cannot walk more than 200 feet without stopping to rest and catch her breath.  She also has bilateral knee osteoarthritis and has difficulty walking because of that.  She has Chronic Diastolic Heart Failure and Atrial Fibrillation that likely contribute to her chronic DOE.  3 B/L Knee Pain Patient has arthritis in both knees and has had bilateral knee arthroscopy in the past.  She has also had injections many years ago that improved.  She notes that she takes tylenol for the pain and was wondering if there is anything else she can do.  She notes that pain worsens at the end of the day and she is very stiff when she is sitting.  Sitting outside in the heat helps as well.  She is unable to use NSAIDs because she is on a blood thinner and at increased bleeding risk.      ROS noted in HPI.   Past Medical, Surgical, Social, and Family History Reviewed & Updated per EMR.   Pertinent Historical Findings include:   Social History   Tobacco Use  Smoking Status Former Smoker  . Packs/day: 0.50  . Years: 10.00  . Pack years: 5.00  . Types: Cigarettes  . Quit date: 08/13/1968  . Years since quitting: 50.0  Smokeless Tobacco Never Used      Objective: BP (!) 148/83   Pulse 80   Temp 98 F (36.7 C) (Oral)   Wt 218 lb (98.9 kg)   SpO2 99%    BMI 35.19 kg/m  Vitals and nursing notes reviewed  Physical Exam:  General: 85 y.o. female in NAD Cardio: irregularly irregular Lungs: CTAB, no wheezing, no rhonchi, no crackles, no IWOB on RA Abdomen: Soft, non-tender to palpation, non-distended, positive bowel sounds Skin: warm and dry, hypopigmentation below bilateral breasts without rashes or lesions, no erythema Extremities: Trace BLE edema, crepitus with bilateral knee ROM and mild pain with ROM throughout, swelling of posterior knee bilaterally with mild TTP of 3cm fluid-filled cyst on left and 2cm fluid-filled cyst on right, no TTP bilateral calves, no erythema   No results found for this or any previous visit (from the past 72 hour(s)).  Assessment/Plan:  DOE (dyspnea on exertion) Patient with chronic dyspnea on exertion and unable to walk 200 feet without stopping to rest.  Handicap placcard form filled out and given to patient.  Contact dermatitis Some hypopigmentation on exam, no rash noted.  Suspect that clotrimazole improved intertrigo and given improvement with triamcinolone, she likely had component of atopic dermatitis as well.  Will send Rx for triamcinolone for use if recurs.  Advised that it should not be more than 2 weeks at a time.  Also advised that if rash recurs, and no  improvement in 2-3 days, she should come in for an appointment.  Osteoarthritis of both knees Discussed with patient options for treatment.  She opts to not have steroid injection at present.  Given her contraindication to oral NSAIDs and lack of improvement with tylenol, will add voltaren gel for pain.  Can consider intra-articular steroid injection in the future.  Synovial cyst of popliteal space Suspect bilateral baker's cysts 2/2 osteoarthritis.  She declines steroid at this time and notes that she would like to start with voltaren gel.  No asymmetrical swelling of lower extremities, no CP, no new SOB, and patient on chronic Eliquis and has  not missed an appointment, therefore doubt DVT.   Advised to follow up in 1-2 months with new PCP.  PATIENT EDUCATION PROVIDED: See AVS    Diagnosis and plan along with any newly prescribed medication(s) were discussed in detail with this patient today. The patient verbalized understanding and agreed with the plan. Patient advised if symptoms worsen return to clinic or ER.     No orders of the defined types were placed in this encounter.   Meds ordered this encounter  Medications  . diclofenac sodium (VOLTAREN) 1 % GEL    Sig: Apply 4 g topically 4 (four) times daily.    Dispense:  150 g    Refill:  3  . triamcinolone cream (KENALOG) 0.1 %    Sig: Apply 1 application topically 2 (two) times daily.    Dispense:  30 g    Refill:  [place], DO 08/11/2018, 12:57 PM PGY-2 [place]

## 2018-08-11 NOTE — Assessment & Plan Note (Signed)
Discussed with patient options for treatment.  She opts to not have steroid injection at present.  Given her contraindication to oral NSAIDs and lack of improvement with tylenol, will add voltaren gel for pain.  Can consider intra-articular steroid injection in the future.

## 2018-08-11 NOTE — Assessment & Plan Note (Signed)
Suspect bilateral baker's cysts 2/2 osteoarthritis.  She declines steroid at this time and notes that she would like to start with voltaren gel.  No asymmetrical swelling of lower extremities, no CP, no new SOB, and patient on chronic Eliquis and has not missed an appointment, therefore doubt DVT.

## 2018-08-11 NOTE — Assessment & Plan Note (Signed)
Some hypopigmentation on exam, no rash noted.  Suspect that clotrimazole improved intertrigo and given improvement with triamcinolone, she likely had component of atopic dermatitis as well.  Will send Rx for triamcinolone for use if recurs.  Advised that it should not be more than 2 weeks at a time.  Also advised that if rash recurs, and no improvement in 2-3 days, she should come in for an appointment.

## 2018-08-11 NOTE — Patient Instructions (Signed)
Thank you for coming to see me today. It was a pleasure. Today we talked about:   Your rash:  I have sent a prescription for the cream you asked for.  If you get the rash again and it's not improving in 2-3 days with the cream, come back for a visit.  Never use the cream for more than two weeks at a time.  Your knee pain: I have sent a prescription for voltaren gel to the pharmacy.  If it is not covered by your insurance you can get it over the counter.  We can consider knee injections in the future if this is not helping.  Please follow-up with your new doctor, Dr. Posey Pronto in 1-2 months.  If you have any questions or concerns, please do not hesitate to call the office at [phone].  Best,   [place] Constable, DO

## 2018-08-11 NOTE — Assessment & Plan Note (Signed)
Patient with chronic dyspnea on exertion and unable to walk 200 feet without stopping to rest.  Handicap placcard form filled out and given to patient.

## 2018-08-28 ENCOUNTER — Other Ambulatory Visit: Payer: Self-pay | Admitting: *Deleted

## 2018-08-28 DIAGNOSIS — I712 Thoracic aortic aneurysm, without rupture, unspecified: Secondary | ICD-10-CM

## 2018-09-16 ENCOUNTER — Other Ambulatory Visit: Payer: Self-pay

## 2018-09-16 MED ORDER — FUROSEMIDE 40 MG PO TABS: ORAL_TABLET | ORAL | 0 refills | Status: DC

## 2018-09-22 DIAGNOSIS — H40023 Open angle with borderline findings, high risk, bilateral: Secondary | ICD-10-CM | POA: Diagnosis not present

## 2018-09-22 DIAGNOSIS — H02831 Dermatochalasis of right upper eyelid: Secondary | ICD-10-CM | POA: Diagnosis not present

## 2018-09-22 DIAGNOSIS — H02834 Dermatochalasis of left upper eyelid: Secondary | ICD-10-CM | POA: Diagnosis not present

## 2018-10-07 ENCOUNTER — Other Ambulatory Visit: Payer: Medicare Other

## 2018-10-07 ENCOUNTER — Ambulatory Visit: Payer: Medicare Other | Admitting: Surgery

## 2018-10-22 NOTE — Progress Notes (Signed)
Cardiology Office Note   Date:  10/23/2018   ID:  Bianca DINALLO, DOB [birth year], MRN [id number]  PCP:  Lattie Haw, MD    No chief complaint on file.  PAF  Wt Readings from Last 3 Encounters:  10/23/18 216 lb (98 kg)  08/11/18 218 lb (98.9 kg)  06/02/18 220 lb (99.8 kg)       History of Present Illness: Bianca [name] is a 85 y.o. female  Who has Permanent atrial fibrillation and chronic diastolic heart failure. Echo in 12/17 showed:  Left ventricle: The cavity size was normal. There was mild concentric hypertrophy. Systolic function was normal. Wall motion was normal; there were no regional wall motion abnormalities. - Aortic valve: There was no regurgitation. - Ascending aorta: The ascending aorta was moderately dilated measuring 47 mm. - Mitral valve: Mildly thickened leaflets . There was mild regurgitation. - Left atrium: The atrium was severely dilated. - Right ventricle: The cavity size was mildly dilated. Wall thickness was normal. Systolic function was mildly reduced. - Right atrium: The atrium was moderately dilated. - Tricuspid valve: There was mild regurgitation. - Pulmonic valve: There was mild regurgitation. - Pulmonary arteries: Systolic pressure was mildly increased. PA peak pressure: 38 mm Hg (S). - Inferior vena cava: The vessel was normal in size.  MRA in 1/18 showed:Aneurysmal dilatation of the ascending thoracic aorta measuring up to 4.7 cm in maximal diameter. The aortic root/sinuses of Valsalva  do not show aneurysmal dilatation.  She has had leg swelling in the past. She had been taking daily Lasix, although it was prescribed twice a day.  In 12/18, she had some chest discomfort, started epigastric and went up into her chest. She had drank some soda and that helped. She ate some sausages that she would not normally eat , that evening. No further since that time.   Since the last visit, she has done well.  Denies :  Chest pain. Dizziness. Nitroglycerin use. Orthopnea. Palpitations. Paroxysmal nocturnal dyspnea. Shortness of breath. Syncope.   Chronic ankle/ leg edema.  In May, BP was checked at home.  Meds were not changed: "Given her advanced age and h/o bradycardia, will hold course for now then especially since she periodically swings down to 114-121."  Past Medical History:  Diagnosis Date  . Bradycardia    a. Holter monitor in the past showed bradycardia/pauses, so she is not on nodal blockers.   . Chronic diastolic heart failure ([place])    a. 12/2015 showed normal EF, mild LVH, 69mmm ascending aortc aneurysm, severe LAE, mildly dilated RV with mildly reduced RVF, mild TR/PR, mildly increased PASP.  Marland Kitchen GERD (gastroesophageal reflux disease)   . Hx of cardiovascular stress test    Lexiscan Myoview (10/14):  Fixed inf defect, no ischemia, study not gated  . Hx of echocardiogram    Echo (11/14):  Mild LVH, EF 60-65%, mod to severe LAE, mod RVE, mild to mod reduced RVSF, mild RAE, PASP 38  . Hypertension   . Long term current use of anticoagulant   . Mild pulmonary hypertension ([place])   . Obesity   . Osteoarthritis   . Permanent atrial fibrillation ([place])   . Refusal of blood transfusions as patient is Jehovah's Witness   . Thoracic ascending aortic aneurysm [facility])     Past Surgical History:  Procedure Laterality Date  . ABDOMINAL HYSTERECTOMY    . CARPAL TUNNEL RELEASE    . CHOLECYSTECTOMY    . IRRIGATION AND DEBRIDEMENT SEBACEOUS  CYST  1980   sebaceous cysts removed from tail bone  . KNEE ARTHROSCOPY Bilateral   . TUBAL LIGATION       Current Outpatient Medications  Medication Sig Dispense Refill  . amLODipine-benazepril (LOTREL) 5-20 MG capsule TAKE 1 CAPSULE BY MOUTH  DAILY 90 capsule 2  . calcium-vitamin D (OSCAL WITH D 500-200) 500-200 MG-UNIT per tablet Take 1 tablet by mouth daily.      . Cholecalciferol (VITAMIN D-3) 1000 UNITS CAPS Take 1,000 Units by mouth daily.     .  clotrimazole (LOTRIMIN) 1 % cream APPLY TO AFFECTED AREA(S)  TOPICALLY TWO TIMES DAILY 30 g 0  . diclofenac sodium (VOLTAREN) 1 % GEL Apply 4 g topically 4 (four) times daily. 150 g 3  . ELIQUIS 5 MG TABS tablet TAKE 1 TABLET BY MOUTH TWO  TIMES DAILY 180 tablet 1  . fish oil-omega-3 fatty acids 1000 MG capsule Take 2 g by mouth daily.    Marland Kitchen FLUAD 0.5 ML SUSY ADM 0.5ML IM UTD    . furosemide (LASIX) 40 MG tablet TAKE (60 MG TOTAL) 1 AND  1/2 TABLETS BY MOUTH DAILY 135 tablet 0  . MAPAP ARTHRITIS PAIN 650 MG CR tablet TAKE 1 TABLET BY MOUTH  EVERY 8 HOURS AS NEEDED FOR PAIN 120 tablet 0  . Multiple Vitamin (MULTIVITAMIN) tablet Take 1 tablet by mouth daily.      . nitroGLYCERIN (NITROSTAT) 0.4 MG SL tablet DISSOLVE 1 TAB UNDER TONGUE EVERY 5 MINS AS NEEDED FOR  CHEST PAIN. NOT TO EXCEED 3 TABS IN 15 MINS. CALL 911  IF CHEST PAIN PERSISTS. 75 tablet 1  . spironolactone (ALDACTONE) 25 MG tablet TAKE ONE-HALF TABLET BY  MOUTH DAILY 45 tablet 3  . triamcinolone cream (KENALOG) 0.1 % Apply 1 application topically 2 (two) times daily. 30 g 0   No current facility-administered medications for this visit.     Allergies:   Hydrocodone, Morphine, and Penicillins    Social History:  The patient  reports that she quit smoking about 50 years ago. Her smoking use included cigarettes. She has a 5.00 pack-year smoking history. She has never used smokeless tobacco. She reports that she does not drink alcohol or use drugs.   Family History:  The patient's *family history includes Asthma in her mother; Breast cancer in her maternal grandmother; Breast cancer (age of onset: 55) in her daughter; Cancer in her daughter, mother, and son; Clotting disorder in her daughter; Colon cancer in her mother; Depression in her daughter, daughter, and daughter; Diabetes in her brother, sister, and another family member; Hypertension in an other family member; Kidney cancer in her son.    ROS:  Please see the history of present  illness.   Otherwise, review of systems are positive for ankle swelling.   All other systems are reviewed and negative.    PHYSICAL EXAM: VS:  BP (!) 148/80   Pulse 79   Ht 5\' 6"  (1.676 m)   Wt 216 lb (98 kg)   SpO2 99%   BMI 34.86 kg/m  , BMI Body mass index is 34.86 kg/m. GEN: Well nourished, well developed, in no acute distress  HEENT: normal  Neck: no JVD, carotid bruits, or masses Cardiac: irregularly irregular; no murmurs, rubs, or gallops,no edema  Respiratory:  clear to auscultation bilaterally, normal work of breathing GI: soft, nontender, nondistended, + BS MS: no deformity or atrophy  Skin: warm and dry, no rash Neuro:  Strength and sensation are intact  Psych: euthymic mood, full affect   EKG:   The ekg ordered today demonstrates AFib, controlled ventricular response; nonspecific ST-T wave changes   Recent Labs: 07/06/2018: ALT 13; BUN 19; Creatinine, Ser 0.88; Hemoglobin 12.2; Platelets 185; Potassium 3.8; Sodium 141   Lipid Panel    Component Value Date/Time   CHOL 131 07/06/2018 0744   TRIG 82 07/06/2018 0744   HDL 44 07/06/2018 0744   CHOLHDL 3.0 07/06/2018 0744   CHOLHDL 3.6 12/21/2014 1523   VLDL 23 12/21/2014 1523   LDLCALC 71 07/06/2018 0744   LDLDIRECT 119 (H) 06/14/2009 1819     Other studies Reviewed: Additional studies/ records that were reviewed today with results demonstrating: 06/2018: TC 131; Cr 0.88.   ASSESSMENT AND PLAN:  1. Chronic diastolic heart failure: Appears euvolemic.  Mild ankle swelling which is chronic.  BP is at the upper end of the acceptable range.  Could consider increasing dose of Lotrel; continue to check BP at home.  If she is consistently over 150/80, would have to increase meds.   2. AFib: No palpitations.   3. THoracic aneurysm: Scheduled for MRA.  4. Anticoagulated: Eliquis for stroke prevention. No bleeding issues. Hbg stable in June 2020.    Current medicines are reviewed at length with the patient today.  The  patient concerns regarding her medicines were addressed.  The following changes have been made:  No change  Labs/ tests ordered today include:  No orders of the defined types were placed in this encounter.   Recommend 150 minutes/week of aerobic exercise Low fat, low carb, high fiber diet recommended  Disposition:   FU in 1 year with PA   Signed, Larae Grooms, MD  10/23/2018 11:43 AM    [facility] [place], [place], [place]  [address] Phone: [phone]; Fax: [phone]

## 2018-10-23 ENCOUNTER — Other Ambulatory Visit: Payer: Self-pay

## 2018-10-23 ENCOUNTER — Ambulatory Visit (INDEPENDENT_AMBULATORY_CARE_PROVIDER_SITE_OTHER): Payer: Medicare Other | Admitting: Interventional Cardiology

## 2018-10-23 ENCOUNTER — Encounter: Payer: Self-pay | Admitting: Interventional Cardiology

## 2018-10-23 VITALS — BP 148/80 | HR 79 | Ht 66.0 in | Wt 216.0 lb

## 2018-10-23 DIAGNOSIS — I5032 Chronic diastolic (congestive) heart failure: Secondary | ICD-10-CM | POA: Diagnosis not present

## 2018-10-23 DIAGNOSIS — Z7901 Long term (current) use of anticoagulants: Secondary | ICD-10-CM | POA: Diagnosis not present

## 2018-10-23 DIAGNOSIS — I712 Thoracic aortic aneurysm, without rupture, unspecified: Secondary | ICD-10-CM

## 2018-10-23 DIAGNOSIS — I4819 Other persistent atrial fibrillation: Secondary | ICD-10-CM | POA: Diagnosis not present

## 2018-10-23 NOTE — Patient Instructions (Signed)
Medication Instructions:  Your physician recommends that you continue on your current medications as directed. Please refer to the Current Medication list given to you today.  If you need a refill on your cardiac medications before your next appointment, please call your pharmacy.   Lab work: None Ordered  If you have labs (blood work) drawn today and your tests are completely normal, you will receive your results only by: Marland Kitchen MyChart Message (if you have MyChart) OR . A paper copy in the mail If you have any lab test that is abnormal or we need to change your treatment, we will call you to review the results.  Testing/Procedures: None ordered  Follow-Up: At [facility], you and your health needs are our priority.  As part of our continuing mission to provide you with exceptional heart care, we have created designated Provider Care Teams.  These Care Teams include your primary Cardiologist (physician) and Advanced Practice Providers (APPs -  Physician Assistants and Nurse Practitioners) who all work together to provide you with the care you need, when you need it. . You will need a follow up appointment in 1 year.  Please call our office 2 months in advance to schedule this appointment.  You may see Casandra Doffing, MD or one of the following Advanced Practice Providers on your designated Care Team:   . Lyda Jester, PA-C . Dayna Dunn, PA-C . Ermalinda Barrios, PA-C  Any Other Special Instructions Will Be Listed Below (If Applicable).   Your physician has requested that you regularly monitor and record your blood pressure readings at home. Please use the same machine at the same time of day to check your readings and record them. Please call in a couple of weeks to report your readings.

## 2018-10-29 ENCOUNTER — Other Ambulatory Visit: Payer: Self-pay

## 2018-10-29 ENCOUNTER — Ambulatory Visit
Admission: RE | Admit: 2018-10-29 | Discharge: 2018-10-29 | Disposition: A | Discharge status: Home / Self Care | Payer: Medicare Other | Source: Ambulatory Visit | Attending: Surgery | Admitting: Surgery

## 2018-10-29 DIAGNOSIS — I712 Thoracic aortic aneurysm, without rupture, unspecified: Secondary | ICD-10-CM

## 2018-10-29 DIAGNOSIS — I700 Atherosclerosis of aorta: Secondary | ICD-10-CM | POA: Diagnosis not present

## 2018-10-29 MED ORDER — GADOBENATE DIMEGLUMINE 529 MG/ML IV SOLN
20.0000 mL | Freq: Once | INTRAVENOUS | Status: AC | PRN
  Administered 2018-10-29: 20 mL via INTRAVENOUS

## 2018-11-04 ENCOUNTER — Encounter: Payer: Self-pay | Admitting: Surgery

## 2018-11-04 ENCOUNTER — Ambulatory Visit (INDEPENDENT_AMBULATORY_CARE_PROVIDER_SITE_OTHER): Payer: Medicare Other | Admitting: Surgery

## 2018-11-04 ENCOUNTER — Other Ambulatory Visit: Payer: Self-pay

## 2018-11-04 VITALS — BP 152/80 | HR 95 | Temp 97.7°F | Resp 16 | Ht 66.0 in | Wt 216.0 lb

## 2018-11-04 DIAGNOSIS — I712 Thoracic aortic aneurysm, without rupture, unspecified: Secondary | ICD-10-CM

## 2018-11-04 NOTE — Progress Notes (Signed)
HPI:  The patient is an 85 year old [place] with a history of hypertension and persistent atrial fibrillation on Eliquis who returns for follow-up of a 4.6 cm fusiform ascending aortic aneurysm.  This was diagnosed in 12/2015 by echocardiogram.  I saw her 1 year ago and it was measured at 4.7 cm on MRA.  She had a follow-up MRA in January 2020 and it was measured at 4.6 cm.  She continues to feel well overall without chest or back pain.  She has been checking her blood pressure at home.  Current Outpatient Medications  Medication Sig Dispense Refill  . amLODipine-benazepril (LOTREL) 5-20 MG capsule TAKE 1 CAPSULE BY MOUTH  DAILY 90 capsule 2  . calcium-vitamin D (OSCAL WITH D 500-200) 500-200 MG-UNIT per tablet Take 1 tablet by mouth daily.      . Cholecalciferol (VITAMIN D-3) 1000 UNITS CAPS Take 1,000 Units by mouth daily.     . clotrimazole (LOTRIMIN) 1 % cream APPLY TO AFFECTED AREA(S)  TOPICALLY TWO TIMES DAILY 30 g 0  . diclofenac sodium (VOLTAREN) 1 % GEL Apply 4 g topically 4 (four) times daily. 150 g 3  . ELIQUIS 5 MG TABS tablet TAKE 1 TABLET BY MOUTH TWO  TIMES DAILY 180 tablet 1  . fish oil-omega-3 fatty acids 1000 MG capsule Take 2 g by mouth daily.    Marland Kitchen FLUAD 0.5 ML SUSY ADM 0.5ML IM UTD    . furosemide (LASIX) 40 MG tablet TAKE (60 MG TOTAL) 1 AND  1/2 TABLETS BY MOUTH DAILY 135 tablet 0  . MAPAP ARTHRITIS PAIN 650 MG CR tablet TAKE 1 TABLET BY MOUTH  EVERY 8 HOURS AS NEEDED FOR PAIN 120 tablet 0  . Multiple Vitamin (MULTIVITAMIN) tablet Take 1 tablet by mouth daily.      . nitroGLYCERIN (NITROSTAT) 0.4 MG SL tablet DISSOLVE 1 TAB UNDER TONGUE EVERY 5 MINS AS NEEDED FOR  CHEST PAIN. NOT TO EXCEED 3 TABS IN 15 MINS. CALL 911  IF CHEST PAIN PERSISTS. 75 tablet 1  . spironolactone (ALDACTONE) 25 MG tablet TAKE ONE-HALF TABLET BY  MOUTH DAILY 45 tablet 3  . triamcinolone cream (KENALOG) 0.1 % Apply 1 application topically 2 (two) times daily. 30 g 0   No current  facility-administered medications for this visit.      Physical Exam: BP (!) 152/80 (BP Location: Left Arm, Patient Position: Sitting, Cuff Size: Large)   Pulse 95   Temp 97.7 F (36.5 C)   Resp 16   Ht 5\' 6"  (1.676 m)   Wt 216 lb (98 kg)   SpO2 98% Comment: RA  BMI 34.86 kg/m  She looks well. Cardiac exam shows a regular rate and rhythm with normal heart sounds.  There is no murmur. Lungs are clear.  Diagnostic Tests:  CLINICAL DATA:  85 year old female with a history of aortic aneurysm  EXAM: MRA CHEST WITHOUT AND WITH CONTRAST  TECHNIQUE: Angiographic images of the chest were obtained using MRA technique without and with intravenous contrast.  CONTRAST:  55mL MULTIHANCE GADOBENATE DIMEGLUMINE 529 MG/ML IV SOLN  COMPARISON:  None.  FINDINGS: VASCULAR  Aorta: Similar configuration of the thoracic aorta, with tortuosity.  Estimated diameter of the annulus 31 mm.  Greatest diameter of the ascending aorta, approximately 46 mm.  No dissection or periaortic fluid. Minimal atherosclerotic changes of the descending thoracic aorta.  Three vessel arch, type 1 arch, with patency of the branch vessels.  Heart: Cardiomegaly again noted.  No pericardial  fluid/thickening.  Pulmonary Arteries: Main pulmonary artery measures 4.3 cm, which, not significantly changed.  Other: None  NON-VASCULAR  Chest wall:  Susceptibility artifact in the bilateral supraclavicular region compatible with surgical clips.  Lungs:  Unremarkable appearance of the lungs  Mediastinum: Unremarkable mediastinal structures.  Similar appearance of the thyroid with nonspecific nodularity.  Musculoskeletal: Similar appearance of mild kyphotic curvature of the thoracic spine with degenerative changes. No focal signal abnormality.  IMPRESSION: Redemonstration of ascending aortic aneurysm measuring 4.6 cm, not changed from the comparison studies. Aortic aneurysm NOS  (ICD10-I71.9). Aortic Atherosclerosis (ICD10-I70.0).  Redemonstration of enlargement of the main pulmonary artery, not changed from the comparison.  Signed,  Dulcy Fanny. Dellia Nims, RPVI  [facility]  [facility]   Electronically Signed   By: Corrie Mckusick D.O.   On: 10/29/2018 10:31   Impression:  She has a stable 4.6 cm fusiform ascending aortic aneurysm which is unchanged since it was diagnosed on echocardiogram in 12/2015.  This is well below the surgical threshold of 5.5 cm.  I reviewed the MRI images with her and her daughter and answered all of their questions.  I stressed the importance of continued good blood pressure control in preventing further enlargement and acute aortic dissection.  Plan:  She will return to see me in 1 year MRA of the chest.  I spent 15 minutes performing this established patient evaluation and > 50% of this time was spent face to face counseling and coordinating the care of this patient's aortic aneurysm.    Gaye Pollack, MD [facility] [phone]

## 2018-12-09 ENCOUNTER — Other Ambulatory Visit: Payer: Self-pay | Admitting: Physician Assistant

## 2018-12-09 ENCOUNTER — Other Ambulatory Visit: Payer: Self-pay | Admitting: Family Medicine

## 2018-12-09 NOTE — Telephone Encounter (Signed)
Age 85, weight 98kg, SCr 0.88 on 07/06/18. Last OV Oct 2020, afib indication

## 2019-01-05 ENCOUNTER — Other Ambulatory Visit: Payer: Self-pay

## 2019-01-05 ENCOUNTER — Encounter: Payer: Self-pay | Admitting: Family Medicine

## 2019-01-05 ENCOUNTER — Ambulatory Visit (INDEPENDENT_AMBULATORY_CARE_PROVIDER_SITE_OTHER): Payer: Medicare Other | Admitting: Family Medicine

## 2019-01-05 VITALS — BP 134/82 | HR 78 | Wt 217.0 lb

## 2019-01-05 DIAGNOSIS — L219 Seborrheic dermatitis, unspecified: Secondary | ICD-10-CM

## 2019-01-05 DIAGNOSIS — Z131 Encounter for screening for diabetes mellitus: Secondary | ICD-10-CM

## 2019-01-05 DIAGNOSIS — L304 Erythema intertrigo: Secondary | ICD-10-CM

## 2019-01-05 LAB — POCT GLYCOSYLATED HEMOGLOBIN (HGB A1C): Hemoglobin A1C: 5.3 % (ref 4.0–5.6)

## 2019-01-05 NOTE — Patient Instructions (Signed)
Hi Ms Zettlemoyer,   It was lovely to meet you today!!Today we discussed the areas which were itching on your body. I think you may have a fungal infection in these areas. For your face I recommend Lotrimin cream/power twice daily. You can also put dandruff shampoo on the area near your nose. Under your breast and groin area you can use Zeasorb power. Please keep these areas nice and dry. Avoid wired bras. Avoid fragranced cleansers/perfumes etc on the skin. Your screen for diabetes was normal! Keep up the great work!!  Please come and see me in a months time if your symptoms are not improving.  Best wishes,  Dr Lattie Haw

## 2019-01-05 NOTE — Progress Notes (Signed)
   Subjective:    Patient ID: Bianca [name], female    DOB: July 23, 1930, 85 y.o.   MRN: [id number]   CC: Bianca [name] is an 85 yr old female who presents today for a check up   HPI:  Rash Pt reports having this rash for1-2 years. It is primarily located in the groin area, under the breast folds and around the nasolabial region. It is very itchy,tender and erythematous. Does not bleed. Was seen at [facility] in July and was thought to have contact dermatitis. Has been using Clotrimazole 1% and triamcinolone cream 0.1% BID which has helped a lot. Pt is concerned that the Eliquis she started a few years ago is causing the rash.  Diabetic check  Concerned about developing DM. Wants HbA1c today.  Smoking status reviewed   ROS: pertinent noted in the HPI    Past medical history, surgical, family, and social history reviewed and updated in the EMR as appropriate. Reviewed problem list.   Objective:  BP 134/82   Pulse 78   Wt 217 lb (98.4 kg)   SpO2 97%   BMI 35.02 kg/m   Vitals and nursing note reviewed  General: NAD, pleasant, able to participate in exam Cardiac: RRR, S1 S2 present. normal heart sounds, no murmurs. Respiratory: CTAB, normal effort, No wheezes, rales or rhonchi Extremities: no edema or cyanosis. Skin: warm and dry, no rashes noted Neuro: alert, no obvious focal deficits Psych: Normal affect and mood Media Information   Document Information  Photos    01/05/2019 10:47  Attached To:  Office Visit on 01/05/19 with Lattie Haw, MD  Source Information  Lattie Haw, MD  [facility]   Media Information   Document Information  Photos    01/05/2019 10:47  Attached To:  Office Visit on 01/05/19 with Lattie Haw, MD  Source Information  Lattie Haw, MD  [facility] Resident     Assessment & Plan:    Seborrheic dermatitis Likely Seborrheic dermatitis over nasolabial folds. Recommended Lotrimin on the area and dandruff shampoo to seep into  the affected area which has antifungal properties.  Intertrigo Likely intertrigo under breast folds and groin area. Recommended Zeasorb powder to affected areas and advised to keep areas as dry as possible. Also recommend against wearing underwired bra which can worsen symptoms.  Diabetes mellitus screening HbA1c 5.3. Does not meet criteria for Diabetes. Provided reassurance to pt.   Lattie Haw, MD  [facility] PGY-1

## 2019-01-08 DIAGNOSIS — L219 Seborrheic dermatitis, unspecified: Secondary | ICD-10-CM | POA: Insufficient documentation

## 2019-01-08 NOTE — Assessment & Plan Note (Signed)
HbA1c 5.3. Does not meet criteria for Diabetes. Provided reassurance to pt.

## 2019-01-08 NOTE — Assessment & Plan Note (Signed)
Likely Seborrheic dermatitis over nasolabial folds. Recommended Lotrimin on the area and dandruff shampoo to seep into the affected area which has antifungal properties.

## 2019-01-08 NOTE — Assessment & Plan Note (Signed)
Likely intertrigo under breast folds and groin area. Recommended Zeasorb powder to affected areas and advised to keep areas as dry as possible. Also recommend against wearing underwired bra which can worsen symptoms.

## 2019-02-02 ENCOUNTER — Telehealth: Payer: Self-pay | Admitting: Interventional Cardiology

## 2019-02-02 NOTE — Telephone Encounter (Signed)
New Message  We are recommending the COVID-19 vaccine to all of our patients. Cardiac medications (including blood thinners) should not deter anyone from being vaccinated and there is no need to hold any of those medications prior to vaccine administration.     Currently, there is a hotline to call (active 01/22/19) to schedule vaccination appointments as no walk-ins will be accepted.   Number: [phone].    If an appointment is not available please go to [email or web address] to sign up for notification when additional vaccine appointments are available.   If you have further questions or concerns about the vaccine process, please visit [email or web address] or contact your primary care physician.   

## 2019-03-10 ENCOUNTER — Other Ambulatory Visit: Payer: Self-pay | Admitting: *Deleted

## 2019-03-11 MED ORDER — AMLODIPINE BESY-BENAZEPRIL HCL 5-20 MG PO CAPS: 1.0000 | ORAL_CAPSULE | Freq: Every day | ORAL | 2 refills | Status: DC

## 2019-03-15 ENCOUNTER — Telehealth: Payer: Self-pay

## 2019-03-15 NOTE — Telephone Encounter (Signed)
Refill request received for the following medication. I did not see this on pt med list.   Lotrel Cap 5-20mg  Take 1 capsule by mouth daily. Qty: 90  Please advise. Ottis Stain, CMA

## 2019-03-16 ENCOUNTER — Other Ambulatory Visit: Payer: Self-pay | Admitting: Family Medicine

## 2019-03-16 ENCOUNTER — Encounter: Payer: Self-pay | Admitting: Family Medicine

## 2019-03-16 ENCOUNTER — Other Ambulatory Visit: Payer: Self-pay

## 2019-03-16 ENCOUNTER — Ambulatory Visit (INDEPENDENT_AMBULATORY_CARE_PROVIDER_SITE_OTHER): Payer: Medicare Other | Admitting: Family Medicine

## 2019-03-16 DIAGNOSIS — G479 Sleep disorder, unspecified: Secondary | ICD-10-CM

## 2019-03-16 DIAGNOSIS — L304 Erythema intertrigo: Secondary | ICD-10-CM | POA: Diagnosis not present

## 2019-03-16 MED ORDER — NYSTATIN-TRIAMCINOLONE 100000-0.1 UNIT/GM-% EX CREA: TOPICAL_CREAM | Freq: Two times a day (BID) | CUTANEOUS | Status: DC

## 2019-03-16 NOTE — Patient Instructions (Addendum)
Ms Newnham,  It was lovely to see you again today!! I am glad that the fungal rash is improved a little bit. I have prescribed you a different which you can try, it is a combination of an antifungal and a steroid to get that.  Please apply this to the affected areas twice daily.  Please continue to stay on top of your rash, keep the areas nice and dry and continue to wear the bra without an underwire.  Appointment with the dermatology clinic at our family medicine office for later this month  Please continue the melatonin you are taking at home regularly. Please follow-up with me in 1 to 2 weeks and we can discuss this further if you still have trouble sleeping.  Best wishes,   Dr Posey Pronto

## 2019-03-16 NOTE — Telephone Encounter (Signed)
Per Cardiologists note in 9/10 she has been taking it so I approved this request when it came to my inbox.

## 2019-03-16 NOTE — Progress Notes (Signed)
    SUBJECTIVE:   CHIEF COMPLAINT / HPI: Bianca [name] is an 86 yr old female who presents today for follow up of skin rash  Intertrigo Patient seen by myself in clinic in December 2020 months ago for recurrent intertrigo under breasts and underneath pannus. Has tried Zeasorb powder for the last month and another over-the-counter cream which her daughter gave her (does not remember name but could be fluconazole). Intertrigo has improved under both breasts but still has some under the left breast at times.  Has been keeping it clean and dry regularly but the itching is still bothering her.  Has also been wearing nonunderwire bras to prevent further irritation of the skin.  Poor sleep Patient reports feeling extremely tired all the time. She goes to bed around 10:30 PM and sometimes lays awake in her bed watching TV, usually does not fall asleep until 2 AM.  Will then wake up around 4-5am, she has friends who usually call her early in the morning and wake her up. Denies drinking caffeinated drinks throughout the day.  Sometimes takes 1 tablet of melatonin sometimes before sleeping which helps a little. Denies low mood, anhedonia, anorexia, suicidal ideation or thoughts of self-harm or harm to others.  PERTINENT  PMH / PSH:  HTN, Afib, intertrigo   OBJECTIVE:   BP 140/84   Pulse 70   Wt 213 lb 3.2 oz (96.7 kg)   SpO2 100%   BMI 34.41 kg/m   General: Alert, cooperative, no acute distress.   Pulm: Speaking in full sentences normal respiratory effort    Intertrigo under breast.  ASSESSMENT/PLAN:   Trouble in sleeping Sleep trouble secondary to poor sleep hygiene, possibly exacerbated by stress. Depression unlikely, PHQ 6 today. Counseled patient on sleep hygiene i.e. avoiding caffeinated drinks after noon, avoiding TV/computer screens/phone screens 1 hour before bedtime, keeping the bedroom for sleeping only etc. Recommended patient takes 2 tablets of melatonin every night a few hours before  bed, follow-up with me in a few weeks.  Also recommended aromatherapy and meditation for relaxation.  Intertrigo Slight improvement in intertrigo today under the breasts.  Still present under the pannus. Patient would like to try another medication today and and be referred to a dermatology clinic -Nystatin-triamcinolone cream sent to the pharmacy -Referred to [facility] dermatology clinic for recurrent intertrigo -Recommended that patient must stay on top of treating the intertrigo and keep it dry and as clean as possible to avoid recurrence, especially as warmer days are coming in the summer which may worsen the intertrigo.      Lattie Haw, MD [place]

## 2019-03-18 ENCOUNTER — Other Ambulatory Visit: Payer: Self-pay | Admitting: Family Medicine

## 2019-03-18 ENCOUNTER — Telehealth: Payer: Self-pay

## 2019-03-18 DIAGNOSIS — G479 Sleep disorder, unspecified: Secondary | ICD-10-CM | POA: Insufficient documentation

## 2019-03-18 MED ORDER — NYSTATIN-TRIAMCINOLONE 100000-0.1 UNIT/GM-% EX CREA: TOPICAL_CREAM | CUTANEOUS | 0 refills | Status: DC

## 2019-03-18 NOTE — Telephone Encounter (Signed)
Pt informed.Aaron Bostwick Zimmerman Rumple, CMA ° °

## 2019-03-18 NOTE — Telephone Encounter (Signed)
Patient calls nurse line regarding mycolog cream not being sent into pharmacy. Medication was ordered as clinic administered medication, so it was never received by pharmacy. Resent medication to pharmacy.   To PCP  Talbot Grumbling, RN

## 2019-03-18 NOTE — Assessment & Plan Note (Signed)
Slight improvement in intertrigo today under the breasts.  Still present under the pannus. Patient would like to try another medication today and and be referred to a dermatology clinic -Nystatin-triamcinolone cream sent to the pharmacy -Referred to [facility] dermatology clinic for recurrent intertrigo -Recommended that patient must stay on top of treating the intertrigo and keep it dry and as clean as possible to avoid recurrence, especially as warmer days are coming in the summer which may worsen the intertrigo.

## 2019-03-18 NOTE — Assessment & Plan Note (Signed)
Sleep trouble secondary to poor sleep hygiene, possibly exacerbated by stress. Depression unlikely, PHQ 6 today. Counseled patient on sleep hygiene i.e. avoiding caffeinated drinks after noon, avoiding TV/computer screens/phone screens 1 hour before bedtime, keeping the bedroom for sleeping only etc. Recommended patient takes 2 tablets of melatonin every night a few hours before bed, follow-up with me in a few weeks.  Also recommended aromatherapy and meditation for relaxation.

## 2019-03-18 NOTE — Telephone Encounter (Signed)
Contacted pharmacy. The  medication I prescribed is not available under her insurance. Have changed it to a nystatin powder. This is now ready to collect.

## 2019-03-25 DIAGNOSIS — Z961 Presence of intraocular lens: Secondary | ICD-10-CM | POA: Diagnosis not present

## 2019-03-25 DIAGNOSIS — H353131 Nonexudative age-related macular degeneration, bilateral, early dry stage: Secondary | ICD-10-CM | POA: Diagnosis not present

## 2019-03-25 DIAGNOSIS — H04123 Dry eye syndrome of bilateral lacrimal glands: Secondary | ICD-10-CM | POA: Diagnosis not present

## 2019-03-25 DIAGNOSIS — H40023 Open angle with borderline findings, high risk, bilateral: Secondary | ICD-10-CM | POA: Diagnosis not present

## 2019-03-31 ENCOUNTER — Telehealth (INDEPENDENT_AMBULATORY_CARE_PROVIDER_SITE_OTHER): Payer: Medicare Other | Admitting: Family Medicine

## 2019-03-31 ENCOUNTER — Other Ambulatory Visit: Payer: Self-pay

## 2019-03-31 DIAGNOSIS — G479 Sleep disorder, unspecified: Secondary | ICD-10-CM

## 2019-03-31 MED ORDER — TRAZODONE HCL 50 MG PO TABS: 25.0000 mg | ORAL_TABLET | Freq: Every day | ORAL | 0 refills | Status: DC

## 2019-03-31 NOTE — Progress Notes (Signed)
[place] Telemedicine Visit  Patient consented to have virtual visit. Method of visit: telephone   Encounter participants: Patient: Bianca [name] - located at home Provider: Lattie Haw - located at [facility]   Chief Complaint: Poor sleep   HPI:  Trouble sleeping Patient is sleeping "about the same" as last visit. Has been taking 1-2 Melatonin pills a night and "they don't help". Falls asleep 1-2am and wakes up 4-5am. Does not feel tired when she wakes up. Denies drinking caffienated drinks after afternoon. Does watch TV in bed. Denies waking up in the night due to pain or diuretic effect. Denies houghts of suicide, self harm or harm to others. Is eating well and denies feeling low. Energy is "alright".  ROS: per HPI  Pertinent PMHx: HTN, Afib, OA, intertrigo   Exam:  Respiratory: Speaking in full sentences  Assessment/Plan:  Trouble in sleeping Trouble sleeping likely 2/2 on going poor sleep hygiene. Reiterated good sleep hygiene like at previous visit. Pt would like non-addictive medication to help her sleep. Recommended that patient tries Trazadone 25mg  once daily. Follow up with me in the next month.    I provided 20 minutes of non-face-to-face time during this encounter.   Lattie Haw, MD

## 2019-04-03 NOTE — Assessment & Plan Note (Addendum)
Trouble sleeping likely 2/2 on going poor sleep hygiene. Reiterated good sleep hygiene like at previous visit. Pt would like non-addictive medication to help her sleep. Recommended that patient tries Trazadone 25mg  once daily. Follow up with me in the next month.

## 2019-04-08 ENCOUNTER — Other Ambulatory Visit: Payer: Self-pay

## 2019-04-08 ENCOUNTER — Ambulatory Visit (INDEPENDENT_AMBULATORY_CARE_PROVIDER_SITE_OTHER): Payer: Medicare Other | Admitting: Family Medicine

## 2019-04-08 DIAGNOSIS — L219 Seborrheic dermatitis, unspecified: Secondary | ICD-10-CM

## 2019-04-08 DIAGNOSIS — L304 Erythema intertrigo: Secondary | ICD-10-CM | POA: Diagnosis not present

## 2019-04-08 MED ORDER — HYDROCORTISONE 1 % EX CREA: 1.0000 "application " | TOPICAL_CREAM | Freq: Two times a day (BID) | CUTANEOUS | 0 refills | Status: DC

## 2019-04-08 MED ORDER — TERBINAFINE HCL 1 % EX CREA: 1.0000 "application " | TOPICAL_CREAM | Freq: Two times a day (BID) | CUTANEOUS | 0 refills | Status: DC

## 2019-04-08 NOTE — Assessment & Plan Note (Signed)
Area under breasts and pannus appears to be intertrigo.  Advised to discontinue all her other products and to use Lamisil.  Advised to air out the area as much as possible.  Advised the area dry.  Can use washcloth to keep the area dry.  Follow-up if no improvement.

## 2019-04-08 NOTE — Assessment & Plan Note (Signed)
Area of nasolabial fold likely seborrheic dermatitis.  Advised to use 1% hydrocortisone cream.  Follow-up if no improvement.

## 2019-04-08 NOTE — Progress Notes (Signed)
    SUBJECTIVE:   CHIEF COMPLAINT / HPI:   Rash Patient presenting with rash both under her breast and in her face. Reports rash has been present x3-4 years. Reports area of rash under her breast and stomach is itchy. Area on face itches and also scales. Denies any drainage. Denies fevers. Reports daughter has a similar rash but does not live with her. Has tried several treatments including lotrimin, benadryl crea, miconazole, mycolog, triamcinalone. No change in cosmetic products. Was wondering if this had to do with her eliquis she states rash started coming up around the time she switched to Eliquis.  PERTINENT  PMH / PSH: Thoracic aortic aneurysm, hypertension, A. fib  OBJECTIVE:   BP (!) 142/80   Pulse 87   Wt 218 lb 3.2 oz (99 kg)   SpO2 98%   BMI 35.22 kg/m   Skin: Rash present under breast folds as well as under pannus.  Area was hyperpigmented.  No rash present on face.  ASSESSMENT/PLAN:   Intertrigo Area under breasts and pannus appears to be intertrigo.  Advised to discontinue all her other products and to use Lamisil.  Advised to air out the area as much as possible.  Advised the area dry.  Can use washcloth to keep the area dry.  Follow-up if no improvement.  Seborrheic dermatitis Area of nasolabial fold likely seborrheic dermatitis.  Advised to use 1% hydrocortisone cream.  Follow-up if no improvement.     Caroline More, [place]

## 2019-04-08 NOTE — Patient Instructions (Addendum)
Intertrigo Intertrigo is skin irritation (inflammation) that happens in warm, moist areas of the body. The irritation can cause a rash and make skin raw and itchy. The rash is usually pink or red. It happens mostly between folds of skin or where skin rubs together, such as:  Between the toes.  In the armpits.  In the groin area.  Under the belly.  Under the breasts.  Around the butt area. This condition is not passed from person to person (is not contagious). What are the causes?  Heat, moisture, rubbing, and not enough air movement.  The condition can be made worse by: ? Sweat. ? Bacteria. ? A fungus, such as yeast. What increases the risk?  Moisture in your skin folds.  You are more likely to develop this condition if you: ? Have diabetes. ? Are overweight. ? Are not able to move around. ? Live in a warm and moist climate. ? Wear splints, braces, or other medical devices. ? Are not able to control your pee (urine) or poop (stool). What are the signs or symptoms?  A pink or red skin rash in the skin fold or near the skin fold.  Raw or scaly skin.  Itching.  A burning feeling.  Bleeding.  Leaking fluid.  A bad smell. How is this treated?  Cleaning and drying your skin.  Taking an antibiotic medicine or using an antibiotic skin cream for a bacterial infection.  Using an antifungal cream on your skin or taking pills for an infection that was caused by a fungus, such as yeast.  Using a steroid ointment to stop the itching and irritation.  Separating the skin fold with a clean cotton cloth to absorb moisture and allow air to flow into the area. Follow these instructions at home:  Keep the affected area clean and dry.  Do not scratch your skin.  Stay cool as much as you can. Use an air conditioner or a fan, if you have one.  Apply over-the-counter and prescription medicines only as told by your doctor.  If you were prescribed an antibiotic medicine,  use it as told by your doctor. Do not stop using the antibiotic even if your condition starts to get better.  Keep all follow-up visits as told by your doctor. This is important. How is this prevented?   Stay at a healthy weight.  Take care of your feet. This is very important if you have diabetes. You should: ? Wear shoes that fit well. ? Keep your feet dry. ? Wear clean cotton or wool socks.  Protect the skin in your groin and butt area as told by your doctor. To do this: ? Follow a regular cleaning routine. ? Use creams, powders, or ointments that protect your skin. ? Change protection pads often.  Do not wear tight clothes. Wear clothes that: ? Are loose. ? Take moisture away from your body. ? Are made of cotton.  Wear a bra that gives good support, if needed.  Shower and dry yourself well after being active. Use a hair dryer on a cool setting to dry between skin folds.  Keep your blood sugar under control if you have diabetes. Contact a doctor if:  Your symptoms do not get better with treatment.  Your symptoms get worse or they spread.  You notice more redness and warmth.  You have a fever. Summary  Intertrigo is skin irritation that occurs when folds of skin rub together.  This condition is caused by heat, moisture,   and rubbing.  This condition may be treated by cleaning and drying your skin and with medicines.  Apply over-the-counter and prescription medicines only as told by your doctor.  Keep all follow-up visits as told by your doctor. This is important. This information is not intended to replace advice given to you by your health care provider. Make sure you discuss any questions you have with your health care provider. Document Revised: 10/09/2017 Document Reviewed: 10/09/2017 Elsevier Patient Education  2020 [place].   Seborrheic Dermatitis, Adult Seborrheic dermatitis is a skin disease that causes red, scaly patches. It usually occurs on the  scalp, and it is often called dandruff. The patches may appear on other parts of the body. Skin patches tend to appear where there are many oil glands in the skin. Areas of the body that are commonly affected include:  Scalp.  Skin folds of the body.  Ears.  Eyebrows.  Neck.  Face.  Armpits.  The bearded area of men's faces. The condition may come and go for no known reason, and it is often long-lasting (chronic). What are the causes? The cause of this condition is not known. What increases the risk? This condition is more likely to develop in people who:  Have certain conditions, such as: ? HIV (human immunodeficiency virus). ? AIDS (acquired immunodeficiency syndrome). ? Parkinson disease. ? Mood disorders, such as depression.  Are 37-17 years old. What are the signs or symptoms? Symptoms of this condition include:  Thick scales on the scalp.  Redness on the face or in the armpits.  Skin that is flaky. The flakes may be white or yellow.  Skin that seems oily or dry but is not helped with moisturizers.  Itching or burning in the affected areas. How is this diagnosed? This condition is diagnosed with a medical history and physical exam. A sample of your skin may be tested (skin biopsy). You may need to see a skin specialist (dermatologist). How is this treated? There is no cure for this condition, but treatment can help to manage the symptoms. You may get treatment to remove scales, lower the risk of skin infection, and reduce swelling or itching. Treatment may include:  Creams that reduce swelling and irritation (steroids).  Creams that reduce skin yeast.  Medicated shampoo, soaps, moisturizing creams, or ointments.  Medicated moisturizing creams or ointments. Follow these instructions at home:  Apply over-the-counter and prescription medicines only as told by your health care provider.  Use any medicated shampoo, soaps, skin creams, or ointments only as  told by your health care provider.  Keep all follow-up visits as told by your health care provider. This is important. Contact a health care provider if:  Your symptoms do not improve with treatment.  Your symptoms get worse.  You have new symptoms. This information is not intended to replace advice given to you by your health care provider. Make sure you discuss any questions you have with your health care provider. Document Revised: 12/13/2016 Document Reviewed: 04/20/2015 Elsevier Patient Education  [place].    For your face:  We think you have seborrheic dermatitis. Please use 1% hydrocortisone cream on the area.   For your body rash: We think it is intertrigo. Stop using all your creams. Use lamisil cream  1. Keep the area dry 2. Try airing it out as much as possible  3. You can use a cool wash cloth to dry it as well

## 2019-05-20 DIAGNOSIS — B351 Tinea unguium: Secondary | ICD-10-CM | POA: Diagnosis not present

## 2019-05-20 DIAGNOSIS — M2041 Other hammer toe(s) (acquired), right foot: Secondary | ICD-10-CM | POA: Diagnosis not present

## 2019-05-25 ENCOUNTER — Other Ambulatory Visit: Payer: Self-pay | Admitting: Physician Assistant

## 2019-05-25 ENCOUNTER — Other Ambulatory Visit: Payer: Self-pay | Admitting: Interventional Cardiology

## 2019-05-25 DIAGNOSIS — I5032 Chronic diastolic (congestive) heart failure: Secondary | ICD-10-CM

## 2019-05-25 DIAGNOSIS — I4819 Other persistent atrial fibrillation: Secondary | ICD-10-CM

## 2019-05-25 DIAGNOSIS — R0602 Shortness of breath: Secondary | ICD-10-CM

## 2019-05-25 DIAGNOSIS — I10 Essential (primary) hypertension: Secondary | ICD-10-CM

## 2019-05-25 NOTE — Telephone Encounter (Signed)
Pt last saw Dr Irish Lack 10/23/18, last labs 07/06/18 Creat 0.88, age 86, weight 99kg, based on specified criteria pt is on appropriate dosage of Eliquis 5mg  BID.  Will refill rx.

## 2019-05-28 ENCOUNTER — Other Ambulatory Visit: Payer: Self-pay | Admitting: Family Medicine

## 2019-06-09 ENCOUNTER — Other Ambulatory Visit: Payer: Self-pay

## 2019-06-09 DIAGNOSIS — R0789 Other chest pain: Secondary | ICD-10-CM

## 2019-06-09 MED ORDER — NITROGLYCERIN 0.4 MG SL SUBL: SUBLINGUAL_TABLET | SUBLINGUAL | 1 refills | Status: DC

## 2019-06-10 ENCOUNTER — Other Ambulatory Visit: Payer: Self-pay | Admitting: Family Medicine

## 2019-06-10 DIAGNOSIS — Z1231 Encounter for screening mammogram for malignant neoplasm of breast: Secondary | ICD-10-CM

## 2019-06-16 ENCOUNTER — Other Ambulatory Visit: Payer: Self-pay | Admitting: Family Medicine

## 2019-06-16 DIAGNOSIS — E041 Nontoxic single thyroid nodule: Secondary | ICD-10-CM

## 2019-07-26 ENCOUNTER — Ambulatory Visit
Admission: RE | Admit: 2019-07-26 | Discharge: 2019-07-26 | Disposition: A | Discharge status: Home / Self Care | Payer: Medicare Other | Source: Ambulatory Visit | Attending: *Deleted | Admitting: *Deleted

## 2019-07-26 ENCOUNTER — Other Ambulatory Visit: Payer: Self-pay

## 2019-07-26 DIAGNOSIS — Z1231 Encounter for screening mammogram for malignant neoplasm of breast: Secondary | ICD-10-CM | POA: Diagnosis not present

## 2019-07-30 ENCOUNTER — Other Ambulatory Visit: Payer: Self-pay | Admitting: Family Medicine

## 2019-08-10 ENCOUNTER — Other Ambulatory Visit: Payer: Self-pay | Admitting: Interventional Cardiology

## 2019-08-10 DIAGNOSIS — R0789 Other chest pain: Secondary | ICD-10-CM

## 2019-09-25 ENCOUNTER — Other Ambulatory Visit: Payer: Self-pay | Admitting: Family Medicine

## 2019-10-05 ENCOUNTER — Other Ambulatory Visit: Payer: Self-pay | Admitting: Surgery

## 2019-10-05 DIAGNOSIS — H02831 Dermatochalasis of right upper eyelid: Secondary | ICD-10-CM | POA: Diagnosis not present

## 2019-10-05 DIAGNOSIS — I712 Thoracic aortic aneurysm, without rupture, unspecified: Secondary | ICD-10-CM

## 2019-10-05 DIAGNOSIS — H40023 Open angle with borderline findings, high risk, bilateral: Secondary | ICD-10-CM | POA: Diagnosis not present

## 2019-10-05 DIAGNOSIS — H02834 Dermatochalasis of left upper eyelid: Secondary | ICD-10-CM | POA: Diagnosis not present

## 2019-10-26 NOTE — Progress Notes (Signed)
Cardiology Office Note   Date:  10/27/2019   ID:  [name], DOB April 11, 1930, MRN [id number]  PCP:  Lattie Haw, MD    No chief complaint on file.  Chronic diastolic heart failure  Wt Readings from Last 3 Encounters:  10/27/19 207 lb 9.6 oz (94.2 kg)  04/08/19 218 lb 3.2 oz (99 kg)  03/16/19 213 lb 3.2 oz (96.7 kg)       History of Present Illness: Bianca [name] is a 86 y.o. female  Who has Permanent atrial fibrillation and chronic diastolic heart failure. Echo in 12/17 showed:  Left ventricle: The cavity size was normal. There was mild concentric hypertrophy. Systolic function was normal. Wall motion was normal; there were no regional wall motion abnormalities. - Aortic valve: There was no regurgitation. - Ascending aorta: The ascending aorta was moderately dilated measuring 47 mm. - Mitral valve: Mildly thickened leaflets . There was mild regurgitation. - Left atrium: The atrium was severely dilated. - Right ventricle: The cavity size was mildly dilated. Wall thickness was normal. Systolic function was mildly reduced. - Right atrium: The atrium was moderately dilated. - Tricuspid valve: There was mild regurgitation. - Pulmonic valve: There was mild regurgitation. - Pulmonary arteries: Systolic pressure was mildly increased. PA peak pressure: 38 mm Hg (S). - Inferior vena cava: The vessel was normal in size.  MRA in 1/18 showed:Aneurysmal dilatation of the ascending thoracic aorta measuring up to 4.7 cm in maximal diameter. The aortic root/sinuses of Valsalva  do not show aneurysmal dilatation.  She has had leg swelling in the past. She had been taking daily Lasix, although it was prescribed twice a day.  In 12/18, she had some chest discomfort, started epigastric and went up into her chest. She had drank some soda and that helped. She ate some sausages that she would not normally eat , that evening. No further since that  time.  In May 2020, BP was checked at home.  Meds were not changed: "Given her advanced age and h/o bradycardia, will hold course for now thenespecially since she periodically swings down to 114-121."  Since the last visit, she got her COVID shots.    Denies : Chest pain. Dizziness. Leg edema. Nitroglycerin use. Orthopnea. Palpitations. Paroxysmal nocturnal dyspnea. Shortness of breath. Syncope.    Past Medical History:  Diagnosis Date  . Bradycardia    a. Holter monitor in the past showed bradycardia/pauses, so she is not on nodal blockers.   . Chronic diastolic heart failure ([place])    a. 12/2015 showed normal EF, mild LVH, 59mmm ascending aortc aneurysm, severe LAE, mildly dilated RV with mildly reduced RVF, mild TR/PR, mildly increased PASP.  Marland Kitchen GERD (gastroesophageal reflux disease)   . Hx of cardiovascular stress test    Lexiscan Myoview (10/14):  Fixed inf defect, no ischemia, study not gated  . Hx of echocardiogram    Echo (11/14):  Mild LVH, EF 60-65%, mod to severe LAE, mod RVE, mild to mod reduced RVSF, mild RAE, PASP 38  . Hypertension   . Long term current use of anticoagulant   . Mild pulmonary hypertension ([place])   . Obesity   . Osteoarthritis   . Permanent atrial fibrillation ([place])   . Refusal of blood transfusions as patient is Jehovah's Witness   . Thoracic ascending aortic aneurysm [facility])     Past Surgical History:  Procedure Laterality Date  . ABDOMINAL HYSTERECTOMY    . CARPAL TUNNEL RELEASE    .  CHOLECYSTECTOMY    . IRRIGATION AND DEBRIDEMENT SEBACEOUS CYST  1980   sebaceous cysts removed from tail bone  . KNEE ARTHROSCOPY Bilateral   . TUBAL LIGATION       Current Outpatient Medications  Medication Sig Dispense Refill  . amLODipine-benazepril (LOTREL) 5-20 MG capsule Take 1 capsule by mouth daily.    . calcium-vitamin D (OSCAL WITH D 500-200) 500-200 MG-UNIT per tablet Take 1 tablet by mouth daily.      . Cholecalciferol (VITAMIN D-3) 1000 UNITS CAPS  Take 1,000 Units by mouth daily.     . clotrimazole (LOTRIMIN) 1 % cream APPLY TO AFFECTED AREA(S)  TOPICALLY TWO TIMES DAILY 30 g 0  . diclofenac sodium (VOLTAREN) 1 % GEL Apply 4 g topically 4 (four) times daily. 150 g 3  . ELIQUIS 5 MG TABS tablet TAKE 1 TABLET BY MOUTH  TWICE DAILY 180 tablet 1  . fish oil-omega-3 fatty acids 1000 MG capsule Take 2 g by mouth daily.    . furosemide (LASIX) 40 MG tablet TAKE 1 AND 1/2 TABLETS BY  MOUTH DAILY 135 tablet 3  . hydrocortisone cream 1 % Apply 1 application topically 2 (two) times daily. Only on face 30 g 0  . MAPAP ARTHRITIS PAIN 650 MG CR tablet TAKE 1 TABLET BY MOUTH  EVERY 8 HOURS AS NEEDED FOR PAIN 120 tablet 0  . Multiple Vitamin (MULTIVITAMIN) tablet Take 1 tablet by mouth daily.      . nitroGLYCERIN (NITROSTAT) 0.4 MG SL tablet DISSOLVE 1 TABLET UNDER THE TONGUE EVERY 5 MINUTES AS  NEEDED FOR CHEST PAIN. MAX  OF 3 TABLETS IN 15 MINUTES. CALL 911 IF PAIN PERSISTS. 100 tablet 3  . nystatin-triamcinolone (MYCOLOG II) cream Apply to under breasts and on abdomen twice daily 30 g 0  . spironolactone (ALDACTONE) 25 MG tablet TAKE ONE-HALF TABLET BY  MOUTH DAILY 45 tablet 1  . terbinafine (LAMISIL AT) 1 % cream Apply 1 application topically 2 (two) times daily. 30 g 0   No current facility-administered medications for this visit.    Allergies:   Hydrocodone, Morphine, and Penicillins    Social History:  The patient  reports that she quit smoking about 51 years ago. Her smoking use included cigarettes. She has a 5.00 pack-year smoking history. She has never used smokeless tobacco. She reports that she does not drink alcohol and does not use drugs.   Family History:  The patient's family history includes Asthma in her mother; Breast cancer in her maternal grandmother; Breast cancer (age of onset: 24) in her daughter; Cancer in her daughter, mother, and son; Clotting disorder in her daughter; Colon cancer in her mother; Depression in her daughter,  daughter, and daughter; Diabetes in her brother, sister, and another family member; Hypertension in an other family member; Kidney cancer in her son.    ROS:  Please see the history of present illness.   Otherwise, review of systems are positive for mild imbalance- uses a cane.   All other systems are reviewed and negative.    PHYSICAL EXAM: VS:  BP (!) 158/88   Pulse 78   Ht 5\' 6"  (1.676 m)   Wt 207 lb 9.6 oz (94.2 kg)   SpO2 98%   BMI 33.51 kg/m  , BMI Body mass index is 33.51 kg/m. GEN: Well nourished, well developed, in no acute distress  HEENT: normal  Neck: no JVD, carotid bruits, or masses Cardiac: irregularly iregular; no murmurs, rubs, or gallops,no edema  Respiratory:  clear to auscultation bilaterally, normal work of breathing GI: soft, nontender, nondistended, + BS MS: no deformity or atrophy , left knee cap prominence Skin: warm and dry, no rash Neuro:  Strength and sensation are intact Psych: euthymic mood, full affect   EKG:   The ekg ordered today demonstrates AFib rate controlled.   Recent Labs: No results found for requested labs within last 8760 hours.   Lipid Panel    Component Value Date/Time   CHOL 131 07/06/2018 0744   TRIG 82 07/06/2018 0744   HDL 44 07/06/2018 0744   CHOLHDL 3.0 07/06/2018 0744   CHOLHDL 3.6 12/21/2014 1523   VLDL 23 12/21/2014 1523   LDLCALC 71 07/06/2018 0744   LDLDIRECT 119 (H) 06/14/2009 1819     Other studies Reviewed: Additional studies/ records that were reviewed today with results demonstrating: 2020 labs reviewed.   ASSESSMENT AND PLAN:  1. Chronic diastolic heart failure: Mild ankle edema.  Elevate legs.  Most euvolemic. Continue current dose of Lasix.  2. AFib: Rate controlled.  Eliquis for stroke prevention.   3. Thoracic aneurysm: Followed by MR in the past. 4.6 cm in 2020 - unchanged from prior.  SHe would not want surgery even if this had grown, so wants to cancel her next imaging study which is scheduled  for 11/03/19.  Will inform Dr. Cyndia Bent.   4. Anticoagulated: No bleeding issues. 5. Hypertensive heart disease: Check BP at home.  If it is high, will increase Lotrel to 10/20 mg daily.  OTW, continue 5/20 mg daily.  6. Check labs today.   Current medicines are reviewed at length with the patient today.  The patient concerns regarding her medicines were addressed.  The following changes have been made:  No change  Labs/ tests ordered today include:   Orders Placed This Encounter  Procedures  . Comprehensive metabolic panel  . Lipid panel  . CBC  . TSH  . Hemoglobin A1c  . EKG 12-Lead    Recommend 150 minutes/week of aerobic exercise Low fat, low carb, high fiber diet recommended  Disposition:   FU in 1 year   Signed, Larae Grooms, MD  10/27/2019 11:02 AM    [facility] [place], [place], [place]  [address] Phone: [phone]; Fax: [phone]

## 2019-10-27 ENCOUNTER — Encounter: Payer: Self-pay | Admitting: Interventional Cardiology

## 2019-10-27 ENCOUNTER — Other Ambulatory Visit: Payer: Self-pay

## 2019-10-27 ENCOUNTER — Ambulatory Visit (INDEPENDENT_AMBULATORY_CARE_PROVIDER_SITE_OTHER): Payer: Medicare Other | Admitting: Interventional Cardiology

## 2019-10-27 VITALS — BP 158/88 | HR 78 | Ht 66.0 in | Wt 207.6 lb

## 2019-10-27 DIAGNOSIS — I5032 Chronic diastolic (congestive) heart failure: Secondary | ICD-10-CM

## 2019-10-27 DIAGNOSIS — Z7901 Long term (current) use of anticoagulants: Secondary | ICD-10-CM

## 2019-10-27 DIAGNOSIS — R7301 Impaired fasting glucose: Secondary | ICD-10-CM

## 2019-10-27 DIAGNOSIS — I712 Thoracic aortic aneurysm, without rupture, unspecified: Secondary | ICD-10-CM

## 2019-10-27 DIAGNOSIS — I110 Hypertensive heart disease with heart failure: Secondary | ICD-10-CM

## 2019-10-27 DIAGNOSIS — I4819 Other persistent atrial fibrillation: Secondary | ICD-10-CM

## 2019-10-27 NOTE — Patient Instructions (Signed)
Medication Instructions:  Your physician recommends that you continue on your current medications as directed. Please refer to the Current Medication list given to you today.  *If you need a refill on your cardiac medications before your next appointment, please call your pharmacy*   Lab Work: TODAY: CMET, LIPIDS, CBC, TSH, A1C  If you have labs (blood work) drawn today and your tests are completely normal, you will receive your results only by: Marland Kitchen MyChart Message (if you have MyChart) OR . A paper copy in the mail If you have any lab test that is abnormal or we need to change your treatment, we will call you to review the results.   Testing/Procedures: None   Follow-Up: At [facility], you and your health needs are our priority.  As part of our continuing mission to provide you with exceptional heart care, we have created designated Provider Care Teams.  These Care Teams include your primary Cardiologist (physician) and Advanced Practice Providers (APPs -  Physician Assistants and Nurse Practitioners) who all work together to provide you with the care you need, when you need it.  We recommend signing up for the patient portal called "MyChart".  Sign up information is provided on this After Visit Summary.  MyChart is used to connect with patients for Virtual Visits (Telemedicine).  Patients are able to view lab/test results, encounter notes, upcoming appointments, etc.  Non-urgent messages can be sent to your provider as well.   To learn more about what you can do with MyChart, go to [email or web address].    Your next appointment:   12 month(s)  The format for your next appointment:   In Person  Provider:   You may see Larae Grooms, MD or one of the following Advanced Practice Providers on your designated Care Team:    Melina Copa, PA-C  Ermalinda Barrios, PA-C    Other Instructions Your physician has requested that you regularly monitor and record your blood pressure  readings at home. Please use the same machine at the same time of day to check your readings and record them.

## 2019-10-28 ENCOUNTER — Other Ambulatory Visit: Payer: Self-pay | Admitting: Family Medicine

## 2019-10-28 DIAGNOSIS — R7989 Other specified abnormal findings of blood chemistry: Secondary | ICD-10-CM

## 2019-10-28 LAB — COMPREHENSIVE METABOLIC PANEL
ALT: 12 IU/L (ref 0–32)
AST: 20 IU/L (ref 0–40)
Albumin/Globulin Ratio: 1.7 (ref 1.2–2.2)
Albumin: 4.3 g/dL (ref 3.6–4.6)
Alkaline Phosphatase: 101 IU/L (ref 44–121)
BUN/Creatinine Ratio: 23 (ref 12–28)
BUN: 17 mg/dL (ref 8–27)
Bilirubin Total: 0.7 mg/dL (ref 0.0–1.2)
CO2: 29 mmol/L (ref 20–29)
Calcium: 10.4 mg/dL — ABNORMAL HIGH (ref 8.7–10.3)
Chloride: 102 mmol/L (ref 96–106)
Creatinine, Ser: 0.74 mg/dL (ref 0.57–1.00)
GFR calc Af Amer: 84 mL/min/{1.73_m2} (ref >59)
GFR calc non Af Amer: 73 mL/min/{1.73_m2} (ref >59)
Globulin, Total: 2.6 g/dL (ref 1.5–4.5)
Glucose: 111 mg/dL — ABNORMAL HIGH (ref 65–99)
Potassium: 4.6 mmol/L (ref 3.5–5.2)
Sodium: 140 mmol/L (ref 134–144)
Total Protein: 6.9 g/dL (ref 6.0–8.5)

## 2019-10-28 LAB — CBC
Hematocrit: 39.3 % (ref 34.0–46.6)
Hemoglobin: 12.3 g/dL (ref 11.1–15.9)
MCH: 27.8 pg (ref 26.6–33.0)
MCHC: 31.3 g/dL — ABNORMAL LOW (ref 31.5–35.7)
MCV: 89 fL (ref 79–97)
Platelets: 193 10*3/uL (ref 150–450)
RBC: 4.43 x10E6/uL (ref 3.77–5.28)
RDW: 12.8 % (ref 11.7–15.4)
WBC: 5.9 10*3/uL (ref 3.4–10.8)

## 2019-10-28 LAB — LIPID PANEL
Chol/HDL Ratio: 3 ratio (ref 0.0–4.4)
Cholesterol, Total: 137 mg/dL (ref 100–199)
HDL: 45 mg/dL (ref >39)
LDL Chol Calc (NIH): 78 mg/dL (ref 0–99)
Triglycerides: 66 mg/dL (ref 0–149)
VLDL Cholesterol Cal: 14 mg/dL (ref 5–40)

## 2019-10-28 LAB — HEMOGLOBIN A1C
Est. average glucose Bld gHb Est-mCnc: 111 mg/dL
Hgb A1c MFr Bld: 5.5 % (ref 4.8–5.6)

## 2019-10-28 LAB — TSH: TSH: 0.005 u[IU]/mL — ABNORMAL LOW (ref 0.450–4.500)

## 2019-10-29 ENCOUNTER — Other Ambulatory Visit: Payer: Self-pay | Admitting: Family Medicine

## 2019-10-29 ENCOUNTER — Telehealth: Payer: Self-pay | Admitting: *Deleted

## 2019-10-29 NOTE — Telephone Encounter (Signed)
Pt has an appt on 10/20. Ottis Stain, CMA

## 2019-10-29 NOTE — Telephone Encounter (Signed)
-----   Message from Lattie Haw, MD sent at 10/28/2019  2:30 PM EDT ----- Regarding: Follow up Hi team,  Please could you book a follow up with any provider at [facility] for this pt? She was found to have an abnormal TSH at a recent visit and she need further labs. I can order these now so she can have them prior to the clinic. Thank you, appreciate it  [place]

## 2019-11-02 NOTE — Progress Notes (Signed)
    SUBJECTIVE:   CHIEF COMPLAINT / HPI:   Decreased TSH TSH 0.005 on labs at cardiologist's office Has been feeling more weak Thinks her heart beats a lot when she does things Weight has been decreasing Np throat pain, but sometimes has a fullness in her throat  Thinks she feels some bumps in her lower neck Prior imaging of chest has shown no-specific nodularity She has never had a thyroid US  Atrial fibrillation and chronic diastolic heart failure Follows with Dr. Irish Lack, last seen on 10/13 She had labs performed at this time, how TSH was found above   PERTINENT  PMH / PSH: Persistent atrial fibrillation follows with cardiology, chronic diastolic heart failure, thoracic aortic aneurysm without rupture, long-term use of anticoagulation, hypertension  OBJECTIVE:   BP 140/60   Pulse 80   Ht 5\' 6"  (1.676 m)   Wt 207 lb (93.9 kg)   SpO2 99%   BMI 33.41 kg/m    Physical Exam:  General: 86 y.o. female in NAD Neck: fullness of anterior neck, no obvious nodules palpated Lungs: breathing comfortably on RA Skin: warm and dry Extremities: ambulating well   ASSESSMENT/PLAN:   Low TSH level Patient with low TSH level on check 1 week ago from cardiology office.  She has been feeling unwell, not having necessarily specific hyperthyroidism symptoms.  She does have a history of nodularity of her thyroid that has been seen on prior imaging of her chest.  We will go ahead and obtain another TSH, free T4 and T3.  She will follow-up in 2 to 4 weeks with Dr. Posey Pronto.  At that time, could consider further investigation into her weight loss, especially if her thyroid labs are unrevealing.  Thyroid nodule Given that she has had nodularity noted on prior imaging of her chest and now has a low TSH, will go ahead and order a thyroid ultrasound to further evaluate.     Cleophas Dunker, [place]

## 2019-11-03 ENCOUNTER — Ambulatory Visit: Payer: Medicare Other | Admitting: Surgery

## 2019-11-03 ENCOUNTER — Other Ambulatory Visit: Payer: Self-pay

## 2019-11-03 ENCOUNTER — Other Ambulatory Visit: Payer: Medicare Other

## 2019-11-03 ENCOUNTER — Ambulatory Visit (INDEPENDENT_AMBULATORY_CARE_PROVIDER_SITE_OTHER): Payer: Medicare Other | Admitting: Family Medicine

## 2019-11-03 ENCOUNTER — Encounter: Payer: Self-pay | Admitting: Family Medicine

## 2019-11-03 VITALS — BP 140/60 | HR 80 | Ht 66.0 in | Wt 207.0 lb

## 2019-11-03 DIAGNOSIS — E041 Nontoxic single thyroid nodule: Secondary | ICD-10-CM | POA: Diagnosis not present

## 2019-11-03 DIAGNOSIS — R7989 Other specified abnormal findings of blood chemistry: Secondary | ICD-10-CM | POA: Diagnosis not present

## 2019-11-03 DIAGNOSIS — E0590 Thyrotoxicosis, unspecified without thyrotoxic crisis or storm: Secondary | ICD-10-CM | POA: Diagnosis not present

## 2019-11-03 NOTE — Assessment & Plan Note (Signed)
Given that she has had nodularity noted on prior imaging of her chest and now has a low TSH, will go ahead and order a thyroid ultrasound to further evaluate.

## 2019-11-03 NOTE — Patient Instructions (Signed)
Thank you for coming to see me today. It was a pleasure. Today we talked about:   We will get some labs today.  If they are abnormal or we need to do something about them, I will call you.  If they are normal, I will send you a message on MyChart (if it is active) or a letter in the mail.  If you don't hear from [place] in 2 weeks, please call the office at the number below.  We will also have you get an [place] of your thyroid since you have a history of having nodules in it to get a better look at them.  Please follow-up with your PCP in 2-4 weeks.  If you have any questions or concerns, please do not hesitate to call the office at [phone].  Best,   [place] Constable, DO

## 2019-11-03 NOTE — Assessment & Plan Note (Addendum)
Patient with low TSH level on check 1 week ago from cardiology office.  She has been feeling unwell, not having necessarily specific hyperthyroidism symptoms.  She does have a history of nodularity of her thyroid that has been seen on prior imaging of her chest.  We will go ahead and obtain another TSH, free T4 and T3.  She will follow-up in 2 to 4 weeks with Dr. Posey Pronto.  At that time, could consider further investigation into her weight loss, especially if her thyroid labs are unrevealing.

## 2019-11-04 LAB — T3, FREE: T3, Free: 4.2 pg/mL (ref 2.0–4.4)

## 2019-11-04 LAB — T4, FREE: Free T4: 1.6 ng/dL (ref 0.82–1.77)

## 2019-11-04 LAB — TSH: TSH: <0.005 u[IU]/mL — ABNORMAL LOW (ref 0.450–4.500)

## 2019-11-12 ENCOUNTER — Ambulatory Visit (HOSPITAL_COMMUNITY)
Admission: RE | Admit: 2019-11-12 | Discharge: 2019-11-12 | Disposition: A | Discharge status: Home / Self Care | Payer: Medicare Other | Source: Ambulatory Visit | Attending: Family Medicine | Admitting: Family Medicine

## 2019-11-12 ENCOUNTER — Other Ambulatory Visit: Payer: Self-pay | Admitting: Family Medicine

## 2019-11-12 ENCOUNTER — Other Ambulatory Visit: Payer: Self-pay | Admitting: Interventional Cardiology

## 2019-11-12 DIAGNOSIS — R7989 Other specified abnormal findings of blood chemistry: Secondary | ICD-10-CM | POA: Diagnosis not present

## 2019-11-12 DIAGNOSIS — I10 Essential (primary) hypertension: Secondary | ICD-10-CM

## 2019-11-12 DIAGNOSIS — E041 Nontoxic single thyroid nodule: Secondary | ICD-10-CM

## 2019-11-12 DIAGNOSIS — R0602 Shortness of breath: Secondary | ICD-10-CM

## 2019-11-12 DIAGNOSIS — E042 Nontoxic multinodular goiter: Secondary | ICD-10-CM | POA: Diagnosis not present

## 2019-11-12 DIAGNOSIS — I4819 Other persistent atrial fibrillation: Secondary | ICD-10-CM

## 2019-11-12 DIAGNOSIS — I5032 Chronic diastolic (congestive) heart failure: Secondary | ICD-10-CM

## 2019-11-12 NOTE — Telephone Encounter (Signed)
Eliquis 5mg  refill request received. Patient is 86 years old, weight-93.9kg, Crea-0.74 on 10/27/2019, Diagnosis-Afib, and last seen by Dr. Irish Lack on 10/27/2019. Dose is appropriate based on dosing criteria. Will send in refill to requested pharmacy.

## 2019-11-15 ENCOUNTER — Telehealth: Payer: Self-pay | Admitting: Family Medicine

## 2019-11-15 DIAGNOSIS — E041 Nontoxic single thyroid nodule: Secondary | ICD-10-CM

## 2019-11-15 NOTE — Telephone Encounter (Signed)
Attempted to call patient to discuss [place] results, no answer, unable to leave VM.  If calls back, she will need a biopsy of one of the nodules in her thyroid to further assess.  We will contact her to schedule.

## 2019-11-15 NOTE — Telephone Encounter (Signed)
Attempted to call again on mobile number.  No answer, unable to leave voicemail.

## 2019-11-15 NOTE — Telephone Encounter (Signed)
error 

## 2019-11-23 ENCOUNTER — Ambulatory Visit
Admission: RE | Admit: 2019-11-23 | Discharge: 2019-11-23 | Disposition: A | Discharge status: Home / Self Care | Payer: Medicare Other | Source: Ambulatory Visit | Attending: Family Medicine | Admitting: Family Medicine

## 2019-11-23 ENCOUNTER — Other Ambulatory Visit (HOSPITAL_COMMUNITY)
Admission: RE | Admit: 2019-11-23 | Discharge: 2019-11-23 | Disposition: A | Discharge status: Home / Self Care | Payer: Medicare Other | Source: Ambulatory Visit | Attending: Family Medicine | Admitting: Family Medicine

## 2019-11-23 DIAGNOSIS — D34 Benign neoplasm of thyroid gland: Secondary | ICD-10-CM | POA: Diagnosis not present

## 2019-11-23 DIAGNOSIS — E041 Nontoxic single thyroid nodule: Secondary | ICD-10-CM | POA: Diagnosis not present

## 2019-11-24 LAB — CYTOLOGY - NON PAP

## 2019-12-13 NOTE — Progress Notes (Signed)
     SUBJECTIVE:   CHIEF COMPLAINT / HPI:   Bianca [name] is a 86 y.o. female presents for follow up   Thyroid nodule Presents for follow up following thyroid US and nodule biopsy. TSH 0.005 on 10/13. Repeat TSH <0.005, T4 1.6 and T3 4.2 on 10/20. US thyroid on 10/29: 3.5cm  right mid thyroid nodule. FNA on 11/9 and consistent with benign follicular nodule. Patient endorses fatigue as listed below.  Fatigue, weakness  Feeling tired and weakness for a few months. Weight loss of 10lb over the last 6 months. Decreased appetite. Usually eats yoghurt and only one meal a day. Also takes ensure shakes. Denies fevers, night sweats, diarrhea, constipation, melena or rectal bleeding.  No low mood, active or passive SI, thoughts of self harm.  PERTINENT  PMH / PSH:  thyroid nodule, atrial fibrillation, HTN, and chronic diastolic heart failure  OBJECTIVE:   BP 115/80   Pulse 83   Ht $R'5\' 6"'SE$  (1.676 m)   Wt 207 lb (93.9 kg)   SpO2 98%   BMI 33.41 kg/m    General: Alert, no acute distress, pleasant  Cardio: Normal S1 and S2, RRR, no r/m/g Pulm: CTAB, normal work of breathing Abdomen: Bowel sounds normal. Abdomen soft and non-tender.  Extremities: No peripheral edema.  Neuro: Cranial nerves grossly intact   ASSESSMENT/PLAN:   Thyroid nodule Explained results of thyroid biopsy to patient which showed benign follicular nodule. Will repeat TSH today. Discussed with Dr Andria Frames who recommended referral to endocrinology given abnormal TSH, T3 and T4 on upper limit of normal and symptoms of fatigue. She could have hyperthyroidism.  Fatigue Differential is broad for weakness and fatigue. Patient has had weight loss of 10lb this year but this is likely due to reduced appetite. I am unsure why she is having a reduced appetite. Depression is unlikely as pt endorses normal mood. Considered malignancy especially GI malignancy. Denies change in bowel habit. Last colonoscopy was 2012 which showed benign  polyps in rectum. Her symptoms could be multifactorial secondary to heart failure, Afib and possibly hyperthyroidism. Pt has had regular follow up with her cardiologist. Cannot exclude PMR/GCA. -TSH, CMP, ESR, anemia panel -Follow up with myself on 20th December     Lattie Haw, MD PGY-2 [place]

## 2019-12-14 ENCOUNTER — Other Ambulatory Visit: Payer: Self-pay

## 2019-12-14 ENCOUNTER — Ambulatory Visit (INDEPENDENT_AMBULATORY_CARE_PROVIDER_SITE_OTHER): Payer: Medicare Other | Admitting: Family Medicine

## 2019-12-14 ENCOUNTER — Encounter: Payer: Self-pay | Admitting: Family Medicine

## 2019-12-14 VITALS — BP 115/80 | HR 83 | Ht 66.0 in | Wt 207.0 lb

## 2019-12-14 DIAGNOSIS — I158 Other secondary hypertension: Secondary | ICD-10-CM

## 2019-12-14 DIAGNOSIS — R7989 Other specified abnormal findings of blood chemistry: Secondary | ICD-10-CM

## 2019-12-14 DIAGNOSIS — R5383 Other fatigue: Secondary | ICD-10-CM | POA: Insufficient documentation

## 2019-12-14 DIAGNOSIS — E041 Nontoxic single thyroid nodule: Secondary | ICD-10-CM | POA: Diagnosis not present

## 2019-12-14 DIAGNOSIS — R634 Abnormal weight loss: Secondary | ICD-10-CM

## 2019-12-14 NOTE — Assessment & Plan Note (Signed)
Explained results of thyroid biopsy to patient which showed benign follicular nodule. Will repeat TSH today. Discussed with Dr Andria Frames who recommended referral to endocrinology given abnormal TSH, T3 and T4 on upper limit of normal and symptoms of fatigue. She could have hyperthyroidism.

## 2019-12-14 NOTE — Assessment & Plan Note (Signed)
Differential is broad for weakness and fatigue. Patient has had weight loss of 10lb this year but this is likely due to reduced appetite. I am unsure why she is having a reduced appetite. Depression is unlikely as pt endorses normal mood. Considered malignancy especially GI malignancy. Denies change in bowel habit. Last colonoscopy was 2012 which showed benign polyps in rectum. Her symptoms could be multifactorial secondary to heart failure, Afib and possibly hyperthyroidism. Pt has had regular follow up with her cardiologist. Cannot exclude PMR/GCA. -TSH, CMP, ESR, anemia panel -Follow up with myself on 20th December

## 2019-12-14 NOTE — Patient Instructions (Signed)
Thank you for coming to see me today. It was a pleasure. I am not sure why you are feeling tired. We are running some tests and you can follow up with me after in a few weeks.   I am referring you to endocrine to investigate the cause of your thyroid disorder and whether you need treatment.   We will get some labs today.  If they are abnormal or we need to do something about them, I will call you.  If they are normal, I will send you a message on MyChart (if it is active) or a letter in the mail.  If you don't hear from [place] in 2 weeks, please call the office at the number below.  If you have any questions or concerns, please do not hesitate to call the office at [phone].  If you develop fevers>100.5, shortness of breath, chest pain, palpitations, dizziness, abdominal pain, nausea, vomiting, diarrhea or cannot eat or drink then please go to the ER immediately.  Best wishes,   Dr Posey Pronto

## 2019-12-15 LAB — ANEMIA PANEL
Ferritin: 240 ng/mL — ABNORMAL HIGH (ref 15–150)
Folate, Hemolysate: 439 ng/mL
Folate, RBC: 1146 ng/mL (ref >498)
Hematocrit: 38.3 % (ref 34.0–46.6)
Iron Saturation: 27 % (ref 15–55)
Iron: 64 ug/dL (ref 27–139)
Retic Ct Pct: 1.9 % (ref 0.6–2.6)
Total Iron Binding Capacity: 235 ug/dL — ABNORMAL LOW (ref 250–450)
UIBC: 171 ug/dL (ref 118–369)
Vitamin B-12: 612 pg/mL (ref 232–1245)

## 2019-12-15 LAB — VITAMIN D 25 HYDROXY (VIT D DEFICIENCY, FRACTURES): Vit D, 25-Hydroxy: 59.8 ng/mL (ref 30.0–100.0)

## 2019-12-15 LAB — COMPREHENSIVE METABOLIC PANEL
ALT: 14 IU/L (ref 0–32)
AST: 19 IU/L (ref 0–40)
Albumin/Globulin Ratio: 1.7 (ref 1.2–2.2)
Albumin: 4.3 g/dL (ref 3.6–4.6)
Alkaline Phosphatase: 94 IU/L (ref 44–121)
BUN/Creatinine Ratio: 22 (ref 12–28)
BUN: 15 mg/dL (ref 8–27)
Bilirubin Total: 0.8 mg/dL (ref 0.0–1.2)
CO2: 26 mmol/L (ref 20–29)
Calcium: 10.1 mg/dL (ref 8.7–10.3)
Chloride: 105 mmol/L (ref 96–106)
Creatinine, Ser: 0.69 mg/dL (ref 0.57–1.00)
GFR calc Af Amer: 91 mL/min/{1.73_m2} (ref >59)
GFR calc non Af Amer: 79 mL/min/{1.73_m2} (ref >59)
Globulin, Total: 2.5 g/dL (ref 1.5–4.5)
Glucose: 119 mg/dL — ABNORMAL HIGH (ref 65–99)
Potassium: 4 mmol/L (ref 3.5–5.2)
Sodium: 144 mmol/L (ref 134–144)
Total Protein: 6.8 g/dL (ref 6.0–8.5)

## 2019-12-15 LAB — SEDIMENTATION RATE: Sed Rate: 13 mm/hr (ref 0–40)

## 2019-12-15 LAB — TSH: TSH: <0.005 u[IU]/mL — ABNORMAL LOW (ref 0.450–4.500)

## 2019-12-23 ENCOUNTER — Telehealth: Payer: Self-pay

## 2019-12-23 NOTE — Telephone Encounter (Signed)
Patient calls nurse line requesting lab results from visit on 11/30. Unable to find result note or letter to provide results to patient.   To PCP  Please advise  Talbot Grumbling, RN

## 2019-12-23 NOTE — Telephone Encounter (Signed)
She has an abnormal TSH which is unchanged from previous, other labs were stable. She has an appointment with me later this month for follow up. Thanks [employer]

## 2019-12-27 NOTE — Telephone Encounter (Signed)
Pt informed of below.Bianca [name], CMA ? ?

## 2020-01-03 ENCOUNTER — Other Ambulatory Visit: Payer: Self-pay

## 2020-01-03 ENCOUNTER — Encounter: Payer: Self-pay | Admitting: Family Medicine

## 2020-01-03 ENCOUNTER — Ambulatory Visit (INDEPENDENT_AMBULATORY_CARE_PROVIDER_SITE_OTHER): Payer: Medicare Other | Admitting: Family Medicine

## 2020-01-03 DIAGNOSIS — R5383 Other fatigue: Secondary | ICD-10-CM

## 2020-01-03 DIAGNOSIS — G479 Sleep disorder, unspecified: Secondary | ICD-10-CM

## 2020-01-03 MED ORDER — RAMELTEON 8 MG PO TABS: 8.0000 mg | ORAL_TABLET | Freq: Every day | ORAL | 0 refills | Status: DC

## 2020-01-03 NOTE — Assessment & Plan Note (Addendum)
Fatigue is likely multifactorial likely 2/2 age, CHF, possible hyperthyroidism and possibly exacerbated by her grandson's death a few months ago. Patient is in agreement with this. She is overall doing better today in terms of her energy levels. Labs at previous visit wnl. Provided pt with contact details for Endocrinology but has not seen them yet about hyperthyroidism. She will contact them regarding an appointment. Most recent echo was 2017 per chart review showing normal EF, however patient reports she has had one sooner. She will ask her cardiologist if she needs a newer one based on her fatigue symptoms. Her mood is also improved today and she appears more engaging and jovial compared to previous visits. Denies active or passive SI. He declined PT due to Covid levels rising. I will continue to monitor her energy levels next month.

## 2020-01-03 NOTE — Assessment & Plan Note (Signed)
Pt open happy to try Ramelteon. Sent Ramelteon 8mg  to her pharmacy. PCP follow up next month.

## 2020-01-03 NOTE — Patient Instructions (Addendum)
Thank you for coming to see me today. It was a pleasure. I am glad to see you smiling again!! I have prescribed Ramleteon for your sleep. You can take this daily for your sleep.   Please follow-up within 1 month for your sleep.   Please contact cardiologist about getting a new heart ultrasound.    Please contact the Endocrine doctors for a follow up: Address: [address], [place], [place] [address] Phone: [phone]  If you have any questions or concerns, please do not hesitate to call the office at [phone].  If you develop fevers>100.5, shortness of breath, chest pain, palpitations, dizziness, abdominal pain, nausea, vomiting, diarrhea or cannot eat or drink then please go to the ER immediately.  Best wishes,   Dr Posey Pronto

## 2020-01-03 NOTE — Progress Notes (Signed)
° ° ° °  SUBJECTIVE:   CHIEF COMPLAINT / HPI:   Bianca [name] is a 86 y.o. female presents for fatigue follow up  Fatigue  Pt recently seen in clinic for fatigue. On 11/30: CMP wnl, VitD, B12, folate wnl, ESR 13, Ferritin 240, TIBC 235.  I referred to Endocrine for abnormal TFTs and concern for hyperthyroidism but she missed their calls. Her fatigue is improving slowly since the last clinic visit. Does not want to do physical therapy right now due to [place]. Denies low mood or suicidal thoughts. She reports her grandson died a few months ago due to [place].   Insomnia  Goes to bed at 10am but unable to fall asleep around 2-3am. Sometimes she is awake all night long. She wakes up early and unable to get back to sleep. Does not nap during the day. Denies use use of phones/ipads before sleeping. She reports she has never been a "big sleeper". Has tried Trazadone, tylenol pm, melatonin in the past without improvement in symptoms.   PERTINENT  PMH / PSH: thyroid nodule, atrial fibrillation, HTN, and chronic diastolic heart failure  OBJECTIVE:   BP 124/82    Pulse 92    Ht 5' 6" (1.676 m)    Wt 205 lb 12.8 oz (93.4 kg)    SpO2 95%    BMI 33.22 kg/m    General: Alert, no acute distress, pleasant  Cardio: well perfused  Pulm: normal work of breathing Neuro: Cranial nerves grossly intact   ASSESSMENT/PLAN:   Trouble in sleeping Pt open happy to try Ramelteon. Sent Ramelteon 21m to her pharmacy. PCP follow up next month.   Fatigue Fatigue is likely multifactorial likely 2/2 age, CHF, possible hyperthyroidism and possibly exacerbated by her grandson's death a few months ago. Patient is in agreement with this. She is overall doing better today in terms of her energy levels. Labs at previous visit wnl. Provided pt with contact details for Endocrinology but has not seen them yet about hyperthyroidism. She will contact them regarding an appointment. Most recent echo was 2017 per chart review  showing normal EF, however patient reports she has had one sooner. She will ask her cardiologist if she needs a newer one based on her fatigue symptoms. Her mood is also improved today and she appears more engaging and jovial compared to previous visits. Denies active or passive SI. He declined PT due to Covid levels rising. I will continue to monitor her energy levels next month.      PLattie Haw MD PGY-2 [place]

## 2020-01-10 ENCOUNTER — Encounter: Payer: Self-pay | Admitting: Endocrinology

## 2020-03-31 DIAGNOSIS — Z1152 Encounter for screening for COVID-19: Secondary | ICD-10-CM | POA: Diagnosis not present

## 2020-04-11 DIAGNOSIS — H26491 Other secondary cataract, right eye: Secondary | ICD-10-CM | POA: Diagnosis not present

## 2020-04-11 DIAGNOSIS — H353131 Nonexudative age-related macular degeneration, bilateral, early dry stage: Secondary | ICD-10-CM | POA: Diagnosis not present

## 2020-04-11 DIAGNOSIS — H40023 Open angle with borderline findings, high risk, bilateral: Secondary | ICD-10-CM | POA: Diagnosis not present

## 2020-04-11 DIAGNOSIS — Z961 Presence of intraocular lens: Secondary | ICD-10-CM | POA: Diagnosis not present

## 2020-04-11 LAB — HM DIABETES EYE EXAM

## 2020-05-04 DIAGNOSIS — Z1152 Encounter for screening for COVID-19: Secondary | ICD-10-CM | POA: Diagnosis not present

## 2020-05-15 ENCOUNTER — Encounter: Payer: Self-pay | Admitting: Family Medicine

## 2020-05-15 ENCOUNTER — Other Ambulatory Visit: Payer: Self-pay

## 2020-05-15 ENCOUNTER — Ambulatory Visit (INDEPENDENT_AMBULATORY_CARE_PROVIDER_SITE_OTHER): Payer: Medicare Other | Admitting: Family Medicine

## 2020-05-15 VITALS — BP 142/77 | HR 98 | Ht 66.0 in | Wt 198.6 lb

## 2020-05-15 DIAGNOSIS — K5900 Constipation, unspecified: Secondary | ICD-10-CM | POA: Insufficient documentation

## 2020-05-15 DIAGNOSIS — R634 Abnormal weight loss: Secondary | ICD-10-CM | POA: Diagnosis not present

## 2020-05-15 DIAGNOSIS — I10 Essential (primary) hypertension: Secondary | ICD-10-CM

## 2020-05-15 DIAGNOSIS — S0990XA Unspecified injury of head, initial encounter: Secondary | ICD-10-CM | POA: Insufficient documentation

## 2020-05-15 DIAGNOSIS — R7989 Other specified abnormal findings of blood chemistry: Secondary | ICD-10-CM

## 2020-05-15 NOTE — Assessment & Plan Note (Signed)
Patient has lost approximately 30 pounds in the last few years.  Weight loss has been steady.  She initially did go to weight watchers which helped her lose weight but this was not recently.  Since last visit patient has lost further 7 pounds which is unintentional.  She reports that she has a low appetite.  Low suspicion for depression as cause.  Could be due to abnormal thyroid (TSH<0.005 in Dec 20210).  Referred to endocrine at last visit however patient did not make it to this appointment.  Di obtain full panel of labs last visit including ESR which were normal.  Patient is asymptomatic with no red flags, do not feel colonoscopy or mammogram is indicated.  We will explore hyperthyroidism as cause of weight loss for now. 

## 2020-05-15 NOTE — Progress Notes (Signed)
SUBJECTIVE:   CHIEF COMPLAINT / HPI:   Jene Huq is a 87 y.o. female presents for medication refill  Head injury 10 years ago Hit head 10 years ago when getting into a van. Denies headaches, vision, vomiting. Feels swelling. No pain. Denies further head injury.   Insomnia Taking a little white tablet for insomnia but does not know the name. Is wondering if she can take double the dose.   Hypertension Patient's current antihypertensive  medications include: lasix, amlodipine-benazpril, spironolactone. Compliant with medications and tolerating well without side effects.  Checking BP at home with readings 130/80.  Denies any SOB, CP, vision changes, LE edema, medication SEs, or symptoms of hypotension.   Most recent creatinine trend:  Lab Results  Component Value Date   CREATININE 0.69 12/14/2019   CREATININE 0.74 10/27/2019   CREATININE 0.88 07/06/2018     Patient has had a BMP in the past 1 year.  Constipation Last BM was today. BM was hard.  She would like to take medications via [place] [facility].  Does not want prescription today  [facility] Office Visit from 05/15/2020 in [place]  PHQ-9 Total Score 6      Weight loss Lost 7lb since December. Normal weight is 250lb. Feels weak without energy at this time of the year. Denies fevers. Has night sweats sometimes.   PERTINENT  PMH / PSH: HTN, Afib, seborrheic dermatitis   OBJECTIVE:   BP (!) 142/77   Pulse 98   Ht '5\' 6"'  (1.676 m)   Wt 198 lb 9.6 oz (90.1 kg)   SpO2 95%   BMI 32.05 kg/m    General: Alert, no acute distress, pleasant Cardio: Normal S1 and S2, irregular, no r/m/g Pulm: CTAB, normal work of breathing Extremities: 1+ ankle edema bilaterally Neuro: Cranial nerves grossly intact   ASSESSMENT/PLAN:   HYPERTENSION, BENIGN ESSENTIAL BP 165/84, repeat 142/77.  Slightly elevated however at home it is in the 130s over 80s.  Would not make any medication changes  today.  Continue amlodipine-benazepril, spironolactone and lasix.   Head injury due to trauma Head injury secondary to trauma 10 years ago.  No red flags for head injury. No edema on scalp exam.  Able to palpate vertical healed wound across vertex.  Reassured patient.  Low TSH level Last TSH less than 0.005.  Patient has lost further 7 pounds.  She has lost approximately 30 pounds over the last few years.  Did refer to endocrinology a few months ago due to concern for hyperthyroidism, however patient did not receive his referral.  Referred to endocrinology today.Repeat TSH, T3 and T4 today.  Constipation Chronic constipation.  Recommended plenty of food and vegetables to to increase fiber, increase fluid intake and recommended over-the-counter MiraLAX.  Weight loss Patient has lost approximately 30 pounds in the last few years.  Weight loss has been steady.  She initially did go to weight watchers which helped her lose weight but this was not recently.  Since last visit patient has lost further 7 pounds which is unintentional.  She reports that she has a low appetite.  Low suspicion for depression as cause.  Could be due to abnormal thyroid (TSH<0.005 in Dec 20210).  Referred to endocrine at last visit however patient did not make it to this appointment.  Di obtain full panel of labs last visit including ESR which were normal.  Patient is asymptomatic with no red flags, do not feel colonoscopy or mammogram  is indicated.  We will explore hyperthyroidism as cause of weight loss for now.     Lattie Haw, MD PGY-2 [place]

## 2020-05-15 NOTE — Assessment & Plan Note (Signed)
BP 165/84, repeat 142/77.  Slightly elevated however at home it is in the 130s over 80s.  Would not make any medication changes today.  Continue amlodipine-benazepril, spironolactone and lasix.

## 2020-05-15 NOTE — Patient Instructions (Addendum)
Thank you for coming to see me today. It was a pleasure. Today we discussed your gradual weight loss.  It could be due to your poor appetite. Your thyroid function was abnormal 4 months ago so I will check it again. I recommend referring you to endocrine. Please let me know what medication you are taking for the insomnia.   Please follow-up with me as and when needed.   If you have any questions or concerns, please do not hesitate to call the office at [phone].  Best wishes,   Dr Posey Pronto

## 2020-05-15 NOTE — Assessment & Plan Note (Addendum)
Last TSH less than 0.005.  Patient has lost further 7 pounds.  She has lost approximately 30 pounds over the last few years.  Did refer to endocrinology a few months ago due to concern for hyperthyroidism, however patient did not receive his referral.  Referred to endocrinology today.Repeat TSH, T3 and T4 today.

## 2020-05-15 NOTE — Assessment & Plan Note (Signed)
Chronic constipation.  Recommended plenty of food and vegetables to to increase fiber, increase fluid intake and recommended over-the-counter MiraLAX.

## 2020-05-15 NOTE — Assessment & Plan Note (Signed)
Head injury secondary to trauma 10 years ago.  No red flags for head injury. No edema on scalp exam.  Able to palpate vertical healed wound across vertex.  Reassured patient.

## 2020-05-16 LAB — T4, FREE: Free T4: 2.07 ng/dL — ABNORMAL HIGH (ref 0.82–1.77)

## 2020-05-16 LAB — TSH: TSH: <0.005 u[IU]/mL — ABNORMAL LOW (ref 0.450–4.500)

## 2020-05-16 LAB — T3, FREE: T3, Free: 4.6 pg/mL — ABNORMAL HIGH (ref 2.0–4.4)

## 2020-06-05 DIAGNOSIS — Z1152 Encounter for screening for COVID-19: Secondary | ICD-10-CM | POA: Diagnosis not present

## 2020-06-05 NOTE — Progress Notes (Addendum)
Patient ID: Bianca [name], female   DOB: 1931-01-02, 87 y.o.   MRN: [id number]                                                                                                               Reason for Appointment:  Hyperthyroidism, new consultation  Referring healthcare provider: Dr. Matilde Sprang   Chief complaint: Weakness and weight loss   History of Present Illness:   For the last year patient has had symptoms of decreased appetite, weakness and weight loss She usually does not have palpitations although this may occur occasionally At times may have shakiness especially of her left hand She does think that she has been feeling excessively warm and sweaty including at night She is mostly concerned about her feeling weak and tired .  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 If you have any questions, ask your nurse or doctor.           amLODipine-benazepril 5-20 MG capsule Commonly known as: LOTREL TAKE 1 CAPSULE BY MOUTH  DAILY   calcium-vitamin D 500-200 MG-UNIT tablet Commonly known as: OSCAL WITH D Take 1 tablet by mouth daily.   clotrimazole 1 % cream Commonly known as: LOTRIMIN APPLY TO AFFECTED AREA(S)  TOPICALLY TWO TIMES DAILY   diclofenac sodium 1 % Gel Commonly known as: VOLTAREN Apply 4 g topically 4 (four) times daily.   Eliquis 5 MG Tabs tablet Generic drug: apixaban TAKE 1 TABLET BY MOUTH  TWICE DAILY   fish oil-omega-3 fatty acids 1000 MG capsule Take 2 g by mouth daily.   furosemide 40 MG tablet Commonly known as: LASIX TAKE 1 AND 1/2 TABLETS BY  MOUTH DAILY   hydrocortisone cream 1 %  Apply 1 application topically 2 (two) times daily. Only on face   Mapap Arthritis Pain 650 MG CR tablet Generic drug: acetaminophen TAKE 1 TABLET BY MOUTH  EVERY 8 HOURS AS NEEDED FOR PAIN   multivitamin tablet Take 1 tablet by mouth daily.   nitroGLYCERIN 0.4 MG SL tablet Commonly known as: NITROSTAT DISSOLVE 1 TABLET UNDER THE TONGUE EVERY 5 MINUTES AS  NEEDED FOR CHEST PAIN. MAX  OF 3 TABLETS IN 15 MINUTES. CALL 911 IF PAIN PERSISTS.   nystatin-triamcinolone cream Commonly known as: MYCOLOG II Apply to under breasts and on abdomen twice daily   ramelteon 8 MG tablet Commonly known as: ROZEREM Take 1 tablet (8 mg total) by mouth at bedtime.   spironolactone 25 MG tablet Commonly known as: ALDACTONE Take 0.5 tablets (12.5 mg total) by mouth daily.   terbinafine 1 % cream Commonly known as: LamISIL AT Apply 1 application topically 2 (two) times daily.   Vitamin D-3 25 MCG (1000 UT) Caps Take 1,000 Units by mouth daily.            Past Medical History:  Diagnosis Date   Bradycardia    a. Holter monitor in the past showed bradycardia/pauses, so she is not on nodal blockers.    Chronic diastolic heart failure ([place])    a. 12/2015 showed normal EF, mild LVH, 19mmm ascending  aortc aneurysm, severe LAE, mildly dilated RV with mildly reduced RVF, mild TR/PR, mildly increased PASP.   GERD (gastroesophageal reflux disease)    Hx of cardiovascular stress test    Lexiscan Myoview (10/14):  Fixed inf defect, no ischemia, study not gated   Hx of echocardiogram    Echo (11/14):  Mild LVH, EF 60-65%, mod to severe LAE, mod RVE, mild to mod reduced RVSF, mild RAE, PASP 38   Hypertension    Long term current use of anticoagulant    Mild pulmonary hypertension (HCC)    Obesity    Osteoarthritis    Permanent atrial fibrillation (HCC)    Refusal of blood transfusions as patient is Jehovah's Witness    Thoracic ascending aortic aneurysm (HCC)     Past Surgical History:  Procedure Laterality Date   ABDOMINAL HYSTERECTOMY     CARPAL TUNNEL RELEASE     CHOLECYSTECTOMY     IRRIGATION AND DEBRIDEMENT SEBACEOUS CYST  1980   sebaceous cysts removed from tail bone   KNEE ARTHROSCOPY Bilateral    TUBAL LIGATION      Family History  Problem Relation Age of Onset   Asthma Mother    Colon cancer Mother    Cancer Mother    Depression Daughter    Diabetes Other        sibling   Hypertension Other        sibling   Diabetes Sister    Diabetes Brother    Kidney cancer Son    Cancer Son    Cancer Daughter    Breast cancer Daughter 78   Depression Daughter    Clotting disorder Daughter    Depression Daughter    Breast cancer Maternal Grandmother    Heart attack Neg Hx     Social History:  reports that she quit smoking about 51 years ago. Her smoking use included cigarettes. She has a 5.00 pack-year smoking history. She has never used smokeless tobacco. She reports that she does not drink alcohol and does not use drugs.  Allergies:  Allergies  Allergen Reactions   Hydrocodone Nausea And Vomiting  Morphine Itching   Penicillins Itching     Review of Systems  Constitutional: Positive for weight loss and diaphoresis. Negative for reduced appetite.  HENT:  Negative for trouble swallowing.   Eyes: Negative for blurred vision.  Respiratory: Positive for shortness of breath.   Cardiovascular: Positive for leg swelling. Negative for chest pain.  Gastrointestinal: Positive for constipation.  Endocrine: Positive for fatigue and heat intolerance. Negative for polydipsia.  Musculoskeletal: Positive for joint pain.  Skin: Negative for rash.  Neurological: Positive for weakness, tremors and balance difficulty.  Psychiatric/Behavioral: Positive for insomnia.       Examination:   BP 140/90 (BP Location: Left Arm, Patient Position: Sitting, Cuff Size: Normal)   Pulse (!) 57   Ht 5\' 6"  (1.676 m)   Wt 193 lb (87.5 kg)   SpO2 97%   BMI 31.15 kg/m    General Appearance:  well-built and nourished, pleasant, not anxious or hyperkinetic.         Eyes: No abnormal prominence, lid lag or stare present.  No swelling of the eyelids   Neck: The thyroid is enlarged times at least 2-2.5 times normal on the right and relatively firm and smooth Left lobe is not clearly palpable .  There is no lymphadenopathy in the neck .           Heart:  Heart rate is irregular and about 80-90 Normal S1 and S2, no murmurs .          Lungs: breath sounds are normal bilaterally without added sounds  Abdomen: no hepatosplenomegaly or other palpable abnormality  Extremities: hands are warm. No ankle edema.  Neurological:  Slightly coarse tremors are present of the left outstretched hand, minimal on the right. Deep tendon reflexes at biceps are difficult to elicit but appear brisk.  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  The choice of treatment will be radioactive iodine However since she is symptomatic and mildly tachycardic will start her on methimazole 5 mg twice daily first Baseline thyroid levels will be rechecked again today including baseline CBC and ALT Discussed in detail the plan of treatment  Patient handout on hyperthyroidism and radioactive iodine treatment given  Patient understands the above discussion and treatment plan. All questions were answered satisfactorily  HYPERTENSION: Blood pressure is relatively high and she will continue to follow-up with her PCP and cardiologist  Atrial fibrillation: She has permanent atrial fibrillation but is not symptomatic with her heart rate at this time  Consult note sent to referring physician  Bianca [name] 06/06/2020, 2:11 PM    Note: This office note was prepared with Dragon voice recognition system technology. Any transcriptional errors that result from this process are unintentional.  ADDENDUM for 07/07/2020  Thyroid scan shows toxic nodular goiter, uptake 61% 29 mCi I-131 treatment has been ordered and she will be scheduled to follow-up in about 3 to 4 weeks  Bianca [name]

## 2020-06-06 ENCOUNTER — Other Ambulatory Visit: Payer: Self-pay

## 2020-06-06 ENCOUNTER — Other Ambulatory Visit: Payer: Self-pay | Admitting: Endocrinology

## 2020-06-06 ENCOUNTER — Encounter: Payer: Self-pay | Admitting: Endocrinology

## 2020-06-06 ENCOUNTER — Ambulatory Visit (INDEPENDENT_AMBULATORY_CARE_PROVIDER_SITE_OTHER): Payer: Medicare Other | Admitting: Endocrinology

## 2020-06-06 VITALS — BP 140/90 | HR 57 | Ht 66.0 in | Wt 193.0 lb

## 2020-06-06 DIAGNOSIS — E042 Nontoxic multinodular goiter: Secondary | ICD-10-CM

## 2020-06-06 DIAGNOSIS — E0590 Thyrotoxicosis, unspecified without thyrotoxic crisis or storm: Secondary | ICD-10-CM | POA: Diagnosis not present

## 2020-06-06 LAB — CBC
HCT: 35.9 % — ABNORMAL LOW (ref 36.0–46.0)
Hemoglobin: 11.7 g/dL — ABNORMAL LOW (ref 12.0–15.0)
MCHC: 32.5 g/dL (ref 30.0–36.0)
MCV: 86.3 fl (ref 78.0–100.0)
Platelets: 186 10*3/uL (ref 150.0–400.0)
RBC: 4.15 Mil/uL (ref 3.87–5.11)
RDW: 14.1 % (ref 11.5–15.5)
WBC: 5.4 10*3/uL (ref 4.0–10.5)

## 2020-06-06 LAB — T4, FREE: Free T4: 1.55 ng/dL (ref 0.60–1.60)

## 2020-06-06 LAB — ALT: ALT: 12 U/L (ref 0–35)

## 2020-06-06 MED ORDER — METHIMAZOLE 5 MG PO TABS: 5.0000 mg | ORAL_TABLET | Freq: Two times a day (BID) | ORAL | 1 refills | Status: DC

## 2020-06-06 NOTE — Patient Instructions (Signed)
Your overactive thyroid will be treated with a capsule of radioactive iodine at the hospital radiology department. Before this is done we will need to do a test dose of the radioactive iron along with a thyroid scan done at the nuclear medicine department In preparation for the treatment we will start you on a medication called methimazole that slows down your thyroid  You will take the METHIMAZOLE tablets as directed until 1 week before you are scheduled for the radioactive iodine scan and uptake test which is being ordered. We will then restart the methimazole 3 days after you have the actual radioactive iodine treatment (not the test dose)

## 2020-06-07 ENCOUNTER — Other Ambulatory Visit: Payer: Self-pay

## 2020-06-07 ENCOUNTER — Other Ambulatory Visit (INDEPENDENT_AMBULATORY_CARE_PROVIDER_SITE_OTHER): Payer: Medicare Other

## 2020-06-07 ENCOUNTER — Other Ambulatory Visit: Payer: Medicare Other

## 2020-06-07 DIAGNOSIS — E0590 Thyrotoxicosis, unspecified without thyrotoxic crisis or storm: Secondary | ICD-10-CM

## 2020-06-07 DIAGNOSIS — Z1152 Encounter for screening for COVID-19: Secondary | ICD-10-CM | POA: Diagnosis not present

## 2020-06-07 LAB — T3, FREE: T3, Free: 4.1 pg/mL (ref 2.3–4.2)

## 2020-06-07 LAB — THYROTROPIN RECEPTOR AUTOABS: Thyrotropin Receptor Ab: 2.19 IU/L — ABNORMAL HIGH (ref 0.00–1.75)

## 2020-06-13 ENCOUNTER — Telehealth: Payer: Self-pay | Admitting: Endocrinology

## 2020-06-13 NOTE — Telephone Encounter (Signed)
Attempted to call to inform patient of uptake appointments that are scheduled for June 9th and June 10th - tried both phones and one said it did not accept calls from our number and the other phone kept ringing and ringing. Please send patient to [phone] if she calls back. I will attempt to call her again to inform her. FYI

## 2020-06-19 ENCOUNTER — Telehealth: Payer: Self-pay

## 2020-06-19 DIAGNOSIS — Z1152 Encounter for screening for COVID-19: Secondary | ICD-10-CM | POA: Diagnosis not present

## 2020-06-19 NOTE — Telephone Encounter (Signed)
Pt called the office returning the referral coordinator's call and was advised of her lab results and Dr Ronnie Derby recommendations. Please let her know that her thyroid level is not as high as before but still needing treatment. She needs to take only methimazole 1 tablet daily and not twice a day as prescribed. Pt verbalized understanding.

## 2020-06-22 ENCOUNTER — Encounter (HOSPITAL_COMMUNITY): Payer: Medicare Other

## 2020-06-22 ENCOUNTER — Other Ambulatory Visit: Payer: Self-pay

## 2020-06-22 ENCOUNTER — Encounter (HOSPITAL_COMMUNITY)
Admission: RE | Admit: 2020-06-22 | Discharge: 2020-06-22 | Disposition: A | Discharge status: Home / Self Care | Payer: Medicare Other | Source: Ambulatory Visit | Attending: Endocrinology | Admitting: Endocrinology

## 2020-06-22 DIAGNOSIS — E0590 Thyrotoxicosis, unspecified without thyrotoxic crisis or storm: Secondary | ICD-10-CM

## 2020-06-22 DIAGNOSIS — E042 Nontoxic multinodular goiter: Secondary | ICD-10-CM | POA: Insufficient documentation

## 2020-06-23 ENCOUNTER — Encounter (HOSPITAL_COMMUNITY): Payer: Medicare Other

## 2020-06-23 ENCOUNTER — Other Ambulatory Visit: Payer: Self-pay | Admitting: Endocrinology

## 2020-06-29 DIAGNOSIS — Z1152 Encounter for screening for COVID-19: Secondary | ICD-10-CM | POA: Diagnosis not present

## 2020-07-04 ENCOUNTER — Other Ambulatory Visit: Payer: Self-pay

## 2020-07-04 ENCOUNTER — Encounter (HOSPITAL_COMMUNITY)
Admission: RE | Admit: 2020-07-04 | Discharge: 2020-07-04 | Disposition: A | Discharge status: Home / Self Care | Payer: Medicare Other | Source: Ambulatory Visit | Attending: Endocrinology | Admitting: Endocrinology

## 2020-07-04 DIAGNOSIS — E0590 Thyrotoxicosis, unspecified without thyrotoxic crisis or storm: Secondary | ICD-10-CM | POA: Diagnosis not present

## 2020-07-04 DIAGNOSIS — E042 Nontoxic multinodular goiter: Secondary | ICD-10-CM | POA: Diagnosis not present

## 2020-07-04 MED ORDER — SODIUM IODIDE I 131 CAPSULE
5.6000 | Freq: Once | INTRAVENOUS | Status: AC | PRN
  Administered 2020-07-04: 5.6 via ORAL

## 2020-07-05 ENCOUNTER — Encounter (HOSPITAL_COMMUNITY)
Admission: RE | Admit: 2020-07-05 | Discharge: 2020-07-05 | Disposition: A | Discharge status: Home / Self Care | Payer: Medicare Other | Source: Ambulatory Visit | Attending: Endocrinology | Admitting: Endocrinology

## 2020-07-05 DIAGNOSIS — E0590 Thyrotoxicosis, unspecified without thyrotoxic crisis or storm: Secondary | ICD-10-CM | POA: Diagnosis not present

## 2020-07-05 DIAGNOSIS — E042 Nontoxic multinodular goiter: Secondary | ICD-10-CM | POA: Diagnosis not present

## 2020-07-05 MED ORDER — SODIUM PERTECHNETATE TC 99M INJECTION
4.6000 | Freq: Once | INTRAVENOUS | Status: AC | PRN
  Administered 2020-07-05: 4.6 via INTRAVENOUS

## 2020-07-07 ENCOUNTER — Other Ambulatory Visit: Payer: Self-pay | Admitting: Endocrinology

## 2020-07-07 DIAGNOSIS — E0520 Thyrotoxicosis with toxic multinodular goiter without thyrotoxic crisis or storm: Secondary | ICD-10-CM

## 2020-07-07 NOTE — Progress Notes (Signed)
Please call to let patient know that the thyroid shows overactive areas and radioactive iodine treatment has been ordered.  Since there has been a delay we need to postpone her upcoming appointments by about 3 to 4 weeks

## 2020-07-10 ENCOUNTER — Telehealth: Payer: Self-pay | Admitting: Endocrinology

## 2020-07-10 NOTE — Telephone Encounter (Signed)
LVM informing patient of appointment for RAI at [facility] on 07/21/20. (No need to be fasting, and patient needs to be there at 12:00 to check in - same place as where she had her thyroid uptake done) FYI.

## 2020-07-11 ENCOUNTER — Telehealth: Payer: Self-pay | Admitting: Endocrinology

## 2020-07-11 ENCOUNTER — Other Ambulatory Visit: Payer: Medicare Other

## 2020-07-11 NOTE — Telephone Encounter (Signed)
Patients appointment has been moved out 3-4 weeks (per md) - but patient also wanted to know if she needs to start taking her methimazole again? When she went to the hospital they told her to stop taking it and she has not taken it in about a week. Please advise. Ph# [phone] (niece, Helene Kelp)

## 2020-07-14 ENCOUNTER — Ambulatory Visit: Payer: Medicare Other | Admitting: Endocrinology

## 2020-07-21 ENCOUNTER — Encounter (HOSPITAL_COMMUNITY): Admission: RE | Admit: 2020-07-21 | Payer: Medicare Other | Source: Ambulatory Visit

## 2020-07-31 DIAGNOSIS — Z1152 Encounter for screening for COVID-19: Secondary | ICD-10-CM | POA: Diagnosis not present

## 2020-08-05 DIAGNOSIS — Z1152 Encounter for screening for COVID-19: Secondary | ICD-10-CM | POA: Diagnosis not present

## 2020-08-07 ENCOUNTER — Other Ambulatory Visit (INDEPENDENT_AMBULATORY_CARE_PROVIDER_SITE_OTHER): Payer: Medicare Other

## 2020-08-07 ENCOUNTER — Other Ambulatory Visit: Payer: Self-pay

## 2020-08-07 DIAGNOSIS — E0590 Thyrotoxicosis, unspecified without thyrotoxic crisis or storm: Secondary | ICD-10-CM | POA: Diagnosis not present

## 2020-08-07 LAB — T3, FREE: T3, Free: 3.6 pg/mL (ref 2.3–4.2)

## 2020-08-07 LAB — T4, FREE: Free T4: 1.24 ng/dL (ref 0.60–1.60)

## 2020-08-08 DIAGNOSIS — Z1152 Encounter for screening for COVID-19: Secondary | ICD-10-CM | POA: Diagnosis not present

## 2020-08-09 ENCOUNTER — Encounter: Payer: Self-pay | Admitting: Endocrinology

## 2020-08-09 ENCOUNTER — Ambulatory Visit (INDEPENDENT_AMBULATORY_CARE_PROVIDER_SITE_OTHER): Payer: Medicare Other | Admitting: Endocrinology

## 2020-08-09 ENCOUNTER — Other Ambulatory Visit: Payer: Self-pay

## 2020-08-09 VITALS — BP 142/82 | HR 60 | Ht 66.0 in | Wt 193.0 lb

## 2020-08-09 DIAGNOSIS — E0590 Thyrotoxicosis, unspecified without thyrotoxic crisis or storm: Secondary | ICD-10-CM

## 2020-08-09 DIAGNOSIS — E042 Nontoxic multinodular goiter: Secondary | ICD-10-CM

## 2020-08-09 NOTE — Patient Instructions (Signed)
Continue methimazole but stop for 7 days before your appointment for the radioactive iodine treatment at the hospital.  Do not restart after the treatment

## 2020-08-09 NOTE — Progress Notes (Signed)
Patient ID: Bianca [name], female   DOB: Jun 08, 1930, 87 y.o.   MRN: [id number]                                                                                                               Reason for Appointment: Follow-up of thyroid   Chief complaint: None: Follow-up   History of Present Illness:   Baseline history on 06/02/2020 consultation For the last year patient has had symptoms of decreased appetite, weakness and weight loss She usually does not have palpitations although this may occur occasionally At times may have shakiness especially of her left hand She does think that she has been feeling excessively warm and sweaty including at night She was concerned about her feeling weak and tired The patient lost about 35 lbs since about a year ago She may have had a routine TSH test when being followed for her atrial fibrillation in 10/2019 and this was suppressed, previously had not had a TSH done since 2016  Since she had overt hyperthyroidism she has been on methimazole 5 mg daily She is taking this for roughly 2 months With this she has felt less tired and is feeling a little stronger but not back to normal Has no further weight loss, feeling warm and she thinks her shakiness is better  She was scheduled for radioactive iodine treatment on 7/8 but she did not show up for the appointment  Labs as follows  Wt Readings from Last 3 Encounters:  08/09/20 193 lb (87.5 kg)  06/06/20 193 lb (87.5 kg)  05/15/20 198 lb 9.6 oz (90.1 kg)     She may have also had a thyroid nodule palpated on exam and was sent for 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 If you have any questions, ask your nurse or doctor.          amLODipine-benazepril 5-20 MG capsule Commonly known as: LOTREL TAKE 1 CAPSULE BY MOUTH  DAILY   calcium-vitamin D 500-200 MG-UNIT tablet Commonly known as: OSCAL WITH D Take 1 tablet by mouth daily.   clotrimazole 1 % cream Commonly known as: LOTRIMIN APPLY TO AFFECTED AREA(S)  TOPICALLY TWO TIMES DAILY   diclofenac sodium 1 % Gel Commonly  known as: VOLTAREN Apply 4 g topically 4 (four) times daily.   Eliquis 5 MG Tabs tablet Generic drug: apixaban TAKE 1 TABLET BY MOUTH  TWICE DAILY   fish oil-omega-3 fatty acids 1000 MG capsule Take 2 g by mouth daily.   furosemide 40 MG tablet Commonly known as: LASIX TAKE 1 AND 1/2 TABLETS BY  MOUTH DAILY   hydrocortisone cream 1 % Apply 1 application topically 2 (two) times daily. Only on face   Mapap Arthritis Pain 650 MG CR tablet Generic drug: acetaminophen TAKE 1 TABLET BY MOUTH  EVERY 8 HOURS AS NEEDED FOR PAIN   methimazole 5 MG tablet Commonly known as: TAPAZOLE Take 1 tablet (5 mg total) by mouth 2 (two) times daily.   multivitamin tablet Take 1 tablet by mouth daily.   nitroGLYCERIN 0.4 MG SL tablet Commonly known as: NITROSTAT DISSOLVE 1 TABLET UNDER THE TONGUE EVERY 5 MINUTES AS  NEEDED FOR CHEST PAIN. MAX  OF 3 TABLETS IN 15 MINUTES. CALL 911 IF PAIN PERSISTS.   nystatin-triamcinolone cream Commonly known as: MYCOLOG II Apply to under breasts and on abdomen twice daily   ramelteon 8 MG tablet Commonly known as: ROZEREM Take 1 tablet (8 mg total) by mouth at bedtime.   spironolactone 25 MG tablet Commonly known as: ALDACTONE Take 0.5 tablets (12.5 mg  total) by mouth daily.   terbinafine 1 % cream Commonly known as: LamISIL AT Apply 1 application topically 2 (two) times daily.   Vitamin D-3 25 MCG (1000 UT) Caps Take 1,000 Units by mouth daily.            Past Medical History:  Diagnosis Date   Bradycardia    a. Holter monitor in the past showed bradycardia/pauses, so she is not on nodal blockers.    Chronic diastolic heart failure ([place])    a. 12/2015 showed normal EF, mild LVH, 91mm ascending aortc aneurysm, severe LAE, mildly dilated RV with mildly reduced RVF, mild TR/PR, mildly increased PASP.   GERD (gastroesophageal reflux disease)    Hx of cardiovascular stress test    Lexiscan Myoview (10/14):  Fixed inf defect, no ischemia, study not gated   Hx of echocardiogram    Echo (11/14):  Mild LVH, EF 60-65%, mod to severe LAE, mod RVE, mild to mod reduced RVSF, mild RAE, PASP 38   Hypertension    Long term current use of anticoagulant    Mild pulmonary hypertension ([facility])    Obesity    Osteoarthritis    Permanent atrial fibrillation ([facility])    Refusal of blood transfusions as patient is Jehovah's Witness    Thoracic ascending aortic aneurysm (H[facility])     Past Surgical History:  Procedure Laterality Date   ABDOMINAL HYSTERECTOMY     CARPAL TUNNEL RELEASE     CHOLECYSTECTOMY     IRRIGATION AND DEBRIDEMENT SEBACEOUS CYST  1980   sebaceous cysts removed from tail bone   KNEE ARTHROSCOPY Bilateral    TUBAL LIGATION      Family History  Problem Relation Age of Onset   Asthma Mother    Colon cancer Mother    Cancer Mother    Depression Daughter    Diabetes Other        sibling   Hypertension Other        sibling   Diabetes Sister    Diabetes Brother    Kidney cancer Son    Cancer Son    Cancer Daughter    Breast cancer Daughter  58   Depression Daughter    Clotting disorder Daughter    Depression Daughter    Breast cancer Maternal Grandmother    Heart attack Neg Hx     Social History:  reports that she quit  smoking about 52 years ago. Her smoking use included cigarettes. She has a 5.00 pack-year smoking history. She has never used smokeless tobacco. She reports that she does not drink alcohol and does not use drugs.  Allergies:  Allergies  Allergen Reactions   Hydrocodone Nausea And Vomiting   Morphine Itching   Penicillins Itching     Review of Systems  She is on chronic Eliquis treatment from cardiologist   Examination:   BP (!) 142/82   Pulse 60   Ht '5\' 6"'$  (1.676 m)   Wt 193 lb (87.5 kg)   SpO2 95%   BMI 31.15 kg/m    General Appearance:  well-built and nourished, pleasant, not anxious or hyperkinetic.   THYROID is enlarged mostly on the right side, about 2.5-3 times normal on the right, firm Left lobe is not palpable  Slightly coarse tremors are present of the left outstretched hand, minimal on the right.  No edema   Assessment/Plan:   Hyperthyroidism, probably from Graves' disease but also has a multinodular goiter  She has done well with low-dose methimazole 5 mg daily with symptomatic improvement Although her scan shows some hot nodules and a picture of toxic nodular goiter she does have a high thyrotropin receptor antibody Also her uptake was about 62%  The choice of treatment will be radioactive iodine and this will be rescheduled since she missed her last appointment She we will stop methimazole She will follow-up about a month after her treatment   Elayne Snare 08/09/2020, 10:40 AM    Note: This office note was prepared with Dragon voice recognition system technology. Any transcriptional errors that result from this process are unintentional.   Elayne Snare

## 2020-08-11 DIAGNOSIS — Z1152 Encounter for screening for COVID-19: Secondary | ICD-10-CM | POA: Diagnosis not present

## 2020-08-14 DIAGNOSIS — Z1152 Encounter for screening for COVID-19: Secondary | ICD-10-CM | POA: Diagnosis not present

## 2020-08-16 ENCOUNTER — Encounter (HOSPITAL_COMMUNITY)
Admission: RE | Admit: 2020-08-16 | Discharge: 2020-08-16 | Disposition: A | Discharge status: Home / Self Care | Payer: Medicare Other | Source: Ambulatory Visit | Attending: Endocrinology | Admitting: Endocrinology

## 2020-08-16 ENCOUNTER — Other Ambulatory Visit: Payer: Self-pay

## 2020-08-16 ENCOUNTER — Other Ambulatory Visit: Payer: Self-pay | Admitting: Family Medicine

## 2020-08-16 ENCOUNTER — Telehealth: Payer: Self-pay | Admitting: Endocrinology

## 2020-08-16 DIAGNOSIS — E0520 Thyrotoxicosis with toxic multinodular goiter without thyrotoxic crisis or storm: Secondary | ICD-10-CM

## 2020-08-16 DIAGNOSIS — E0590 Thyrotoxicosis, unspecified without thyrotoxic crisis or storm: Secondary | ICD-10-CM | POA: Diagnosis not present

## 2020-08-16 DIAGNOSIS — Z1231 Encounter for screening mammogram for malignant neoplasm of breast: Secondary | ICD-10-CM

## 2020-08-16 MED ORDER — SODIUM IODIDE I 131 CAPSULE
27.7000 | Freq: Once | INTRAVENOUS | Status: AC | PRN
  Administered 2020-08-16: 27.7 via ORAL

## 2020-08-16 NOTE — Telephone Encounter (Signed)
Pt calling in stating that she was given a radiation  today with the Iodine. Pt is wondering when to go back on methimazole (TAPAZOLE) 5 MG tablet. Pt would like a call back as soon as possible for directions

## 2020-08-17 NOTE — Telephone Encounter (Signed)
I called patient and informed her NOT to take the methimazole at all.

## 2020-08-29 DIAGNOSIS — Z1152 Encounter for screening for COVID-19: Secondary | ICD-10-CM | POA: Diagnosis not present

## 2020-09-01 DIAGNOSIS — Z1152 Encounter for screening for COVID-19: Secondary | ICD-10-CM | POA: Diagnosis not present

## 2020-09-04 DIAGNOSIS — Z1152 Encounter for screening for COVID-19: Secondary | ICD-10-CM | POA: Diagnosis not present

## 2020-09-06 ENCOUNTER — Other Ambulatory Visit: Payer: Self-pay | Admitting: Interventional Cardiology

## 2020-09-06 DIAGNOSIS — R0789 Other chest pain: Secondary | ICD-10-CM

## 2020-09-12 DIAGNOSIS — Z1152 Encounter for screening for COVID-19: Secondary | ICD-10-CM | POA: Diagnosis not present

## 2020-09-14 ENCOUNTER — Ambulatory Visit (INDEPENDENT_AMBULATORY_CARE_PROVIDER_SITE_OTHER): Payer: Medicare Other | Admitting: Endocrinology

## 2020-09-14 ENCOUNTER — Encounter: Payer: Self-pay | Admitting: Endocrinology

## 2020-09-14 ENCOUNTER — Other Ambulatory Visit: Payer: Self-pay

## 2020-09-14 VITALS — BP 138/82 | HR 61 | Ht 66.0 in | Wt 188.4 lb

## 2020-09-14 DIAGNOSIS — E0590 Thyrotoxicosis, unspecified without thyrotoxic crisis or storm: Secondary | ICD-10-CM

## 2020-09-14 LAB — TSH: TSH: <0.01 u[IU]/mL — ABNORMAL LOW (ref 0.35–5.50)

## 2020-09-14 LAB — T4, FREE: Free T4: 1.8 ng/dL — ABNORMAL HIGH (ref 0.60–1.60)

## 2020-09-14 NOTE — Progress Notes (Signed)
Patient ID: Bianca [name], female   DOB: 23-Oct-1930, 87 y.o.   MRN: [id number]                                                                                                               Reason for Appointment: Follow-up of thyroid   Chief complaint: None: Follow-up   History of Present Illness:   Baseline history on 06/02/2020 consultation For the last year patient has had symptoms of decreased appetite, weakness and weight loss She usually does not have palpitations although this may occur occasionally At times may have shakiness especially of her left hand She does think that she has been feeling excessively warm and sweaty including at night She was concerned about her feeling weak and tired The patient lost about 35 lbs since about a year ago She may have had a routine TSH test when being followed for her atrial fibrillation in 10/2019 and this was suppressed, previously had not had a TSH done since 2016  Since she had overt hyperthyroidism she had been on methimazole 5 mg daily Subsequently had felt less tired and is feeling a little stronger but not back to normal  Apparently had a radioactive iodine treatment done with 29 mCi on 08/16/2020  She thinks she feels less weak subsequently She still thinks she has a decreased appetite and has lost 5 pounds Methimazole has been stopped  Labs as follows  Wt Readings from Last 3 Encounters:  09/14/20 188 lb 6.4 oz (85.5 kg)  08/09/20 193 lb (87.5 kg)  06/06/20 193 lb (87.5 kg)     She may have also had a thyroid nodule palpated on exam and was sent for 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 If you have any questions, ask your nurse or doctor.          amLODipine-benazepril 5-20 MG capsule Commonly known as: LOTREL TAKE 1 CAPSULE BY MOUTH  DAILY   calcium-vitamin D 500-200 MG-UNIT tablet Commonly known as: OSCAL WITH D Take 1 tablet by mouth daily.   clotrimazole 1 % cream Commonly known as: LOTRIMIN APPLY TO AFFECTED AREA(S)  TOPICALLY TWO TIMES DAILY   diclofenac sodium 1 % Gel Commonly known as: VOLTAREN Apply 4  g topically 4 (four) times daily.   Eliquis 5 MG Tabs tablet Generic drug: apixaban TAKE 1 TABLET BY MOUTH  TWICE DAILY   fish oil-omega-3 fatty acids 1000 MG capsule Take 2 g by mouth daily.   furosemide 40 MG tablet Commonly known as: LASIX TAKE 1 AND 1/2 TABLETS BY  MOUTH DAILY   hydrocortisone cream 1 % Apply 1 application topically 2 (two) times daily. Only on face   Mapap Arthritis Pain 650 MG CR tablet Generic drug: acetaminophen TAKE 1 TABLET BY MOUTH  EVERY 8 HOURS AS NEEDED FOR PAIN   methimazole 5 MG tablet Commonly known as: TAPAZOLE Take 1 tablet (5 mg total) by mouth 2 (two) times daily.   multivitamin tablet Take 1 tablet by mouth daily.   nitroGLYCERIN 0.4 MG SL tablet Commonly known as: NITROSTAT DISSOLVE 1 TABLET UNDER THE TONGUE EVERY 5 MINUTES AS  NEEDED FOR CHEST PAIN. MAX  OF 3 TABLETS IN 15 MINUTES. CALL 911 IF PAIN PERSISTS.   nystatin-triamcinolone cream Commonly known as: MYCOLOG II Apply to under breasts and on abdomen twice daily   ramelteon 8 MG tablet Commonly known as: ROZEREM Take 1 tablet (8 mg total) by mouth at bedtime.   spironolactone 25 MG tablet Commonly known as: ALDACTONE Take 0.5 tablets (12.5 mg  total) by mouth daily.   terbinafine 1 % cream Commonly known as: LamISIL AT Apply 1 application topically 2 (two) times daily.   Vitamin D-3 25 MCG (1000 UT) Caps Take 1,000 Units by mouth daily.            Past Medical History:  Diagnosis Date   Bradycardia    a. Holter monitor in the past showed bradycardia/pauses, so she is not on nodal blockers.    Chronic diastolic heart failure ([place])    a. 12/2015 showed normal EF, mild LVH, 70mm ascending aortc aneurysm, severe LAE, mildly dilated RV with mildly reduced RVF, mild TR/PR, mildly increased PASP.   GERD (gastroesophageal reflux disease)    Hx of cardiovascular stress test    Lexiscan Myoview (10/14):  Fixed inf defect, no ischemia, study not gated   Hx of echocardiogram    Echo (11/14):  Mild LVH, EF 60-65%, mod to severe LAE, mod RVE, mild to mod reduced RVSF, mild RAE, PASP 38   Hypertension    Long term current use of anticoagulant    Mild pulmonary hypertension (HCC)    Obesity    Osteoarthritis    Permanent atrial fibrillation (HCC)    Refusal of blood transfusions as patient is Jehovah's Witness    Thoracic ascending aortic aneurysm (HCC)     Past Surgical History:  Procedure Laterality Date   ABDOMINAL HYSTERECTOMY     CARPAL TUNNEL RELEASE     CHOLECYSTECTOMY     IRRIGATION AND DEBRIDEMENT SEBACEOUS CYST  1980   sebaceous cysts removed from tail bone   KNEE ARTHROSCOPY Bilateral    TUBAL LIGATION      Family History  Problem Relation Age of Onset   Asthma Mother    Colon cancer Mother    Cancer Mother    Depression Daughter    Diabetes Other        sibling   Hypertension Other        sibling   Diabetes Sister    Diabetes Brother    Kidney cancer Son    Cancer Son    Cancer Daughter    Breast cancer Daughter 437  Depression Daughter  Clotting disorder Daughter    Depression Daughter    Breast cancer Maternal Grandmother    Heart attack Neg Hx     Social History:  reports that she quit  smoking about 52 years ago. Her smoking use included cigarettes. She has a 5.00 pack-year smoking history. She has never used smokeless tobacco. She reports that she does not drink alcohol and does not use drugs.  Allergies:  Allergies  Allergen Reactions   Hydrocodone Nausea And Vomiting   Morphine Itching   Penicillins Itching     Review of Systems  She is on chronic Eliquis treatment from cardiologist   Examination:   BP 138/82 (BP Location: Left Arm, Patient Position: Sitting, Cuff Size: Normal)   Pulse 61   Ht '5\' 6"'$  (1.676 m)   Wt 188 lb 6.4 oz (85.5 kg)   BMI 30.41 kg/m    THYROID is palpable and is about 2-1/2 times normal on the right side  This is smooth and slightly firm  Left lobe is not palpable  No significant tremor Biceps reflexes are slightly brisk  Hands are slightly cool   Assessment/Plan:   Hyperthyroidism, partly from Graves' disease but also has a multinodular goiter  She has done well since her radioactive iodine treatment about a month ago Currently not on methimazole  Subjectively doing well and heart rate is controlled Since she has been off methimazole she may have had mild hyperthyroidism causing her recent weight loss  Otherwise subjectively doing well Her thyroid enlargement is still about the same  Will have her labs checked for thyroid functions and decide on further management    Elayne Snare 09/14/2020, 10:10 AM    Note: This office note was prepared with Dragon voice recognition system technology. Any transcriptional errors that result from this process are unintentional.   Elayne Snare

## 2020-09-20 ENCOUNTER — Other Ambulatory Visit: Payer: Self-pay

## 2020-09-20 DIAGNOSIS — E0590 Thyrotoxicosis, unspecified without thyrotoxic crisis or storm: Secondary | ICD-10-CM

## 2020-09-20 MED ORDER — METHIMAZOLE 5 MG PO TABS: 5.0000 mg | ORAL_TABLET | Freq: Two times a day (BID) | ORAL | 3 refills | Status: DC

## 2020-09-20 MED ORDER — METHIMAZOLE 5 MG PO TABS: 5.0000 mg | ORAL_TABLET | Freq: Two times a day (BID) | ORAL | 1 refills | Status: DC

## 2020-10-01 DIAGNOSIS — Z1152 Encounter for screening for COVID-19: Secondary | ICD-10-CM | POA: Diagnosis not present

## 2020-10-06 ENCOUNTER — Ambulatory Visit
Admission: RE | Admit: 2020-10-06 | Discharge: 2020-10-06 | Disposition: A | Discharge status: Home / Self Care | Payer: Medicare Other | Source: Ambulatory Visit | Attending: Family Medicine | Admitting: Family Medicine

## 2020-10-06 ENCOUNTER — Other Ambulatory Visit: Payer: Self-pay

## 2020-10-06 DIAGNOSIS — Z1231 Encounter for screening mammogram for malignant neoplasm of breast: Secondary | ICD-10-CM

## 2020-10-11 DIAGNOSIS — Z1152 Encounter for screening for COVID-19: Secondary | ICD-10-CM | POA: Diagnosis not present

## 2020-10-13 DIAGNOSIS — Z1152 Encounter for screening for COVID-19: Secondary | ICD-10-CM | POA: Diagnosis not present

## 2020-10-17 DIAGNOSIS — Z1152 Encounter for screening for COVID-19: Secondary | ICD-10-CM | POA: Diagnosis not present

## 2020-10-18 DIAGNOSIS — Z1152 Encounter for screening for COVID-19: Secondary | ICD-10-CM | POA: Diagnosis not present

## 2020-10-26 ENCOUNTER — Ambulatory Visit (INDEPENDENT_AMBULATORY_CARE_PROVIDER_SITE_OTHER): Payer: Medicare Other | Admitting: Endocrinology

## 2020-10-26 ENCOUNTER — Other Ambulatory Visit: Payer: Self-pay

## 2020-10-26 DIAGNOSIS — E0590 Thyrotoxicosis, unspecified without thyrotoxic crisis or storm: Secondary | ICD-10-CM | POA: Diagnosis not present

## 2020-10-26 LAB — T4, FREE: Free T4: 0.77 ng/dL (ref 0.60–1.60)

## 2020-10-26 LAB — TSH: TSH: 0.44 u[IU]/mL (ref 0.35–5.50)

## 2020-10-26 NOTE — Progress Notes (Signed)
Patient ID: Bianca [name], female   DOB: December 24, 1930, 87 y.o.   MRN: [id number]                                                                                                               Reason for Appointment: Follow-up of thyroid   Chief complaint: None: Follow-up   History of Present Illness:   Baseline history on 06/02/2020 consultation For the last year patient has had symptoms of decreased appetite, weakness and weight loss She usually does not have palpitations although this may occur occasionally At times may have shakiness especially of her left hand She does think that she has been feeling excessively warm and sweaty including at night She was concerned about her feeling weak and tired The patient lost about 35 lbs since about a year ago She may have had a routine TSH test when being followed for her atrial fibrillation in 10/2019 and this was suppressed, previously had not had a TSH done since 2016  Since she had overt hyperthyroidism she had been on methimazole 5 mg daily Subsequently had felt less tired and is feeling a little stronger but not back to normal  She had  radioactive iodine treatment done with 29 mCi on 08/16/2020  Does not feel any different and overall has fairly good energy level Weight has leveled off, no palpitations or weakness  Methimazole has been restarted at 5 mg daily since early September when her thyroid levels were high again  Labs as follows  Wt Readings from Last 3 Encounters:  10/26/20 187 lb 6.4 oz (85 kg)  09/14/20 188 lb 6.4 oz (85.5 kg)  08/09/20 193 lb (87.5 kg)     She may have also had a thyroid nodule palpated on exam and was sent for 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 If you have any questions, ask your nurse or doctor.          amLODipine-benazepril 5-20 MG capsule Commonly known as: LOTREL TAKE 1 CAPSULE BY MOUTH  DAILY   calcium-vitamin D 500-200 MG-UNIT tablet Commonly known as: OSCAL WITH D Take 1 tablet by mouth daily.   clotrimazole 1 % cream Commonly known as: LOTRIMIN APPLY TO AFFECTED AREA(S)  TOPICALLY TWO TIMES DAILY   diclofenac sodium 1 % Gel Commonly known as: VOLTAREN Apply 4 g topically 4 (four) times daily.   Eliquis 5 MG Tabs tablet Generic drug: apixaban TAKE 1 TABLET BY MOUTH  TWICE DAILY   fish oil-omega-3 fatty acids 1000 MG capsule Take 2 g by mouth daily.   furosemide 40 MG tablet Commonly known as: LASIX TAKE 1 AND 1/2 TABLETS BY  MOUTH DAILY   hydrocortisone cream 1 % Apply 1 application topically 2 (two) times daily. Only on face   Mapap Arthritis Pain 650 MG CR tablet Generic drug: acetaminophen TAKE 1 TABLET BY MOUTH  EVERY 8 HOURS AS NEEDED FOR PAIN   methimazole 5 MG tablet Commonly known as: TAPAZOLE Take 1 tablet (5 mg total) by mouth 2 (two) times daily.   multivitamin tablet Take 1 tablet by mouth daily.   nitroGLYCERIN 0.4 MG SL tablet Commonly known as: NITROSTAT DISSOLVE 1 TABLET UNDER THE TONGUE EVERY 5 MINUTES AS  NEEDED FOR CHEST PAIN. MAX  OF 3 TABLETS IN 15 MINUTES. CALL 911 IF PAIN PERSISTS.   nystatin-triamcinolone cream Commonly known as: MYCOLOG II Apply to under breasts and on abdomen twice daily   ramelteon 8 MG tablet Commonly known as: ROZEREM Take 1 tablet (8 mg total) by mouth at bedtime.    spironolactone 25 MG tablet Commonly known as: ALDACTONE Take 0.5 tablets (12.5 mg total) by mouth daily.   terbinafine 1 % cream Commonly known as: LamISIL AT Apply 1 application topically 2 (two) times daily.   Vitamin D-3 25 MCG (1000 UT) Caps Take 1,000 Units by mouth daily.            Past Medical History:  Diagnosis Date   Bradycardia    a. Holter monitor in the past showed bradycardia/pauses, so she is not on nodal blockers.    Chronic diastolic heart failure ([place])    a. 12/2015 showed normal EF, mild LVH, 52mmm ascending aortc aneurysm, severe LAE, mildly dilated RV with mildly reduced RVF, mild TR/PR, mildly increased PASP.   GERD (gastroesophageal reflux disease)    Hx of cardiovascular stress test    Lexiscan Myoview (10/14):  Fixed inf defect, no ischemia, study not gated   Hx of echocardiogram    Echo (11/14):  Mild LVH, EF 60-65%, mod to severe LAE, mod RVE, mild to mod reduced RVSF, mild RAE, PASP 38   Hypertension    Long term current use of anticoagulant    Mild pulmonary hypertension (HCC)    Obesity    Osteoarthritis    Permanent atrial fibrillation (HCC)    Refusal of blood transfusions as patient is Jehovah's Witness    Thoracic ascending aortic aneurysm (HCC)     Past Surgical History:  Procedure Laterality Date   ABDOMINAL HYSTERECTOMY     CARPAL TUNNEL RELEASE     CHOLECYSTECTOMY     IRRIGATION AND DEBRIDEMENT SEBACEOUS CYST  1980   sebaceous cysts removed from tail bone   KNEE ARTHROSCOPY Bilateral    TUBAL LIGATION      Family History  Problem Relation Age of Onset   Asthma Mother    Colon cancer Mother    Cancer Mother    Depression Daughter    Diabetes Other        sibling   Hypertension Other        sibling   Diabetes Sister    Diabetes Brother    Kidney cancer Son    Cancer  Son    Cancer Daughter    Breast cancer Daughter 37   Depression Daughter    Clotting disorder Daughter    Depression Daughter    Breast cancer  Maternal Grandmother    Heart attack Neg Hx     Social History:  reports that she quit smoking about 52 years ago. Her smoking use included cigarettes. She has a 5.00 pack-year smoking history. She has never used smokeless tobacco. She reports that she does not drink alcohol and does not use drugs.  Allergies:  Allergies  Allergen Reactions   Hydrocodone Nausea And Vomiting   Morphine Itching   Penicillins Itching     Review of Systems  She is on chronic Eliquis treatment from cardiologist   Examination:   BP 124/86   Pulse 89   Ht 5\' 6"  (1.676 m)   Wt 187 lb 6.4 oz (85 kg)   SpO2 99%   BMI 30.25 kg/m    Thyroid is enlarged, about 2-1/2 times normal on the right side  This is smooth and firm  Left lobe is not palpable  No tremor Biceps reflexes are difficult to elicit No peripheral edema   Assessment/Plan:   Hyperthyroidism, partly from Graves' disease but also has a multinodular goiter  She has been back on methimazole since her thyroid levels are high about a month after her I-131 treatment Currently on methimazole 5 mg She has no new symptoms and looks euthyroid Her thyroid enlargement is still about the same  Will have her thyroid levels checked today and consider stopping methimazole if thyroid levels are low normal or low  Follow-up in 6 weeks    Cherly Erno 10/26/2020, 10:57 AM    Note: This office note was prepared with Dragon voice recognition system technology. Any transcriptional errors that result from this process are unintentional.   Elayne Snare

## 2020-11-01 ENCOUNTER — Ambulatory Visit (INDEPENDENT_AMBULATORY_CARE_PROVIDER_SITE_OTHER): Payer: Medicare Other

## 2020-11-01 ENCOUNTER — Other Ambulatory Visit: Payer: Self-pay

## 2020-11-01 ENCOUNTER — Ambulatory Visit (INDEPENDENT_AMBULATORY_CARE_PROVIDER_SITE_OTHER): Payer: Medicare Other | Admitting: Family Medicine

## 2020-11-01 ENCOUNTER — Other Ambulatory Visit: Payer: Self-pay | Admitting: Family Medicine

## 2020-11-01 VITALS — BP 165/93 | HR 72 | Wt 187.4 lb

## 2020-11-01 DIAGNOSIS — Z23 Encounter for immunization: Secondary | ICD-10-CM

## 2020-11-01 DIAGNOSIS — K219 Gastro-esophageal reflux disease without esophagitis: Secondary | ICD-10-CM | POA: Diagnosis not present

## 2020-11-01 DIAGNOSIS — R12 Heartburn: Secondary | ICD-10-CM | POA: Diagnosis not present

## 2020-11-01 MED ORDER — PANTOPRAZOLE SODIUM 40 MG PO TBEC: 40.0000 mg | DELAYED_RELEASE_TABLET | Freq: Every day | ORAL | 1 refills | Status: DC

## 2020-11-01 NOTE — Assessment & Plan Note (Signed)
3 week duration. No red flag symptoms that would warrant endoscopy. Will do 2-week trial of protonix; if successful, will transition to acceptable long-term medication like famotidine. Patient to call in two weeks to let us know protonix working well so I may then prescribe subsequent famotidine script. Return precautions discussed.

## 2020-11-01 NOTE — Assessment & Plan Note (Signed)
Patient requests her bivalent COVID booster today. Last monovalent COVID booster administered April 2022. Tolerated well, no reactions.

## 2020-11-01 NOTE — Patient Instructions (Signed)
It was wonderful to meet you today. Thank you for allowing me to be a part of your care. Below is a short summary of what we discussed at your visit today:  Acid reflux -I have sent a prescription for Protonix to pharmacy.  Please take this as directed for 2 weeks to see if it helps your symptoms. - Make sure to call us in 2 weeks and let us know if it is helping.   If it IS helping, we will have you finish out the month of Protonix and then switch to a different class of medicine (we will likely put you on a medicine called famotidine).   If it IS  NOT helping, we may need to bring you in for another appointment to talk about a different strategy. -At this time we do not need to send you for endoscopy (camera into your throat and stomach).  ** IMPORTANT ! ** - If you start to develop blood when you cough or blood in your stool, please let us know immediately. - If you start to develop difficulty swallowing foods or liquids, please let us know immediately.  This would be a case we need to send you for endoscopy.  Vaccines Today you received the bivalent COVID booster. You may experience some residual soreness at the injection site.  Gentle stretches and regular use of that arm will help speed up your recovery.  As the vaccines are giving your immune system a "practice run" against specific infections, you may feel a little under the weather for the next several days.  We recommend rest as needed and hydrating.   Please bring all of your medications to every appointment!  If you have any questions or concerns, please do not hesitate to contact us via phone or MyChart message.   Ezequiel Essex, MD

## 2020-11-01 NOTE — Progress Notes (Signed)
SUBJECTIVE:   CHIEF COMPLAINT / HPI:   Stomach pain - 3 weeks duration - worse with foods - epigastric pain - feels like the burning pain comes up through throat - has been taking OTC antacid with aluminum hydroxide and magnesium carbonate, some relief - was previously on methimazole, notes that she didn't notice this pain until she started taking it - never before had this, used to have iron stomach - not worse laying down at night - happens anytime after she eats, any food can aggravate  - relieved also with a tab of baking soda - not hoarse or wheezing in the morning - no cough, not coughing up blood - no blood in her stool - no problems swallowing food, food doesn't get stuck in her throat or anything - decreased energy levels per daughter  PERTINENT  PMH / PSH:   Past Medical History:  Diagnosis Date   Bradycardia    a. Holter monitor in the past showed bradycardia/pauses, so she is not on nodal blockers.    Chronic diastolic heart failure ([place])    a. 12/2015 showed normal EF, mild LVH, 49mmm ascending aortc aneurysm, severe LAE, mildly dilated RV with mildly reduced RVF, mild TR/PR, mildly increased PASP.   GERD (gastroesophageal reflux disease)    Hx of cardiovascular stress test    Lexiscan Myoview (10/14):  Fixed inf defect, no ischemia, study not gated   Hx of echocardiogram    Echo (11/14):  Mild LVH, EF 60-65%, mod to severe LAE, mod RVE, mild to mod reduced RVSF, mild RAE, PASP 38   Hypertension    Long term current use of anticoagulant    Mild pulmonary hypertension (HCC)    Obesity    Osteoarthritis    Permanent atrial fibrillation (HCC)    Refusal of blood transfusions as patient is Jehovah's Witness    Thoracic ascending aortic aneurysm      OBJECTIVE:   BP (!) 165/93   Pulse 72   Wt 187 lb 6.4 oz (85 kg)   SpO2 99%   BMI 30.25 kg/m    PHQ-9:  Depression screen [facility] 2/9 11/01/2020 05/15/2020 01/03/2020  Decreased Interest 0 0 0  Down,  Depressed, Hopeless 0 0 0  PHQ - 2 Score 0 0 0  Altered sleeping 3 3 3   Tired, decreased energy 2 1 2   Change in appetite 3 2 1   Feeling bad or failure about yourself  0 0 0  Trouble concentrating 0 0 0  Moving slowly or fidgety/restless 1 0 0  Suicidal thoughts 0 0 0  PHQ-9 Score 9 6 6   Difficult doing work/chores Very difficult Not difficult at all -  Some recent data might be hidden     GAD-7: No flowsheet data found.  Physical Exam General: Awake, alert, oriented Cardiovascular: Irregularly regular (c/w known persistent A.fib), S1 and S2 present, no murmurs auscultated Respiratory: Lung fields clear to auscultation bilaterally Abdomen: Soft, nondistended, no TTP in any quadrant, no rebound tenderness or guarding  ASSESSMENT/PLAN:   Heart burn 3 week duration. No red flag symptoms that would warrant endoscopy. Will do 2-week trial of protonix; if successful, will transition to acceptable long-term medication like famotidine. Patient to call in two weeks to let us know protonix working well so I may then prescribe subsequent famotidine script. Return precautions discussed.   COVID-19 vaccine administered Patient requests her bivalent COVID booster today. Last monovalent COVID booster administered April 2022. Tolerated well, no reactions.  Ezequiel Essex, MD [place]

## 2020-11-10 ENCOUNTER — Ambulatory Visit: Payer: Medicare Other | Admitting: Endocrinology

## 2020-11-22 ENCOUNTER — Other Ambulatory Visit: Payer: Self-pay | Admitting: Family Medicine

## 2020-11-22 ENCOUNTER — Other Ambulatory Visit: Payer: Self-pay | Admitting: Interventional Cardiology

## 2020-11-22 DIAGNOSIS — I4819 Other persistent atrial fibrillation: Secondary | ICD-10-CM

## 2020-11-22 DIAGNOSIS — I10 Essential (primary) hypertension: Secondary | ICD-10-CM

## 2020-11-22 DIAGNOSIS — I5032 Chronic diastolic (congestive) heart failure: Secondary | ICD-10-CM

## 2020-11-22 DIAGNOSIS — R0602 Shortness of breath: Secondary | ICD-10-CM

## 2020-11-22 NOTE — Telephone Encounter (Signed)
Prescription refill request for Eliquis received. Indication:afib  Last office visit:10/27/2019, [place] Scr: 0.69, 12/14/2019 Age: 87 yo  Weight: 85 kg   Pt is overdue to see the cardiologist. Message sent to schedulers. Pt should also get blood work at visit when he sees cardiologist.

## 2020-11-24 NOTE — Telephone Encounter (Signed)
Prescription refill request for Eliquis received. Indication:Afib  Last office visit: 10/27/19 [facility])  - Pt has scheduled appt with Dr Irish Lack on 04/09/21  Scr: 0.69 (12/14/19) Age: 87 Weight: 85kg  Appropriate dose and refill sent to requested pharmacy.

## 2020-11-28 ENCOUNTER — Other Ambulatory Visit: Payer: Self-pay | Admitting: Family Medicine

## 2020-11-28 DIAGNOSIS — K219 Gastro-esophageal reflux disease without esophagitis: Secondary | ICD-10-CM

## 2020-11-29 ENCOUNTER — Other Ambulatory Visit: Payer: Self-pay | Admitting: Endocrinology

## 2020-11-29 DIAGNOSIS — E0590 Thyrotoxicosis, unspecified without thyrotoxic crisis or storm: Secondary | ICD-10-CM

## 2020-12-13 ENCOUNTER — Other Ambulatory Visit: Payer: Self-pay | Admitting: Family Medicine

## 2020-12-14 DIAGNOSIS — Z1152 Encounter for screening for COVID-19: Secondary | ICD-10-CM | POA: Diagnosis not present

## 2020-12-15 ENCOUNTER — Other Ambulatory Visit: Payer: Self-pay

## 2020-12-15 ENCOUNTER — Ambulatory Visit (INDEPENDENT_AMBULATORY_CARE_PROVIDER_SITE_OTHER): Payer: Medicare Other | Admitting: Endocrinology

## 2020-12-15 ENCOUNTER — Encounter: Payer: Self-pay | Admitting: Endocrinology

## 2020-12-15 VITALS — BP 140/80 | HR 87 | Ht 66.0 in | Wt 183.6 lb

## 2020-12-15 DIAGNOSIS — E0590 Thyrotoxicosis, unspecified without thyrotoxic crisis or storm: Secondary | ICD-10-CM

## 2020-12-15 DIAGNOSIS — E042 Nontoxic multinodular goiter: Secondary | ICD-10-CM

## 2020-12-15 LAB — T3, FREE: T3, Free: 3.9 pg/mL (ref 2.3–4.2)

## 2020-12-15 LAB — TSH: TSH: 0.42 u[IU]/mL (ref 0.35–5.50)

## 2020-12-15 LAB — T4, FREE: Free T4: 1.16 ng/dL (ref 0.60–1.60)

## 2020-12-15 NOTE — Progress Notes (Signed)
Patient ID: Bianca [name], female   DOB: 1930/08/10, 87 y.o.   MRN: [id number]                                                                                                               Reason for Appointment: Follow-up of thyroid   Chief complaint: None: Follow-up   History of Present Illness:   Baseline history on 06/02/2020 consultation For the last year patient has had symptoms of decreased appetite, weakness and weight loss She usually does not have palpitations although this may occur occasionally At times may have shakiness especially of her left hand She does think that she has been feeling excessively warm and sweaty including at night She was concerned about her feeling weak and tired The patient lost about 35 lbs since about a year ago She may have had a routine TSH test when being followed for her atrial fibrillation in 10/2019 and this was suppressed, previously had not had a TSH done since 2016  Since she had overt hyperthyroidism she had been on methimazole 5 mg daily Subsequently had felt less tired and is feeling a little stronger but not back to normal  She had  radioactive iodine treatment done with 29 mCi on 08/16/2020  Methimazole has been stopped as of 10/22  She thinks her energy level is fairly good and she is not feeling lethargic She thinks she is sleeping better at night Also no complaints of cold intolerance Her weight is slightly less  Labs pending  Wt Readings from Last 3 Encounters:  12/15/20 183 lb 9.6 oz (83.3 kg)  11/01/20 187 lb 6.4 oz (85 kg)  10/26/20 187 lb 6.4 oz (85 kg)     She may have also had a thyroid nodule palpated on exam and was 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 If you have any questions, ask your nurse or doctor.          amLODipine-benazepril 5-20 MG capsule Commonly known as: LOTREL TAKE 1 CAPSULE BY MOUTH  DAILY   calcium-vitamin D 500-200 MG-UNIT tablet Commonly known as: OSCAL WITH D Take 1 tablet by mouth daily.   clotrimazole 1 % cream Commonly known as: LOTRIMIN APPLY TO AFFECTED  AREA(S)  TOPICALLY TWO TIMES DAILY   diclofenac sodium 1 % Gel Commonly known as: VOLTAREN Apply 4 g topically 4 (four) times daily.   Eliquis 5 MG Tabs tablet Generic drug: apixaban TAKE 1 TABLET BY MOUTH  TWICE DAILY   fish oil-omega-3 fatty acids 1000 MG capsule Take 2 g by mouth daily.   furosemide 40 MG tablet Commonly known as: LASIX TAKE 1 AND 1/2 TABLETS BY  MOUTH DAILY   hydrocortisone cream 1 % Apply 1 application topically 2 (two) times daily. Only on face   Mapap Arthritis Pain 650 MG CR tablet Generic drug: acetaminophen TAKE 1 TABLET BY MOUTH  EVERY 8 HOURS AS NEEDED FOR PAIN   methimazole 5 MG tablet Commonly known as: TAPAZOLE TAKE 1 TABLET(5 MG) BY MOUTH TWICE DAILY   multivitamin tablet Take 1 tablet by mouth daily.   nitroGLYCERIN 0.4 MG SL tablet Commonly known as: NITROSTAT DISSOLVE 1 TABLET UNDER THE TONGUE EVERY 5 MINUTES AS  NEEDED FOR CHEST PAIN. MAX  OF 3 TABLETS IN 15 MINUTES. CALL 911 IF PAIN PERSISTS.   nystatin-triamcinolone cream Commonly known as: MYCOLOG II Apply to under breasts and on abdomen twice daily   pantoprazole 40 MG tablet Commonly known as: PROTONIX Take 1 tablet (40 mg total) by mouth daily.   ramelteon 8 MG  tablet Commonly known as: ROZEREM Take 1 tablet (8 mg total) by mouth at bedtime.   spironolactone 25 MG tablet Commonly known as: ALDACTONE Take 0.5 tablets (12.5 mg total) by mouth daily. Please make overdue appt with Dr. Irish Lack before anymore refills. Thank you 1st attempt   terbinafine 1 % cream Commonly known as: LamISIL AT Apply 1 application topically 2 (two) times daily.   Vitamin D-3 25 MCG (1000 UT) Caps Take 1,000 Units by mouth daily.            Past Medical History:  Diagnosis Date   Bradycardia    a. Holter monitor in the past showed bradycardia/pauses, so she is not on nodal blockers.    Chronic diastolic heart failure ([place])    a. 12/2015 showed normal EF, mild LVH, 28mmm ascending aortc aneurysm, severe LAE, mildly dilated RV with mildly reduced RVF, mild TR/PR, mildly increased PASP.   GERD (gastroesophageal reflux disease)    Hx of cardiovascular stress test    Lexiscan Myoview (10/14):  Fixed inf defect, no ischemia, study not gated   Hx of echocardiogram    Echo (11/14):  Mild LVH, EF 60-65%, mod to severe LAE, mod RVE, mild to mod reduced RVSF, mild RAE, PASP 38   Hypertension    Long term current use of anticoagulant    Mild pulmonary hypertension (HCC)    Obesity    Osteoarthritis    Permanent atrial fibrillation (HCC)    Refusal of blood transfusions as patient is Jehovah's Witness    Thoracic ascending aortic aneurysm     Past Surgical History:  Procedure Laterality Date   ABDOMINAL HYSTERECTOMY     CARPAL TUNNEL RELEASE     CHOLECYSTECTOMY     IRRIGATION AND DEBRIDEMENT SEBACEOUS CYST  1980   sebaceous cysts removed from tail bone   KNEE ARTHROSCOPY Bilateral    TUBAL LIGATION      Family History  Problem Relation Age of Onset   Asthma Mother    Colon cancer Mother    Cancer Mother    Depression Daughter    Diabetes Other        sibling  Hypertension Other        sibling   Diabetes Sister    Diabetes Brother    Kidney cancer  Son    Cancer Son    Cancer Daughter    Breast cancer Daughter 45   Depression Daughter    Clotting disorder Daughter    Depression Daughter    Breast cancer Maternal Grandmother    Heart attack Neg Hx     Social History:  reports that she quit smoking about 52 years ago. Her smoking use included cigarettes. She has a 5.00 pack-year smoking history. She has never used smokeless tobacco. She reports that she does not drink alcohol and does not use drugs.  Allergies:  Allergies  Allergen Reactions   Hydrocodone Nausea And Vomiting   Morphine Itching   Penicillins Itching     Review of Systems  She is on chronic Eliquis treatment from cardiologist   Examination:   BP 140/80   Pulse 87   Ht 5\' 6"  (1.676 m)   Wt 183 lb 9.6 oz (83.3 kg)   SpO2 99%   BMI 29.63 kg/m    Right thyroid lobe is enlarged 2-1/2 times normal, relatively firm and smooth  Left lobe is not palpable  No tremor Biceps reflexes appear to show normal relaxation Treated No peripheral edema   Assessment/Plan:   Hyperthyroidism, partly from Graves' disease but also has a multinodular goiter  She has been with radioactive iodine She has been off methimazole for about 6 weeks  Subjectively doing well and she looks euthyroid  Discussed that based on her lab results today we will decide whether she needs to be on thyroid supplementation Her thyroid enlargement is still about the same This is likely to be from her multinodular goiter   Bianca [name] 12/15/2020, 11:04 AM    Note: This office note was prepared with Dragon voice recognition system technology. Any transcriptional errors that result from this process are unintentional.  Addendum: Thyroid levels normal with TSH 0.4, she can follow-up in 3 months  Dwain Huhn Dwyane Dee

## 2020-12-19 DIAGNOSIS — H40023 Open angle with borderline findings, high risk, bilateral: Secondary | ICD-10-CM | POA: Diagnosis not present

## 2021-01-03 ENCOUNTER — Other Ambulatory Visit: Payer: Self-pay | Admitting: Interventional Cardiology

## 2021-01-03 DIAGNOSIS — R0602 Shortness of breath: Secondary | ICD-10-CM

## 2021-01-03 DIAGNOSIS — I10 Essential (primary) hypertension: Secondary | ICD-10-CM

## 2021-01-03 DIAGNOSIS — I5032 Chronic diastolic (congestive) heart failure: Secondary | ICD-10-CM

## 2021-01-03 DIAGNOSIS — I4819 Other persistent atrial fibrillation: Secondary | ICD-10-CM

## 2021-01-04 DIAGNOSIS — Z1152 Encounter for screening for COVID-19: Secondary | ICD-10-CM | POA: Diagnosis not present

## 2021-02-15 ENCOUNTER — Encounter: Payer: Self-pay | Admitting: Endocrinology

## 2021-02-15 ENCOUNTER — Ambulatory Visit (INDEPENDENT_AMBULATORY_CARE_PROVIDER_SITE_OTHER): Payer: Medicare Other | Admitting: Endocrinology

## 2021-02-15 ENCOUNTER — Other Ambulatory Visit: Payer: Self-pay

## 2021-02-15 VITALS — BP 142/80 | HR 60 | Ht 66.0 in | Wt 181.6 lb

## 2021-02-15 DIAGNOSIS — E042 Nontoxic multinodular goiter: Secondary | ICD-10-CM

## 2021-02-15 LAB — TSH: TSH: 0.4 u[IU]/mL (ref 0.35–5.50)

## 2021-02-15 LAB — T4, FREE: Free T4: 1.06 ng/dL (ref 0.60–1.60)

## 2021-02-15 LAB — T3, FREE: T3, Free: 3.4 pg/mL (ref 2.3–4.2)

## 2021-02-15 NOTE — Progress Notes (Signed)
Patient ID: Bianca [name], female   DOB: [birth year], 88 y.o.   MRN: [id number]                                                                                                               Reason for Appointment: Follow-up of thyroid   Chief complaint: None: Follow-up   History of Present Illness:   Baseline history on 06/02/2020 consultation For the last year patient has had symptoms of decreased appetite, weakness and weight loss She usually does not have palpitations although this may occur occasionally At times may have shakiness especially of her left hand She does think that she has been feeling excessively warm and sweaty including at night She was concerned about her feeling weak and tired The patient lost about 35 lbs since about a year ago She may have had a routine TSH test when being followed for her atrial fibrillation in 10/2019 and this was suppressed, previously had not had a TSH done since 2016  Since she had overt hyperthyroidism she had been on methimazole 5 mg daily  Subsequently had felt less tired and is feeling a little stronger but not back to normal  She had  radioactive iodine treatment done with 29 mCi on 08/16/2020  Methimazole has been stopped as of 10/22  Subsequently her thyroid levels have been normal although free T4 and T3 were trending higher in December  Recently does not feel unusually tired or weak, does not think she has any change in appetite No heat or cold intolerance No palpitations Has lost some weight gradually  Labs pending  Wt Readings from Last 3 Encounters:  02/15/21 181 lb 9.6 oz (82.4 kg)  12/15/20 183 lb 9.6 oz (83.3 kg)  11/01/20 187 lb 6.4 oz (85 kg)     She may have also had a thyroid nodule palpated on exam and was 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 If you have any questions, ask your nurse or doctor.          amLODipine-benazepril 5-20 MG capsule Commonly known as: LOTREL TAKE 1 CAPSULE BY MOUTH  DAILY   calcium-vitamin D 500-200 MG-UNIT tablet Commonly known as: OSCAL WITH D Take 1 tablet by mouth daily.  clotrimazole 1 % cream Commonly known as: LOTRIMIN APPLY TO AFFECTED AREA(S)  TOPICALLY TWO TIMES DAILY   diclofenac sodium 1 % Gel Commonly known as: VOLTAREN Apply 4 g topically 4 (four) times daily.   Eliquis 5 MG Tabs tablet Generic drug: apixaban TAKE 1 TABLET BY MOUTH  TWICE DAILY   fish oil-omega-3 fatty acids 1000 MG capsule Take 2 g by mouth daily.   furosemide 40 MG tablet Commonly known as: LASIX TAKE 1 AND 1/2 TABLETS BY  MOUTH DAILY   hydrocortisone cream 1 % Apply 1 application topically 2 (two) times daily. Only on face   Mapap Arthritis Pain 650 MG CR tablet Generic drug: acetaminophen TAKE 1 TABLET BY MOUTH  EVERY 8 HOURS AS NEEDED FOR PAIN   methimazole 5 MG tablet Commonly known as: TAPAZOLE TAKE 1 TABLET(5 MG) BY MOUTH TWICE DAILY   multivitamin tablet Take 1 tablet by mouth daily.   nitroGLYCERIN 0.4 MG SL tablet Commonly known as: NITROSTAT DISSOLVE 1 TABLET UNDER THE TONGUE EVERY 5 MINUTES AS  NEEDED FOR CHEST PAIN. MAX  OF 3 TABLETS IN 15 MINUTES. CALL 911 IF PAIN PERSISTS.   nystatin-triamcinolone cream Commonly known as: MYCOLOG II Apply to under breasts and on abdomen twice daily   pantoprazole 40 MG tablet Commonly  known as: PROTONIX Take 1 tablet (40 mg total) by mouth daily.   ramelteon 8 MG tablet Commonly known as: ROZEREM Take 1 tablet (8 mg total) by mouth at bedtime.   spironolactone 25 MG tablet Commonly known as: ALDACTONE TAKE ONE-HALF TABLET BY MOUTH  DAILY   terbinafine 1 % cream Commonly known as: LamISIL AT Apply 1 application topically 2 (two) times daily.   Vitamin D-3 25 MCG (1000 UT) Caps Take 1,000 Units by mouth daily.            Past Medical History:  Diagnosis Date   Bradycardia    a. Holter monitor in the past showed bradycardia/pauses, so she is not on nodal blockers.    Chronic diastolic heart failure ([place])    a. 12/2015 showed normal EF, mild LVH, 70mmm ascending aortc aneurysm, severe LAE, mildly dilated RV with mildly reduced RVF, mild TR/PR, mildly increased PASP.   GERD (gastroesophageal reflux disease)    Hx of cardiovascular stress test    Lexiscan Myoview (10/14):  Fixed inf defect, no ischemia, study not gated   Hx of echocardiogram    Echo (11/14):  Mild LVH, EF 60-65%, mod to severe LAE, mod RVE, mild to mod reduced RVSF, mild RAE, PASP 38   Hypertension    Long term current use of anticoagulant    Mild pulmonary hypertension (H[facility])    Obesity    Osteoarthritis    Permanent atrial fibrillation ([facility])    Refusal of blood transfusions as patient is Jehovah's Witness    Thoracic ascending aortic aneurysm     Past Surgical History:  Procedure Laterality Date   ABDOMINAL HYSTERECTOMY     CARPAL TUNNEL RELEASE     CHOLECYSTECTOMY     IRRIGATION AND DEBRIDEMENT SEBACEOUS CYST  1980   sebaceous cysts removed from tail bone   KNEE ARTHROSCOPY Bilateral    TUBAL LIGATION      Family History  Problem Relation Age of Onset   Asthma Mother    Colon cancer Mother    Cancer Mother    Depression Daughter    Diabetes Other        sibling   Hypertension Other  sibling   Diabetes Sister    Diabetes Brother    Kidney cancer Son    Cancer Son     Cancer Daughter    Breast cancer Daughter 100   Depression Daughter    Clotting disorder Daughter    Depression Daughter    Breast cancer Maternal Grandmother    Heart attack Neg Hx     Social History:  reports that she quit smoking about 52 years ago. Her smoking use included cigarettes. She has a 5.00 pack-year smoking history. She has never used smokeless tobacco. She reports that she does not drink alcohol and does not use drugs.  Allergies:  Allergies  Allergen Reactions   Hydrocodone Nausea And Vomiting   Morphine Itching   Penicillins Itching     Review of Systems  She is on chronic Eliquis treatment from cardiologist   Examination:   BP (!) 142/80    Pulse 60    Ht 5\' 6"  (1.676 m)    Wt 181 lb 9.6 oz (82.4 kg)    BMI 29.31 kg/m    Right thyroid lobe is enlarged about 2-1/2 times normal, relatively firm and smooth, better felt on swallowing  Left lobe is not palpable  No tremor    Assessment/Plan:  History of Hyperthyroidism, partly from Graves' disease but also has a multinodular goiter  She has been with radioactive iodine in 08/2020  She has been off methimazole and has remained euthyroid subsequently  Subjectively doing well although her weight is slightly lower Her exam is unremarkable except for persistent right-sided goiter as before  However since her thyroid levels were trending higher on the last visit we will recheck labs today Discussed that she may potentially have recurrence of her hyperthyroidism but also small possibility of getting hypothyroid in the future  Thyroid functions will be checked today and further management to be decided  Elayne Snare 02/15/2021, 10:25 AM    Note: This office note was prepared with Dragon voice recognition system technology. Any transcriptional errors that result from this process are unintentional.

## 2021-03-06 ENCOUNTER — Ambulatory Visit (INDEPENDENT_AMBULATORY_CARE_PROVIDER_SITE_OTHER): Payer: Medicare Other

## 2021-03-06 ENCOUNTER — Other Ambulatory Visit: Payer: Self-pay

## 2021-03-06 VITALS — Ht 66.0 in | Wt 181.0 lb

## 2021-03-06 DIAGNOSIS — Z0000 Encounter for general adult medical examination without abnormal findings: Secondary | ICD-10-CM

## 2021-03-06 NOTE — Progress Notes (Signed)
Subjective:   Bianca [name] is a 88 y.o. female who presents for Medicare Annual (Subsequent) preventive examination.  Patient consented to have virtual visit and was identified by name and date of birth. Method of visit: Telephone  Encounter participants: Patient: Bianca [name] - located at Home Nurse/Provider: Dorna Bloom - located at [facility] Others (if applicable): NA  Review of Systems: Defer to PCP  Cardiac Risk Factors include: hypertension;advanced age (>55men, >3 women)  Objective:   Vitals: Ht 5\' 6"  (1.676 m)    Wt 181 lb (82.1 kg)    BMI 29.21 kg/m   Body mass index is 29.21 kg/m.  Advanced Directives 03/06/2021 11/01/2020 05/15/2020 01/03/2020 01/03/2020 11/03/2019 01/05/2019  Does Patient Have a Medical Advance Directive? Yes No No No No No Yes  Type of Advance Directive [place]  Does patient want to make changes to medical advance directive? No - Patient declined - - - - - -  Copy of [place] in Chart? Yes - validated most recent copy scanned in chart (See row information) - - - - - Yes - validated most recent copy scanned in chart (See row information)  Would patient like information on creating a medical advance directive? - No - Patient declined No - Patient declined No - Patient declined No - Patient declined No - Patient declined -   Tobacco Social History   Tobacco Use  Smoking Status Former   Packs/day: 0.50   Years: 10.00   Pack years: 5.00   Types: Cigarettes   Quit date: 08/13/1968   Years since quitting: 52.6   Passive exposure: Past  Smokeless Tobacco Never     Clinical Intake:  Pre-visit preparation completed: Yes  Pain : No/denies pain Pain Score: 0-No pain  Nutritional Status: BMI 25 -29 Overweight  How often do you need to have someone help you when you read instructions, pamphlets, or other written materials from your doctor or pharmacy?: 3 -  Sometimes What is the last grade level you completed in school?: ~ 9th grade  Interpreter Needed?: No  Past Medical History:  Diagnosis Date   Bradycardia    a. Holter monitor in the past showed bradycardia/pauses, so she is not on nodal blockers.    Chronic diastolic heart failure ([place])    a. 12/2015 showed normal EF, mild LVH, 57mmm ascending aortc aneurysm, severe LAE, mildly dilated RV with mildly reduced RVF, mild TR/PR, mildly increased PASP.   GERD (gastroesophageal reflux disease)    Hx of cardiovascular stress test    Lexiscan Myoview (10/14):  Fixed inf defect, no ischemia, study not gated   Hx of echocardiogram    Echo (11/14):  Mild LVH, EF 60-65%, mod to severe LAE, mod RVE, mild to mod reduced RVSF, mild RAE, PASP 38   Hypertension    Long term current use of anticoagulant    Mild pulmonary hypertension (HCC)    Obesity    Osteoarthritis    Permanent atrial fibrillation (HCC)    Refusal of blood transfusions as patient is Jehovah's Witness    Thoracic ascending aortic aneurysm    Past Surgical History:  Procedure Laterality Date   ABDOMINAL HYSTERECTOMY     CARPAL TUNNEL RELEASE     CHOLECYSTECTOMY     IRRIGATION AND DEBRIDEMENT SEBACEOUS CYST  1980   sebaceous cysts removed from tail bone   KNEE ARTHROSCOPY Bilateral    TUBAL  LIGATION     Family History  Problem Relation Age of Onset   Asthma Mother    Colon cancer Mother    Cancer Mother    Diabetes Sister    Diabetes Brother    Depression Daughter    Cancer Daughter    Breast cancer Daughter 75   Depression Daughter    Clotting disorder Daughter    Depression Daughter    Kidney cancer Son    Cancer Son    Breast cancer Maternal Grandmother    Diabetes Other        sibling   Hypertension Other        sibling   Heart attack Neg Hx    Social History   Socioeconomic History   Marital status: Widowed    Spouse name: Not on file   Number of children: 11   Years of education: 9   Highest  education level: 9th grade  Occupational History   Occupation: Retired    Fish farm manager: Psychologist, sport and exercise [employer]    Comment: Cafeteria  Tobacco Use   Smoking status: Former    Packs/day: 0.50    Years: 10.00    Pack years: 5.00    Types: Cigarettes    Quit date: 08/13/1968    Years since quitting: 52.6    Passive exposure: Past   Smokeless tobacco: Never  Vaping Use   Vaping Use: Never used  Substance and Sexual Activity   Alcohol use: No   Drug use: No   Sexual activity: Not Currently  Other Topics Concern   Not on file  Social History Narrative   Jearldine lives alone,    Her husband passed away in 1987/04/24. She has 11 children. One of her sons passed away as an infant, and one of her daughters was killed by a boyfriend at 31 years of age. She reports that her remaining children have a lot of health problems and this makes her very sad.       She is a Sales promotion account executive witness.       Current Social History       Who lives at home: Patient lives alone in one level home    Transportation: Patient is driven by her niece and  granddaughter. Currently applying for medicare transportation.    Pets: None    Education / Work:  62 th grade/ Retired    Financial trader / Fun: Family events, picnics, out to eat   Religious / Personal Beliefs: Jehovah's Witness                                                                                                       Social Determinants of Radio broadcast assistant Strain: Low Risk    Difficulty of Paying Living Expenses: Not hard at all  Food Insecurity: No Food Insecurity   Worried About Charity fundraiser in the Last Year: Never true   Arboriculturist in the Last Year: Never true  Transportation Needs: No Transportation Needs   Lack of Transportation (Medical): No   Lack  of Transportation (Non-Medical): No  Physical Activity: Inactive   Days of Exercise per Week: 0 days   Minutes of Exercise per Session: 0 min  Stress: No Stress Concern Present    Feeling of Stress : Only a little  Social Connections: Moderately Isolated   Frequency of Communication with Friends and Family: More than three times a week   Frequency of Social Gatherings with Friends and Family: More than three times a week   Attends Religious Services: More than 4 times per year   Active Member of [employer] or Organizations: No   Attends Archivist Meetings: Never   Marital Status: Widowed   Outpatient Encounter Medications as of 03/06/2021  Medication Sig   amLODipine-benazepril (LOTREL) 5-20 MG capsule TAKE 1 CAPSULE BY MOUTH  DAILY   calcium-vitamin D (OSCAL WITH D 500-200) 500-200 MG-UNIT per tablet Take 1 tablet by mouth daily.   Cholecalciferol (VITAMIN D-3) 1000 UNITS CAPS Take 1,000 Units by mouth daily.    diclofenac sodium (VOLTAREN) 1 % GEL Apply 4 g topically 4 (four) times daily.   ELIQUIS 5 MG TABS tablet TAKE 1 TABLET BY MOUTH  TWICE DAILY   fish oil-omega-3 fatty acids 1000 MG capsule Take 2 g by mouth daily.   furosemide (LASIX) 40 MG tablet TAKE 1 AND 1/2 TABLETS BY  MOUTH DAILY   hydrocortisone cream 1 % Apply 1 application topically 2 (two) times daily. Only on face   Multiple Vitamin (MULTIVITAMIN) tablet Take 1 tablet by mouth daily.     nitroGLYCERIN (NITROSTAT) 0.4 MG SL tablet DISSOLVE 1 TABLET UNDER THE TONGUE EVERY 5 MINUTES AS  NEEDED FOR CHEST PAIN. MAX  OF 3 TABLETS IN 15 MINUTES. CALL 911 IF PAIN PERSISTS.   spironolactone (ALDACTONE) 25 MG tablet TAKE ONE-HALF TABLET BY MOUTH  DAILY   clotrimazole (LOTRIMIN) 1 % cream APPLY TO AFFECTED AREA(S)  TOPICALLY TWO TIMES DAILY (Patient not taking: Reported on 03/06/2021)   MAPAP ARTHRITIS PAIN 650 MG CR tablet TAKE 1 TABLET BY MOUTH  EVERY 8 HOURS AS NEEDED FOR PAIN (Patient not taking: Reported on 12/15/2020)   methimazole (TAPAZOLE) 5 MG tablet TAKE 1 TABLET(5 MG) BY MOUTH TWICE DAILY (Patient not taking: Reported on 12/15/2020)   nystatin-triamcinolone (MYCOLOG II) cream Apply to under  breasts and on abdomen twice daily (Patient not taking: Reported on 03/06/2021)   pantoprazole (PROTONIX) 40 MG tablet Take 1 tablet (40 mg total) by mouth daily. (Patient not taking: Reported on 03/06/2021)   ramelteon (ROZEREM) 8 MG tablet Take 1 tablet (8 mg total) by mouth at bedtime.   terbinafine (LAMISIL AT) 1 % cream Apply 1 application topically 2 (two) times daily. (Patient not taking: Reported on 03/06/2021)   No facility-administered encounter medications on file as of 03/06/2021.   Activities of Daily Living In your present state of health, do you have any difficulty performing the following activities: 03/06/2021  Hearing? N  Vision? N  Difficulty concentrating or making decisions? Y  Walking or climbing stairs? Y  Dressing or bathing? N  Doing errands, shopping? Y  Preparing Food and eating ? N  Using the Toilet? N  In the past six months, have you accidently leaked urine? Y  Do you have problems with loss of bowel control? N  Managing your Medications? N  Managing your Finances? N  Housekeeping or managing your Housekeeping? N  Some recent data might be hidden   Patient Care Team: Lattie Haw, MD as PCP - [place],  Charlann Lange, MD as PCP - Cardiology (Cardiology) Monna Fam, MD (Ophthalmology) Trenton Gammon, DDS (General Practice) Elayne Snare, MD as Consulting Physician (Endocrinology)    Assessment:   This is a routine wellness examination for [facility].  Exercise Activities and Dietary recommendations Current Exercise Habits: The patient does not participate in regular exercise at present, Exercise limited by: orthopedic condition(s);cardiac condition(s)   Goals      Patient Stated     Maintain current level of health for my age.      Reduce calorie intake to 2000 calories per day     Would like to restart weight watchers.      Start water aerobis class     Plans to use her silver sneakers benefit to start water aerobics. Patient will call [employer]  and [employer] to ask about benefits.       Fall Risk Fall Risk  03/06/2021 11/01/2020 05/15/2020 01/03/2020 12/14/2019  Falls in the past year? 1 0 0 1 1  Number falls in past yr: 1 0 0 - 0  Injury with Fall? 0 0 0 0 0  Risk Factor Category  - - - - -  Risk for fall due to : Orthopedic patient;Impaired balance/gait - - - -  Follow up - - - - -   Patient reports abnormal gait and balance due to orthopedic issues. Patient uses a cane or a rolator to ambulate.   Is the patient's home free of loose throw rugs in walkways, pet beds, electrical cords, etc?   yes      Grab bars in the bathroom? yes      Handrails on the stairs?   yes      Adequate lighting?   yes  Patient rating of health (0-10) scale: 8   Depression Screen PHQ 2/9 Scores 03/06/2021 11/01/2020 05/15/2020 01/03/2020  PHQ - 2 Score 0 0 0 0  PHQ- 9 Score - 9 6 6     Cognitive Function 6CIT Screen 03/06/2021  What Year? 0 points  What month? 0 points  What time? 0 points  Count back from 20 2 points  Months in reverse 4 points  Repeat phrase 4 points  Total Score 10   Immunization History  Administered Date(s) Administered   Fluad Quad(high Dose 65+) 09/17/2018, 08/16/2019   Influenza Whole 10/08/2006, 10/28/2007, 10/24/2008   Influenza, High Dose Seasonal PF 10/15/2013, 09/26/2014, 10/14/2016   Influenza,inj,Quad PF,6+ Mos 10/06/2012, 09/11/2015   Influenza-Unspecified 09/14/2017, 10/06/2017   PFIZER(Purple Top)SARS-COV-2 Vaccination 02/10/2019, 03/03/2019, 09/24/2019, 05/04/2020   Pfizer Covid-19 Vaccine Bivalent Booster 8yrs & up 11/01/2020   Pneumococcal Conjugate-13 05/03/2015   Pneumococcal Polysaccharide-23 11/14/1997   Td 01/14/1994, 01/14/2005, 10/22/2007   Tdap 03/26/2018   Zoster Recombinat (Shingrix) 10/28/2016, 01/08/2017   Zoster, Live 10/06/2012   Screening Tests Health Maintenance  Topic Date Due   INFLUENZA VACCINE  04/13/2021 (Originally 08/14/2020)   TETANUS/TDAP  03/25/2028   Pneumonia Vaccine 37+  Years old  Completed   DEXA SCAN  Completed   COVID-19 Vaccine  Completed   Zoster Vaccines- Shingrix  Completed   HPV VACCINES  Aged Out   Cancer Screenings: Lung: Low Dose CT Chest recommended if Age 70-80 years, 20 pack-year currently smoking OR have quit w/in 15years. Patient does not qualify. Breast:  Up to date on Mammogram? Yes  September 2022 Up to date of Bone Density/Dexa? Yes Colorectal: 2012  Plan:  PCP apt due in April. Please call in March to schedule this.  Let us know  if you need assistance applying for Medicare transportation.   I have personally reviewed and noted the following in the patients chart:   Medical and social history Use of alcohol, tobacco or illicit drugs  Current medications and supplements Functional ability and status Nutritional status Physical activity Advanced directives List of other physicians Hospitalizations, surgeries, and ER visits in previous 12 months Vitals Screenings to include cognitive, depression, and falls Referrals and appointments  In addition, I have reviewed and discussed with patient certain preventive protocols, quality metrics, and best practice recommendations. A written personalized care plan for preventive services as well as general preventive health recommendations were provided to patient.  This visit was conducted virtually in the setting of the [place] pandemic.    Dorna Bloom, [place]  03/13/2021

## 2021-03-13 NOTE — Patient Instructions (Signed)
You spoke to Bianca [name], [place] over the phone for your annual wellness visit.  We discussed goals:   Goals      Patient Stated     Maintain current level of health.     Reduce calorie intake to 2000 calories per day     Would like to restart weight watchers.      Start water aerobis class     Plans to use her silver sneakers benefit to start water aerobics. Patient will call [employer] and [employer] to ask about benefits.       We also discussed recommended health maintenance. Please call our office and schedule a visit. As discussed, you are due for: Health Maintenance  Topic Date Due   INFLUENZA VACCINE  04/13/2021 (Originally 08/14/2020)   TETANUS/TDAP  03/25/2028   Pneumonia Vaccine 22+ Years old  Completed   DEXA SCAN  Completed   COVID-19 Vaccine  Completed   Zoster Vaccines- Shingrix  Completed   HPV VACCINES  Aged Out   PCP apt due in April. Please call in March to schedule this.  Let us know if you need assistance applying for Medicare transportation.   Preventive Care 50 Years and Older, Female Preventive care refers to lifestyle choices and visits with your health care provider that can promote health and wellness. Preventive care visits are also called wellness exams. What can I expect for my preventive care visit? Counseling Your health care provider may ask you questions about your: Medical history, including: Past medical problems. Family medical history. Pregnancy and menstrual history. History of falls. Current health, including: Memory and ability to understand (cognition). Emotional well-being. Home life and relationship well-being. Sexual activity and sexual health. Lifestyle, including: Alcohol, nicotine or tobacco, and drug use. Access to firearms. Diet, exercise, and sleep habits. Work and work Statistician. Sunscreen use. Safety issues such as seatbelt and bike helmet use. Physical exam Your health care provider will check your: Height and weight.  These may be used to calculate your BMI (body mass index). BMI is a measurement that tells if you are at a healthy weight. Waist circumference. This measures the distance around your waistline. This measurement also tells if you are at a healthy weight and may help predict your risk of certain diseases, such as type 2 diabetes and high blood pressure. Heart rate and blood pressure. Body temperature. Skin for abnormal spots. What immunizations do I need? Vaccines are usually given at various ages, according to a schedule. Your health care provider will recommend vaccines for you based on your age, medical history, and lifestyle or other factors, such as travel or where you work. What tests do I need? Screening Your health care provider may recommend screening tests for certain conditions. This may include: Lipid and cholesterol levels. Hepatitis C test. Hepatitis B test. HIV (human immunodeficiency virus) test. STI (sexually transmitted infection) testing, if you are at risk. Lung cancer screening. Colorectal cancer screening. Diabetes screening. This is done by checking your blood sugar (glucose) after you have not eaten for a while (fasting). Mammogram. Talk with your health care provider about how often you should have regular mammograms. BRCA-related cancer screening. This may be done if you have a family history of breast, ovarian, tubal, or peritoneal cancers. Bone density scan. This is done to screen for osteoporosis. Talk with your health care provider about your test results, treatment options, and if necessary, the need for more tests. Follow these instructions at home: Eating and drinking  Eat  a diet that includes fresh fruits and vegetables, whole grains, lean protein, and low-fat dairy products. Limit your intake of foods with high amounts of sugar, saturated fats, and salt. Take vitamin and mineral supplements as recommended by your health care provider. Do not drink alcohol  if your health care provider tells you not to drink. If you drink alcohol: Limit how much you have to 0-1 drink a day. Know how much alcohol is in your drink. In the [place] one drink equals one 12 oz bottle of beer (355 mL), one 5 oz glass of wine (148 mL), or one 1 oz glass of hard liquor (44 mL). Lifestyle Brush your teeth every morning and night with fluoride toothpaste. Floss one time each day. Exercise for at least 30 minutes 5 or more days each week. Do not use any products that contain nicotine or tobacco. These products include cigarettes, chewing tobacco, and vaping devices, such as e-cigarettes. If you need help quitting, ask your health care provider. Do not use drugs. If you are sexually active, practice safe sex. Use a condom or other form of protection in order to prevent STIs. Take aspirin only as told by your health care provider. Make sure that you understand how much to take and what form to take. Work with your health care provider to find out whether it is safe and beneficial for you to take aspirin daily. Ask your health care provider if you need to take a cholesterol-lowering medicine (statin). Find healthy ways to manage stress, such as: Meditation, yoga, or listening to music. Journaling. Talking to a trusted person. Spending time with friends and family. Minimize exposure to UV radiation to reduce your risk of skin cancer. Safety Always wear your seat belt while driving or riding in a vehicle. Do not drive: If you have been drinking alcohol. Do not ride with someone who has been drinking. When you are tired or distracted. While texting. If you have been using any mind-altering substances or drugs. Wear a helmet and other protective equipment during sports activities. If you have firearms in your house, make sure you follow all gun safety procedures. What's next? Visit your health care provider once a year for an annual wellness visit. Ask your health care  provider how often you should have your eyes and teeth checked. Stay up to date on all vaccines. This information is not intended to replace advice given to you by your health care provider. Make sure you discuss any questions you have with your health care provider. Document Revised: 06/28/2020 Document Reviewed: 06/28/2020 Elsevier Patient Education  2022 [place] Prevention in the Home, Adult Falls can cause injuries and can happen to people of all ages. There are many things you can do to make your home safe and to help prevent falls. Ask for help when making these changes. What actions can I take to prevent falls? General Instructions Use good lighting in all rooms. Replace any light bulbs that burn out. Turn on the lights in dark areas. Use night-lights. Keep items that you use often in easy-to-reach places. Lower the shelves around your home if needed. Set up your furniture so you have a clear path. Avoid moving your furniture around. Do not have throw rugs or other things on the floor that can make you trip. Avoid walking on wet floors. If any of your floors are uneven, fix them. Add color or contrast paint or tape to clearly mark and help you see: Grab bars  or handrails. First and last steps of staircases. Where the edge of each step is. If you use a stepladder: Make sure that it is fully opened. Do not climb a closed stepladder. Make sure the sides of the stepladder are locked in place. Ask someone to hold the stepladder while you use it. Know where your pets are when moving through your home. What can I do in the bathroom?   Keep the floor dry. Clean up any water on the floor right away. Remove soap buildup in the tub or shower. Use nonskid mats or decals on the floor of the tub or shower. Attach bath mats securely with double-sided, nonslip rug tape. If you need to sit down in the shower, use a plastic, nonslip stool. Install grab bars by the toilet and in the  tub and shower. Do not use towel bars as grab bars. What can I do in the bedroom? Make sure that you have a light by your bed that is easy to reach. Do not use any sheets or blankets for your bed that hang to the floor. Have a firm chair with side arms that you can use for support when you get dressed. What can I do in the kitchen? Clean up any spills right away. If you need to reach something above you, use a step stool with a grab bar. Keep electrical cords out of the way. Do not use floor polish or wax that makes floors slippery. What can I do with my stairs? Do not leave any items on the stairs. Make sure that you have a light switch at the top and the bottom of the stairs. Make sure that there are handrails on both sides of the stairs. Fix handrails that are broken or loose. Install nonslip stair treads on all your stairs. Avoid having throw rugs at the top or bottom of the stairs. Choose a carpet that does not hide the edge of the steps on the stairs. Check carpeting to make sure that it is firmly attached to the stairs. Fix carpet that is loose or worn. What can I do on the outside of my home? Use bright outdoor lighting. Fix the edges of walkways and driveways and fix any cracks. Remove anything that might make you trip as you walk through a door, such as a raised step or threshold. Trim any bushes or trees on paths to your home. Check to see if handrails are loose or broken and that both sides of all steps have handrails. Install guardrails along the edges of any raised decks and porches. Clear paths of anything that can make you trip, such as tools or rocks. Have leaves, snow, or ice cleared regularly. Use sand or salt on paths during winter. Clean up any spills in your garage right away. This includes grease or oil spills. What other actions can I take? Wear shoes that: Have a low heel. Do not wear high heels. Have rubber bottoms. Feel good on your feet and fit well. Are  closed at the toe. Do not wear open-toe sandals. Use tools that help you move around if needed. These include: Canes. Walkers. Scooters. Crutches. Review your medicines with your doctor. Some medicines can make you feel dizzy. This can increase your chance of falling. Ask your doctor what else you can do to help prevent falls. Where to find more information Centers for Disease Control and Prevention, [place]: http://www.wolf.info/ National Institute on Aging: http://kim-miller.com/ Contact a doctor if: You are afraid of falling  at home. You feel weak, drowsy, or dizzy at home. You fall at home. Summary There are many simple things that you can do to make your home safe and to help prevent falls. Ways to make your home safe include removing things that can make you trip and installing grab bars in the bathroom. Ask for help when making these changes in your home. This information is not intended to replace advice given to you by your health care provider. Make sure you discuss any questions you have with your health care provider. Document Revised: 08/04/2019 Document Reviewed: 08/04/2019 Elsevier Patient Education  2022 [employer].   Our clinic's number is [phone]. Please call with questions or concerns about what we discussed today.

## 2021-03-13 NOTE — Progress Notes (Signed)
I have reviewed this visit and agree with the documentation.   

## 2021-04-08 NOTE — Progress Notes (Signed)
?  ?Cardiology Office Note ? ? ?Date:  04/09/2021  ? ?ID:  [name], DOB 1930/04/14, MRN [id number] ? ?PCP:  Lattie Haw, MD  ? ? ?Chief Complaint  ?Patient presents with  ? Follow-up  ? ?Chronic diastolic heart failure ? ?Wt Readings from Last 3 Encounters:  ?04/09/21 183 lb (83 kg)  ?03/06/21 181 lb (82.1 kg)  ?02/15/21 181 lb 9.6 oz (82.4 kg)  ?  ? ?  ?History of Present Illness: ?Deneen Slager is a 88 y.o. female  Who has Permanent atrial fibrillation and chronic diastolic heart failure.  Echo in 12/17 showed: ?  ?Left ventricle: The cavity size was normal. There was mild ?  concentric hypertrophy. Systolic function was normal. Wall motion ?  was normal; there were no regional wall motion abnormalities. ?- Aortic valve: There was no regurgitation. ?- Ascending aorta: The ascending aorta was moderately dilated ?  measuring 47 mm. ?- Mitral valve: Mildly thickened leaflets . There was mild ?  regurgitation. ?- Left atrium: The atrium was severely dilated. ?- Right ventricle: The cavity size was mildly dilated. Wall ?  thickness was normal. Systolic function was mildly reduced. ?- Right atrium: The atrium was moderately dilated. ?- Tricuspid valve: There was mild regurgitation. ?- Pulmonic valve: There was mild regurgitation. ?- Pulmonary arteries: Systolic pressure was mildly increased. PA ?  peak pressure: 38 mm Hg (S). ?- Inferior vena cava: The vessel was normal in size. ?  ?MRA in 1/18 showed: Aneurysmal dilatation of the ascending thoracic aorta measuring up ?to 4.7 cm in maximal diameter. The aortic root/sinuses of Valsalva  ?do not show aneurysmal dilatation. ?  ?She has had leg swelling in the past.  She had been taking daily Lasix, although it was prescribed twice a day. ?  ?In 12/18, she had some chest discomfort, started epigastric and went up into her chest.  She had drank some soda and that helped.  She ate some sausages that she would not normally eat , that evening.  No further  since that time.  ?  ?In May 2020, BP was checked at home.  Meds were not changed: "Given her advanced age and h/o bradycardia, will hold course for now then especially since she periodically swings down to 114-121." ? ?No chest pain; chronic SHOB.  Uses a cane.  No recent falls.  No recent bleeding issues.  ? ? ? ? ? ?Past Medical History:  ?Diagnosis Date  ? Bradycardia   ? a. Holter monitor in the past showed bradycardia/pauses, so she is not on nodal blockers.   ? Chronic diastolic heart failure ([place])   ? a. 12/2015 showed normal EF, mild LVH, 42mm ascending aortc aneurysm, severe LAE, mildly dilated RV with mildly reduced RVF, mild TR/PR, mildly increased PASP.  ? GERD (gastroesophageal reflux disease)   ? Hx of cardiovascular stress test   ? Lexiscan Myoview (10/14):  Fixed inf defect, no ischemia, study not gated  ? Hx of echocardiogram   ? Echo (11/14):  Mild LVH, EF 60-65%, mod to severe LAE, mod RVE, mild to mod reduced RVSF, mild RAE, PASP 38  ? Hypertension   ? Long term current use of anticoagulant   ? Mild pulmonary hypertension ([facility]   ? Obesity   ? Osteoarthritis   ? Permanent atrial fibrillation ([place]   ? Refusal of blood transfusions as patient is Jehovah's Witness   ? Thoracic ascending aortic aneurysm   ? ? ?Past Surgical History:  ?Procedure Laterality  Date  ? ABDOMINAL HYSTERECTOMY    ? CARPAL TUNNEL RELEASE    ? CHOLECYSTECTOMY    ? IRRIGATION AND DEBRIDEMENT SEBACEOUS CYST  1980  ? sebaceous cysts removed from tail bone  ? KNEE ARTHROSCOPY Bilateral   ? TUBAL LIGATION    ? ? ? ?Current Outpatient Medications  ?Medication Sig Dispense Refill  ? amLODipine-benazepril (LOTREL) 5-20 MG capsule TAKE 1 CAPSULE BY MOUTH  DAILY 90 capsule 3  ? calcium-vitamin D (OSCAL WITH D 500-200) 500-200 MG-UNIT per tablet Take 1 tablet by mouth daily.    ? Cholecalciferol (VITAMIN D-3) 1000 UNITS CAPS Take 1,000 Units by mouth daily.     ? clotrimazole (LOTRIMIN) 1 % cream APPLY TO AFFECTED AREA(S)  TOPICALLY  TWO TIMES DAILY 30 g 0  ? diclofenac sodium (VOLTAREN) 1 % GEL Apply 4 g topically 4 (four) times daily. 150 g 3  ? ELIQUIS 5 MG TABS tablet TAKE 1 TABLET BY MOUTH  TWICE DAILY 180 tablet 3  ? fish oil-omega-3 fatty acids 1000 MG capsule Take 2 g by mouth daily.    ? furosemide (LASIX) 40 MG tablet TAKE 1 AND 1/2 TABLETS BY  MOUTH DAILY 135 tablet 3  ? hydrocortisone cream 1 % Apply 1 application topically 2 (two) times daily. Only on face 30 g 0  ? MAPAP ARTHRITIS PAIN 650 MG CR tablet TAKE 1 TABLET BY MOUTH  EVERY 8 HOURS AS NEEDED FOR PAIN 120 tablet 0  ? methimazole (TAPAZOLE) 5 MG tablet TAKE 1 TABLET(5 MG) BY MOUTH TWICE DAILY 180 tablet 3  ? Multiple Vitamin (MULTIVITAMIN) tablet Take 1 tablet by mouth daily.      ? nitroGLYCERIN (NITROSTAT) 0.4 MG SL tablet DISSOLVE 1 TABLET UNDER THE TONGUE EVERY 5 MINUTES AS  NEEDED FOR CHEST PAIN. MAX  OF 3 TABLETS IN 15 MINUTES. CALL 911 IF PAIN PERSISTS. 100 tablet 0  ? nystatin-triamcinolone (MYCOLOG II) cream Apply to under breasts and on abdomen twice daily 30 g 0  ? spironolactone (ALDACTONE) 25 MG tablet TAKE ONE-HALF TABLET BY MOUTH  DAILY 15 tablet 4  ? ?No current facility-administered medications for this visit.  ? ? ?Allergies:   Hydrocodone, Morphine, and Penicillins  ? ? ?Social History:  The patient  reports that she quit smoking about 52 years ago. Her smoking use included cigarettes. She has a 5.00 pack-year smoking history. She has been exposed to tobacco smoke. She has never used smokeless tobacco. She reports that she does not drink alcohol and does not use drugs.  ? ?Family History:  The patient's family history includes Asthma in her mother; Breast cancer in her maternal grandmother; Breast cancer (age of onset: 95) in her daughter; Cancer in her daughter, mother, and son; Clotting disorder in her daughter; Colon cancer in her mother; Depression in her daughter, daughter, and daughter; Diabetes in her brother, sister, and another family member;  Hypertension in an other family member; Kidney cancer in her son.  ? ? ?ROS:  Please see the history of present illness.   Otherwise, review of systems are positive for .   All other systems are reviewed and negative.  ? ? ?PHYSICAL EXAM: ?VS:  BP 136/82   Pulse 86   Ht '5\' 6"'$  (1.676 m)   Wt 183 lb (83 kg)   SpO2 99%   BMI 29.54 kg/m?  , BMI Body mass index is 29.54 kg/m?. ?GEN: Well nourished, well developed, in no acute distress ?HEENT: normal ?Neck: no JVD, carotid  bruits, or masses ?Cardiac: irregularly irregular; no murmurs, rubs, or gallops,no edema  ?Respiratory:  clear to auscultation bilaterally, normal work of breathing ?GI: soft, nontender, nondistended, + BS ?MS: no deformity or atrophy ?Skin: warm and dry, no rash ?Neuro:  Strength and sensation are intact ?Psych: euthymic mood, full affect ? ? ?EKG:   ?The ekg ordered today demonstrates rate controlled atrial fibrillation ? ? ?Recent Labs: ?06/06/2020: ALT 12; Hemoglobin 11.7; Platelets 186.0 ?02/15/2021: TSH 0.40  ? ?Lipid Panel ?   ?Component Value Date/Time  ? CHOL 137 10/27/2019 1048  ? TRIG 66 10/27/2019 1048  ? HDL 45 10/27/2019 1048  ? CHOLHDL 3.0 10/27/2019 1048  ? CHOLHDL 3.6 12/21/2014 1523  ? VLDL 23 12/21/2014 1523  ? [place] 78 10/27/2019 1048  ? LDLDIRECT 119 (H) 06/14/2009 1819  ? ?  ?Other studies Reviewed: ?Additional studies/ records that were reviewed today with results demonstrating: labs reviewed; thyroid studies checked in February 2023. ? ? ?ASSESSMENT AND PLAN: ? ?Chronic diastolic heart failure: No swelling or extra fluid symptoms.  Appears euvolemic. Chronic shortness of breath.  Walking is limited by knee pain.  Using a cane regularly. ?AFib: Rate controlled.  No palpitations.  Acquired thrombophilia. ELiquis.  Check kidney function and hemoglobin every 6 months.  Check today. ?Thoracic aneurysm:  No chest pain.  46 mm in 2020- stable from 2017.  Not a candidate for surgery, would not continue imaging tests.  She is in  agreement. ?Anticoagulated:  No bleeding problems. No recent falls. . Continue using the cane.  avoiding falls is a top priority. ?Hypertensive heart disease: The current medical regimen is effective;  continue present p

## 2021-04-09 ENCOUNTER — Ambulatory Visit (INDEPENDENT_AMBULATORY_CARE_PROVIDER_SITE_OTHER): Payer: Medicare Other | Admitting: Interventional Cardiology

## 2021-04-09 ENCOUNTER — Encounter: Payer: Self-pay | Admitting: Interventional Cardiology

## 2021-04-09 ENCOUNTER — Encounter (INDEPENDENT_AMBULATORY_CARE_PROVIDER_SITE_OTHER): Payer: Self-pay

## 2021-04-09 ENCOUNTER — Other Ambulatory Visit: Payer: Self-pay

## 2021-04-09 VITALS — BP 136/82 | HR 86 | Ht 66.0 in | Wt 183.0 lb

## 2021-04-09 DIAGNOSIS — I7121 Aneurysm of the ascending aorta, without rupture: Secondary | ICD-10-CM | POA: Diagnosis not present

## 2021-04-09 DIAGNOSIS — Z7901 Long term (current) use of anticoagulants: Secondary | ICD-10-CM

## 2021-04-09 DIAGNOSIS — I5032 Chronic diastolic (congestive) heart failure: Secondary | ICD-10-CM | POA: Diagnosis not present

## 2021-04-09 DIAGNOSIS — I110 Hypertensive heart disease with heart failure: Secondary | ICD-10-CM | POA: Diagnosis not present

## 2021-04-09 DIAGNOSIS — I4819 Other persistent atrial fibrillation: Secondary | ICD-10-CM

## 2021-04-09 DIAGNOSIS — D6869 Other thrombophilia: Secondary | ICD-10-CM | POA: Diagnosis not present

## 2021-04-09 LAB — BASIC METABOLIC PANEL
BUN/Creatinine Ratio: 34 — ABNORMAL HIGH (ref 12–28)
BUN: 23 mg/dL (ref 8–27)
CO2: 27 mmol/L (ref 20–29)
Calcium: 10 mg/dL (ref 8.7–10.3)
Chloride: 102 mmol/L (ref 96–106)
Creatinine, Ser: 0.68 mg/dL (ref 0.57–1.00)
Glucose: 96 mg/dL (ref 70–99)
Potassium: 4.9 mmol/L (ref 3.5–5.2)
Sodium: 139 mmol/L (ref 134–144)
eGFR: 84 mL/min/{1.73_m2} (ref >59)

## 2021-04-09 LAB — CBC
Hematocrit: 38.5 % (ref 34.0–46.6)
Hemoglobin: 12.1 g/dL (ref 11.1–15.9)
MCH: 28.5 pg (ref 26.6–33.0)
MCHC: 31.4 g/dL — ABNORMAL LOW (ref 31.5–35.7)
MCV: 91 fL (ref 79–97)
Platelets: 176 10*3/uL (ref 150–450)
RBC: 4.24 x10E6/uL (ref 3.77–5.28)
RDW: 12.8 % (ref 11.7–15.4)
WBC: 4.9 10*3/uL (ref 3.4–10.8)

## 2021-04-09 NOTE — Patient Instructions (Signed)
Medication Instructions:  ?Your physician recommends that you continue on your current medications as directed. Please refer to the Current Medication list given to you today. ? ?*If you need a refill on your cardiac medications before your next appointment, please call your pharmacy* ? ? ?Lab Work: ?Lab work to be done today--CBC and BMP ?If you have labs (blood work) drawn today and your tests are completely normal, you will receive your results only by: ?MyChart Message (if you have MyChart) OR ?A paper copy in the mail ?If you have any lab test that is abnormal or we need to change your treatment, we will call you to review the results. ? ? ?Testing/Procedures: ?none ? ? ?Follow-Up: ?At [facility], you and your health needs are our priority.  As part of our continuing mission to provide you with exceptional heart care, we have created designated Provider Care Teams.  These Care Teams include your primary Cardiologist (physician) and Advanced Practice Providers (APPs -  Physician Assistants and Nurse Practitioners) who all work together to provide you with the care you need, when you need it. ? ?We recommend signing up for the patient portal called "MyChart".  Sign up information is provided on this After Visit Summary.  MyChart is used to connect with patients for Virtual Visits (Telemedicine).  Patients are able to view lab/test results, encounter notes, upcoming appointments, etc.  Non-urgent messages can be sent to your provider as well.   ?To learn more about what you can do with MyChart, go to [email or web address].   ? ?Your next appointment:   ?12 month(s) ? ?The format for your next appointment:   ?In Person ? ?Provider:   ?Larae Grooms, MD   ? ? ?Other Instructions ?  ? ?

## 2021-04-14 DIAGNOSIS — Z1152 Encounter for screening for COVID-19: Secondary | ICD-10-CM | POA: Diagnosis not present

## 2021-04-24 DIAGNOSIS — H40023 Open angle with borderline findings, high risk, bilateral: Secondary | ICD-10-CM | POA: Diagnosis not present

## 2021-04-29 DIAGNOSIS — Z1152 Encounter for screening for COVID-19: Secondary | ICD-10-CM | POA: Diagnosis not present

## 2021-05-04 DIAGNOSIS — Z1152 Encounter for screening for COVID-19: Secondary | ICD-10-CM | POA: Diagnosis not present

## 2021-05-11 DIAGNOSIS — Z1152 Encounter for screening for COVID-19: Secondary | ICD-10-CM | POA: Diagnosis not present

## 2021-05-16 ENCOUNTER — Other Ambulatory Visit: Payer: Self-pay | Admitting: Interventional Cardiology

## 2021-05-16 DIAGNOSIS — R0789 Other chest pain: Secondary | ICD-10-CM

## 2021-05-19 DIAGNOSIS — Z1152 Encounter for screening for COVID-19: Secondary | ICD-10-CM | POA: Diagnosis not present

## 2021-05-23 ENCOUNTER — Other Ambulatory Visit: Payer: Self-pay | Admitting: Interventional Cardiology

## 2021-05-23 DIAGNOSIS — I10 Essential (primary) hypertension: Secondary | ICD-10-CM

## 2021-05-23 DIAGNOSIS — R0602 Shortness of breath: Secondary | ICD-10-CM

## 2021-05-23 DIAGNOSIS — I4819 Other persistent atrial fibrillation: Secondary | ICD-10-CM

## 2021-05-23 DIAGNOSIS — I5032 Chronic diastolic (congestive) heart failure: Secondary | ICD-10-CM

## 2021-05-27 DIAGNOSIS — Z1152 Encounter for screening for COVID-19: Secondary | ICD-10-CM | POA: Diagnosis not present

## 2021-06-15 DIAGNOSIS — H40021 Open angle with borderline findings, high risk, right eye: Secondary | ICD-10-CM | POA: Diagnosis not present

## 2021-06-15 DIAGNOSIS — H26491 Other secondary cataract, right eye: Secondary | ICD-10-CM | POA: Diagnosis not present

## 2021-06-15 DIAGNOSIS — H401121 Primary open-angle glaucoma, left eye, mild stage: Secondary | ICD-10-CM | POA: Diagnosis not present

## 2021-06-15 DIAGNOSIS — H524 Presbyopia: Secondary | ICD-10-CM | POA: Diagnosis not present

## 2021-06-15 DIAGNOSIS — H353131 Nonexudative age-related macular degeneration, bilateral, early dry stage: Secondary | ICD-10-CM | POA: Diagnosis not present

## 2021-06-24 DIAGNOSIS — Z1152 Encounter for screening for COVID-19: Secondary | ICD-10-CM | POA: Diagnosis not present

## 2021-06-25 DIAGNOSIS — Z1152 Encounter for screening for COVID-19: Secondary | ICD-10-CM | POA: Diagnosis not present

## 2021-07-20 DIAGNOSIS — Z1152 Encounter for screening for COVID-19: Secondary | ICD-10-CM | POA: Diagnosis not present

## 2021-07-26 DIAGNOSIS — Z1152 Encounter for screening for COVID-19: Secondary | ICD-10-CM | POA: Diagnosis not present

## 2021-07-31 DIAGNOSIS — Z20822 Contact with and (suspected) exposure to covid-19: Secondary | ICD-10-CM | POA: Diagnosis not present

## 2021-08-16 DIAGNOSIS — Z1152 Encounter for screening for COVID-19: Secondary | ICD-10-CM | POA: Diagnosis not present

## 2021-08-18 DIAGNOSIS — Z1152 Encounter for screening for COVID-19: Secondary | ICD-10-CM | POA: Diagnosis not present

## 2021-08-21 DIAGNOSIS — M170 Bilateral primary osteoarthritis of knee: Secondary | ICD-10-CM | POA: Diagnosis not present

## 2021-09-03 ENCOUNTER — Other Ambulatory Visit: Payer: Self-pay | Admitting: Student

## 2021-09-03 DIAGNOSIS — Z1231 Encounter for screening mammogram for malignant neoplasm of breast: Secondary | ICD-10-CM

## 2021-09-11 DIAGNOSIS — Z1152 Encounter for screening for COVID-19: Secondary | ICD-10-CM | POA: Diagnosis not present

## 2021-09-17 DIAGNOSIS — Z1152 Encounter for screening for COVID-19: Secondary | ICD-10-CM | POA: Diagnosis not present

## 2021-09-22 ENCOUNTER — Other Ambulatory Visit: Payer: Self-pay | Admitting: Interventional Cardiology

## 2021-09-22 ENCOUNTER — Other Ambulatory Visit: Payer: Self-pay | Admitting: Family Medicine

## 2021-09-24 ENCOUNTER — Other Ambulatory Visit: Payer: Self-pay | Admitting: *Deleted

## 2021-09-24 MED ORDER — FUROSEMIDE 40 MG PO TABS: 60.0000 mg | ORAL_TABLET | Freq: Every day | ORAL | 3 refills | Status: DC

## 2021-09-24 NOTE — Telephone Encounter (Signed)
Prescription refill request for Eliquis received.  Indication: afib  Last office visit: [place], 04/09/2021 Scr: 0.68, 04/09/2021 Age: 88 yo  Weight: 83 kg   Refill sent.

## 2021-10-05 ENCOUNTER — Ambulatory Visit (INDEPENDENT_AMBULATORY_CARE_PROVIDER_SITE_OTHER): Payer: Medicare Other | Admitting: Student

## 2021-10-05 ENCOUNTER — Encounter: Payer: Self-pay | Admitting: Student

## 2021-10-05 VITALS — BP 124/78 | HR 63 | Ht 66.0 in | Wt 175.0 lb

## 2021-10-05 DIAGNOSIS — I5032 Chronic diastolic (congestive) heart failure: Secondary | ICD-10-CM

## 2021-10-05 DIAGNOSIS — R0602 Shortness of breath: Secondary | ICD-10-CM | POA: Diagnosis not present

## 2021-10-05 DIAGNOSIS — Z0000 Encounter for general adult medical examination without abnormal findings: Secondary | ICD-10-CM | POA: Diagnosis not present

## 2021-10-05 DIAGNOSIS — R634 Abnormal weight loss: Secondary | ICD-10-CM

## 2021-10-05 DIAGNOSIS — Z1152 Encounter for screening for COVID-19: Secondary | ICD-10-CM | POA: Diagnosis not present

## 2021-10-05 DIAGNOSIS — I10 Essential (primary) hypertension: Secondary | ICD-10-CM | POA: Diagnosis not present

## 2021-10-05 DIAGNOSIS — I4819 Other persistent atrial fibrillation: Secondary | ICD-10-CM | POA: Diagnosis not present

## 2021-10-05 MED ORDER — SPIRONOLACTONE 25 MG PO TABS: 12.5000 mg | ORAL_TABLET | Freq: Every day | ORAL | 3 refills | Status: DC

## 2021-10-05 NOTE — Progress Notes (Signed)
    SUBJECTIVE:   CHIEF COMPLAINT / HPI:   Ms. Bianca [name] is here for annual exam and to meet PCP.  Atrial fibrillation Follows w/ cardiology. Rate controlled. No palpitations or shortness of breath. Takes Eliquis 5 mg daily. CHADSVASC score   HTN Amlodipine-Benazepril, Spironolactone, and Lasix Does not check BP at home. No dizziness or falls.  Chronic diastolic heart failure No swelling or extra fluid symptoms.   She is euvolemic. Chronic shortness of breath.  Walking is limited by knee pain.  Using a cane regularly.  Weight loss/fatigue Ongoing for years. Lost 8 lb since visit in 03/20/2021 and about 20 lb in past year. Has an ensure in the morning followed by a light meal later in the day and dinner at night. Denies any abdominal pain, changes in stool patterns, difficulty urinating. No issues with ambulation.  Generally, she says she feels well but has some fatigue and wonders if her vitamin levels could be low. Denies depressed mood. Has family support. No falls. Completes all ADLs on her own including bathing and cooking. No longer driving.  Would like flu vaccine today.  PERTINENT  PMH / PSH: Reviewed  OBJECTIVE:   BP 124/78   Pulse 63   Ht '5\' 6"'$  (1.676 m)   Wt 175 lb (79.4 kg)   SpO2 96%   BMI 28.25 kg/m   General: Well-appearing elderly female, no acute distress HEENT: Pupils PERRLA, EOMI CV: In atrial fibrillation, regular rate Respiratory: Normal work of breathing on room air, no wheezing or crackles Abdomen: Soft, NTND Ext: Warm, well-perfused, no swelling  ASSESSMENT/PLAN:   HYPERTENSION, BENIGN ESSENTIAL BP well-controlled, 124/78. Continue current regimen: Amlodipine-Benazepril, Spironolactone, Lasix Collected BMP today. Will f/u results  Atrial fibrillation Rate well-controlled. On Eliquis 5 mg daily.  Weight loss Steady weight loss. No red flag symptoms today, especially for a malignancy. She eats a generally low calorie diet, and I suspect  that her symptoms are due to normal aging. I do not think she is suffering from depression. PHQ-9 was 9, #9 negative and feels that her symptoms are not difficult at all to get through daily life. Will obtain TSH, vitamin b12, folate, CBC today.  Healthcare maintenance Has already received annual flu vaccination. Discussed receiving new COVID booster vaccine.     Orvis Brill, [place]

## 2021-10-05 NOTE — Assessment & Plan Note (Addendum)
Has already received annual flu vaccination. Discussed receiving new COVID booster vaccine.

## 2021-10-05 NOTE — Assessment & Plan Note (Signed)
Steady weight loss. No red flag symptoms today, especially for a malignancy. She eats a generally low calorie diet, and I suspect that her symptoms are due to normal aging. I do not think she is suffering from depression. PHQ-9 was 9, #9 negative and feels that her symptoms are not difficult at all to get through daily life. Will obtain TSH, vitamin b12, folate, CBC today.

## 2021-10-05 NOTE — Assessment & Plan Note (Signed)
Rate well-controlled. On Eliquis 5 mg daily.

## 2021-10-05 NOTE — Patient Instructions (Addendum)
It was great seeing you today.  We will plan to see you again in one month. Please schedule an appointment at the front desk before leaving.  We collected labs today. I will let you know if they are abnormal.   If you have any questions or concerns, please feel free to call the clinic.    Be well,  Dr. Orvis Brill [facility] [phone]

## 2021-10-05 NOTE — Assessment & Plan Note (Signed)
BP well-controlled, 124/78. Continue current regimen: Amlodipine-Benazepril, Spironolactone, Lasix Collected BMP today. Will f/u results

## 2021-10-06 LAB — BASIC METABOLIC PANEL
BUN/Creatinine Ratio: 24 (ref 12–28)
BUN: 16 mg/dL (ref 8–27)
CO2: 26 mmol/L (ref 20–29)
Calcium: 10.1 mg/dL (ref 8.7–10.3)
Chloride: 101 mmol/L (ref 96–106)
Creatinine, Ser: 0.67 mg/dL (ref 0.57–1.00)
Glucose: 99 mg/dL (ref 70–99)
Potassium: 4.3 mmol/L (ref 3.5–5.2)
Sodium: 138 mmol/L (ref 134–144)
eGFR: 83 mL/min/{1.73_m2} (ref >59)

## 2021-10-06 LAB — CBC
Hematocrit: 37.9 % (ref 34.0–46.6)
Hemoglobin: 12.5 g/dL (ref 11.1–15.9)
MCH: 30 pg (ref 26.6–33.0)
MCHC: 33 g/dL (ref 31.5–35.7)
MCV: 91 fL (ref 79–97)
Platelets: 184 10*3/uL (ref 150–450)
RBC: 4.17 x10E6/uL (ref 3.77–5.28)
RDW: 13 % (ref 11.7–15.4)
WBC: 5 10*3/uL (ref 3.4–10.8)

## 2021-10-06 LAB — TSH: TSH: 0.622 u[IU]/mL (ref 0.450–4.500)

## 2021-10-06 LAB — FOLATE: Folate: 18.7 ng/mL (ref >3.0)

## 2021-10-06 LAB — VITAMIN B12: Vitamin B-12: 644 pg/mL (ref 232–1245)

## 2021-10-08 ENCOUNTER — Encounter: Payer: Self-pay | Admitting: Student

## 2021-10-09 ENCOUNTER — Ambulatory Visit: Payer: Medicare Other

## 2021-10-11 DIAGNOSIS — Z1152 Encounter for screening for COVID-19: Secondary | ICD-10-CM | POA: Diagnosis not present

## 2021-10-15 DIAGNOSIS — M170 Bilateral primary osteoarthritis of knee: Secondary | ICD-10-CM | POA: Diagnosis not present

## 2021-10-19 DIAGNOSIS — U071 COVID-19: Secondary | ICD-10-CM | POA: Diagnosis not present

## 2021-10-22 DIAGNOSIS — M170 Bilateral primary osteoarthritis of knee: Secondary | ICD-10-CM | POA: Diagnosis not present

## 2021-10-24 ENCOUNTER — Ambulatory Visit (HOSPITAL_COMMUNITY)
Admission: EM | Admit: 2021-10-24 | Discharge: 2021-10-24 | Disposition: A | Discharge status: Home / Self Care | Payer: Medicare Other

## 2021-10-24 ENCOUNTER — Emergency Department (HOSPITAL_COMMUNITY)
Admission: EM | Admit: 2021-10-24 | Discharge: 2021-10-25 | Disposition: A | Discharge status: Home / Self Care | Payer: Medicare Other | Attending: Emergency Medicine | Admitting: Emergency Medicine

## 2021-10-24 ENCOUNTER — Other Ambulatory Visit: Payer: Self-pay

## 2021-10-24 ENCOUNTER — Emergency Department (HOSPITAL_COMMUNITY): Payer: Medicare Other

## 2021-10-24 ENCOUNTER — Encounter (HOSPITAL_COMMUNITY): Payer: Self-pay | Admitting: *Deleted

## 2021-10-24 DIAGNOSIS — Z7901 Long term (current) use of anticoagulants: Secondary | ICD-10-CM | POA: Diagnosis not present

## 2021-10-24 DIAGNOSIS — R200 Anesthesia of skin: Secondary | ICD-10-CM | POA: Diagnosis present

## 2021-10-24 DIAGNOSIS — K5900 Constipation, unspecified: Secondary | ICD-10-CM | POA: Insufficient documentation

## 2021-10-24 DIAGNOSIS — K838 Other specified diseases of biliary tract: Secondary | ICD-10-CM | POA: Diagnosis not present

## 2021-10-24 DIAGNOSIS — I509 Heart failure, unspecified: Secondary | ICD-10-CM | POA: Diagnosis not present

## 2021-10-24 DIAGNOSIS — N281 Cyst of kidney, acquired: Secondary | ICD-10-CM

## 2021-10-24 DIAGNOSIS — N200 Calculus of kidney: Secondary | ICD-10-CM | POA: Diagnosis not present

## 2021-10-24 DIAGNOSIS — I110 Hypertensive heart disease with heart failure: Secondary | ICD-10-CM | POA: Insufficient documentation

## 2021-10-24 DIAGNOSIS — Z79899 Other long term (current) drug therapy: Secondary | ICD-10-CM | POA: Diagnosis not present

## 2021-10-24 DIAGNOSIS — N2889 Other specified disorders of kidney and ureter: Secondary | ICD-10-CM | POA: Diagnosis not present

## 2021-10-24 DIAGNOSIS — R202 Paresthesia of skin: Secondary | ICD-10-CM | POA: Diagnosis not present

## 2021-10-24 DIAGNOSIS — M4802 Spinal stenosis, cervical region: Secondary | ICD-10-CM | POA: Diagnosis not present

## 2021-10-24 DIAGNOSIS — M5412 Radiculopathy, cervical region: Secondary | ICD-10-CM

## 2021-10-24 DIAGNOSIS — Z1152 Encounter for screening for COVID-19: Secondary | ICD-10-CM | POA: Diagnosis not present

## 2021-10-24 DIAGNOSIS — R531 Weakness: Secondary | ICD-10-CM | POA: Diagnosis not present

## 2021-10-24 DIAGNOSIS — I6381 Other cerebral infarction due to occlusion or stenosis of small artery: Secondary | ICD-10-CM | POA: Diagnosis not present

## 2021-10-24 DIAGNOSIS — I10 Essential (primary) hypertension: Secondary | ICD-10-CM | POA: Diagnosis not present

## 2021-10-24 LAB — CBC WITH DIFFERENTIAL/PLATELET
Abs Immature Granulocytes: 0.02 10*3/uL (ref 0.00–0.07)
Basophils Absolute: 0 10*3/uL (ref 0.0–0.1)
Basophils Relative: 1 %
Eosinophils Absolute: 0 10*3/uL (ref 0.0–0.5)
Eosinophils Relative: 0 %
HCT: 39.7 % (ref 36.0–46.0)
Hemoglobin: 12.5 g/dL (ref 12.0–15.0)
Immature Granulocytes: 0 %
Lymphocytes Relative: 36 %
Lymphs Abs: 1.9 10*3/uL (ref 0.7–4.0)
MCH: 29.9 pg (ref 26.0–34.0)
MCHC: 31.5 g/dL (ref 30.0–36.0)
MCV: 95 fL (ref 80.0–100.0)
Monocytes Absolute: 0.4 10*3/uL (ref 0.1–1.0)
Monocytes Relative: 8 %
Neutro Abs: 2.8 10*3/uL (ref 1.7–7.7)
Neutrophils Relative %: 55 %
Platelets: 199 10*3/uL (ref 150–400)
RBC: 4.18 MIL/uL (ref 3.87–5.11)
RDW: 14.4 % (ref 11.5–15.5)
WBC: 5.1 10*3/uL (ref 4.0–10.5)
nRBC: 0 % (ref 0.0–0.2)

## 2021-10-24 LAB — TROPONIN I (HIGH SENSITIVITY): Troponin I (High Sensitivity): 7 ng/L (ref <18)

## 2021-10-24 LAB — COMPREHENSIVE METABOLIC PANEL
ALT: 16 U/L (ref 0–44)
AST: 26 U/L (ref 15–41)
Albumin: 3.8 g/dL (ref 3.5–5.0)
Alkaline Phosphatase: 62 U/L (ref 38–126)
Anion gap: 7 (ref 5–15)
BUN: 16 mg/dL (ref 8–23)
CO2: 26 mmol/L (ref 22–32)
Calcium: 9.7 mg/dL (ref 8.9–10.3)
Chloride: 105 mmol/L (ref 98–111)
Creatinine, Ser: 0.68 mg/dL (ref 0.44–1.00)
GFR, Estimated: >60 mL/min (ref >60)
Glucose, Bld: 95 mg/dL (ref 70–99)
Potassium: 4.1 mmol/L (ref 3.5–5.1)
Sodium: 138 mmol/L (ref 135–145)
Total Bilirubin: 1 mg/dL (ref 0.3–1.2)
Total Protein: 6.9 g/dL (ref 6.5–8.1)

## 2021-10-24 LAB — MAGNESIUM: Magnesium: 2.2 mg/dL (ref 1.7–2.4)

## 2021-10-24 LAB — TSH: TSH: 0.771 u[IU]/mL (ref 0.350–4.500)

## 2021-10-24 NOTE — ED Triage Notes (Signed)
Pt reports she has been having generalized weakness; has been f/u with PCP and has had some lab work done; states since yesterday started with LUE weakness. Also reports having sustained insect bite to left posteror neck area yesterday. Denies any HA, dizziness, balance issues, vision changes or changes in BLE weakness. Pt takes Eloquis, but denies any falls or injuries.

## 2021-10-24 NOTE — ED Notes (Signed)
Patient is being discharged from the Urgent Care and sent to the Emergency Department via private vehicle with daughter . Per E. Raspet, PA, patient is in need of higher level of care due to LUE weakness. Patient is aware and verbalizes understanding of plan of care.  Vitals:   10/24/21 1647  BP: (!) 159/85  Pulse: 74  Resp: 18  Temp: 97.7 F (36.5 C)  SpO2: 100%

## 2021-10-24 NOTE — ED Provider Notes (Signed)
I was called into triage to evaluate patient.  She reports a 1 day history of left upper extremity weakness, numbness, paresthesias.  Code stroke was not activated as patient has been symptomatic for 1 day.  She does have a history of A-fib and is taking Eliquis; reports compliance.  She denies any headache, dizziness, vision change, nausea, vomiting.  She denies any recent falls or injuries.  On exam no significant focal weakness, however, given her history and new onset of neurological conditions recommended that she go to the emergency room for further evaluation and management.  She is agreeable to this will have her daughter who accompanied her to the visit take her directly to [facility], ER.  She is stable at the time of discharge.   Terrilee Croak, PA-C 10/24/21 1658

## 2021-10-24 NOTE — ED Provider Triage Note (Signed)
Emergency Medicine Provider Triage Evaluation Note  Bianca [name] , a 88 y.o. female  was evaluated in triage.  Pt complains of generalized weakness and left arm weakness and paresthesias.  Patient reports that the left arm weakness and paresthesia started yesterday.  Patient reports for the past 2 weeks she has had generalized weakness and fatigue.  Saw PCP with negative blood work at that time.  Patient denies chest pain or shortness of breath.  Denies any headache or vision changes.  Denies any trauma.  Patient reports that she has received her COVID and flu vaccine about a week ago and unsure if this is causing her symptoms.  Patient also reports having a extensive cardiac history but denies any chest pain or shortness of breath.  Patient reports having a insect bite her neck yesterday and she is unsure if this is related to her left arm weakness..  Review of Systems  Positive: Weakness, fatigue, left arm paresthesias Negative: Chest pain, shortness breath, fevers  Physical Exam  BP (!) 156/81 (BP Location: Right Arm)   Pulse 96   Temp 98.6 F (37 C) (Oral)   Resp 15   Ht '5\' 6"'$  (1.676 m)   Wt 79.4 kg   SpO2 100%   BMI 28.25 kg/m  Gen:   Awake, no distress   Resp:  Normal effort  MSK:   Moves extremities without difficulty  Other:  The patient is alert, attentive, and oriented x 3. Speech is clear. Cranial nerve II-VII grossly intact. Negative pronator drift. Sensation intact. Strength 5/5 in all extremities. Rapid alternating movement and fine finger movements intact.  Lungs clear to auscultation bilaterally.  Heart with irregularly irregular rhythm consistent with A-fib   Medical Decision Making  Medically screening exam initiated at 6:12 PM.  Appropriate orders placed.  [name] was informed that the remainder of the evaluation will be completed by another provider, this initial triage assessment does not replace that evaluation, and the importance of remaining in  the ED until their evaluation is complete.  Patient presents for left arm weakness and paresthesias.  She also ports generalized weakness and fatigue.  Patient with normal neurological exam.  Cardiac work-up initiated.  We will also obtain imaging of head and cervical spine although patient is not a code stroke.   Doristine Devoid, PA-C 10/24/21 1814

## 2021-10-24 NOTE — ED Triage Notes (Signed)
The pt is c/o lt arm weakness since yesterday  she has not had a fall or injury  she saw her doctor 2 weeks ago and her blood work was normal  today the lt arm is feeling strange alert no distress

## 2021-10-25 ENCOUNTER — Emergency Department (HOSPITAL_COMMUNITY): Payer: Medicare Other

## 2021-10-25 DIAGNOSIS — M4802 Spinal stenosis, cervical region: Secondary | ICD-10-CM | POA: Diagnosis not present

## 2021-10-25 DIAGNOSIS — N2889 Other specified disorders of kidney and ureter: Secondary | ICD-10-CM | POA: Diagnosis not present

## 2021-10-25 DIAGNOSIS — Z1152 Encounter for screening for COVID-19: Secondary | ICD-10-CM | POA: Diagnosis not present

## 2021-10-25 DIAGNOSIS — R531 Weakness: Secondary | ICD-10-CM | POA: Diagnosis not present

## 2021-10-25 DIAGNOSIS — K838 Other specified diseases of biliary tract: Secondary | ICD-10-CM | POA: Diagnosis not present

## 2021-10-25 LAB — TROPONIN I (HIGH SENSITIVITY): Troponin I (High Sensitivity): 8 ng/L (ref <18)

## 2021-10-25 LAB — SARS CORONAVIRUS 2 BY RT PCR: SARS Coronavirus 2 by RT PCR: NEGATIVE

## 2021-10-25 LAB — URINALYSIS, ROUTINE W REFLEX MICROSCOPIC
Bilirubin Urine: NEGATIVE
Glucose, UA: NEGATIVE mg/dL
Hgb urine dipstick: NEGATIVE
Ketones, ur: NEGATIVE mg/dL
Leukocytes,Ua: NEGATIVE
Nitrite: NEGATIVE
Protein, ur: NEGATIVE mg/dL
Specific Gravity, Urine: 1.045 — ABNORMAL HIGH (ref 1.005–1.030)
pH: 5 (ref 5.0–8.0)

## 2021-10-25 MED ORDER — LORAZEPAM 2 MG/ML IJ SOLN
1.0000 mg | Freq: Once | INTRAMUSCULAR | Status: AC
  Administered 2021-10-25: 1 mg via INTRAVENOUS
  Filled 2021-10-25: qty 1

## 2021-10-25 MED ORDER — PREDNISONE 20 MG PO TABS
20.0000 mg | ORAL_TABLET | Freq: Once | ORAL | Status: AC
  Administered 2021-10-25: 20 mg via ORAL
  Filled 2021-10-25: qty 1

## 2021-10-25 MED ORDER — PREDNISONE 20 MG PO TABS: 20.0000 mg | ORAL_TABLET | Freq: Every day | ORAL | 0 refills | Status: DC

## 2021-10-25 MED ORDER — BENAZEPRIL HCL 20 MG PO TABS
20.0000 mg | ORAL_TABLET | Freq: Every day | ORAL | Status: DC
  Administered 2021-10-25: 20 mg via ORAL
  Filled 2021-10-25: qty 1

## 2021-10-25 MED ORDER — SODIUM CHLORIDE 0.9 % IV SOLN: INTRAVENOUS | Status: DC

## 2021-10-25 MED ORDER — APIXABAN 5 MG PO TABS
5.0000 mg | ORAL_TABLET | Freq: Once | ORAL | Status: AC
  Administered 2021-10-25: 5 mg via ORAL
  Filled 2021-10-25: qty 1

## 2021-10-25 MED ORDER — IOHEXOL 350 MG/ML SOLN
75.0000 mL | Freq: Once | INTRAVENOUS | Status: AC | PRN
  Administered 2021-10-25: 75 mL via INTRAVENOUS

## 2021-10-25 MED ORDER — AMLODIPINE BESYLATE 5 MG PO TABS
5.0000 mg | ORAL_TABLET | Freq: Once | ORAL | Status: AC
  Administered 2021-10-25: 5 mg via ORAL
  Filled 2021-10-25: qty 1

## 2021-10-25 MED ORDER — METHOCARBAMOL 500 MG PO TABS: 500.0000 mg | ORAL_TABLET | Freq: Three times a day (TID) | ORAL | 0 refills | Status: DC | PRN

## 2021-10-25 MED ORDER — PREDNISONE 20 MG PO TABS: ORAL_TABLET | ORAL | 0 refills | Status: DC

## 2021-10-25 MED ORDER — HYDROCORTISONE 1 % EX CREA
TOPICAL_CREAM | Freq: Once | CUTANEOUS | Status: AC
  Administered 2021-10-25: 1 via TOPICAL
  Filled 2021-10-25: qty 28

## 2021-10-25 NOTE — ED Notes (Signed)
Pt transported to MRI 

## 2021-10-25 NOTE — Discharge Instructions (Addendum)
You have a pinched nerve in your neck causing your numbness and pain.  I have discussed case with the neurosurgeon who recommend that you take steroids as prescribed and follow-up with him in the office.  You can also take Robaxin for muscle spasms  See your doctor for follow-up  You have a renal cyst that can be followed up with your doctor   Return to ER if you have worse numbness or weakness or trouble walking

## 2021-10-25 NOTE — ED Provider Notes (Signed)
  Physical Exam  BP (!) 140/86   Pulse 63   Temp 98.3 F (36.8 C) (Oral)   Resp (!) 24   Ht '5\' 6"'$  (1.676 m)   Wt 79.4 kg   SpO2 100%   BMI 28.25 kg/m   Physical Exam  Procedures  Procedures  ED Course / MDM    Medical Decision Making Care assumed at 3 PM.  Patient is here with left arm numbness and pain.  Patient also has some lower abdominal pain.  Patient had extensive work-up in the ED including an unremarkable MRI brain and MRI cervical spine that showed C3-4 arthritis with spinal stenosis and cord compression but no obvious cord signal abnormality.  CT abdomen pelvis was unremarkable.  Patient is ambulatory.  Patient signed out pending urinalysis  4:55 PM Patient's urinalysis was unremarkable.  I reviewed the MRI images and it showed C3-4 severe canal stenosis with cord compression.  I consulted Glenford Peers, NP from neurosurgery.  Since patient is a poor surgical candidate and is on Eliquis as well, he does recommend steroids and outpatient follow-up.  Patient also has incidental renal cyst that can be followed up outpatient.  Stable for discharge.  Problems Addressed: Cervical stenosis of spine: acute illness or injury Constipation, unspecified constipation type: acute illness or injury Renal cyst: undiagnosed new problem with uncertain prognosis  Amount and/or Complexity of Data Reviewed Labs: ordered. Decision-making details documented in ED Course. Radiology: ordered and independent interpretation performed. Decision-making details documented in ED Course. ECG/medicine tests: ordered and independent interpretation performed. Decision-making details documented in ED Course.  Risk Prescription drug management.          Drenda Freeze, MD 10/25/21 [phone]

## 2021-10-25 NOTE — ED Provider Notes (Signed)
[facility] EMERGENCY DEPARTMENT Provider Note   CSN: [id number] Arrival date & time: 10/24/21  1704     History  Chief Complaint  Patient presents with   Weakness    Bianca [name] is a 88 y.o. female.  Pt is a 88 yo female with a pmhx significant for HTN, OA, CHF, GERD, afib (on Eliquis), and mild pulmonary htn.  Pt has been feeling weak for about 2 weeks.  She did see her doctor who did blood work and all was ok.  Unfortunately, pt had to wait 17 hrs to be seen.  Sx started yesterday when she came in.  She said her left arm and felt weak and numb since she woke up.  No leg issues.  No trouble speaking or swallowing.  She also feels weak and nauseous after she eats.  She has not had fevers.  She has not been eating as much since she feels nauseous.       Home Medications Prior to Admission medications   Medication Sig Start Date End Date Taking? Authorizing Provider  amLODipine-benazepril (LOTREL) 5-20 MG capsule TAKE 1 CAPSULE BY MOUTH DAILY 09/24/21  Yes Dameron, Luna Fuse, DO  apixaban (ELIQUIS) 5 MG TABS tablet TAKE 1 TABLET BY MOUTH TWICE  DAILY Patient taking differently: Take 5 mg by mouth 2 (two) times daily. 09/24/21  Yes Jettie Booze, MD  calcium-vitamin D (OSCAL WITH D 500-200) 500-200 MG-UNIT per tablet Take 1 tablet by mouth daily.   Yes [provider]  carboxymethylcellulose (REFRESH PLUS) 0.5 % SOLN Place 1 drop into both eyes daily as needed (dry eyes).   Yes [provider]  Cholecalciferol (VITAMIN D-3) 1000 UNITS CAPS Take 1,000 Units by mouth daily.    Yes [provider]  fish oil-omega-3 fatty acids 1000 MG capsule Take 2 g by mouth daily.   Yes [provider]  furosemide (LASIX) 40 MG tablet Take 1.5 tablets (60 mg total) by mouth daily. Patient taking differently: Take 40 mg by mouth daily. 09/24/21  Yes Dameron, Luna Fuse, DO  hydrocortisone cream 1 % Apply 1 application topically 2 (two) times  daily. Only on face Patient taking differently: Apply 1 application  topically 2 (two) times daily as needed for itching. 04/08/19  Yes Tammi Klippel, Sherin, DO  latanoprost (XALATAN) 0.005 % ophthalmic solution Place 1 drop into the left eye at bedtime. 06/15/21  Yes [provider]  MAPAP ARTHRITIS PAIN 650 MG CR tablet TAKE 1 TABLET BY MOUTH  EVERY 8 HOURS AS NEEDED FOR PAIN Patient taking differently: Take 650 mg by mouth every 8 (eight) hours as needed for pain. 07/31/17  Yes Bufford Lope, DO  Multiple Vitamins-Minerals (MULTI FOR HER 50+) TABS Take 1 tablet by mouth daily.   Yes [provider]  nitroGLYCERIN (NITROSTAT) 0.4 MG SL tablet DISSOLVE 1 TABLET UNDER THE TONGUE EVERY 5 MINUTES AS  NEEDED FOR CHEST PAIN. MAX  OF 3 TABLETS IN 15 MINUTES. CALL 911 IF PAIN PERSISTS. Patient taking differently: Place 0.4 mg under the tongue every 5 (five) minutes as needed for chest pain. 05/16/21  Yes Jettie Booze, MD  nystatin-triamcinolone [facility]) cream Apply to under breasts and on abdomen twice daily 03/18/19  Yes Lattie Haw, MD  Specialty Vitamins Products ([employer] HAIR/SKIN/NAILS) TABS Take 1 tablet by mouth daily.   Yes [provider]  spironolactone (ALDACTONE) 25 MG tablet Take 0.5 tablets (12.5 mg total) by mouth daily. 10/05/21  Yes Dameron, Luna Fuse,  DO  predniSONE (DELTASONE) 20 MG tablet Take 1 tablet (20 mg total) by mouth daily. 10/25/21  Yes Isla Pence, MD      Allergies    Hydrocodone, Morphine, and Penicillins    Review of Systems   Review of Systems  Gastrointestinal:  Positive for abdominal pain.  Neurological:  Positive for weakness and numbness.  All other systems reviewed and are negative.   Physical Exam Updated Vital Signs BP (!) 148/74   Pulse 77   Temp 98.2 F (36.8 C) (Oral)   Resp 19   Ht '5\' 6"'$  (1.676 m)   Wt 79.4 kg   SpO2 100%   BMI 28.25 kg/m  Physical Exam Vitals and nursing note reviewed.  Constitutional:       Appearance: Normal appearance.  HENT:     Head: Normocephalic and atraumatic.     Right Ear: External ear normal.     Left Ear: External ear normal.     Nose: Nose normal.     Mouth/Throat:     Mouth: Mucous membranes are moist.     Pharynx: Oropharynx is clear.  Eyes:     Extraocular Movements: Extraocular movements intact.     Conjunctiva/sclera: Conjunctivae normal.     Pupils: Pupils are equal, round, and reactive to light.  Cardiovascular:     Rate and Rhythm: Regular rhythm.     Pulses: Normal pulses.     Heart sounds: Normal heart sounds.  Pulmonary:     Effort: Pulmonary effort is normal.     Breath sounds: Normal breath sounds.  Abdominal:     General: Abdomen is flat. Bowel sounds are normal.     Palpations: Abdomen is soft.  Musculoskeletal:        General: Normal range of motion.     Cervical back: Normal range of motion and neck supple.  Skin:    General: Skin is warm.     Capillary Refill: Capillary refill takes less than 2 seconds.  Neurological:     General: No focal deficit present.     Mental Status: She is alert and oriented to person, place, and time.  Psychiatric:        Mood and Affect: Mood normal.        Behavior: Behavior normal.     ED Results / Procedures / Treatments   Labs (all labs ordered are listed, but only abnormal results are displayed) Labs Reviewed  SARS CORONAVIRUS 2 BY RT PCR  CBC WITH DIFFERENTIAL/PLATELET  COMPREHENSIVE METABOLIC PANEL  MAGNESIUM  TSH  URINALYSIS, ROUTINE W REFLEX MICROSCOPIC  TROPONIN I (HIGH SENSITIVITY)  TROPONIN I (HIGH SENSITIVITY)    EKG EKG Interpretation  Date/Time:  Wednesday October 24 2021 18:16:33 EDT Ventricular Rate:  99 PR Interval:    QRS Duration: 110 QT Interval:  384 QTC Calculation: 492 R Axis:   -45 Text Interpretation: Atrial fibrillation Incomplete right bundle branch block Left anterior fascicular block Moderate voltage criteria for LVH, may be normal variant ( R in aVL ,  [place] product ) Nonspecific ST and T wave abnormality Abnormal ECG When compared with ECG of 10-Nov-2012 10:24, No significant change was found Confirmed by Delora Fuel ([id number]) on 10/24/2021 11:10:37 PM  Radiology MR Cervical Spine Wo Contrast  Result Date: 10/25/2021 CLINICAL DATA:  Generalized weakness and left arm weakness/paresthesia. EXAM: MRI CERVICAL SPINE WITHOUT CONTRAST TECHNIQUE: Multiplanar, multisequence MR imaging of the cervical spine was performed. No intravenous contrast was administered. COMPARISON:  CT cervical spine 1  day prior. FINDINGS: Image quality is degraded by motion artifact. Alignment: There is trace anterolisthesis of C3 on C4. There is no other malalignment. Vertebrae: Vertebral body heights are preserved. Background marrow signal is normal. There is degenerative endplate marrow signal abnormality with edema along the right aspect of the C3-C4 disc space. There is T1 and T2 hyperintensity in the T2 vertebral body likely reflecting a prominent intraosseous hemangioma. There is no suspicious marrow signal abnormality. There is no other marrow edema. Cord: Suboptimally evaluated due to motion artifact. Within this confine, there is no definite cord signal abnormality. Posterior Fossa, vertebral arteries, paraspinal tissues: The posterior fossa is assessed on the separately dictated brain MRI. The vertebral artery flow voids are normal. The paraspinal soft tissues are unremarkable. Disc levels: Evaluation of the neural foramina is significantly degraded by motion artifact. C2-C3: Mild uncovertebral and facet arthropathy without significant spinal canal or neural foraminal stenosis C3-C4: There is posterior disc osteophyte complex, prominent bilateral uncovertebral ridging, and bilateral facet arthropathy resulting in severe spinal canal stenosis with cord compression but no definite cord signal abnormality, and severe left and moderate right neural foraminal stenosis. There is a  prominent right-sided facet joint effusion at this level. C4-C5: There is uncovertebral ridging and bilateral facet arthropathy resulting in moderate to severe left and moderate right neural foraminal stenosis without significant spinal canal stenosis. C5-C6: There is mild bilateral uncovertebral ridging and facet arthropathy resulting in mild-to-moderate left and mild right neural foraminal stenosis without significant spinal canal stenosis C6-C7: There is a prominent posterior disc osteophyte complex with right worse than left uncovertebral ridging and mild facet arthropathy resulting in at least moderate right and mild left neural foraminal stenosis and mild spinal canal stenosis C7-T1: No high-grade spinal canal or neural foraminal stenosis. IMPRESSION: 1. Image quality is degraded by motion artifact, particularly affecting the axial sequences. 2. Degenerative changes at C3-C4 result in severe spinal canal stenosis with cord compression but no definite cord signal abnormality, and severe left and moderate right neural foraminal stenosis. Prominent right-sided facet joint effusion as well as degenerative marrow edema at this level which could reflect a source of pain. 3. Moderate to severe left and moderate right neural foraminal stenosis at C4-C5. 4. At least moderate right and mild left neural foraminal stenosis and mild spinal canal stenosis at C6-C7. Electronically Signed   By: Valetta Mole M.D.   On: 10/25/2021 15:10   MR BRAIN WO CONTRAST  Result Date: 10/25/2021 CLINICAL DATA:  Generalized weakness and left arm weakness, paresthesias. EXAM: MRI HEAD WITHOUT CONTRAST TECHNIQUE: Multiplanar, multiecho pulse sequences of the brain and surrounding structures were obtained without intravenous contrast. COMPARISON:  CT head 1 day prior. FINDINGS: Brain: There is no acute intracranial hemorrhage, extra-axial fluid collection, or acute infarct. There is mild for age background parenchymal volume loss with  prominence of the ventricular system and extra-axial CSF spaces. There is patchy FLAIR signal abnormality throughout the supratentorial white matter likely reflecting sequela of moderate chronic small vessel ischemic change. There are small remote lacunar infarcts in the left caudate head and bilateral cerebellar hemispheres. There is no suspicious parenchymal signal abnormality. There is no mass lesion. There is no mass effect or midline shift. Vascular: Normal flow voids. Skull and upper cervical spine: Normal marrow signal. Sinuses/Orbits: The paranasal sinuses are clear. Bilateral lens implants are in place. The globes and orbits are otherwise unremarkable. Other: None. IMPRESSION: 1. No acute intracranial pathology. 2. Mild background parenchymal volume loss and moderate chronic  small vessel ischemic change. Electronically Signed   By: Valetta Mole M.D.   On: 10/25/2021 14:58   CT ABDOMEN PELVIS W CONTRAST  Result Date: 10/25/2021 CLINICAL DATA:  Diffuse abdominal pain and generalized weakness. Acute, nonlocalized abdominal pain. EXAM: CT ABDOMEN AND PELVIS WITH CONTRAST TECHNIQUE: Multidetector CT imaging of the abdomen and pelvis was performed using the standard protocol following bolus administration of intravenous contrast. RADIATION DOSE REDUCTION: This exam was performed according to the departmental dose-optimization program which includes automated exposure control, adjustment of the mA and/or kV according to patient size and/or use of iterative reconstruction technique. CONTRAST:  43m OMNIPAQUE IOHEXOL 350 MG/ML SOLN COMPARISON:  None Available. FINDINGS: Lower chest: Mild curvilinear subsegmental atelectasis versus scarring within the lung bases. Moderate cardiomegaly. Dense coronary artery calcifications. Hepatobiliary: Smooth liver contours. The gallbladder is not well visualized and may be surgically absent. There is dilatation of the common bile duct up to approximately 15 mm. There is  moderate central intrahepatic biliary ductal dilatation. Pancreas: No definite pancreatic ductal dilatation is seen. No pancreatic mass or inflammatory fat stranding is seen. Spleen: Normal in size without focal abnormality. Adrenals/Urinary Tract: Normal adrenals. The kidneys enhance uniformly. There are numerous bilateral low-density renal lesions. The largest is seen within the left upper pole, partially exophytic, measuring up to 17 mm, and demonstrating internal fluid density consistent with a cyst. There is a mildly heterogeneous lesion within the left upper pole measuring up to 12 mm (axial series 2, image 25 and coronal series 6, image 59) with internal density of 61 Hounsfield units. This may represent a complex cyst versus solid mass, and recommend ultrasound for further characterization. Other subcentimeter low-density lesions are too small to further characterize. There is a peripheral right renal hilar vascular calcification (coronal image 48 and axial series 3, image 32). No stone is seen within either kidney. There is mild dilatation of the proximal right ureter, however this appears to taper distally, and no obstructing stone is seen. No hydronephrosis within either kidney. The urinary bladder is only mildly distended. There is mild diffuse urinary bladder wall thickening. Stomach/Bowel: There is moderate to high-grade stool seen throughout all segments of the colon, especially the descending and sigmoid colon. The terminal ileum is unremarkable. Normal appendix (axial images 54 through 59). No dilated loops of bowel to indicate bowel obstruction. Vascular/Lymphatic: No abdominal aortic aneurysm. High-grade atherosclerotic calcifications. The major intra-abdominal aortic branch vessels are patent. No mesenteric, retroperitoneal, or pelvic pathologically enlarged lymph nodes by CT criteria. Reproductive: The uterus appears to be surgically absent. No gross adnexal abnormality. Other: Small fat  containing umbilical hernia. No free air or free fluid is seen within the abdomen or pelvis. Musculoskeletal: Mild multilevel degenerative disc and endplate changes throughout the visualized thoracolumbar spine. Severe pubic symphysis joint space narrowing and peripheral osteophytosis. IMPRESSION: 1. Moderate to high-grade stool seen throughout all segments of the colon, especially the distal colon. This is compatible with constipation. No bowel obstruction. 2. The gallbladder is not well visualized and may be surgically absent. Moderate central intrahepatic biliary ductal dilatation and dilatation of the common bile duct up to 15 mm. This may be secondary to prior cholecystectomy postsurgical stasis, however is indeterminate. Recommend correlation with liver enzymes. 3. There is a 12 mm mildly heterogeneous lesion within the upper pole of the left kidney. This may represent a complex cyst versus solid mass (renal cell carcinoma is possible), and recommend ultrasound for further characterization. 4. Moderate cardiomegaly. 5. High-grade atherosclerotic calcifications of  the abdominal aorta and its major branch vessels. Aortic Atherosclerosis (ICD10-I70.0). Electronically Signed   By: Yvonne Kendall M.D.   On: 10/25/2021 13:07   CT Head Wo Contrast  Result Date: 10/24/2021 CLINICAL DATA:  left arm weakness and paresthesia EXAM: CT HEAD WITHOUT CONTRAST CT CERVICAL SPINE WITHOUT CONTRAST TECHNIQUE: Multidetector CT imaging of the head and cervical spine was performed following the standard protocol without intravenous contrast. Multiplanar CT image reconstructions of the cervical spine were also generated. RADIATION DOSE REDUCTION: This exam was performed according to the departmental dose-optimization program which includes automated exposure control, adjustment of the mA and/or kV according to patient size and/or use of iterative reconstruction technique. COMPARISON:  None Available. FINDINGS: CT HEAD FINDINGS  Brain: Empty sella noted, an anatomic variant. Parenchymal volume loss is commensurate with the patient's age. Mild periventricular white matter changes are present likely reflecting the sequela of small vessel ischemia. Remote lacunar infarcts are noted within the cerebellar hemispheres bilaterally. No abnormal intra or extra-axial mass lesion or fluid collection. No abnormal mass effect or midline shift. No evidence of acute intracranial hemorrhage or infarct. Ventricular size is normal. Cerebellum unremarkable. Vascular: No asymmetric hyperdense vasculature at the skull base. Extensive atherosclerotic calcification within the carotid siphons. Skull: Intact Sinuses/Orbits: Paranasal sinuses are clear. Ocular lenses have been removed. Orbits are otherwise unremarkable. Other: Mastoid air cells and middle ear cavities are clear. CT CERVICAL SPINE FINDINGS Alignment: 2 mm anterolisthesis C3-4, likely degenerative in nature. Otherwise normal cervical alignment. Skull base and vertebrae: Craniocervical alignment is normal. The atlantodental interval is not widened. No acute fracture of the cervical spine. Vertebral body height is preserved. Soft tissues and spinal canal: Anterolisthesis in combination with posterior disc osteophyte complex at C3-4 results in moderate central canal stenosis with an AP diameter of 8 mm and mild flattening of the thecal sac. Similar changes are noted at C6-7 with an AP diameter of the spinal canal of approximately 7 mm. No canal hematoma. No prevertebral soft tissue swelling. Disc levels: There is diffuse intervertebral disc space narrowing and endplate remodeling throughout the cervical spine in keeping with changes of diffuse advanced degenerative disc disease. Notable retroperitoneal course of the common carotid arteries. The prevertebral soft tissues are not thickened otherwise on sagittal reformats. There is multilevel advanced uncovertebral and facet arthrosis which results in  multilevel moderate to severe neuroforaminal narrowing, most severe on the left at C3-4. Upper chest: Negative. Other: Asymmetric enlargement of the right thyroid gland is identified, better assessed on sonogram of 11/12/2019 and previously biopsied on 11/23/2019. IMPRESSION: 1. No acute intracranial abnormality. No calvarial fracture. 2. No acute fracture or listhesis of the cervical spine. 3. Advanced multilevel degenerative disc and degenerative joint disease resulting in multilevel moderate to severe neuroforaminal narrowing, most severe on the left at C3-4. 4. Moderate central canal stenosis at C3-4 and C6-7. 5. Asymmetric enlargement of the right thyroid gland, better assessed on sonogram of 11/12/2019 and previously biopsied on 11/23/2019. Electronically Signed   By: Fidela Salisbury M.D.   On: 10/24/2021 19:31   CT Cervical Spine Wo Contrast  Result Date: 10/24/2021 CLINICAL DATA:  left arm weakness and paresthesia EXAM: CT HEAD WITHOUT CONTRAST CT CERVICAL SPINE WITHOUT CONTRAST TECHNIQUE: Multidetector CT imaging of the head and cervical spine was performed following the standard protocol without intravenous contrast. Multiplanar CT image reconstructions of the cervical spine were also generated. RADIATION DOSE REDUCTION: This exam was performed according to the departmental dose-optimization program which includes automated exposure control,  adjustment of the mA and/or kV according to patient size and/or use of iterative reconstruction technique. COMPARISON:  None Available. FINDINGS: CT HEAD FINDINGS Brain: Empty sella noted, an anatomic variant. Parenchymal volume loss is commensurate with the patient's age. Mild periventricular white matter changes are present likely reflecting the sequela of small vessel ischemia. Remote lacunar infarcts are noted within the cerebellar hemispheres bilaterally. No abnormal intra or extra-axial mass lesion or fluid collection. No abnormal mass effect or midline  shift. No evidence of acute intracranial hemorrhage or infarct. Ventricular size is normal. Cerebellum unremarkable. Vascular: No asymmetric hyperdense vasculature at the skull base. Extensive atherosclerotic calcification within the carotid siphons. Skull: Intact Sinuses/Orbits: Paranasal sinuses are clear. Ocular lenses have been removed. Orbits are otherwise unremarkable. Other: Mastoid air cells and middle ear cavities are clear. CT CERVICAL SPINE FINDINGS Alignment: 2 mm anterolisthesis C3-4, likely degenerative in nature. Otherwise normal cervical alignment. Skull base and vertebrae: Craniocervical alignment is normal. The atlantodental interval is not widened. No acute fracture of the cervical spine. Vertebral body height is preserved. Soft tissues and spinal canal: Anterolisthesis in combination with posterior disc osteophyte complex at C3-4 results in moderate central canal stenosis with an AP diameter of 8 mm and mild flattening of the thecal sac. Similar changes are noted at C6-7 with an AP diameter of the spinal canal of approximately 7 mm. No canal hematoma. No prevertebral soft tissue swelling. Disc levels: There is diffuse intervertebral disc space narrowing and endplate remodeling throughout the cervical spine in keeping with changes of diffuse advanced degenerative disc disease. Notable retroperitoneal course of the common carotid arteries. The prevertebral soft tissues are not thickened otherwise on sagittal reformats. There is multilevel advanced uncovertebral and facet arthrosis which results in multilevel moderate to severe neuroforaminal narrowing, most severe on the left at C3-4. Upper chest: Negative. Other: Asymmetric enlargement of the right thyroid gland is identified, better assessed on sonogram of 11/12/2019 and previously biopsied on 11/23/2019. IMPRESSION: 1. No acute intracranial abnormality. No calvarial fracture. 2. No acute fracture or listhesis of the cervical spine. 3. Advanced  multilevel degenerative disc and degenerative joint disease resulting in multilevel moderate to severe neuroforaminal narrowing, most severe on the left at C3-4. 4. Moderate central canal stenosis at C3-4 and C6-7. 5. Asymmetric enlargement of the right thyroid gland, better assessed on sonogram of 11/12/2019 and previously biopsied on 11/23/2019. Electronically Signed   By: Fidela Salisbury M.D.   On: 10/24/2021 19:31   DG Chest 2 View  Result Date: 10/24/2021 CLINICAL DATA:  Left arm weakness EXAM: CHEST - 2 VIEW COMPARISON:  None Available. FINDINGS: Lungs are well expanded, symmetric, and clear. No pneumothorax or pleural effusion. Cardiac size within normal limits. Pulmonary vascularity is normal. Osseous structures are age-appropriate. No acute bone abnormality. Safety pin overlies the left upper extremity proximally. IMPRESSION: No active cardiopulmonary disease. Electronically Signed   By: Fidela Salisbury M.D.   On: 10/24/2021 19:02    Procedures Procedures    Medications Ordered in ED Medications  0.9 %  sodium chloride infusion ( Intravenous New Bag/Given 10/25/21 1146)  benazepril (LOTENSIN) tablet 20 mg (20 mg Oral Given 10/25/21 1047)  predniSONE (DELTASONE) tablet 20 mg (has no administration in time range)  amLODipine (NORVASC) tablet 5 mg (5 mg Oral Given 10/25/21 1047)  apixaban (ELIQUIS) tablet 5 mg (5 mg Oral Given 10/25/21 1047)  hydrocortisone cream 1 % (1 Application Topical Given 10/25/21 1148)  iohexol (OMNIPAQUE) 350 MG/ML injection 75 mL (75 mLs Intravenous  Contrast Given 10/25/21 1226)  LORazepam (ATIVAN) injection 1 mg (1 mg Intravenous Given 10/25/21 1328)    ED Course/ Medical Decision Making/ A&P                           Medical Decision Making Amount and/or Complexity of Data Reviewed Radiology: ordered.  Risk Prescription drug management.  This patient presents to the ED for concern of left arm weakness/numbness and abd pain, this involves an extensive  number of treatment options, and is a complaint that carries with it a high risk of complications and morbidity.  The differential diagnosis includes cva, cervical compression, electrolyte abn, infection.   Co morbidities that complicate the patient evaluation  HTN, OA, CHF, GERD, afib (on Eliquis), and mild pulmonary htn   Additional history obtained:  Additional history obtained from epic chart review External records from outside source obtained and reviewed including family member   Lab Tests:  I Ordered, and personally interpreted labs.  The pertinent results include:  cbc nl, cmp nl, tsh nl, trop nl, mg nl   Imaging Studies ordered:  I ordered imaging studies including ct head, ct c-spine, cxr, mri brain/c-spine  I independently visualized and interpreted imaging which showed  CT head/c-spine: IMPRESSION:  1. No acute intracranial abnormality. No calvarial fracture.  2. No acute fracture or listhesis of the cervical spine.  3. Advanced multilevel degenerative disc and degenerative joint  disease resulting in multilevel moderate to severe neuroforaminal  narrowing, most severe on the left at C3-4.  4. Moderate central canal stenosis at C3-4 and C6-7.  5. Asymmetric enlargement of the right thyroid gland, better  assessed on sonogram of 11/12/2019 and previously biopsied on  11/23/2019.  CXR: IMPRESSION:  No active cardiopulmonary disease.  CT abd/pelvis: IMPRESSION:  1. Moderate to high-grade stool seen throughout all segments of the  colon, especially the distal colon. This is compatible with  constipation. No bowel obstruction.  2. The gallbladder is not well visualized and may be surgically  absent. Moderate central intrahepatic biliary ductal dilatation and  dilatation of the common bile duct up to 15 mm. This may be  secondary to prior cholecystectomy postsurgical stasis, however is  indeterminate. Recommend correlation with liver enzymes.  3. There is a 12 mm  mildly heterogeneous lesion within the upper  pole of the left kidney. This may represent a complex cyst versus  solid mass (renal cell carcinoma is possible), and recommend  ultrasound for further characterization.  4. Moderate cardiomegaly.  5. High-grade atherosclerotic calcifications of the abdominal aorta  and its major branch vessels.  MRI brain: IMPRESSION:  1. No acute intracranial pathology.  2. Mild background parenchymal volume loss and moderate chronic  small vessel ischemic change.  MRI cervical spine: IMPRESSION:  1. Image quality is degraded by motion artifact, particularly  affecting the axial sequences.  2. Degenerative changes at C3-C4 result in severe spinal canal  stenosis with cord compression but no definite cord signal  abnormality, and severe left and moderate right neural foraminal  stenosis. Prominent right-sided facet joint effusion as well as  degenerative marrow edema at this level which could reflect a source  of pain.  3. Moderate to severe left and moderate right neural foraminal  stenosis at C4-C5.  4. At least moderate right and mild left neural foraminal stenosis  and mild spinal canal stenosis at C6-C7.    I agree with the radiologist interpretation   Cardiac Monitoring:  The patient was maintained on a cardiac monitor.  I personally viewed and interpreted the cardiac monitored which showed an underlying rhythm of: a fib   Medicines ordered and prescription drug management:  I ordered medication including ativan  for mri  Reevaluation of the patient after these medicines showed that the patient improved I have reviewed the patients home medicines and have made adjustments as needed   Test Considered:  Ct, mri   Critical Interventions:  mri   Problem List / ED Course:  Left arm numbness:  spinal stenosis.  I will put her on a low dose prednisone to see if that helps.  She is to f/u with NS, but I doubt she is a surgical  candidate.  No evidence of CVA on MRI. Abd pain: constipation.  Pt has meds for constipation at home.  She is to take those.   Reevaluation:  After the interventions noted above, I reevaluated the patient and found that they have :improved   Social Determinants of Health:  Lives at home   Dispostion:  After consideration of the diagnostic results and the patients response to treatment, I feel that the patent would benefit from discharge with outpatient f/u.          Final Clinical Impression(s) / ED Diagnoses Final diagnoses:  Constipation, unspecified constipation type  Cervical stenosis of spine    Rx / DC Orders ED Discharge Orders          Ordered    predniSONE (DELTASONE) 20 MG tablet  Daily        10/25/21 1521              Isla Pence, MD 10/25/21 1521

## 2021-10-25 NOTE — ED Notes (Signed)
Patient ambulates to the restroom with 1 assist without difficulty

## 2021-10-25 NOTE — ED Notes (Signed)
Pt gone to MRI 

## 2021-10-26 ENCOUNTER — Telehealth: Payer: Self-pay

## 2021-10-26 NOTE — Patient Outreach (Signed)
  Care Coordination [facility] Note Transition Care Management Unsuccessful Follow-up Telephone Call  Date of discharge and from where:  [facility] 10/25/21  Attempts:  1st Attempt  Reason for unsuccessful TCM follow-up call:  No answer/busy  Johnney Killian, RN, BSN, CCM Care Management Coordinator [facility] Phone: [phone]: [phone]

## 2021-11-05 DIAGNOSIS — M170 Bilateral primary osteoarthritis of knee: Secondary | ICD-10-CM | POA: Diagnosis not present

## 2021-11-07 DIAGNOSIS — M5412 Radiculopathy, cervical region: Secondary | ICD-10-CM | POA: Diagnosis not present

## 2021-11-10 ENCOUNTER — Encounter (HOSPITAL_COMMUNITY): Payer: Self-pay | Admitting: Emergency Medicine

## 2021-11-10 ENCOUNTER — Emergency Department (HOSPITAL_COMMUNITY)
Admission: EM | Admit: 2021-11-10 | Discharge: 2021-11-11 | Disposition: A | Discharge status: Home / Self Care | Payer: Medicare Other | Attending: Emergency Medicine | Admitting: Emergency Medicine

## 2021-11-10 ENCOUNTER — Other Ambulatory Visit: Payer: Self-pay

## 2021-11-10 DIAGNOSIS — I509 Heart failure, unspecified: Secondary | ICD-10-CM | POA: Diagnosis not present

## 2021-11-10 DIAGNOSIS — M5412 Radiculopathy, cervical region: Secondary | ICD-10-CM | POA: Diagnosis not present

## 2021-11-10 DIAGNOSIS — I110 Hypertensive heart disease with heart failure: Secondary | ICD-10-CM | POA: Diagnosis not present

## 2021-11-10 DIAGNOSIS — R200 Anesthesia of skin: Secondary | ICD-10-CM | POA: Insufficient documentation

## 2021-11-10 DIAGNOSIS — Z7901 Long term (current) use of anticoagulants: Secondary | ICD-10-CM | POA: Insufficient documentation

## 2021-11-10 DIAGNOSIS — Z79899 Other long term (current) drug therapy: Secondary | ICD-10-CM | POA: Diagnosis not present

## 2021-11-10 DIAGNOSIS — R202 Paresthesia of skin: Secondary | ICD-10-CM | POA: Diagnosis not present

## 2021-11-10 NOTE — ED Provider Triage Note (Signed)
Emergency Medicine Provider Triage Evaluation Note  Bianca [name] , a 88 y.o. female  was evaluated in triage.  Pt complains of left arm numbness. Recently evaluated for this. No change in symptoms. Did not follow up with neurosurgery.   Review of Systems  Positive: As above Negative: As above  Physical Exam  BP 116/64   Pulse 78   Temp 98.6 F (37 C)   Resp 18   SpO2 98%  Gen:   Awake, no distress   Resp:  Normal effort  MSK:   Moves extremities without difficulty  Other:  Intact strength in bilateral upper  and lower extremities. Sensation intact symmetrical.   Medical Decision Making  Medically screening exam initiated at 10:35 PM.  Appropriate orders placed.  [name] was informed that the remainder of the evaluation will be completed by another provider, this initial triage assessment does not replace that evaluation, and the importance of remaining in the ED until their evaluation is complete.     Evlyn Courier, PA-C 11/10/21 2239

## 2021-11-10 NOTE — ED Triage Notes (Signed)
Pt reported to ED with c/o numbness that radiates from left side of neck into left arm. Pt states this has been ongoing and she has been evaluated and prescribed medication for the same but has had no relief in symptoms.

## 2021-11-11 ENCOUNTER — Emergency Department (HOSPITAL_COMMUNITY): Payer: Medicare Other

## 2021-11-11 DIAGNOSIS — R200 Anesthesia of skin: Secondary | ICD-10-CM | POA: Diagnosis not present

## 2021-11-11 MED ORDER — PREDNISONE 20 MG PO TABS
60.0000 mg | ORAL_TABLET | Freq: Once | ORAL | Status: AC
  Administered 2021-11-11: 60 mg via ORAL
  Filled 2021-11-11: qty 3

## 2021-11-11 MED ORDER — PREDNISONE 10 MG PO TABS: 40.0000 mg | ORAL_TABLET | Freq: Every day | ORAL | 0 refills | Status: AC

## 2021-11-11 NOTE — ED Provider Notes (Signed)
[facility] EMERGENCY DEPARTMENT Provider Note   CSN: [id number] Arrival date & time: 11/10/21  2135     History  Chief Complaint  Patient presents with   Numbness    Bianca [name] is a 88 y.o. female.  With history of hypertension, A-fib on anticoagulation, CHF, pulmonary hypertension who presents to the ED for evaluation of left arm numbness.  Patient has been seen for this most recently on 10/24/2021 and was found to have C3 and C4 arthritis with spinal stenosis and cord compression but no obvious cord signal abnormality on MRI of cervical spine.  Neurosurgery was consulted at that time and recommended outpatient follow-up.  Per chart review, patient has not been seen by neurosurgery yet.  There is a note on 10/26/2021 from patient I will reach stating that they were unable to reach patient.  Patient does report being seen by her orthopedic specialist regarding her knees on Monday where she brought up her most recent visits.  Her orthopedic provider then started her on tizanidine and gabapentin.  She states these have slightly improved her symptoms, however they are still present.  Left arm numbness is worse with arm movements and improves with rest.  Pain had gotten worse throughout the day yesterday which prompted her to present to the ED.  Patient unfortunately had a long wait in the waiting room, and by the time of my evaluation she reports her symptoms have nearly resolved.  Denies chest pain, increasing shortness of breath, weakness, dizziness, lightheadedness, headaches.  Patient saw her orthopedic physician, Dr. Para March, after her ED visit on 10/24/2021 and brought up her visit. She reports her orthopedic physician referred her to a sports medicine physician, Dr. Ron Agee, who prescribed the gabapentin and tizanidine. She has not seen neurosurgery.  HPI     Home Medications Prior to Admission medications   Medication Sig Start Date End Date Taking?  Authorizing Provider  amLODipine-benazepril (LOTREL) 5-20 MG capsule TAKE 1 CAPSULE BY MOUTH DAILY 09/24/21   Dameron, Luna Fuse, DO  apixaban (ELIQUIS) 5 MG TABS tablet TAKE 1 TABLET BY MOUTH TWICE  DAILY Patient taking differently: Take 5 mg by mouth 2 (two) times daily. 09/24/21   Jettie Booze, MD  calcium-vitamin D (OSCAL WITH D 500-200) 500-200 MG-UNIT per tablet Take 1 tablet by mouth daily.    [provider]  carboxymethylcellulose (REFRESH PLUS) 0.5 % SOLN Place 1 drop into both eyes daily as needed (dry eyes).    [provider]  Cholecalciferol (VITAMIN D-3) 1000 UNITS CAPS Take 1,000 Units by mouth daily.     [provider]  fish oil-omega-3 fatty acids 1000 MG capsule Take 2 g by mouth daily.    [provider]  furosemide (LASIX) 40 MG tablet Take 1.5 tablets (60 mg total) by mouth daily. Patient taking differently: Take 40 mg by mouth daily. 09/24/21   Dameron, Luna Fuse, DO  hydrocortisone cream 1 % Apply 1 application topically 2 (two) times daily. Only on face Patient taking differently: Apply 1 application  topically 2 (two) times daily as needed for itching. 04/08/19   Tammi Klippel, Sherin, DO  latanoprost (XALATAN) 0.005 % ophthalmic solution Place 1 drop into the left eye at bedtime. 06/15/21   [provider]  MAPAP ARTHRITIS PAIN 650 MG CR tablet TAKE 1 TABLET BY MOUTH  EVERY 8 HOURS AS NEEDED FOR PAIN Patient taking differently: Take 650 mg by mouth every 8 (eight) hours as needed for pain. 07/31/17   Shawna Orleans,  Elsia J, DO  methocarbamol (ROBAXIN) 500 MG tablet Take 1 tablet (500 mg total) by mouth every 8 (eight) hours as needed for muscle spasms. 10/25/21   Drenda Freeze, MD  Multiple Vitamins-Minerals (MULTI FOR HER 50+) TABS Take 1 tablet by mouth daily.    [provider]  nitroGLYCERIN (NITROSTAT) 0.4 MG SL tablet DISSOLVE 1 TABLET UNDER THE TONGUE EVERY 5 MINUTES AS  NEEDED FOR CHEST PAIN. MAX  OF 3 TABLETS IN 15 MINUTES.  CALL 911 IF PAIN PERSISTS. Patient taking differently: Place 0.4 mg under the tongue every 5 (five) minutes as needed for chest pain. 05/16/21   Jettie Booze, MD  nystatin-triamcinolone [facility]) cream Apply to under breasts and on abdomen twice daily 03/18/19   Lattie Haw, MD  predniSONE (DELTASONE) 20 MG tablet Take 60 mg daily x 2 days then 40 mg daily x 2 days then 20 mg daily x 2 days 10/25/21   Drenda Freeze, MD  Specialty Vitamins Products ([employer] HAIR/SKIN/NAILS) TABS Take 1 tablet by mouth daily.    [provider]  spironolactone (ALDACTONE) 25 MG tablet Take 0.5 tablets (12.5 mg total) by mouth daily. 10/05/21   Dameron, Luna Fuse, DO      Allergies    Hydrocodone, Morphine, and Penicillins    Review of Systems   Review of Systems  Neurological:  Positive for numbness.  All other systems reviewed and are negative.   Physical Exam Updated Vital Signs BP 133/84 (BP Location: Right Arm)   Pulse 97   Temp 98.4 F (36.9 C)   Resp 16   SpO2 100%  Physical Exam Vitals and nursing note reviewed.  Constitutional:      General: She is not in acute distress.    Appearance: Normal appearance. She is well-developed. She is not ill-appearing, toxic-appearing or diaphoretic.  HENT:     Head: Normocephalic and atraumatic.  Eyes:     Conjunctiva/sclera: Conjunctivae normal.  Cardiovascular:     Rate and Rhythm: Normal rate and regular rhythm.     Pulses: Normal pulses.     Heart sounds: Normal heart sounds. No murmur heard. Pulmonary:     Effort: Pulmonary effort is normal. No respiratory distress.     Breath sounds: Normal breath sounds.  Abdominal:     Palpations: Abdomen is soft.     Tenderness: There is no abdominal tenderness.  Musculoskeletal:        General: No swelling.     Cervical back: Neck supple. No tenderness.  Skin:    General: Skin is warm and dry.     Capillary Refill: Capillary refill takes less than 2 seconds.  Neurological:      General: No focal deficit present.     Mental Status: She is alert and oriented to person, place, and time.     Comments: Left arm with full strength, sensation, and range of motion. Pulses and cap refill normal.  No slurred speech, facial asymmetry, unilateral or global weakness, pronator drift.  Psychiatric:        Mood and Affect: Mood normal.     ED Results / Procedures / Treatments   Labs (all labs ordered are listed, but only abnormal results are displayed) Labs Reviewed  BASIC METABOLIC PANEL  CBC  TROPONIN I (HIGH SENSITIVITY)    EKG None  Radiology No results found.  Procedures Procedures    Medications Ordered in ED Medications  predniSONE (DELTASONE) tablet 60 mg (has no administration in time range)  ED Course/ Medical Decision Making/ A&P                           Medical Decision Making Amount and/or Complexity of Data Reviewed Labs: ordered. Radiology: ordered.  Risk Prescription drug management.  This patient presents to the ED for concern of left arm numbness, this involves an extensive number of treatment options, and is a complaint that carries with it a high risk of complications and morbidity.  The differential diagnosis includes cervical radiculopathy, ACS, muscle strain   Co morbidities that complicate the patient evaluation  A-fib, known cervical stenosis  My initial workup includes ACS rule out, prednisone  Additional history obtained from: Nursing notes from this visit. Previous records within EMR system ED visit for same on 10/25/2021.  Patient had an extensive work-up including MRI C-spine which did show C3-C4 cervical stenosis without cord abnormality.  Referral was given for neurosurgery Family daughter and son are present and provide a portion of the history   I ordered imaging studies including chest x-ray I independently visualized and interpreted imaging which showed normal I agree with the radiologist  interpretation  Afebrile, hemodynamically stable.  Patient is a 88 year old female with a known history of cervical stenosis C3-C4 area as confirmed by MRI 18 days ago who is presenting to the ED for same complaint of left upper extremity numbness that is worsened with activity.  On my evaluation patient reported she had no symptoms because she had been resting since she got here.  Physical exam completely benign. extensive shared decision-making conversation was had and patient opted to decline ACS rule out and further imaging due to recency of images and known complication of cervical stenosis.  I did confirm the patient has not seen neurosurgery yet, and stressed the importance of seeing neurosurgery for long-term solutions to her symptoms.  I gave her another referral for neurosurgery.  Overall, patient's symptoms are reassuring for cervical radiculopathy and I initially had low suspicion for ACS or other cardiac emergencies, however did initially want to rule this out.  Patient declined this.  Prescribed a prednisone burst and again stressed the importance of following up with neurosurgery.  Stable at discharge.  At this time there does not appear to be any evidence of an acute emergency medical condition and the patient appears stable for discharge with appropriate outpatient follow up. Diagnosis was discussed with patient who verbalizes understanding of care plan and is agreeable to discharge. I have discussed return precautions with patient and son and daughter who verbalizes understanding. Patient encouraged to follow-up with their PCP within 1 week. All questions answered.  Patient's case discussed with Dr. Kathrynn Humble who agrees with plan to discharge with follow-up.   Note: Portions of this report may have been transcribed using voice recognition software. Every effort was made to ensure accuracy; however, inadvertent computerized transcription errors may still be present.          Final  Clinical Impression(s) / ED Diagnoses Final diagnoses:  None    Rx / DC Orders ED Discharge Orders     None         Roylene Reason, Hershal Coria 11/11/21 Jule Economy, MD 11/20/21 2326

## 2021-11-11 NOTE — Discharge Instructions (Addendum)
You have been seen today for your complaint of left arm numbness.. Your imaging on 10/24/2021 showed that you have a pinched nerve in your neck.. Your discharge medications include prednisone.  This is a steroid.  You should take it as prescribed.  You should take it with food and in the morning to prevent an upset stomach. Follow up with: Dr. Zada Finders. He is a Publishing rights manager. You should call to schedule an appointment for an ED follow up Please seek immediate medical care if you develop any of the following symptoms: Have loss of bladder control (urinary incontinence) or you cannot urinate. Cannot control bowel movements (fecal incontinence). At this time there does not appear to be the presence of an emergent medical condition, however there is always the potential for conditions to change. Please read and follow the below instructions.  Do not take your medicine if  develop an itchy rash, swelling in your mouth or lips, or difficulty breathing; call 911 and seek immediate emergency medical attention if this occurs.  You may review your lab tests and imaging results in their entirety on your MyChart account.  Please discuss all results of fully with your primary care provider and other specialist at your follow-up visit.  Note: Portions of this text may have been transcribed using voice recognition software. Every effort was made to ensure accuracy; however, inadvertent computerized transcription errors may still be present.

## 2021-11-12 ENCOUNTER — Telehealth: Payer: Self-pay

## 2021-11-12 NOTE — Patient Outreach (Signed)
  Care Coordination TOC Note Transition Care Management Unsuccessful Follow-up Telephone Call  Date of discharge and from where:  [facility] 11/10/21  Attempts:  1st Attempt  Reason for unsuccessful TCM follow-up call:  Unable to leave message

## 2021-11-13 ENCOUNTER — Telehealth: Payer: Self-pay

## 2021-11-13 DIAGNOSIS — Z1152 Encounter for screening for COVID-19: Secondary | ICD-10-CM | POA: Diagnosis not present

## 2021-11-13 NOTE — Patient Outreach (Signed)
  Care Coordination [facility] Note Transition Care Management Unsuccessful Follow-up Telephone Call  Date of discharge and from where:  [facility] 11/10/21  Attempts:  2nd Attempt  Reason for unsuccessful TCM follow-up call:  No answer/busy  Johnney Killian, RN, BSN, CCM Care Management Coordinator [facility] Phone: [phone]: [phone]

## 2021-11-14 ENCOUNTER — Telehealth: Payer: Self-pay

## 2021-11-14 DIAGNOSIS — M4802 Spinal stenosis, cervical region: Secondary | ICD-10-CM | POA: Diagnosis not present

## 2021-11-14 DIAGNOSIS — M6281 Muscle weakness (generalized): Secondary | ICD-10-CM | POA: Diagnosis not present

## 2021-11-14 NOTE — Patient Outreach (Signed)
  Care Coordination [facility] Note Transition Care Management Unsuccessful Follow-up Telephone Call  Date of discharge and from where:  [facility] 11/10/21  Attempts:  3rd Attempt  Reason for unsuccessful TCM follow-up call:  No answer/busy  Johnney Killian, RN, BSN, CCM Care Management Coordinator [facility] Phone: [phone]: [phone]

## 2021-11-15 ENCOUNTER — Ambulatory Visit
Admission: RE | Admit: 2021-11-15 | Discharge: 2021-11-15 | Disposition: A | Discharge status: Home / Self Care | Payer: Medicare Other | Source: Ambulatory Visit | Attending: Family Medicine | Admitting: Family Medicine

## 2021-11-15 DIAGNOSIS — Z1152 Encounter for screening for COVID-19: Secondary | ICD-10-CM | POA: Diagnosis not present

## 2021-11-15 DIAGNOSIS — Z1231 Encounter for screening mammogram for malignant neoplasm of breast: Secondary | ICD-10-CM | POA: Diagnosis not present

## 2021-11-22 DIAGNOSIS — M6281 Muscle weakness (generalized): Secondary | ICD-10-CM | POA: Diagnosis not present

## 2021-11-22 DIAGNOSIS — M4802 Spinal stenosis, cervical region: Secondary | ICD-10-CM | POA: Diagnosis not present

## 2021-11-29 DIAGNOSIS — M4802 Spinal stenosis, cervical region: Secondary | ICD-10-CM | POA: Diagnosis not present

## 2021-11-29 DIAGNOSIS — M6281 Muscle weakness (generalized): Secondary | ICD-10-CM | POA: Diagnosis not present

## 2021-12-02 DIAGNOSIS — Z1152 Encounter for screening for COVID-19: Secondary | ICD-10-CM | POA: Diagnosis not present

## 2021-12-05 DIAGNOSIS — M6281 Muscle weakness (generalized): Secondary | ICD-10-CM | POA: Diagnosis not present

## 2021-12-05 DIAGNOSIS — M4802 Spinal stenosis, cervical region: Secondary | ICD-10-CM | POA: Diagnosis not present

## 2021-12-13 ENCOUNTER — Telehealth: Payer: Self-pay | Admitting: Interventional Cardiology

## 2021-12-13 NOTE — Telephone Encounter (Signed)
  Pt c/o swelling: STAT is pt has developed SOB within 24 hours  If swelling, where is the swelling located? Right ankle   How much weight have you gained and in what time span? None, pt is losing weight   Have you gained 3 pounds in a day or 5 pounds in a week? None   Do you have a log of your daily weights (if so, list)? None   Are you currently taking a fluid pill? Yes   Are you currently SOB? No   Have you traveled recently? No   Pt's daughter said, pt has swelling in her right ankle, losing weight and no energy. She said even when pt tries to eat, she can't barely move and feeling exhausted. They made an appt with Richardson Dopp on 12/05/2 at 10:05 am

## 2021-12-13 NOTE — Telephone Encounter (Signed)
I spoke with patient who gave permission to speak with her daughter. They report patient has had low energy for last month or so.  I told them it would be fine to keep appointment with Margaret Pyle on 12/5 but I asked them to reach out to PCP to schedule appointment for evaluation of her symptoms.  Number also given for [facility] as they have been trying to reach patient.  Daughter will contact [facility].

## 2021-12-14 DIAGNOSIS — Z1152 Encounter for screening for COVID-19: Secondary | ICD-10-CM | POA: Diagnosis not present

## 2021-12-17 ENCOUNTER — Ambulatory Visit: Payer: Medicare Other | Admitting: Student

## 2021-12-18 ENCOUNTER — Ambulatory Visit: Payer: Medicare Other | Admitting: Physician Assistant

## 2021-12-19 NOTE — Progress Notes (Unsigned)
Cardiology Office Note:    Date:  12/20/2021   ID:  [name], DOB 29-Feb-1930, MRN [id number]  PCP:  Orvis Brill, DO   [facility] Providers Cardiologist:  Larae Grooms, MD     Referring MD: Orvis Brill, DO   Chief Complaint: weakness  History of Present Illness:    Bianca [name] is a pleasant 88 y.o. female with a hx of permanent atrial fibrillation on chronic anticoagulation, chronic HFpEF,HTN, Jehovah's witness (refusal of blood products), thoracic ascending aortic aneurysm, and former tobacco use.   Establish care with cardiology in 2012 or prior.  Previously took Coumadin for anticoagulation.  Lexiscan Myoview 10/2012 revealed inferior defect.  Study was not gated and it is not certain if this represented attenuation or scar.  There was no ischemia.  In 2018 she had some chest discomfort, started in her epigastric area and went up into her chest.  She drank soda and that helped. Beta-blocker previously discontinued due to bradycardia.  Thoracic aneurysm 46 mm in 2020, stable from 2017.  Deemed not a candidate for surgery, no need to continue imaging which she agrees. She has maintained consistent follow-up.  Last cardiology clinic visit was 04/09/2021 with Dr. Irish Lack.  She had no specific cardiac complaints and no changes were made to her treatment regimen. Labs were stable at that time and 1 year follow-up was recommended.  Her family contacted  our office on 12/13/21 to report patient is having low energy for past month. She was scheduled for an office visit and advised to contact PCP as well.   Today, she is here with her daughter for evaluation of weakness. Reports more leg swelling recently and it usually improves in the colder weather.  She reports chronic dyspnea on exertion is stable, no orthopnea, PND.  Reports significant weight loss by her scales, however it sounds like from her description she has lost about 70 lbs over months. By our scale,  she is down 1 lb in 2 months. Drinks an Ensure first thing in the morning. Usually does not eat another meal until the evening. Eats a couple of spoonfuls of peanut butter during the day. Generally eats baked chicken, greens for dinner. BP readings at home seem inaccurate to her because they are quite different, however she reports a recent BP similar to ours today.  She denies chest pain, palpitations, presyncope, syncope. No bleeding concerns.  We reviewed recent lab results from other providers and testing from admission 10/2021. No specific cardiac complaints. She continues to live alone and do her own self-care and house work.   Past Medical History:  Diagnosis Date   Bradycardia    a. Holter monitor in the past showed bradycardia/pauses, so she is not on nodal blockers.    Chronic diastolic heart failure ([place])    a. 12/2015 showed normal EF, mild LVH, 36mm ascending aortc aneurysm, severe LAE, mildly dilated RV with mildly reduced RVF, mild TR/PR, mildly increased PASP.   GERD (gastroesophageal reflux disease)    Hx of cardiovascular stress test    Lexiscan Myoview (10/14):  Fixed inf defect, no ischemia, study not gated   Hx of echocardiogram    Echo (11/14):  Mild LVH, EF 60-65%, mod to severe LAE, mod RVE, mild to mod reduced RVSF, mild RAE, PASP 38   Hypertension    Long term current use of anticoagulant    Mild pulmonary hypertension (HCC)    Obesity    Osteoarthritis    Permanent atrial  fibrillation ([place])    Refusal of blood transfusions as patient is Jehovah's Witness    Thoracic ascending aortic aneurysm ([place])     Past Surgical History:  Procedure Laterality Date   ABDOMINAL HYSTERECTOMY     CARPAL TUNNEL RELEASE     CHOLECYSTECTOMY     IRRIGATION AND DEBRIDEMENT SEBACEOUS CYST  1980   sebaceous cysts removed from tail bone   KNEE ARTHROSCOPY Bilateral    TUBAL LIGATION      Current Medications: Current Meds  Medication Sig   amLODipine-benazepril (LOTREL) 5-20 MG  capsule TAKE 1 CAPSULE BY MOUTH DAILY   apixaban (ELIQUIS) 5 MG TABS tablet TAKE 1 TABLET BY MOUTH TWICE  DAILY   calcium-vitamin D (OSCAL WITH D 500-200) 500-200 MG-UNIT per tablet Take 1 tablet by mouth daily.   carboxymethylcellulose (REFRESH PLUS) 0.5 % SOLN Place 1 drop into both eyes daily as needed (dry eyes).   Cholecalciferol (VITAMIN D-3) 1000 UNITS CAPS Take 1,000 Units by mouth daily.    fish oil-omega-3 fatty acids 1000 MG capsule Take 2 g by mouth daily.   furosemide (LASIX) 40 MG tablet Take 1.5 tablets (60 mg total) by mouth daily. (Patient taking differently: Take 40 mg by mouth daily.)   hydrocortisone cream 1 % Apply 1 application topically 2 (two) times daily. Only on face   latanoprost (XALATAN) 0.005 % ophthalmic solution Place 1 drop into the left eye at bedtime.   MAPAP ARTHRITIS PAIN 650 MG CR tablet TAKE 1 TABLET BY MOUTH  EVERY 8 HOURS AS NEEDED FOR PAIN   methocarbamol (ROBAXIN) 500 MG tablet Take 1 tablet (500 mg total) by mouth every 8 (eight) hours as needed for muscle spasms.   Multiple Vitamins-Minerals (MULTI FOR HER 50+) TABS Take 1 tablet by mouth daily.   nitroGLYCERIN (NITROSTAT) 0.4 MG SL tablet DISSOLVE 1 TABLET UNDER THE TONGUE EVERY 5 MINUTES AS  NEEDED FOR CHEST PAIN. MAX  OF 3 TABLETS IN 15 MINUTES. CALL 911 IF PAIN PERSISTS.   nystatin-triamcinolone (MYCOLOG II) cream Apply to under breasts and on abdomen twice daily   Specialty Vitamins Products ([employer] HAIR/SKIN/NAILS) TABS Take 1 tablet by mouth daily.   spironolactone (ALDACTONE) 25 MG tablet Take 0.5 tablets (12.5 mg total) by mouth daily.     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 She has 11 children. One of her sons passed away as an infant, and one of her daughters was killed by a boyfriend at 34 years of age. She reports  that her remaining children have a lot of health problems and this makes her very sad.       She is a Sales promotion account executive witness.       Current Social History       Who lives at home: Patient lives alone in one level home    Transportation: Patient is driven by her niece and  granddaughter. Currently applying for medicare transportation.    Pets: None    Education / Work:  73 th grade/ Retired    Financial trader / Fun: Family events, picnics, out to eat   Religious / Personal Beliefs: Jehovah's Witness                                                                                                       Social Determinants of Health   Financial Resource Strain: Low Risk  (03/06/2021)   Overall Financial Resource Strain (CARDIA)    Difficulty of Paying Living Expenses: Not hard at all  Food Insecurity: No Food Insecurity (03/06/2021)   Hunger Vital Sign    Worried About Running Out of Food in the Last Year: Never true    [place] in the Last Year: Never true  Transportation Needs: No Transportation Needs (03/06/2021)   PRAPARE - Hydrologist (Medical): No    Lack of Transportation (Non-Medical): No  Physical Activity: Inactive (03/06/2021)   Exercise Vital Sign    Days of Exercise per Week: 0 days    Minutes of Exercise per Session: 0 min  Stress: No  Stress Concern Present (03/06/2021)   [place]    Feeling of Stress : Only a little  Social Connections: Moderately Isolated (03/06/2021)   Social Connection and Isolation Panel [NHANES]    Frequency of Communication with Friends and Family: More than three times a week    Frequency of Social Gatherings with Friends and Family: More than three times a week    Attends Religious Services: More than 4 times per year    Active Member of [employer] or Organizations: No    Attends Archivist Meetings: Never    Marital Status: Widowed     Family History: The patient's family history includes Asthma in her mother; Breast cancer in her maternal grandmother; Breast cancer (age of onset: 29) in her daughter; Cancer in her daughter, mother, and son; Clotting disorder in her daughter; Colon cancer in her mother; Depression in her daughter, daughter, and daughter; Diabetes in her brother, sister, and another family member; Hypertension in an other family member; Kidney cancer in her son. There is no history of Heart attack.  ROS:   Please see the history of present illness.    + generalized weakness and fatigue All other systems reviewed and are negative.  Labs/Other Studies Reviewed:    The following studies were reviewed today:  Echo 01/05/16 Left ventricle: The cavity size was normal. There was mild    concentric hypertrophy. Systolic function was normal. Wall motion    was  normal; there were no regional wall motion abnormalities.  - Aortic valve: There was no regurgitation.  - Ascending aorta: The ascending aorta was moderately dilated    measuring 47 mm.  - Mitral valve: Mildly thickened leaflets . There was mild    regurgitation.  - Left atrium: The atrium was severely dilated.  - Right ventricle: The cavity size was mildly dilated. Wall    thickness was normal. Systolic function was mildly reduced.  - Right atrium:  The atrium was moderately dilated.  - Tricuspid valve: There was mild regurgitation.  - Pulmonic valve: There was mild regurgitation.  - Pulmonary arteries: Systolic pressure was mildly increased. PA    peak pressure: 38 mm Hg (S).  - Inferior vena cava: The vessel was normal in size.  Recent Labs: 10/24/2021: ALT 16; BUN 16; Creatinine, Ser 0.68; Hemoglobin 12.5; Magnesium 2.2; Platelets 199; Potassium 4.1; Sodium 138; TSH 0.771  Recent Lipid Panel    Component Value Date/Time   CHOL 137 10/27/2019 1048   TRIG 66 10/27/2019 1048   HDL 45 10/27/2019 1048   CHOLHDL 3.0 10/27/2019 1048   CHOLHDL 3.6 12/21/2014 1523   VLDL 23 12/21/2014 1523   LDLCALC 78 10/27/2019 1048   LDLDIRECT 119 (H) 06/14/2009 1819     Risk Assessment/Calculations:    CHA2DS2-VASc Score = 5  This indicates a 7.2% annual risk of stroke. The patient's score is based upon: CHF History: 1 HTN History: 1 Diabetes History: 0 Stroke History: 0 Vascular Disease History: 0 Age Score: 2 Gender Score: 1    Physical Exam:    VS:  BP 130/66   Pulse 78   Ht '5\' 6"'$  (1.676 m)   Wt 174 lb 9.6 oz (79.2 kg)   SpO2 98%   BMI 28.18 kg/m     Wt Readings from Last 3 Encounters:  12/20/21 174 lb 9.6 oz (79.2 kg)  10/24/21 175 lb 0.7 oz (79.4 kg)  10/05/21 175 lb (79.4 kg)     GEN:  Frail in no acute distress HEENT: Normal NECK: No JVD; No carotid bruits CARDIAC: RRR, no murmurs, rubs, gallops RESPIRATORY:  Clear to auscultation without rales, wheezing or rhonchi  ABDOMEN: Soft, non-tender, non-distended MUSCULOSKELETAL:  Bilateral LE edema; No deformity. 2+ pedal pulses, equal bilaterally SKIN: Warm and dry NEUROLOGIC:  Alert and oriented x 3 PSYCHIATRIC:  Normal affect   EKG:  EKG is not ordered today   Diagnoses:    1. Generalized weakness   2. Chronic diastolic heart failure ([place])   3. Fatigue, unspecified type   4. Permanent atrial fibrillation ([place])   5. Aneurysm of ascending aorta without  rupture ([place])   6. Long term (current) use of anticoagulants   7. Hypertensive heart disease with chronic diastolic congestive heart failure (HCC)    Assessment and Plan:     Generalized weakness and fatigue: She reports recent increase in weakness and fatigue. Appetite is decreased, goes hours without eating a meal. Recent lab testing normal. No bleeding concerns, hydrates well. Likely multifactorial in setting of deconditioning, age, chronic HFpEF and a fib. Encouraged improved diet and nutrition. We will check echo to evaluate for worsening heart or valve function that may be contributing.   Permanent atrial fibrillation on chronic anticoagulation: HR is well-controlled. Not on any AV nodal blockers due to past hx of bradycardia. No bleeding problems on Eliquis. Continue Eliquis 5 mg twice daily which is appropriate for age/weight/creatinine.  Hypertensive heart disease with chronic HFpEF: Bilateral LE edema  that she reports is chronic, but usually improved in cold weather. Otherwise appears euvolemic on exam.  Chronic stable dyspnea. No orthopnea, PND, chest pain, palpitations, presyncope or syncope. No change in activity tolerance, does her own housekeeping and cooking.  We will get echocardiogram to evaluate for worsening heart or valve function. She is agreeable to consider changes in medical therapy if indicated on echo.  Advised her to take additional Lasix 20 mg for worsening SOB, edema or weight gain. Continue spironolactone, Lasix, Lotrel.  Thoracic aortic aneurysm: Ascending aortic aneurysm measuring 4.6 cm on MRA chest 10/2020, not changed in comparison to previous studies. Per Dr. Hassell Done notes, previous agreement to discontinue monitoring due to stability and not a candidate for open heart surgery. She again verbalized agreement with this plan today.  Hypertension: BP initially elevated, improved upon my recheck. No medication changes today.      Disposition: 3-4 months with Dr.  Irish Lack or APP  Medication Adjustments/Labs and Tests Ordered: Current medicines are reviewed at length with the patient today.  Concerns regarding medicines are outlined above.  No orders of the defined types were placed in this encounter.  No orders of the defined types were placed in this encounter.   Patient Instructions  Medication Instructions:   TAKE extra lasix (40 mg ) by mouth if weight gain of 3 lbs over night or 5 lbs in one week.   *If you need a refill on your cardiac medications before your next appointment, please call your pharmacy*   Lab Work:  None ordered.  If you have labs (blood work) drawn today and your tests are completely normal, you will receive your results only by: [place] (if you have MyChart) OR A paper copy in the mail If you have any lab test that is abnormal or we need to change your treatment, we will call you to review the results.   Testing/Procedures:  Your physician has requested that you have an echocardiogram. Echocardiography is a painless test that uses sound waves to create images of your heart. It provides your doctor with information about the size and shape of your heart and how well your heart's chambers and valves are working. This procedure takes approximately one hour. There are no restrictions for this procedure. Please do NOT wear perfume, or lotions (deodorant is allowed). Please arrive 15 minutes prior to your appointment time.   Follow-Up: At [facility], you and your health needs are our priority.  As part of our continuing mission to provide you with exceptional heart care, we have created designated Provider Care Teams.  These Care Teams include your primary Cardiologist (physician) and Advanced Practice Providers (APPs -  Physician Assistants and Nurse Practitioners) who all work together to provide you with the care you need, when you need it.  We recommend signing up for the patient portal called  "MyChart".  Sign up information is provided on this After Visit Summary.  MyChart is used to connect with patients for Virtual Visits (Telemedicine).  Patients are able to view lab/test results, encounter notes, upcoming appointments, etc.  Non-urgent messages can be sent to your provider as well.   To learn more about what you can do with MyChart, go to [email or web address].    Your next appointment:   4 month(s)  The format for your next appointment:   In Person  Provider:   Larae Grooms, MD     Important Information About Sugar  Signed, Emmaline Life, NP  12/20/2021 12:03 PM    [place]

## 2021-12-20 ENCOUNTER — Encounter: Payer: Self-pay | Admitting: Nurse Practitioner

## 2021-12-20 ENCOUNTER — Ambulatory Visit: Payer: Medicare Other | Attending: Physician Assistant | Admitting: Nurse Practitioner

## 2021-12-20 VITALS — BP 130/66 | HR 78 | Ht 66.0 in | Wt 174.6 lb

## 2021-12-20 DIAGNOSIS — R531 Weakness: Secondary | ICD-10-CM

## 2021-12-20 DIAGNOSIS — I5032 Chronic diastolic (congestive) heart failure: Secondary | ICD-10-CM

## 2021-12-20 DIAGNOSIS — I7121 Aneurysm of the ascending aorta, without rupture: Secondary | ICD-10-CM | POA: Diagnosis not present

## 2021-12-20 DIAGNOSIS — I4821 Permanent atrial fibrillation: Secondary | ICD-10-CM | POA: Diagnosis not present

## 2021-12-20 DIAGNOSIS — I110 Hypertensive heart disease with heart failure: Secondary | ICD-10-CM

## 2021-12-20 DIAGNOSIS — Z7901 Long term (current) use of anticoagulants: Secondary | ICD-10-CM

## 2021-12-20 DIAGNOSIS — R5383 Other fatigue: Secondary | ICD-10-CM

## 2021-12-20 NOTE — Patient Instructions (Signed)
Medication Instructions:   TAKE extra lasix (40 mg ) by mouth if weight gain of 3 lbs over night or 5 lbs in one week.   *If you need a refill on your cardiac medications before your next appointment, please call your pharmacy*   Lab Work:  None ordered.  If you have labs (blood work) drawn today and your tests are completely normal, you will receive your results only by: [place] (if you have MyChart) OR A paper copy in the mail If you have any lab test that is abnormal or we need to change your treatment, we will call you to review the results.   Testing/Procedures:  Your physician has requested that you have an echocardiogram. Echocardiography is a painless test that uses sound waves to create images of your heart. It provides your doctor with information about the size and shape of your heart and how well your heart's chambers and valves are working. This procedure takes approximately one hour. There are no restrictions for this procedure. Please do NOT wear perfume, or lotions (deodorant is allowed). Please arrive 15 minutes prior to your appointment time.   Follow-Up: At [facility], you and your health needs are our priority.  As part of our continuing mission to provide you with exceptional heart care, we have created designated Provider Care Teams.  These Care Teams include your primary Cardiologist (physician) and Advanced Practice Providers (APPs -  Physician Assistants and Nurse Practitioners) who all work together to provide you with the care you need, when you need it.  We recommend signing up for the patient portal called "MyChart".  Sign up information is provided on this After Visit Summary.  MyChart is used to connect with patients for Virtual Visits (Telemedicine).  Patients are able to view lab/test results, encounter notes, upcoming appointments, etc.  Non-urgent messages can be sent to your provider as well.   To learn more about what you can do with  MyChart, go to [email or web address].    Your next appointment:   4 month(s)  The format for your next appointment:   In Person  Provider:   Larae Grooms, MD     Important Information About Sugar

## 2022-01-01 ENCOUNTER — Ambulatory Visit (INDEPENDENT_AMBULATORY_CARE_PROVIDER_SITE_OTHER): Payer: Medicare Other | Admitting: Internal Medicine

## 2022-01-01 ENCOUNTER — Encounter: Payer: Self-pay | Admitting: Internal Medicine

## 2022-01-01 VITALS — BP 148/82 | HR 91 | Temp 97.9°F | Ht 66.0 in | Wt 174.4 lb

## 2022-01-01 DIAGNOSIS — Z0001 Encounter for general adult medical examination with abnormal findings: Secondary | ICD-10-CM

## 2022-01-01 DIAGNOSIS — Z0000 Encounter for general adult medical examination without abnormal findings: Secondary | ICD-10-CM | POA: Diagnosis not present

## 2022-01-01 DIAGNOSIS — I4819 Other persistent atrial fibrillation: Secondary | ICD-10-CM

## 2022-01-01 DIAGNOSIS — I5032 Chronic diastolic (congestive) heart failure: Secondary | ICD-10-CM | POA: Diagnosis not present

## 2022-01-01 DIAGNOSIS — I110 Hypertensive heart disease with heart failure: Secondary | ICD-10-CM

## 2022-01-01 DIAGNOSIS — I7121 Aneurysm of the ascending aorta, without rupture: Secondary | ICD-10-CM

## 2022-01-01 NOTE — Patient Instructions (Signed)

## 2022-01-01 NOTE — Progress Notes (Unsigned)
Subjective:  Patient ID: Bianca [name], female    DOB: 14-May-1930  Age: 88 y.o. MRN: [id number]  CC: Annual Exam, Hypertension, Osteoarthritis, Congestive Heart Failure, and Atrial Fibrillation   HPI Bianca [name] presents for a CPX and to establish.  She has a positive but unchanged ROS.  History Echo has a past medical history of Bradycardia, Chronic diastolic heart failure ([place]), GERD (gastroesophageal reflux disease), cardiovascular stress test, echocardiogram, Hypertension, Long term current use of anticoagulant, Mild pulmonary hypertension ([place]), Obesity, Osteoarthritis, Permanent atrial fibrillation ([place]), Refusal of blood transfusions as patient is Jehovah's Witness, and Thoracic ascending aortic aneurysm ([place]).   She has a past surgical history that includes Cholecystectomy; Abdominal hysterectomy; Tubal ligation; Knee arthroscopy (Bilateral); Carpal tunnel release; and Irrigation and debridement sabaceous cyst (1980).   Her family history includes Asthma in her mother; Breast cancer in her maternal grandmother; Breast cancer (age of onset: 17) in her daughter; Cancer in her daughter, mother, and son; Clotting disorder in her daughter; Colon cancer in her mother; Depression in her daughter, daughter, and daughter; Diabetes in her brother, sister, and another family member; Hypertension in an other family member; Kidney cancer in her son.She reports that she quit smoking about 53 years ago. Her smoking use included cigarettes. She has a 5.00 pack-year smoking history. She has been exposed to tobacco smoke. She has never used smokeless tobacco. She reports that she does not drink alcohol and does not use drugs.  Outpatient Medications Prior to Visit  Medication Sig Dispense Refill   amLODipine-benazepril (LOTREL) 5-20 MG capsule TAKE 1 CAPSULE BY MOUTH DAILY 100 capsule 2   apixaban (ELIQUIS) 5 MG TABS tablet TAKE 1 TABLET BY MOUTH TWICE  DAILY 200 tablet 1   calcium-vitamin  D (OSCAL WITH D 500-200) 500-200 MG-UNIT per tablet Take 1 tablet by mouth daily.     carboxymethylcellulose (REFRESH PLUS) 0.5 % SOLN Place 1 drop into both eyes daily as needed (dry eyes).     Cholecalciferol (VITAMIN D-3) 1000 UNITS CAPS Take 1,000 Units by mouth daily.      fish oil-omega-3 fatty acids 1000 MG capsule Take 2 g by mouth daily.     furosemide (LASIX) 40 MG tablet Take 1.5 tablets (60 mg total) by mouth daily. (Patient taking differently: Take 40 mg by mouth daily.) 135 tablet 3   gabapentin (NEURONTIN) 100 MG capsule Take 100 mg by mouth 3 (three) times daily.     hydrocortisone cream 1 % Apply 1 application topically 2 (two) times daily. Only on face 30 g 0   latanoprost (XALATAN) 0.005 % ophthalmic solution Place 1 drop into the left eye at bedtime.     MAPAP ARTHRITIS PAIN 650 MG CR tablet TAKE 1 TABLET BY MOUTH  EVERY 8 HOURS AS NEEDED FOR PAIN 120 tablet 0   methocarbamol (ROBAXIN) 500 MG tablet Take 1 tablet (500 mg total) by mouth every 8 (eight) hours as needed for muscle spasms. 10 tablet 0   Multiple Vitamins-Minerals (MULTI FOR HER 50+) TABS Take 1 tablet by mouth daily.     nitroGLYCERIN (NITROSTAT) 0.4 MG SL tablet DISSOLVE 1 TABLET UNDER THE TONGUE EVERY 5 MINUTES AS  NEEDED FOR CHEST PAIN. MAX  OF 3 TABLETS IN 15 MINUTES. CALL 911 IF PAIN PERSISTS. 25 tablet 3   nystatin-triamcinolone (MYCOLOG II) cream Apply to under breasts and on abdomen twice daily 30 g 0   Specialty Vitamins Products ([employer] HAIR/SKIN/NAILS) TABS Take 1 tablet by mouth daily.  spironolactone (ALDACTONE) 25 MG tablet Take 0.5 tablets (12.5 mg total) by mouth daily. 45 tablet 3   No facility-administered medications prior to visit.    ROS Review of Systems  Constitutional:  Positive for fatigue. Negative for appetite change, chills, diaphoresis, fever and unexpected weight change.  HENT: Negative.    Eyes: Negative.   Respiratory:  Positive for shortness of breath. Negative for cough,  chest tightness, wheezing and stridor.   Cardiovascular:  Negative for chest pain, palpitations and leg swelling.  Gastrointestinal:  Negative for abdominal pain, diarrhea, nausea and vomiting.  Endocrine: Negative.   Genitourinary: Negative.   Musculoskeletal:  Negative for arthralgias and myalgias.  Skin: Negative.   Neurological:  Positive for weakness and numbness. Negative for dizziness and light-headedness.  Hematological:  Negative for adenopathy. Does not bruise/bleed easily.  Psychiatric/Behavioral: Negative.      Objective:  BP (!) 148/82 (BP Location: Right Arm, Patient Position: Sitting, Cuff Size: Large)   Pulse 91   Temp 97.9 F (36.6 C) (Oral)   Ht '5\' 6"'$  (1.676 m)   Wt 174 lb 6.4 oz (79.1 kg)   SpO2 99%   BMI 28.15 kg/m   Physical Exam Vitals reviewed.  HENT:     Nose: Nose normal.     Mouth/Throat:     Mouth: Mucous membranes are moist.  Eyes:     General: No scleral icterus.    Conjunctiva/sclera: Conjunctivae normal.  Cardiovascular:     Rate and Rhythm: Normal rate. Rhythm irregularly irregular.     Heart sounds: No murmur heard.    No gallop.  Pulmonary:     Effort: Pulmonary effort is normal.     Breath sounds: No stridor. No wheezing, rhonchi or rales.  Abdominal:     General: Abdomen is flat.     Palpations: There is no mass.     Tenderness: There is no abdominal tenderness. There is no guarding.     Hernia: No hernia is present.  Musculoskeletal:        General: Normal range of motion.     Cervical back: Neck supple. No tenderness.     Right lower leg: No edema.     Left lower leg: No edema.  Lymphadenopathy:     Cervical: No cervical adenopathy.  Skin:    General: Skin is warm and dry.  Neurological:     General: No focal deficit present.     Mental Status: She is alert.  Psychiatric:        Mood and Affect: Mood normal.        Behavior: Behavior normal.     Lab Results  Component Value Date   WBC 5.1 10/24/2021   HGB 12.5  10/24/2021   HCT 39.7 10/24/2021   PLT 199 10/24/2021   GLUCOSE 95 10/24/2021   CHOL 137 10/27/2019   TRIG 66 10/27/2019   HDL 45 10/27/2019   LDLDIRECT 119 (H) 06/14/2009   LDLCALC 78 10/27/2019   ALT 16 10/24/2021   AST 26 10/24/2021   NA 138 10/24/2021   K 4.1 10/24/2021   CL 105 10/24/2021   CREATININE 0.68 10/24/2021   BUN 16 10/24/2021   CO2 26 10/24/2021   TSH 0.771 10/24/2021   INR 2.8 (H) 01/19/2013   HGBA1C 5.5 10/27/2019     Assessment & Plan:   Teira was seen today for annual exam, hypertension, osteoarthritis, congestive heart failure and atrial fibrillation.  Diagnoses and all orders for this visit:  Persistent atrial  fibrillation ([place])- She has good rate control. Will continue the DOAC.  Chronic diastolic heart failure ([place])- She has normal volume status.  Hypertensive heart disease with chronic diastolic congestive heart failure ([place])- Her BP is adequately well controlled.  Encounter for general adult medical examination with abnormal findings-  Exam completed, labs reviewed, vaccines are UTD, no cancer screenings indicated, pt ed material was given.   I am having Wynona Neat. Berline Lopes maintain her calcium-vitamin D, fish oil-omega-3 fatty acids, Vitamin D-3, Mapap Arthritis Pain, nystatin-triamcinolone, hydrocortisone cream, nitroGLYCERIN, Eliquis, amLODipine-benazepril, furosemide, spironolactone, CVS Hair/Skin/Nails, Multi For Her 50+, latanoprost, carboxymethylcellulose, methocarbamol, and gabapentin.  No orders of the defined types were placed in this encounter.    Follow-up: Return in about 6 months (around 07/03/2022).  Scarlette Calico, MD

## 2022-01-10 DIAGNOSIS — M4802 Spinal stenosis, cervical region: Secondary | ICD-10-CM | POA: Diagnosis not present

## 2022-01-10 DIAGNOSIS — M5412 Radiculopathy, cervical region: Secondary | ICD-10-CM | POA: Diagnosis not present

## 2022-01-15 ENCOUNTER — Other Ambulatory Visit (HOSPITAL_COMMUNITY): Payer: Medicare Other

## 2022-01-15 DIAGNOSIS — H401131 Primary open-angle glaucoma, bilateral, mild stage: Secondary | ICD-10-CM | POA: Diagnosis not present

## 2022-02-11 ENCOUNTER — Other Ambulatory Visit (HOSPITAL_COMMUNITY): Payer: Self-pay | Admitting: Nurse Practitioner

## 2022-02-11 ENCOUNTER — Ambulatory Visit (HOSPITAL_COMMUNITY): Payer: 59 | Attending: Cardiovascular Disease

## 2022-02-11 DIAGNOSIS — I5022 Chronic systolic (congestive) heart failure: Secondary | ICD-10-CM | POA: Insufficient documentation

## 2022-02-11 DIAGNOSIS — I5032 Chronic diastolic (congestive) heart failure: Secondary | ICD-10-CM | POA: Diagnosis not present

## 2022-02-11 LAB — ECHOCARDIOGRAM COMPLETE
Area-P 1/2: 3.56 cm2
S' Lateral: 3 cm

## 2022-02-15 ENCOUNTER — Ambulatory Visit (INDEPENDENT_AMBULATORY_CARE_PROVIDER_SITE_OTHER): Payer: 59

## 2022-02-15 ENCOUNTER — Other Ambulatory Visit: Payer: Self-pay | Admitting: Internal Medicine

## 2022-02-15 ENCOUNTER — Ambulatory Visit (INDEPENDENT_AMBULATORY_CARE_PROVIDER_SITE_OTHER): Payer: 59 | Admitting: Internal Medicine

## 2022-02-15 VITALS — BP 128/78 | HR 95 | Temp 97.5°F | Ht 66.0 in | Wt 167.0 lb

## 2022-02-15 DIAGNOSIS — I110 Hypertensive heart disease with heart failure: Secondary | ICD-10-CM

## 2022-02-15 DIAGNOSIS — R1900 Intra-abdominal and pelvic swelling, mass and lump, unspecified site: Secondary | ICD-10-CM | POA: Diagnosis not present

## 2022-02-15 DIAGNOSIS — R634 Abnormal weight loss: Secondary | ICD-10-CM

## 2022-02-15 DIAGNOSIS — R531 Weakness: Secondary | ICD-10-CM | POA: Diagnosis not present

## 2022-02-15 DIAGNOSIS — R7989 Other specified abnormal findings of blood chemistry: Secondary | ICD-10-CM

## 2022-02-15 DIAGNOSIS — I5032 Chronic diastolic (congestive) heart failure: Secondary | ICD-10-CM | POA: Diagnosis not present

## 2022-02-15 DIAGNOSIS — N2889 Other specified disorders of kidney and ureter: Secondary | ICD-10-CM

## 2022-02-15 DIAGNOSIS — Z9181 History of falling: Secondary | ICD-10-CM

## 2022-02-15 DIAGNOSIS — R739 Hyperglycemia, unspecified: Secondary | ICD-10-CM

## 2022-02-15 LAB — HEPATIC FUNCTION PANEL
ALT: 24 U/L (ref 0–35)
AST: 31 U/L (ref 0–37)
Albumin: 4.2 g/dL (ref 3.5–5.2)
Alkaline Phosphatase: 61 U/L (ref 39–117)
Bilirubin, Direct: 0.2 mg/dL (ref 0.0–0.3)
Total Bilirubin: 0.9 mg/dL (ref 0.2–1.2)
Total Protein: 7.2 g/dL (ref 6.0–8.3)

## 2022-02-15 LAB — CBC WITH DIFFERENTIAL/PLATELET
Basophils Absolute: 0 K/uL (ref 0.0–0.1)
Basophils Relative: 0.7 % (ref 0.0–3.0)
Eosinophils Absolute: 0 K/uL (ref 0.0–0.7)
Eosinophils Relative: 0.5 % (ref 0.0–5.0)
HCT: 42.1 % (ref 36.0–46.0)
Hemoglobin: 13.5 g/dL (ref 12.0–15.0)
Lymphocytes Relative: 40 % (ref 12.0–46.0)
Lymphs Abs: 2.1 K/uL (ref 0.7–4.0)
MCHC: 32 g/dL (ref 30.0–36.0)
MCV: 93 fl (ref 78.0–100.0)
Monocytes Absolute: 0.4 K/uL (ref 0.1–1.0)
Monocytes Relative: 8.4 % (ref 3.0–12.0)
Neutro Abs: 2.6 K/uL (ref 1.4–7.7)
Neutrophils Relative %: 50.4 % (ref 43.0–77.0)
Platelets: 204 K/uL (ref 150.0–400.0)
RBC: 4.52 Mil/uL (ref 3.87–5.11)
RDW: 13.6 % (ref 11.5–15.5)
WBC: 5.1 K/uL (ref 4.0–10.5)

## 2022-02-15 LAB — BASIC METABOLIC PANEL
BUN: 19 mg/dL (ref 6–23)
CO2: 30 mEq/L (ref 19–32)
Calcium: 10.2 mg/dL (ref 8.4–10.5)
Chloride: 104 mEq/L (ref 96–112)
Creatinine, Ser: 0.72 mg/dL (ref 0.40–1.20)
GFR: 73.91 mL/min (ref >60.00)
Glucose, Bld: 101 mg/dL — ABNORMAL HIGH (ref 70–99)
Potassium: 4.1 mEq/L (ref 3.5–5.1)
Sodium: 141 mEq/L (ref 135–145)

## 2022-02-15 LAB — URINALYSIS, ROUTINE W REFLEX MICROSCOPIC
Bilirubin Urine: NEGATIVE
Hgb urine dipstick: NEGATIVE
Ketones, ur: 15 — AB
Nitrite: NEGATIVE
Renal Epithel, UA: NONE SEEN
Specific Gravity, Urine: >=1.03 — AB (ref 1.000–1.030)
Urine Glucose: NEGATIVE
Urobilinogen, UA: 1 (ref 0.0–1.0)
pH: 5 (ref 5.0–8.0)

## 2022-02-15 LAB — LIPID PANEL
Cholesterol: 168 mg/dL (ref 0–200)
HDL: 49.5 mg/dL
LDL Cholesterol: 102 mg/dL — ABNORMAL HIGH (ref 0–99)
NonHDL: 118.32
Total CHOL/HDL Ratio: 3
Triglycerides: 82 mg/dL (ref 0.0–149.0)
VLDL: 16.4 mg/dL (ref 0.0–40.0)

## 2022-02-15 LAB — TSH: TSH: 0.22 u[IU]/mL — ABNORMAL LOW (ref 0.35–5.50)

## 2022-02-15 LAB — HEMOGLOBIN A1C: Hgb A1c MFr Bld: 5.2 % (ref 4.6–6.5)

## 2022-02-15 NOTE — Progress Notes (Unsigned)
Patient ID: Bianca [name], female   DOB: Apr 20, 1930, 89 y.o.   MRN: [id number]        Chief Complaint: follow up loss of appetite       HPI:  Bianca [name] is a 89 y.o. female here with c/o        Wt Readings from Last 3 Encounters:  02/15/22 167 lb (75.8 kg)  01/01/22 174 lb 6.4 oz (79.1 kg)  12/20/21 174 lb 9.6 oz (79.2 kg)   BP Readings from Last 3 Encounters:  02/15/22 128/78  01/01/22 (!) 148/82  12/20/21 130/66         Past Medical History:  Diagnosis Date   Bradycardia    a. Holter monitor in the past showed bradycardia/pauses, so she is not on nodal blockers.    Chronic diastolic heart failure ([place])    a. 12/2015 showed normal EF, mild LVH, 46mm ascending aortc aneurysm, severe LAE, mildly dilated RV with mildly reduced RVF, mild TR/PR, mildly increased PASP.   GERD (gastroesophageal reflux disease)    Hx of cardiovascular stress test    Lexiscan Myoview (10/14):  Fixed inf defect, no ischemia, study not gated   Hx of echocardiogram    Echo (11/14):  Mild LVH, EF 60-65%, mod to severe LAE, mod RVE, mild to mod reduced RVSF, mild RAE, PASP 38   Hypertension    Long term current use of anticoagulant    Mild pulmonary hypertension ([facility])    Obesity    Osteoarthritis    Permanent atrial fibrillation ([facility])    Refusal of blood transfusions as patient is Jehovah's Witness    Thoracic ascending aortic aneurysm ([facility])    Past Surgical History:  Procedure Laterality Date   ABDOMINAL HYSTERECTOMY     CARPAL TUNNEL RELEASE     CHOLECYSTECTOMY     IRRIGATION AND DEBRIDEMENT SEBACEOUS CYST  1980   sebaceous cysts removed from tail bone   KNEE ARTHROSCOPY Bilateral    TUBAL LIGATION      reports that she quit smoking about 53 years ago. Her smoking use included cigarettes. She has a 5.00 pack-year smoking history. She has been exposed to tobacco smoke. She has never used smokeless tobacco. She reports that she does not drink alcohol and does not use drugs. family  history includes Asthma in her mother; Breast cancer in her maternal grandmother; Breast cancer (age of onset: 421 in her daughter; Cancer in her daughter, mother, and son; Clotting disorder in her daughter; Colon cancer in her mother; Depression in her daughter, daughter, and daughter; Diabetes in her brother, sister, and another family member; Hypertension in an other family member; Kidney cancer in her son. Allergies  Allergen Reactions   Hydrocodone Nausea And Vomiting   Morphine Itching   Penicillins Itching   Current Outpatient Medications on File Prior to Visit  Medication Sig Dispense Refill   amLODipine-benazepril (LOTREL) 5-20 MG capsule TAKE 1 CAPSULE BY MOUTH DAILY 100 capsule 2   apixaban (ELIQUIS) 5 MG TABS tablet TAKE 1 TABLET BY MOUTH TWICE  DAILY 200 tablet 1   calcium-vitamin D (OSCAL WITH D 500-200) 500-200 MG-UNIT per tablet Take 1 tablet by mouth daily.     carboxymethylcellulose (REFRESH PLUS) 0.5 % SOLN Place 1 drop into both eyes daily as needed (dry eyes).     Cholecalciferol (VITAMIN D-3) 1000 UNITS CAPS Take 1,000 Units by mouth daily.      fish oil-omega-3 fatty acids 1000 MG capsule Take 2 g by mouth  daily.     furosemide (LASIX) 40 MG tablet Take 1.5 tablets (60 mg total) by mouth daily. (Patient taking differently: Take 40 mg by mouth daily.) 135 tablet 3   gabapentin (NEURONTIN) 100 MG capsule Take 100 mg by mouth 3 (three) times daily.     hydrocortisone cream 1 % Apply 1 application topically 2 (two) times daily. Only on face 30 g 0   latanoprost (XALATAN) 0.005 % ophthalmic solution Place 1 drop into the left eye at bedtime.     MAPAP ARTHRITIS PAIN 650 MG CR tablet TAKE 1 TABLET BY MOUTH  EVERY 8 HOURS AS NEEDED FOR PAIN 120 tablet 0   methocarbamol (ROBAXIN) 500 MG tablet Take 1 tablet (500 mg total) by mouth every 8 (eight) hours as needed for muscle spasms. 10 tablet 0   Multiple Vitamins-Minerals (MULTI FOR HER 50+) TABS Take 1 tablet by mouth daily.      nitroGLYCERIN (NITROSTAT) 0.4 MG SL tablet DISSOLVE 1 TABLET UNDER THE TONGUE EVERY 5 MINUTES AS  NEEDED FOR CHEST PAIN. MAX  OF 3 TABLETS IN 15 MINUTES. CALL 911 IF PAIN PERSISTS. 25 tablet 3   nystatin-triamcinolone (MYCOLOG II) cream Apply to under breasts and on abdomen twice daily 30 g 0   Specialty Vitamins Products ([employer]S HAIR/SKIN/NAILS) TABS Take 1 tablet by mouth daily.     spironolactone (ALDACTONE) 25 MG tablet Take 0.5 tablets (12.5 mg total) by mouth daily. 45 tablet 3   No current facility-administered medications on file prior to visit.        ROS:  All others reviewed and negative.  Objective        PE:  BP 128/78 (BP Location: Right Arm, Patient Position: Sitting, Cuff Size: Large)   Pulse 95   Temp (!) 97.5 F (36.4 C) (Oral)   Ht '5\' 6"'$  (1.676 m)   Wt 167 lb (75.8 kg)   SpO2 98%   BMI 26.95 kg/m                 Constitutional: Pt appears in NAD               HENT: Head: NCAT.                Right Ear: External ear normal.                 Left Ear: External ear normal.                Eyes: . Pupils are equal, round, and reactive to light. Conjunctivae and EOM are normal               Nose: without d/c or deformity               Neck: Neck supple. Gross normal ROM               Cardiovascular: Normal rate and regular rhythm.                 Pulmonary/Chest: Effort normal and breath sounds without rales or wheezing.                Abd:  Soft, NT, ND, + BS, no organomegaly               Neurological: Pt is alert. At baseline orientation, motor grossly intact               Skin: Skin is warm. No rashes, no other new lesions,  LE edema - ***               Psychiatric: Pt behavior is normal without agitation   Micro: none  Cardiac tracings I have personally interpreted today:  none  Pertinent Radiological findings (summarize): none   Lab Results  Component Value Date   WBC 5.1 10/24/2021   HGB 12.5 10/24/2021   HCT 39.7 10/24/2021   PLT 199 10/24/2021   GLUCOSE  95 10/24/2021   CHOL 137 10/27/2019   TRIG 66 10/27/2019   HDL 45 10/27/2019   LDLDIRECT 119 (H) 06/14/2009   LDLCALC 78 10/27/2019   ALT 16 10/24/2021   AST 26 10/24/2021   NA 138 10/24/2021   K 4.1 10/24/2021   CL 105 10/24/2021   CREATININE 0.68 10/24/2021   BUN 16 10/24/2021   CO2 26 10/24/2021   TSH 0.771 10/24/2021   INR 2.8 (H) 01/19/2013   HGBA1C 5.5 10/27/2019   Assessment/Plan:  Vylette Strubel is a 89 y.o. Black or African American [2] female with  has a past medical history of Bradycardia, Chronic diastolic heart failure ([place]), GERD (gastroesophageal reflux disease), cardiovascular stress test, echocardiogram, Hypertension, Long term current use of anticoagulant, Mild pulmonary hypertension ([place]), Obesity, Osteoarthritis, Permanent atrial fibrillation ([place]), Refusal of blood transfusions as patient is Jehovah's Witness, and Thoracic ascending aortic aneurysm ([place]).  No problem-specific Assessment & Plan notes found for this encounter.  Followup: No follow-ups on file.  Cathlean Cower, MD 02/15/2022 2:45 PM [facility]

## 2022-02-15 NOTE — Patient Instructions (Signed)
Please continue all other medications as before, and refills have been done if requested.  Please have the pharmacy call with any other refills you may need.  Please keep your appointments with your specialists as you may have planned  You will be contacted regarding the referral for: MRI for the abdominal, Dr Gloriann Loan for urology  Please go to the XRAY Department in the first floor for the x-ray testing  Please go to the LAB at the blood drawing area for the tests to be done  You will be contacted by phone if any changes need to be made immediately.  Otherwise, you will receive a letter about your results with an explanation, but please check with MyChart first.  Please remember to sign up for MyChart if you have not done so, as this will be important to you in the future with finding out test results, communicating by private email, and scheduling acute appointments online when needed.  Please make an Appointment to return in 3 months to Dr Ronnald Ramp, or sooner if needed

## 2022-02-16 ENCOUNTER — Encounter: Payer: Self-pay | Admitting: Internal Medicine

## 2022-02-16 NOTE — Assessment & Plan Note (Signed)
In light of recent wt loss and recent Oct 2023 CT abd/pelvis with possible left upper pole mass, will need MRI abd and refer urology

## 2022-02-16 NOTE — Assessment & Plan Note (Signed)
Stable rate and volume,  BP Readings from Last 3 Encounters:  02/15/22 128/78  01/01/22 (!) 148/82  12/20/21 130/66   Stable, pt to continue medical treatment lotrel 5/20

## 2022-02-16 NOTE — Assessment & Plan Note (Signed)
Etiology unclear, for lab including cbc

## 2022-02-16 NOTE — Assessment & Plan Note (Signed)
Also for [facility] with PT per daughter reqeust

## 2022-02-16 NOTE — Assessment & Plan Note (Signed)
Can't r/o malignancy related but also for lab including TSH

## 2022-02-18 ENCOUNTER — Telehealth: Payer: Self-pay | Admitting: Internal Medicine

## 2022-02-18 MED ORDER — CITALOPRAM HYDROBROMIDE 10 MG PO TABS: 10.0000 mg | ORAL_TABLET | Freq: Every day | ORAL | 3 refills | Status: DC

## 2022-02-18 NOTE — Telephone Encounter (Signed)
Pt's daughter, Blima Rich, has been informed.

## 2022-02-18 NOTE — Telephone Encounter (Signed)
Ok for celexa 10 qd - done to local pharmacy

## 2022-02-18 NOTE — Telephone Encounter (Signed)
Patient's daughter called and would like to know if something could be called in for depression or anxiety.  If so please send to [facility] on [address]:  Blima Rich - daughter - [phone]

## 2022-02-19 ENCOUNTER — Telehealth: Payer: Self-pay | Admitting: Internal Medicine

## 2022-02-19 NOTE — Telephone Encounter (Signed)
Patient's daughter called. She would like the referral for endocrinology to be sent to [place], [facility] with Dr. Chalmers Cater. The phone number is [phone]. Best callback number for patient is [phone]

## 2022-02-20 NOTE — Telephone Encounter (Signed)
Referral sent 

## 2022-02-26 ENCOUNTER — Ambulatory Visit
Admission: RE | Admit: 2022-02-26 | Discharge: 2022-02-26 | Disposition: A | Discharge status: Home / Self Care | Payer: 59 | Source: Ambulatory Visit | Attending: Internal Medicine | Admitting: Internal Medicine

## 2022-02-26 DIAGNOSIS — N2889 Other specified disorders of kidney and ureter: Secondary | ICD-10-CM | POA: Diagnosis not present

## 2022-02-26 DIAGNOSIS — R1900 Intra-abdominal and pelvic swelling, mass and lump, unspecified site: Secondary | ICD-10-CM

## 2022-02-26 MED ORDER — GADOPICLENOL 0.5 MMOL/ML IV SOLN
7.5000 mL | Freq: Once | INTRAVENOUS | Status: AC | PRN
  Administered 2022-02-26: 7.5 mL via INTRAVENOUS

## 2022-02-27 ENCOUNTER — Telehealth: Payer: Self-pay | Admitting: Internal Medicine

## 2022-02-27 ENCOUNTER — Telehealth: Payer: Self-pay

## 2022-02-27 DIAGNOSIS — K838 Other specified diseases of biliary tract: Secondary | ICD-10-CM

## 2022-02-27 NOTE — Telephone Encounter (Signed)
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    Also incidentally, there is a suspicion of CBD mass near the ampulla -   I will order the MRCP, and pt may need GI referral pending results

## 2022-02-27 NOTE — Telephone Encounter (Signed)
CRITICAL VALUE STICKER  CRITICAL VALUE: MR abdomen, Diane from [facility] wanted Dr to be aware of findings. They are located in pt chart under Imaging.  RECEIVER (on-site recipient of call): Jarrett Soho  DATE & TIME NOTIFIED: 02/27/22 8:55 am  MESSENGER (representative from lab): [facility]   MD NOTIFIED: Dr. Jenny Reichmann  TIME OF NOTIFICATION: 8:57 am

## 2022-02-28 ENCOUNTER — Other Ambulatory Visit: Payer: Self-pay | Admitting: Interventional Cardiology

## 2022-02-28 DIAGNOSIS — I4819 Other persistent atrial fibrillation: Secondary | ICD-10-CM

## 2022-02-28 NOTE — Telephone Encounter (Signed)
Eliquis 38m refill request received. Patient is 89years old, weight-75.8kg, Crea-0.72 on 02/15/22, Diagnosis-Afib, and last seen by MChristen Bameon 12/20/21. Dose is appropriate based on dosing criteria. Will send in refill to requested pharmacy.

## 2022-03-06 ENCOUNTER — Ambulatory Visit
Admission: RE | Admit: 2022-03-06 | Discharge: 2022-03-06 | Disposition: A | Discharge status: Home / Self Care | Payer: 59 | Source: Ambulatory Visit | Attending: Internal Medicine | Admitting: Internal Medicine

## 2022-03-06 DIAGNOSIS — N289 Disorder of kidney and ureter, unspecified: Secondary | ICD-10-CM | POA: Diagnosis not present

## 2022-03-06 DIAGNOSIS — K838 Other specified diseases of biliary tract: Secondary | ICD-10-CM

## 2022-03-06 DIAGNOSIS — R932 Abnormal findings on diagnostic imaging of liver and biliary tract: Secondary | ICD-10-CM | POA: Diagnosis not present

## 2022-03-06 DIAGNOSIS — K8689 Other specified diseases of pancreas: Secondary | ICD-10-CM | POA: Diagnosis not present

## 2022-03-11 ENCOUNTER — Telehealth: Payer: Self-pay

## 2022-03-11 NOTE — Telephone Encounter (Signed)
Pt daughter contacted the office to follow up regarding recent labs her mom had. Was advised to follow up with endocrinologist regarding results and recent weight loss weakness and fatigue. Would like to follow up as soon as possible.

## 2022-03-11 NOTE — Telephone Encounter (Signed)
Patient called back and said that another endocrinologist will need to be found - Dr. Soyla Murphy does not accept her insurance.  Patient states that it needs to be done quickly because patient is declining.  Patient #:  [phone]

## 2022-03-12 DIAGNOSIS — D4102 Neoplasm of uncertain behavior of left kidney: Secondary | ICD-10-CM | POA: Diagnosis not present

## 2022-03-12 NOTE — Telephone Encounter (Signed)
Patient's daughter, Bianca [name], called this morning requesting an appointment.  Appointment was scheduled on 04/05/2022 (first available) with same day labs (per daughter's request).  Bianca [name] was told that Bianca [name] would be put on waitlist and called if sooner cancellation.  Bianca [name] states that Bianca [name] has no energy and lays around all day.

## 2022-03-13 NOTE — Telephone Encounter (Signed)
Called and spoke with Daughter pt has an appt to see Dr. Dwyane Dee in March and she also has an appt with [facility] in June.

## 2022-03-13 NOTE — Telephone Encounter (Signed)
Pt's daughter says that she has been to her PCP last in December and they were advised to f/u with endocrinologist. She also had an ACTH test and blood work performed that showed overactive levels. Pts daughter would like to discuss starting thyroid medication due to these levels as well as extreme weight loss.

## 2022-03-14 NOTE — Telephone Encounter (Signed)
Patient scheduled for Friday 03/15/22 @ 1130 A - date OK per MD - left VM for daughter with appointment information and requested call back to confirm

## 2022-03-15 ENCOUNTER — Ambulatory Visit (INDEPENDENT_AMBULATORY_CARE_PROVIDER_SITE_OTHER): Payer: 59 | Admitting: Endocrinology

## 2022-03-15 ENCOUNTER — Encounter: Payer: Self-pay | Admitting: Endocrinology

## 2022-03-15 VITALS — BP 84/62 | HR 56 | Ht 66.0 in | Wt 165.0 lb

## 2022-03-15 DIAGNOSIS — I952 Hypotension due to drugs: Secondary | ICD-10-CM | POA: Diagnosis not present

## 2022-03-15 DIAGNOSIS — R634 Abnormal weight loss: Secondary | ICD-10-CM

## 2022-03-15 DIAGNOSIS — E042 Nontoxic multinodular goiter: Secondary | ICD-10-CM

## 2022-03-15 LAB — BASIC METABOLIC PANEL
BUN: 23 mg/dL (ref 6–23)
CO2: 32 mEq/L (ref 19–32)
Calcium: 10.7 mg/dL — ABNORMAL HIGH (ref 8.4–10.5)
Chloride: 103 mEq/L (ref 96–112)
Creatinine, Ser: 0.82 mg/dL (ref 0.40–1.20)
GFR: 63.2 mL/min (ref >60.00)
Glucose, Bld: 109 mg/dL — ABNORMAL HIGH (ref 70–99)
Potassium: 3.8 mEq/L (ref 3.5–5.1)
Sodium: 142 mEq/L (ref 135–145)

## 2022-03-15 LAB — TSH: TSH: 0.43 u[IU]/mL (ref 0.35–5.50)

## 2022-03-15 LAB — T3, FREE: T3, Free: 2.7 pg/mL (ref 2.3–4.2)

## 2022-03-15 LAB — CORTISOL: Cortisol, Plasma: 11.2 ug/dL

## 2022-03-15 LAB — T4, FREE: Free T4: 1.17 ng/dL (ref 0.60–1.60)

## 2022-03-15 NOTE — Progress Notes (Signed)
Patient ID: Bianca [name], female   DOB: January 05, 1931, 89 y.o.   MRN: [id number]                                                                                                               Reason for Appointment: Endocrinology follow-up   Chief complaint: Weakness and weight loss   History of Present Illness:   Baseline history on 06/02/2020 consultation For the last year patient has had symptoms of decreased appetite, weakness and weight loss She usually does not have palpitations although this may occur occasionally At times may have shakiness especially of her left hand She does think that she has been feeling excessively warm and sweaty including at night She was concerned about her feeling weak and tired The patient lost about 35 lbs since about a year ago She may have had a routine TSH test when being followed for her atrial fibrillation in 10/2019 and this was suppressed, previously had not had a TSH done since 2016 Since she had overt hyperthyroidism she had been on methimazole 5 mg daily  She had  radioactive iodine treatment done with 29 mCi on 08/16/2020 Methimazole has been stopped as of 10/22  Subsequently her thyroid levels have been normal with last visit in 2/23  She was seen relatively urgently in follow-up because of her continued complaints of weight loss, weakness, decreased appetite and nausea; she was told by her PCP that her recent workup was negative No heat or cold intolerance No palpitations  Labs showed screening TSH of 0.22 about a month ago but no T4 or T3 levels available  Wt Readings from Last 3 Encounters:  03/15/22 165 lb (74.8 kg)  02/15/22 167 lb (75.8 kg)  01/01/22 174 lb 6.4 oz (79.1 kg)     She may have also had a thyroid nodule palpated on exam and 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 If you have any questions, ask your nurse or doctor.          amLODipine-benazepril 5-20 MG capsule Commonly known as: LOTREL TAKE 1 CAPSULE BY MOUTH DAILY   calcium-vitamin D 500-200 MG-UNIT tablet Commonly known as: OSCAL WITH D Take 1 tablet by mouth daily.   carboxymethylcellulose 0.5 %  Soln Commonly known as: REFRESH PLUS Place 1 drop into both eyes daily as needed (dry eyes).   citalopram 10 MG tablet Commonly known as: CeleXA Take 1 tablet (10 mg total) by mouth daily.   [facility] Hair/Skin/Nails Tabs Take 1 tablet by mouth daily.   Eliquis 5 MG Tabs tablet Generic drug: apixaban TAKE 1 TABLET BY MOUTH TWICE  DAILY   fish oil-omega-3 fatty acids 1000 MG capsule Take 2 g by mouth daily.   furosemide 40 MG tablet Commonly known as: LASIX Take 1.5 tablets (60 mg total) by mouth daily. What changed: how much to take   gabapentin 100 MG capsule Commonly known as: NEURONTIN Take 100 mg by mouth 3 (three) times daily.   hydrocortisone cream 1 % Apply 1 application topically 2 (two) times daily. Only on face   latanoprost 0.005 % ophthalmic solution Commonly known as: XALATAN Place 1 drop into the left eye at bedtime.   Mapap Arthritis Pain 650 MG CR tablet Generic drug: acetaminophen TAKE 1 TABLET BY MOUTH  EVERY 8 HOURS AS NEEDED FOR PAIN   methocarbamol 500 MG tablet Commonly known as: ROBAXIN Take 1 tablet (500 mg total) by mouth every 8 (eight) hours as needed for muscle spasms.   Multi For Her  50+ Tabs Take 1 tablet by mouth daily.   nitroGLYCERIN 0.4 MG SL tablet Commonly known as: NITROSTAT DISSOLVE 1 TABLET UNDER THE TONGUE EVERY 5 MINUTES AS  NEEDED FOR CHEST PAIN. MAX  OF 3 TABLETS IN 15 MINUTES. CALL 911 IF PAIN PERSISTS.   nystatin-triamcinolone cream Commonly known as: MYCOLOG II Apply to under breasts and on abdomen twice daily   spironolactone 25 MG tablet Commonly known as: ALDACTONE Take 0.5 tablets (12.5 mg total) by mouth daily.   Vitamin D-3 25 MCG (1000 UT) Caps Take 1,000 Units by mouth daily.            Past Medical History:  Diagnosis Date   Bradycardia    a. Holter monitor in the past showed bradycardia/pauses, so she is not on nodal blockers.    Chronic diastolic heart failure ([place])    a. 12/2015 showed normal EF, mild LVH, 56mm ascending aortc aneurysm, severe LAE, mildly dilated RV with mildly reduced RVF, mild TR/PR, mildly increased PASP.   GERD (gastroesophageal reflux disease)    Hx of cardiovascular stress test    Lexiscan Myoview (10/14):  Fixed inf defect, no ischemia, study not gated   Hx of echocardiogram    Echo (11/14):  Mild LVH, EF 60-65%, mod to severe LAE, mod RVE, mild to mod reduced RVSF, mild RAE, PASP 38   Hypertension    Long term current use of anticoagulant    Mild pulmonary hypertension ([facility])    Obesity    Osteoarthritis    Permanent atrial fibrillation ([facility])    Refusal of blood transfusions as patient is Jehovah's Witness    Thoracic ascending aortic aneurysm ([facility])     Past Surgical History:  Procedure Laterality Date   ABDOMINAL HYSTERECTOMY     CARPAL TUNNEL RELEASE     CHOLECYSTECTOMY     IRRIGATION AND DEBRIDEMENT SEBACEOUS CYST  1980   sebaceous cysts removed from tail bone   KNEE ARTHROSCOPY Bilateral    TUBAL LIGATION      Family History  Problem Relation Age of Onset   Asthma Mother    Colon cancer Mother    Cancer Mother    Diabetes Sister    Diabetes Brother  Depression Daughter     Cancer Daughter    Breast cancer Daughter 53   Depression Daughter    Clotting disorder Daughter    Depression Daughter    Kidney cancer Son    Cancer Son    Breast cancer Maternal Grandmother    Diabetes Other        sibling   Hypertension Other        sibling   Heart attack Neg Hx     Social History:  reports that she quit smoking about 53 years ago. Her smoking use included cigarettes. She has a 5.00 pack-year smoking history. She has been exposed to tobacco smoke. She has never used smokeless tobacco. She reports that she does not drink alcohol and does not use drugs.  Allergies:  Allergies  Allergen Reactions   Hydrocodone Nausea And Vomiting   Morphine Itching   Penicillins Itching     Review of Systems  She is on chronic Eliquis treatment from cardiologist  She is on Lotrel for hypertension along with Lasix Recently complaining of some dizziness on standing up and is tending to be lying down most of the time at home, needs some help walking around with her daughter She says her home blood pressure monitor is not accurate or reliable  BP Readings from Last 3 Encounters:  03/15/22 94/62  02/15/22 128/78  01/01/22 (!) 148/82      Examination:    BP 94/62 (BP Location: Right Arm, Patient Position: Sitting, Cuff Size: Normal)   Pulse (!) 56   Ht '5\' 6"'$  (1.676 m)   Wt 165 lb (74.8 kg)   SpO2 98%   BMI 26.63 kg/m    Right thyroid lobe is enlarged about 2  times normal, relatively firm and smooth, mostly felt on swallowing  Left lobe is not palpable  Only has tremor on the left Biceps reflexes show normal relaxation but difficult to elicit Hands not unusually warm    Assessment/Plan:  History of Hyperthyroidism, partly from Graves' disease but also has a multinodular goiter  She has been euthyroid subsequent to radioactive iodine treatment in 08/2020  She is here because of continued weight loss and weakness  Currently no etiology of her symptoms is  evident except for unusually low blood pressure today, recent labs and radiology studies were reviewed  Thyroid functions will be checked today along with chemistry panel and cortisol For now she will hold off on taking her Lotrel Recommended that she go to the ER to get hydrated today Message sent to her PCP and cardiologist also  Further management to be decided  Elayne Snare 03/15/2022, 11:12 AM    Note: This office note was prepared with Dragon voice recognition system technology. Any transcriptional errors that result from this process are unintentional.  Addendum: All labs normal except for calcium of 10.7, no further endocrine workup or follow-up needed  Jowel Waltner Dwyane Dee

## 2022-03-15 NOTE — Patient Instructions (Signed)
Stop Lotrel

## 2022-03-16 ENCOUNTER — Other Ambulatory Visit: Payer: 59

## 2022-03-18 ENCOUNTER — Telehealth: Payer: Self-pay | Admitting: Internal Medicine

## 2022-03-18 NOTE — Telephone Encounter (Signed)
Pt daughter called in reference about her mom blood level being too low. Pt daughter want a phone called back about adjusting medication.  Best call back [phone]

## 2022-03-19 ENCOUNTER — Other Ambulatory Visit: Payer: Self-pay | Admitting: Internal Medicine

## 2022-03-19 NOTE — Telephone Encounter (Signed)
Called daughter she states mom BP on Friday when she saw Dr. Buddy Duty was 66/56 MD states it was too low. She has not check BP since. She states she will go by pharmacy to have done. Mom currently taking Amlodipine-Benazepril 5/20 mg daily.Marland KitchenJohny Chess

## 2022-03-19 NOTE — Telephone Encounter (Signed)
Called daughter no answer LMOM w/MD response.Marland KitchenJohny [name]

## 2022-04-05 ENCOUNTER — Ambulatory Visit: Payer: 59 | Admitting: Endocrinology

## 2022-04-21 NOTE — Progress Notes (Unsigned)
Cardiology Office Note   Date:  04/22/2022   ID:  Bianca [name], Bianca [name] 1930/02/18, MRN [id number]  PCP:  [name], MD    No chief complaint on file.  Chronic diastolic heart failure  Wt Readings from Last 3 Encounters:  04/22/22 162 lb 9.6 oz (73.8 kg)  03/15/22 165 lb (74.8 kg)  02/15/22 167 lb (75.8 kg)       History of Present Illness: Bianca [name] is a 89 y.o. female  Who has Permanent atrial fibrillation and chronic diastolic heart failure.  Echo in 12/17 showed:   Left ventricle: The cavity size was normal. There was mild   concentric hypertrophy. Systolic function was normal. Wall motion   was normal; there were no regional wall motion abnormalities. - Aortic valve: There was no regurgitation. - Ascending aorta: The ascending aorta was moderately dilated   measuring 47 mm. - Mitral valve: Mildly thickened leaflets . There was mild   regurgitation. - Left atrium: The atrium was severely dilated. - Right ventricle: The cavity size was mildly dilated. Wall   thickness was normal. Systolic function was mildly reduced. - Right atrium: The atrium was moderately dilated. - Tricuspid valve: There was mild regurgitation. - Pulmonic valve: There was mild regurgitation. - Pulmonary arteries: Systolic pressure was mildly increased. PA   peak pressure: 38 mm Hg (S). - Inferior vena cava: The vessel was normal in size.   MRA in 1/18 showed: Aneurysmal dilatation of the ascending thoracic aorta measuring up to 4.7 cm in maximal diameter. The aortic root/sinuses of Valsalva  do not show aneurysmal dilatation.   She has had leg swelling in the past.  She had been taking daily Lasix, although it was prescribed twice a day.   In 12/18, she had some chest discomfort, started epigastric and went up into her chest.  She had drank some soda and that helped.  She ate some sausages that she would not normally eat , that evening.  No further since that time.    In  May 2020, BP was checked at home.  Meds were not changed: "Given her advanced age and h/o bradycardia, will hold course for now then especially since she periodically swings down to 114-121."  Reported fatigue again in December 2023 echocardiogram showed: "Overall normal LV/RV function and valvular function.  Stable size of aorta. "  She had not been eating normally at that time.  Past Medical History:  Diagnosis Date   Bradycardia    a. Holter monitor in the past showed bradycardia/pauses, so she is not on nodal blockers.    Chronic diastolic heart failure    a. 12/2015 showed normal EF, mild LVH, ascending aortc aneurysm, severe LAE, mildly dilated RV with mildly reduced RVF, mild TR/PR, mildly increased PASP.   GERD (gastroesophageal reflux disease)    Hx of cardiovascular stress test    Lexiscan Myoview (10/14):  Fixed inf defect, no ischemia, study not gated   Hx of echocardiogram    Echo (11/14):  Mild LVH, EF 60-65%, mod to severe LAE, mod RVE, mild to mod reduced RVSF, mild RAE, PASP 38   Hypertension    Long term current use of anticoagulant    Mild pulmonary hypertension    Obesity    Osteoarthritis    Permanent atrial fibrillation    Refusal of blood transfusions as patient is Jehovah's Witness    Thoracic ascending aortic aneurysm     Past Surgical History:  Procedure  Laterality Date   ABDOMINAL HYSTERECTOMY     CARPAL TUNNEL RELEASE     CHOLECYSTECTOMY     IRRIGATION AND DEBRIDEMENT SEBACEOUS CYST  1980   sebaceous cysts removed from tail bone   KNEE ARTHROSCOPY Bilateral    TUBAL LIGATION       Current Outpatient Medications  Medication Sig Dispense Refill   apixaban (ELIQUIS) 5 MG TABS tablet TAKE 1 TABLET BY MOUTH TWICE  DAILY 200 tablet 1   calcium-vitamin D (OSCAL WITH D 500-200) 500-200 MG-UNIT per tablet Take 1 tablet by mouth daily.     carboxymethylcellulose (REFRESH PLUS) 0.5 % SOLN Place 1 drop into both eyes daily as needed (dry eyes).      Cholecalciferol (VITAMIN D-3) 1000 UNITS CAPS Take 1,000 Units by mouth daily.      citalopram (CELEXA) 10 MG tablet Take 1 tablet (10 mg total) by mouth daily. 90 tablet 3   fish oil-omega-3 fatty acids 1000 MG capsule Take 2 g by mouth daily.     furosemide (LASIX) 40 MG tablet Take 1.5 tablets (60 mg total) by mouth daily. (Patient taking differently: Take 40 mg by mouth daily.) 135 tablet 3   hydrocortisone cream 1 % Apply 1 application topically 2 (two) times daily. Only on face 30 g 0   latanoprost (XALATAN) 0.005 % ophthalmic solution Place 1 drop into the left eye at bedtime.     MAPAP ARTHRITIS PAIN 650 MG CR tablet TAKE 1 TABLET BY MOUTH  EVERY 8 HOURS AS NEEDED FOR PAIN 120 tablet 0   Multiple Vitamins-Minerals (MULTI FOR HER 50+) TABS Take 1 tablet by mouth daily.     nitroGLYCERIN (NITROSTAT) 0.4 MG SL tablet DISSOLVE 1 TABLET UNDER THE TONGUE EVERY 5 MINUTES AS  NEEDED FOR CHEST PAIN. MAX  OF 3 TABLETS IN 15 MINUTES. CALL 911 IF PAIN PERSISTS. 25 tablet 3   Specialty Vitamins Products ([facility] HAIR/SKIN/NAILS) TABS Take 1 tablet by mouth daily.     spironolactone (ALDACTONE) 25 MG tablet Take 0.5 tablets (12.5 mg total) by mouth daily. 45 tablet 3   gabapentin (NEURONTIN) 100 MG capsule Take 100 mg by mouth 3 (three) times daily. (Patient not taking: Reported on 04/22/2022)     methocarbamol (ROBAXIN) 500 MG tablet Take 1 tablet (500 mg total) by mouth every 8 (eight) hours as needed for muscle spasms. (Patient not taking: Reported on 03/15/2022) 10 tablet 0   nystatin-triamcinolone (MYCOLOG II) cream Apply to under breasts and on abdomen twice daily (Patient not taking: Reported on 03/15/2022) 30 g 0   No current facility-administered medications for this visit.    Allergies:   Hydrocodone, Morphine, and Penicillins    Social History:  The patient  reports that she quit smoking about 53 years ago. Her smoking use included cigarettes. She has a 5.00 pack-year smoking history. She has been  exposed to tobacco smoke. She has never used smokeless tobacco. She reports that she does not drink alcohol and does not use drugs.   Family History:  The patient's family history includes Asthma in her mother; Breast cancer in her maternal grandmother; Breast cancer (age of onset: 80) in her daughter; Cancer in her daughter, mother, and son; Clotting disorder in her daughter; Colon cancer in her mother; Depression in her daughter, daughter, and daughter; Diabetes in her brother, sister, and another family member; Hypertension in an other family member; Kidney cancer in her son.    ROS:  Please see the history of present illness.  Otherwise, review of systems are positive for some unsteadiness.   All other systems are reviewed and negative.    PHYSICAL EXAM: VS:  BP 138/86   Pulse 61   Ht 5\' 6"  (1.676 m)   Wt 162 lb 9.6 oz (73.8 kg)   SpO2 97%   BMI 26.24 kg/m  , BMI Body mass index is 26.24 kg/m. GEN: Well nourished, somewhat frail, in no acute distress HEENT: normal Neck: no JVD, carotid bruits, or masses Cardiac: RRR; no murmurs, rubs, or gallops,no edema  Respiratory:  clear to auscultation bilaterally, normal work of breathing GI: soft, nontender, nondistended, + BS MS: no deformity or atrophy Skin: warm and dry, no rash Neuro:  Strength and sensation are intact; walks with a cane Psych: euthymic mood, full affect   EKG:   The ekg ordered 10/23 demonstrates AFib, rate controlled.   Recent Labs: 10/24/2021: Magnesium 2.2 02/15/2022: ALT 24; Hemoglobin 13.5; Platelets 204.0 03/15/2022: BUN 23; Creatinine, Ser 0.82; Potassium 3.8; Sodium 142; TSH 0.43   Lipid Panel    Component Value Date/Time   CHOL 168 02/15/2022 1513   CHOL 137 10/27/2019 1048   TRIG 82.0 02/15/2022 1513   HDL 49.50 02/15/2022 1513   HDL 45 10/27/2019 1048   CHOLHDL 3 02/15/2022 1513   VLDL 16.4 02/15/2022 1513   LDLCALC 102 (H) 02/15/2022 1513   LDLCALC 78 10/27/2019 1048   LDLDIRECT 119 (H)  06/14/2009 1819     Other studies Reviewed: Additional studies/ records that were reviewed today with results demonstrating: 2024 echo showed Overall normal LV/RV function and valvular function.  Stable size of aorta. .   ASSESSMENT AND PLAN:  Chronic diastolic heart failure: Low-salt diet.  Appears euvolemic. Continue furosemide. Atrial fibrillation: rate controlled. Eliquis for stroke prevention.  Rare falls. None recently.  No injuries.  Thoracic aneurysm: Not a candidate for surgery.  Would not pursue imaging at this time.  Anticoagulated: Needs to avoid falls.  This is her top priority especially since she is on anticoagulation. Hypertensive heart disease: Avoid processed foods.  The current medical regimen is effective;  continue present plan and medications.  Home BP readings have been controlled for the most part.  Medicine was decreased per her report after weight loss- related to thyroid issues.  On occasion, she takes a benazepril for high readings in the 150s.   Current medicines are reviewed at length with the patient today.  The patient concerns regarding her medicines were addressed.  The following changes have been made:  No change  Labs/ tests ordered today include:  No orders of the defined types were placed in this encounter.   Recommend 150 minutes/week of aerobic exercise Low fat, low carb, high fiber diet recommended  Disposition:   FU in 6 month with APP, 1 year with me   Signed, Lance MussJayadeep Saidah Kempton, MD  04/22/2022 11:17 AM    [facility] [address], [place], [place]  [address] Phone: [phone]; Fax: [phone]

## 2022-04-22 ENCOUNTER — Encounter: Payer: Self-pay | Admitting: Interventional Cardiology

## 2022-04-22 ENCOUNTER — Ambulatory Visit: Payer: 59 | Attending: Interventional Cardiology | Admitting: Interventional Cardiology

## 2022-04-22 VITALS — BP 138/86 | HR 61 | Ht 66.0 in | Wt 162.6 lb

## 2022-04-22 DIAGNOSIS — I7121 Aneurysm of the ascending aorta, without rupture: Secondary | ICD-10-CM

## 2022-04-22 DIAGNOSIS — Z7901 Long term (current) use of anticoagulants: Secondary | ICD-10-CM

## 2022-04-22 DIAGNOSIS — I4821 Permanent atrial fibrillation: Secondary | ICD-10-CM

## 2022-04-22 DIAGNOSIS — I5032 Chronic diastolic (congestive) heart failure: Secondary | ICD-10-CM | POA: Diagnosis not present

## 2022-04-22 DIAGNOSIS — I110 Hypertensive heart disease with heart failure: Secondary | ICD-10-CM

## 2022-04-22 NOTE — Patient Instructions (Signed)
Medication Instructions:  Your physician recommends that you continue on your current medications as directed. Please refer to the Current Medication list given to you today.  *If you need a refill on your cardiac medications before your next appointment, please call your pharmacy*   Lab Work: none If you have labs (blood work) drawn today and your tests are completely normal, you will receive your results only by: MyChart Message (if you have MyChart) OR A paper copy in the mail If you have any lab test that is abnormal or we need to change your treatment, we will call you to review the results.   Testing/Procedures: none   Follow-Up: At [facility], you and your health needs are our priority.  As part of our continuing mission to provide you with exceptional heart care, we have created designated Provider Care Teams.  These Care Teams include your primary Cardiologist (physician) and Advanced Practice Providers (APPs -  Physician Assistants and Nurse Practitioners) who all work together to provide you with the care you need, when you need it.  We recommend signing up for the patient portal called "MyChart".  Sign up information is provided on this After Visit Summary.  MyChart is used to connect with patients for Virtual Visits (Telemedicine).  Patients are able to view lab/test results, encounter notes, upcoming appointments, etc.  Non-urgent messages can be sent to your provider as well.   To learn more about what you can do with MyChart, go to [email or web address].    Your next appointment:   6 month(s)  Provider:   Jari Favre, PA-C, Ronie Spies, PA-C, Robin Searing, NP, Jacolyn Reedy, PA-C, Eligha Bridegroom, NP, or [name], PA-C     Then, Lance Muss, MD will plan to see you again in 12 month(s).    Other Instructions

## 2022-05-08 ENCOUNTER — Telehealth: Payer: Self-pay

## 2022-05-08 NOTE — Telephone Encounter (Signed)
Contacted [name] to schedule their annual wellness visit. Appointment made for 05/15/22.  Agnes Lawrence, CMA (AAMA)  [facility] [phone]

## 2022-05-15 ENCOUNTER — Ambulatory Visit (INDEPENDENT_AMBULATORY_CARE_PROVIDER_SITE_OTHER): Payer: 59

## 2022-05-15 VITALS — Ht 66.0 in | Wt 163.0 lb

## 2022-05-15 DIAGNOSIS — Z0000 Encounter for general adult medical examination without abnormal findings: Secondary | ICD-10-CM | POA: Diagnosis not present

## 2022-05-15 NOTE — Patient Instructions (Signed)
Bianca [name] , Thank you for taking time to come for your Medicare Wellness Visit. I appreciate your ongoing commitment to your health goals. Please review the following plan we discussed and let me know if I can assist you in the future.   These are the goals we discussed:  Goals      Client understands the importance of follow-up with providers by attending scheduled visits        This is a list of the screening recommended for you and due dates:  Health Maintenance  Topic Date Due   COVID-19 Vaccine (7 - 2023-24 season) 12/13/2021   Flu Shot  08/15/2022   Medicare Annual Wellness Visit  05/15/2023   DTaP/Tdap/Td vaccine (5 - Td or Tdap) 03/25/2028   Pneumonia Vaccine  Completed   DEXA scan (bone density measurement)  Completed   Zoster (Shingles) Vaccine  Completed   HPV Vaccine  Aged Out    Advanced directives: Yes, documents on file.  Conditions/risks identified: Yes; High Risk Falls  Next appointment: Follow up in one year for your annual wellness visit.   Preventive Care 19 Years and Older, Female Preventive care refers to lifestyle choices and visits with your health care provider that can promote health and wellness. What does preventive care include? A yearly physical exam. This is also called an annual well check. Dental exams once or twice a year. Routine eye exams. Ask your health care provider how often you should have your eyes checked. Personal lifestyle choices, including: Daily care of your teeth and gums. Regular physical activity. Eating a healthy diet. Avoiding tobacco and drug use. Limiting alcohol use. Practicing safe sex. Taking low-dose aspirin every day. Taking vitamin and mineral supplements as recommended by your health care provider. What happens during an annual well check? The services and screenings done by your health care provider during your annual well check will depend on your age, overall health, lifestyle risk factors, and family  history of disease. Counseling  Your health care provider may ask you questions about your: Alcohol use. Tobacco use. Drug use. Emotional well-being. Home and relationship well-being. Sexual activity. Eating habits. History of falls. Memory and ability to understand (cognition). Work and work Astronomer. Reproductive health. Screening  You may have the following tests or measurements: Height, weight, and BMI. Blood pressure. Lipid and cholesterol levels. These may be checked every 5 years, or more frequently if you are over 34 years old. Skin check. Lung cancer screening. You may have this screening every year starting at age 42 if you have a 30-pack-year history of smoking and currently smoke or have quit within the past 15 years. Fecal occult blood test (FOBT) of the stool. You may have this test every year starting at age 52. Flexible sigmoidoscopy or colonoscopy. You may have a sigmoidoscopy every 5 years or a colonoscopy every 10 years starting at age 64. Hepatitis C blood test. Hepatitis B blood test. Sexually transmitted disease (STD) testing. Diabetes screening. This is done by checking your blood sugar (glucose) after you have not eaten for a while (fasting). You may have this done every 1-3 years. Bone density scan. This is done to screen for osteoporosis. You may have this done starting at age 89. Mammogram. This may be done every 1-2 years. Talk to your health care provider about how often you should have regular mammograms. Talk with your health care provider about your test results, treatment options, and if necessary, the need for more tests. Vaccines  Your health care provider may recommend certain vaccines, such as: Influenza vaccine. This is recommended every year. Tetanus, diphtheria, and acellular pertussis (Tdap, Td) vaccine. You may need a Td booster every 10 years. Zoster vaccine. You may need this after age 39. Pneumococcal 13-valent conjugate (PCV13)  vaccine. One dose is recommended after age 45. Pneumococcal polysaccharide (PPSV23) vaccine. One dose is recommended after age 54. Talk to your health care provider about which screenings and vaccines you need and how often you need them. This information is not intended to replace advice given to you by your health care provider. Make sure you discuss any questions you have with your health care provider. Document Released: 01/27/2015 Document Revised: 09/20/2015 Document Reviewed: 11/01/2014 Elsevier Interactive Patient Education  2017 [place] Prevention in the Home Falls can cause injuries. They can happen to people of all ages. There are many things you can do to make your home safe and to help prevent falls. What can I do on the outside of my home? Regularly fix the edges of walkways and driveways and fix any cracks. Remove anything that might make you trip as you walk through a door, such as a raised step or threshold. Trim any bushes or trees on the path to your home. Use bright outdoor lighting. Clear any walking paths of anything that might make someone trip, such as rocks or tools. Regularly check to see if handrails are loose or broken. Make sure that both sides of any steps have handrails. Any raised decks and porches should have guardrails on the edges. Have any leaves, snow, or ice cleared regularly. Use sand or salt on walking paths during winter. Clean up any spills in your garage right away. This includes oil or grease spills. What can I do in the bathroom? Use night lights. Install grab bars by the toilet and in the tub and shower. Do not use towel bars as grab bars. Use non-skid mats or decals in the tub or shower. If you need to sit down in the shower, use a plastic, non-slip stool. Keep the floor dry. Clean up any water that spills on the floor as soon as it happens. Remove soap buildup in the tub or shower regularly. Attach bath mats securely with  double-sided non-slip rug tape. Do not have throw rugs and other things on the floor that can make you trip. What can I do in the bedroom? Use night lights. Make sure that you have a light by your bed that is easy to reach. Do not use any sheets or blankets that are too big for your bed. They should not hang down onto the floor. Have a firm chair that has side arms. You can use this for support while you get dressed. Do not have throw rugs and other things on the floor that can make you trip. What can I do in the kitchen? Clean up any spills right away. Avoid walking on wet floors. Keep items that you use a lot in easy-to-reach places. If you need to reach something above you, use a strong step stool that has a grab bar. Keep electrical cords out of the way. Do not use floor polish or wax that makes floors slippery. If you must use wax, use non-skid floor wax. Do not have throw rugs and other things on the floor that can make you trip. What can I do with my stairs? Do not leave any items on the stairs. Make sure that there are  handrails on both sides of the stairs and use them. Fix handrails that are broken or loose. Make sure that handrails are as long as the stairways. Check any carpeting to make sure that it is firmly attached to the stairs. Fix any carpet that is loose or worn. Avoid having throw rugs at the top or bottom of the stairs. If you do have throw rugs, attach them to the floor with carpet tape. Make sure that you have a light switch at the top of the stairs and the bottom of the stairs. If you do not have them, ask someone to add them for you. What else can I do to help prevent falls? Wear shoes that: Do not have high heels. Have rubber bottoms. Are comfortable and fit you well. Are closed at the toe. Do not wear sandals. If you use a stepladder: Make sure that it is fully opened. Do not climb a closed stepladder. Make sure that both sides of the stepladder are locked  into place. Ask someone to hold it for you, if possible. Clearly mark and make sure that you can see: Any grab bars or handrails. First and last steps. Where the edge of each step is. Use tools that help you move around (mobility aids) if they are needed. These include: Canes. Walkers. Scooters. Crutches. Turn on the lights when you go into a dark area. Replace any light bulbs as soon as they burn out. Set up your furniture so you have a clear path. Avoid moving your furniture around. If any of your floors are uneven, fix them. If there are any pets around you, be aware of where they are. Review your medicines with your doctor. Some medicines can make you feel dizzy. This can increase your chance of falling. Ask your doctor what other things that you can do to help prevent falls. This information is not intended to replace advice given to you by your health care provider. Make sure you discuss any questions you have with your health care provider. Document Released: 10/27/2008 Document Revised: 06/08/2015 Document Reviewed: 02/04/2014 Elsevier Interactive Patient Education  2017 [employer].

## 2022-05-15 NOTE — Progress Notes (Signed)
I connected with  [name] and daughter, Arvid Right on 05/15/22 by a audio enabled telemedicine application and verified that I am speaking with the correct person using two identifiers.  Patient Location: Home  Provider Location: Office/Clinic  I discussed the limitations of evaluation and management by telemedicine. The patient expressed understanding and agreed to proceed.  Subjective:   Bianca [name] is a 89 y.o. female who presents for Medicare Annual (Subsequent) preventive examination.  Review of Systems     Cardiac Risk Factors include: advanced age (>34men, >64 women);family history of premature cardiovascular disease;sedentary lifestyle     Objective:    Today's Vitals   05/15/22 0849  Weight: 163 lb (73.9 kg)  Height: 5\' 6"  (1.676 m)  PainSc: 0-No pain   Body mass index is 26.31 kg/m.     05/15/2022    9:02 AM 11/10/2021   10:40 PM 10/24/2021    5:29 PM 10/05/2021    1:26 PM 03/06/2021    9:19 AM 11/01/2020   10:42 AM 05/15/2020    1:28 PM  Advanced Directives  Does Patient Have a Medical Advance Directive? Yes No No No Yes No No  Type of Estate agent of [place];Living will    Healthcare Power of Attorney    Does patient want to make changes to medical advance directive? No - Patient declined    No - Patient declined    Copy of Healthcare Power of Attorney in Chart? Yes - validated most recent copy scanned in chart (See row information)    Yes - validated most recent copy scanned in chart (See row information)    Would patient like information on creating a medical advance directive?    No - Patient declined  No - Patient declined No - Patient declined    Current Medications (verified) Outpatient Encounter Medications as of 05/15/2022  Medication Sig   apixaban (ELIQUIS) 5 MG TABS tablet TAKE 1 TABLET BY MOUTH TWICE  DAILY   calcium-vitamin D (OSCAL WITH D 500-200) 500-200 MG-UNIT per tablet Take 1 tablet by mouth daily.    carboxymethylcellulose (REFRESH PLUS) 0.5 % SOLN Place 1 drop into both eyes daily as needed (dry eyes).   Cholecalciferol (VITAMIN D-3) 1000 UNITS CAPS Take 1,000 Units by mouth daily.    citalopram (CELEXA) 10 MG tablet Take 1 tablet (10 mg total) by mouth daily.   fish oil-omega-3 fatty acids 1000 MG capsule Take 2 g by mouth daily.   furosemide (LASIX) 40 MG tablet Take 1.5 tablets (60 mg total) by mouth daily. (Patient taking differently: Take 40 mg by mouth daily.)   gabapentin (NEURONTIN) 100 MG capsule Take 100 mg by mouth 3 (three) times daily. (Patient not taking: Reported on 04/22/2022)   hydrocortisone cream 1 % Apply 1 application topically 2 (two) times daily. Only on face   latanoprost (XALATAN) 0.005 % ophthalmic solution Place 1 drop into the left eye at bedtime.   MAPAP ARTHRITIS PAIN 650 MG CR tablet TAKE 1 TABLET BY MOUTH  EVERY 8 HOURS AS NEEDED FOR PAIN   methocarbamol (ROBAXIN) 500 MG tablet Take 1 tablet (500 mg total) by mouth every 8 (eight) hours as needed for muscle spasms. (Patient not taking: Reported on 03/15/2022)   Multiple Vitamins-Minerals (MULTI FOR HER 50+) TABS Take 1 tablet by mouth daily.   nitroGLYCERIN (NITROSTAT) 0.4 MG SL tablet DISSOLVE 1 TABLET UNDER THE TONGUE EVERY 5 MINUTES AS  NEEDED FOR CHEST PAIN. MAX  OF 3 TABLETS  IN 15 MINUTES. CALL 911 IF PAIN PERSISTS.   nystatin-triamcinolone (MYCOLOG II) cream Apply to under breasts and on abdomen twice daily (Patient not taking: Reported on 03/15/2022)   Specialty Vitamins Products ([facility] HAIR/SKIN/NAILS) TABS Take 1 tablet by mouth daily.   spironolactone (ALDACTONE) 25 MG tablet Take 0.5 tablets (12.5 mg total) by mouth daily.   No facility-administered encounter medications on file as of 05/15/2022.    Allergies (verified) Hydrocodone, Morphine, and Penicillins   History: Past Medical History:  Diagnosis Date   Bradycardia    a. Holter monitor in the past showed bradycardia/pauses, so she is not on nodal  blockers.    Chronic diastolic heart failure (HCC)    a. 12/2015 showed normal EF, mild LVH, ascending aortc aneurysm, severe LAE, mildly dilated RV with mildly reduced RVF, mild TR/PR, mildly increased PASP.   GERD (gastroesophageal reflux disease)    Hx of cardiovascular stress test    Lexiscan Myoview (10/14):  Fixed inf defect, no ischemia, study not gated   Hx of echocardiogram    Echo (11/14):  Mild LVH, EF 60-65%, mod to severe LAE, mod RVE, mild to mod reduced RVSF, mild RAE, PASP 38   Hypertension    Long term current use of anticoagulant    Mild pulmonary hypertension (HCC)    Obesity    Osteoarthritis    Permanent atrial fibrillation (HCC)    Refusal of blood transfusions as patient is Jehovah's Witness    Thoracic ascending aortic aneurysm (HCC)    Past Surgical History:  Procedure Laterality Date   ABDOMINAL HYSTERECTOMY     CARPAL TUNNEL RELEASE     CHOLECYSTECTOMY     IRRIGATION AND DEBRIDEMENT SEBACEOUS CYST  1980   sebaceous cysts removed from tail bone   KNEE ARTHROSCOPY Bilateral    TUBAL LIGATION     Family History  Problem Relation Age of Onset   Asthma Mother    Colon cancer Mother    Cancer Mother    Diabetes Sister    Diabetes Brother    Depression Daughter    Cancer Daughter    Breast cancer Daughter 57   Depression Daughter    Clotting disorder Daughter    Depression Daughter    Kidney cancer Son    Cancer Son    Breast cancer Maternal Grandmother    Diabetes Other        sibling   Hypertension Other        sibling   Heart attack Neg Hx    Social History   Socioeconomic History   Marital status: Widowed    Spouse name: Not on file   Number of children: 11   Years of education: 9   Highest education level: 9th grade  Occupational History   Occupation: Retired    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 She has 11 children. One of her sons passed away as an infant, and one of her daughters was killed by a boyfriend at 20 years of age. She reports that her remaining children have a lot of health problems and this makes her very sad.       She is a TEFL teacher witness.       Current Social History       Who lives at home: Patient lives alone in one level home    Transportation: Patient is driven by her niece and  granddaughter. Currently applying for medicare transportation.    Pets: None    Education / Work:  10 th grade/ Retired    Location manager / Fun: Family events, picnics, out to eat   Religious / Personal Beliefs: Jehovah's Witness                                                                                                       Social Determinants of Health   Financial Resource Strain: Low Risk  (05/15/2022)   Overall Financial Resource Strain (CARDIA)    Difficulty of Paying Living Expenses: Not hard at all  Food Insecurity: No Food Insecurity (05/15/2022)   Hunger Vital Sign    Worried About Running Out of Food in the Last Year: Never true    Ran Out of Food in the Last Year: Never true  Transportation Needs: No Transportation Needs (05/15/2022)   PRAPARE - Administrator, Civil Service (Medical): No    Lack of Transportation (Non-Medical): No  Physical Activity: Inactive (05/15/2022)   Exercise Vital Sign    Days of Exercise per Week: 0 days    Minutes of Exercise per Session: 0 min  Stress: No Stress Concern Present (05/15/2022)   [employer] of  Occupational Health - Occupational Stress Questionnaire    Feeling of Stress : Not at all  Social Connections: Moderately Isolated (05/15/2022)   Social Connection and Isolation Panel [NHANES]    Frequency of Communication with Friends and Family: More than three times a week    Frequency of Social Gatherings with Friends and Family: More than three times a week    Attends Religious Services: More than 4 times per year    Active Member of [employer] or Organizations: No    Attends Banker Meetings: Never    Marital Status: Widowed    Tobacco Counseling Counseling given: Not Answered   Clinical Intake:  Pre-visit preparation completed: Yes  Pain : No/denies pain Pain Score: 0-No pain     BMI - recorded: 26.31 Nutritional Status: BMI 25 -29 Overweight Nutritional Risks: None Diabetes: No  How often do you need to have someone help you when you read instructions, pamphlets, or other written materials from your doctor or pharmacy?: 1 - Never What is the last grade level you completed in school?: 10th grade  Diabetic? No  Interpreter Needed?: No  Information entered by :: Tiajuana Leppanen N. Tangela Dolliver, LPN.   Activities of Daily  Living    05/15/2022    9:03 AM  In your present state of health, do you have any difficulty performing the following activities:  Hearing? 0  Vision? 0  Difficulty concentrating or making decisions? 1  Walking or climbing stairs? 1  Dressing or bathing? 1  Doing errands, shopping? 1  Preparing Food and eating ? N  Using the Toilet? N  In the past six months, have you accidently leaked urine? N  Do you have problems with loss of bowel control? N  Managing your Medications? N  Managing your Finances? Y  Housekeeping or managing your Housekeeping? Y    Patient Care Team: [name], MD as PCP - General (Internal Medicine) Corky Crafts, MD as PCP - Cardiology (Cardiology) Mateo Flow, MD (Ophthalmology) Bennetta Laos, DDS  (General Practice) Reather Littler, MD as Consulting Physician (Endocrinology)  Indicate any recent Medical Services you may have received from other than [facility] providers in the past year (date may be approximate).     Assessment:   This is a routine wellness examination for [facility].  Hearing/Vision screen Hearing Screening - Comments:: Denies hearing difficulties.   Vision Screening - Comments:: Wears rx glasses - up to date with routine eye exams with Mateo Flow, MD.   Dietary issues and exercise activities discussed: Current Exercise Habits: The patient does not participate in regular exercise at present, Exercise limited by: cardiac condition(s);orthopedic condition(s)   Goals Addressed             This Visit's Progress    Client understands the importance of follow-up with providers by attending scheduled visits        Depression Screen    05/15/2022    9:01 AM 02/15/2022    2:30 PM 01/01/2022    9:59 AM 10/05/2021    1:27 PM 03/06/2021    9:16 AM 11/01/2020   10:42 AM 05/15/2020    1:27 PM  PHQ 2/9 Scores  PHQ - 2 Score 0 0 0 0 0 0 0  PHQ- 9 Score 4 0  8  9 6     Fall Risk    05/15/2022    8:55 AM 02/15/2022    2:31 PM 01/01/2022    9:59 AM 10/05/2021    1:27 PM 03/06/2021    9:20 AM  Fall Risk   Falls in the past year? 1 1 0 0 1  Number falls in past yr: 1 0 0  1  Injury with Fall? 1 0 0  0  Risk for fall due to : History of fall(s);Impaired balance/gait;Orthopedic patient Impaired balance/gait No Fall Risks  Orthopedic patient;Impaired balance/gait  Follow up Education provided;Falls prevention discussed Falls evaluation completed Falls evaluation completed      FALL RISK PREVENTION PERTAINING TO THE HOME:  Any stairs in or around the home? No  If so, are there any without handrails? No  Home free of loose throw rugs in walkways, pet beds, electrical cords, etc? Yes  Adequate lighting in your home to reduce risk of falls? Yes   ASSISTIVE DEVICES UTILIZED TO  PREVENT FALLS:  Life alert? Yes  Use of a cane, walker or w/c? Yes  Grab bars in the bathroom? No  Shower chair or bench in shower? No  Elevated toilet seat or a handicapped toilet? No   TIMED UP AND GO:  Was the test performed? No . Telephonic Visit  Cognitive Function:    05/15/2022    9:04 AM  MMSE - Mini  Mental State Exam  Not completed: Unable to complete        03/06/2021    9:23 AM  6CIT Screen  What Year? 0 points  What month? 0 points  What time? 0 points  Count back from 20 2 points  Months in reverse 4 points  Repeat phrase 4 points  Total Score 10 points    Immunizations Immunization History  Administered Date(s) Administered   Fluad Quad(high Dose 65+) 09/17/2018, 08/16/2019   Influenza Whole 10/08/2006, 10/28/2007, 10/24/2008   Influenza, High Dose Seasonal PF 10/15/2013, 09/26/2014, 10/14/2016   Influenza,inj,Quad PF,6+ Mos 10/06/2012, 09/11/2015   Influenza-Unspecified 09/14/2017, 10/06/2017, 09/04/2021   PFIZER(Purple Top)SARS-COV-2 Vaccination 02/10/2019, 03/03/2019, 09/24/2019, 05/04/2020, 10/18/2021   Pfizer Covid-19 Vaccine Bivalent Booster 50yrs & up 11/01/2020   Pneumococcal Conjugate-13 05/03/2015   Pneumococcal Polysaccharide-23 11/14/1997   Td 01/14/1994, 01/14/2005, 10/22/2007   Tdap 03/26/2018   Zoster Recombinat (Shingrix) 10/28/2016, 01/08/2017   Zoster, Live 10/06/2012    TDAP status: Up to date  Flu Vaccine status: Up to date  Pneumococcal vaccine status: Up to date  Covid-19 vaccine status: Completed vaccines  Qualifies for Shingles Vaccine? Yes   Zostavax completed Yes   Shingrix Completed?: Yes  Screening Tests Health Maintenance  Topic Date Due   COVID-19 Vaccine (7 - 2023-24 season) 12/13/2021   INFLUENZA VACCINE  08/15/2022   Medicare Annual Wellness (AWV)  05/15/2023   DTaP/Tdap/Td (5 - Td or Tdap) 03/25/2028   Pneumonia Vaccine 67+ Years old  Completed   DEXA SCAN  Completed   Zoster Vaccines- Shingrix   Completed   HPV VACCINES  Aged Out    Health Maintenance  Health Maintenance Due  Topic Date Due   COVID-19 Vaccine (7 - 2023-24 season) 12/13/2021    Colorectal cancer screening: No longer required.   Mammogram status: Completed 11/15/2021. Repeat every year  Bone Density Scan: Never done  Lung Cancer Screening: (Low Dose CT Chest recommended if Age 71-80 years, 30 pack-year currently smoking OR have quit w/in 15years.) does not qualify.   Lung Cancer Screening Referral: No  Additional Screening:  Hepatitis C Screening: does not qualify; Completed: No  Vision Screening: Recommended annual ophthalmology exams for early detection of glaucoma and other disorders of the eye. Is the patient up to date with their annual eye exam?  Yes  Who is the provider or what is the name of the office in which the patient attends annual eye exams? Mateo Flow, MD. If pt is not established with a provider, would they like to be referred to a provider to establish care? No .   Dental Screening: Recommended annual dental exams for proper oral hygiene  Community Resource Referral / Chronic Care Management: CRR required this visit?  No   CCM required this visit?  No      Plan:     I have personally reviewed and noted the following in the patient's chart:   Medical and social history Use of alcohol, tobacco or illicit drugs  Current medications and supplements including opioid prescriptions. Patient is not currently taking opioid prescriptions. Functional ability and status Nutritional status Physical activity Advanced directives List of other physicians Hospitalizations, surgeries, and ER visits in previous 12 months Vitals Screenings to include cognitive, depression, and falls Referrals and appointments  In addition, I have reviewed and discussed with patient certain preventive protocols, quality metrics, and best practice recommendations. A written personalized care plan for  preventive services as well as general preventive health recommendations were  provided to patient.     Mickeal Needy, LPN   01/19/1094   Nurse Notes: Patient not able to complete cognitive testing during this visit.

## 2022-05-28 ENCOUNTER — Encounter: Payer: Self-pay | Admitting: Internal Medicine

## 2022-07-03 ENCOUNTER — Ambulatory Visit (INDEPENDENT_AMBULATORY_CARE_PROVIDER_SITE_OTHER): Payer: 59 | Admitting: Internal Medicine

## 2022-07-03 ENCOUNTER — Encounter: Payer: Self-pay | Admitting: Internal Medicine

## 2022-07-03 VITALS — BP 152/92 | HR 64 | Temp 98.0°F | Ht 66.0 in | Wt 168.0 lb

## 2022-07-03 DIAGNOSIS — I5032 Chronic diastolic (congestive) heart failure: Secondary | ICD-10-CM | POA: Diagnosis not present

## 2022-07-03 DIAGNOSIS — I10 Essential (primary) hypertension: Secondary | ICD-10-CM | POA: Diagnosis not present

## 2022-07-03 DIAGNOSIS — I4819 Other persistent atrial fibrillation: Secondary | ICD-10-CM

## 2022-07-03 DIAGNOSIS — R600 Localized edema: Secondary | ICD-10-CM | POA: Diagnosis not present

## 2022-07-03 LAB — D-DIMER, QUANTITATIVE: D-Dimer, Quant: 0.54 mcg/mL FEU — ABNORMAL HIGH (ref <0.50)

## 2022-07-03 LAB — TROPONIN I (HIGH SENSITIVITY): High Sens Troponin I: 9 ng/L (ref 2–17)

## 2022-07-03 LAB — BRAIN NATRIURETIC PEPTIDE: Pro B Natriuretic peptide (BNP): 254 pg/mL — ABNORMAL HIGH (ref 0.0–100.0)

## 2022-07-03 NOTE — Progress Notes (Signed)
Subjective:  Patient ID: Bianca [name], female    DOB: [birth year]  Age: 89 y.o. MRN: [id number]  CC: Hypertension and Atrial Fibrillation   HPI Bianca [name] presents for f/up -  Discussed the use of AI scribe software for clinical note transcription with the patient, who gave verbal consent to proceed.  History of Present Illness   The patient presents with complaints of leg swelling and associated pain, which has been severe enough to limit her mobility. The patient also reports experiencing shortness of breath, particularly during physical activity, which has been a long-standing issue. However, she denies any chest pain or palpitations.  The patient has been on a regimen of diuretics, including furosemide and spironolactone, as well as amlodipine and benazepril for blood pressure control. She reports a history of lower extremity edema, which she attributes to the use of amlodipine. She was advised to stop taking the CCB due to concerns of edema about five months ago. However, the patient has continued to take amlodipine intermittently, based on her blood pressure readings. She is unsure if there is a correlation between the use of amlodipine and the severity of her leg swelling, as the swelling seems to persist regardless of medication use. The patient also notes that her leg swelling tends to worsen in hot and humid weather.  Despite the chronic shortness of breath and leg swelling, the patient denies experiencing any chest pain, lightheadedness, or dizzy spells. She also denies any signs of a blood clot in her lungs.   She does not consistently take the loop diuretic.      Outpatient Medications Prior to Visit  Medication Sig Dispense Refill   apixaban (ELIQUIS) 5 MG TABS tablet TAKE 1 TABLET BY MOUTH TWICE  DAILY 200 tablet 1   calcium-vitamin D (OSCAL WITH D 500-200) 500-200 MG-UNIT per tablet Take 1 tablet by mouth daily.     carboxymethylcellulose (REFRESH PLUS) 0.5 %  SOLN Place 1 drop into both eyes daily as needed (dry eyes).     Cholecalciferol (VITAMIN D-3) 1000 UNITS CAPS Take 1,000 Units by mouth daily.      citalopram (CELEXA) 10 MG tablet Take 1 tablet (10 mg total) by mouth daily. 90 tablet 3   fish oil-omega-3 fatty acids 1000 MG capsule Take 2 g by mouth daily.     furosemide (LASIX) 40 MG tablet Take 1.5 tablets (60 mg total) by mouth daily. (Patient taking differently: Take 40 mg by mouth daily.) 135 tablet 3   gabapentin (NEURONTIN) 100 MG capsule Take 100 mg by mouth 3 (three) times daily.     hydrocortisone cream 1 % Apply 1 application topically 2 (two) times daily. Only on face 30 g 0   latanoprost (XALATAN) 0.005 % ophthalmic solution Place 1 drop into the left eye at bedtime.     MAPAP ARTHRITIS PAIN 650 MG CR tablet TAKE 1 TABLET BY MOUTH  EVERY 8 HOURS AS NEEDED FOR PAIN 120 tablet 0   methocarbamol (ROBAXIN) 500 MG tablet Take 1 tablet (500 mg total) by mouth every 8 (eight) hours as needed for muscle spasms. 10 tablet 0   Multiple Vitamins-Minerals (MULTI FOR HER 50+) TABS Take 1 tablet by mouth daily.     nitroGLYCERIN (NITROSTAT) 0.4 MG SL tablet DISSOLVE 1 TABLET UNDER THE TONGUE EVERY 5 MINUTES AS  NEEDED FOR CHEST PAIN. MAX  OF 3 TABLETS IN 15 MINUTES. CALL 911 IF PAIN PERSISTS. 25 tablet 3   nystatin-triamcinolone (MYCOLOG II) cream Apply  to under breasts and on abdomen twice daily 30 g 0   Specialty Vitamins Products ([employer] HAIR/SKIN/NAILS) TABS Take 1 tablet by mouth daily.     spironolactone (ALDACTONE) 25 MG tablet Take 0.5 tablets (12.5 mg total) by mouth daily. 45 tablet 3   No facility-administered medications prior to visit.    ROS Review of Systems  Constitutional:  Positive for fatigue. Negative for appetite change, chills, diaphoresis and fever.  HENT: Negative.    Eyes: Negative.   Respiratory:  Positive for shortness of breath. Negative for cough, chest tightness, wheezing and stridor.   Cardiovascular:  Positive  for leg swelling. Negative for chest pain and palpitations.  Gastrointestinal:  Negative for abdominal pain, constipation, diarrhea, nausea and vomiting.  Endocrine: Negative.   Genitourinary: Negative.  Negative for difficulty urinating.  Musculoskeletal:  Positive for arthralgias and gait problem.  Skin: Negative.   Neurological:  Negative for dizziness, weakness, light-headedness and numbness.  Hematological:  Negative for adenopathy. Does not bruise/bleed easily.  Psychiatric/Behavioral: Negative.      Objective:  BP (!) 152/92 (BP Location: Right Arm, Patient Position: Sitting, Cuff Size: Large)   Pulse 64   Temp 98 F (36.7 C) (Oral)   Ht 5\' 6"  (1.676 m)   Wt 168 lb (76.2 kg)   SpO2 98%   BMI 27.12 kg/m   BP Readings from Last 3 Encounters:  07/03/22 (!) 152/92  04/22/22 138/86  03/15/22 (!) 84/62    Wt Readings from Last 3 Encounters:  07/03/22 168 lb (76.2 kg)  05/15/22 163 lb (73.9 kg)  04/22/22 162 lb 9.6 oz (73.8 kg)    Physical Exam Vitals reviewed.  Constitutional:      General: She is not in acute distress.    Appearance: Normal appearance. She is ill-appearing. She is not toxic-appearing or diaphoretic.  HENT:     Nose: Nose normal.     Mouth/Throat:     Mouth: Mucous membranes are moist.  Eyes:     General: No scleral icterus.    Conjunctiva/sclera: Conjunctivae normal.  Cardiovascular:     Rate and Rhythm: Normal rate. Rhythm irregularly irregular.     Heart sounds: Murmur heard.     Systolic murmur is present with a grade of 2/6.     No gallop.     Comments: EKG- A fib, 75 bpm Incomplete RBBB, LAD Septal infarct pattern - worse LVH NS ST/T wave changes Pulmonary:     Effort: Pulmonary effort is normal.     Breath sounds: No stridor. No wheezing, rhonchi or rales.  Abdominal:     Palpations: There is no mass.     Tenderness: There is no abdominal tenderness. There is no guarding.     Hernia: No hernia is present.  Musculoskeletal:      Cervical back: Neck supple. No tenderness.     Right lower leg: 1+ Pitting Edema present.     Left lower leg: 1+ Pitting Edema present.  Lymphadenopathy:     Cervical: No cervical adenopathy.  Skin:    General: Skin is warm and dry.  Neurological:     General: No focal deficit present.     Mental Status: She is alert.  Psychiatric:        Mood and Affect: Mood normal.        Behavior: Behavior normal.     Lab Results  Component Value Date   WBC 5.1 02/15/2022   HGB 13.5 02/15/2022   HCT 42.1 02/15/2022  PLT 204.0 02/15/2022   GLUCOSE 109 (H) 03/15/2022   CHOL 168 02/15/2022   TRIG 82.0 02/15/2022   HDL 49.50 02/15/2022   LDLDIRECT 119 (H) 06/14/2009   LDLCALC 102 (H) 02/15/2022   ALT 24 02/15/2022   AST 31 02/15/2022   NA 142 03/15/2022   K 3.8 03/15/2022   CL 103 03/15/2022   CREATININE 0.82 03/15/2022   BUN 23 03/15/2022   CO2 32 03/15/2022   TSH 0.43 03/15/2022   INR 2.8 (H) 01/19/2013   HGBA1C 5.2 02/15/2022    MR ABDOMEN MRCP WO CONTRAST  Result Date: 03/07/2022 CLINICAL DATA:  History of gallbladder/biliary cancer, assess treatment response. EXAM: MRI ABDOMEN WITHOUT CONTRAST  (INCLUDING MRCP) TECHNIQUE: Multiplanar multisequence MR imaging of the abdomen was performed. Heavily T2-weighted images of the biliary and pancreatic ducts were obtained, and three-dimensional MRCP images were rendered by post processing. COMPARISON:  February 26, 2022 FINDINGS: Lower chest: Incidental imaging of the lung bases with limited assessment, unremarkable to the extent evaluated on this abdominal MRI. Hepatobiliary: Abrupt caliber change at the level of the ampulla with suggestion of small amount of sludge in the distal common bile duct. Note that comparison with prior cardiac MRI imaging shows that the bile duct was dilated with grossly similar configuration as far back as February 2020 up to 14 mm showing similar caliber on today's evaluation. Pancreas: Mild atrophy of the  pancreas without ductal dilation, inflammation or visible lesion. Spleen:  Unremarkable Adrenals/Urinary Tract:  Adrenal glands are normal. Renal lesion in the medial upper pole the LEFT kidney is unchanged compared to recent contrasted images. Stomach/Bowel: Unremarkable to the extent evaluated on this abdominal MRI. Vascular/Lymphatic: Unremarkable to the extent evaluated on this noncontrast evaluation. Other:  None Musculoskeletal: No suspicious bone lesions identified. IMPRESSION: 1. Comparison with imaging from February 2020 which was cardiac/vascular imaging of the chest not usually containing images of the biliary tree shows that the biliary tree incidentally imaged on that exam show similar caliber compared to current imaging. 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 Correlate with any symptoms or laboratory evidence of biliary colic/obstruction. Stability is reassuring. Potential enhancement in this area favored to represent volume averaging from adjacent duodenum. 2. Renal lesion in the medial upper pole the LEFT kidney is unchanged compared to recent contrasted images. Electronically Signed   By: Donzetta Kohut M.D.   On: 03/07/2022 10:31    Assessment & Plan:   Persistent atrial fibrillation (HCC)- She has good rate control.  Will continue the DOAC. -     Brain natriuretic peptide; Future -     Troponin I (High Sensitivity); Future -     D-dimer, quantitative; Future  HYPERTENSION, BENIGN ESSENTIAL- Her blood pressure is not at goal.  I have asked her to be more compliant with the loop diuretic.  Chronic diastolic heart failure (HCC)- She has signs of fluid overload.  I recommended that she be more compliant with the loop diuretic.  Bilateral leg edema- EKG is unchanged.  She has an elevated BNP but normal D-dimer and troponin.  I recommended that she be more compliant  with the loop diuretic. -     Brain natriuretic peptide; Future -     Troponin I (High Sensitivity); Future -     D-dimer, quantitative; Future     Follow-up: No follow-ups on file.  Sanda Linger, MD

## 2022-07-03 NOTE — Patient Instructions (Signed)
Peripheral Edema  Peripheral edema is swelling that is caused by a buildup of fluid. Peripheral edema most often affects the lower legs, ankles, and feet. It can also develop in the arms, hands, and face. The area of the body that has peripheral edema will look swollen. It may also feel heavy or warm. Your clothes may start to feel tight. Pressing on the area may make a temporary dent in your skin (pitting edema). You may not be able to move your swollen arm or leg as much as usual. There are many causes of peripheral edema. It can happen because of a complication of other conditions such as heart failure, kidney disease, or a problem with your circulation. It also can be a side effect of certain medicines or happen because of an infection. It often happens to women during pregnancy. Sometimes, the cause is not known. Follow these instructions at home: Managing pain, stiffness, and swelling  Raise (elevate) your legs while you are sitting or lying down. Move around often to prevent stiffness and to reduce swelling. Do not sit or stand for long periods of time. Do not wear tight clothing. Do not wear garters on your upper legs. Exercise your legs to get your circulation going. This helps to move the fluid back into your blood vessels, and it may help the swelling go down. Wear compression stockings as told by your health care provider. These stockings help to prevent blood clots and reduce swelling in your legs. It is important that these are the correct size. These stockings should be prescribed by your doctor to prevent possible injuries. If elastic bandages or wraps are recommended, use them as told by your health care provider. Medicines Take over-the-counter and prescription medicines only as told by your health care provider. Your health care provider may prescribe medicine to help your body get rid of excess water (diuretic). Take this medicine if you are told to take it. General  instructions Eat a low-salt (low-sodium) diet as told by your health care provider. Sometimes, eating less salt may reduce swelling. Pay attention to any changes in your symptoms. Moisturize your skin daily to help prevent skin from cracking and draining. Keep all follow-up visits. This is important. Contact a health care provider if: You have a fever. You have swelling in only one leg. You have increased swelling, redness, or pain in one or both of your legs. You have drainage or sores at the area where you have edema. Get help right away if: You have edema that starts suddenly or is getting worse, especially if you are pregnant or have a medical condition. You develop shortness of breath, especially when you are lying down. You have pain in your chest or abdomen. You feel weak. You feel like you will faint. These symptoms may be an emergency. Get help right away. Call 911. Do not wait to see if the symptoms will go away. Do not drive yourself to the hospital. Summary Peripheral edema is swelling that is caused by a buildup of fluid. Peripheral edema most often affects the lower legs, ankles, and feet. Move around often to prevent stiffness and to reduce swelling. Do not sit or stand for long periods of time. Pay attention to any changes in your symptoms. Contact a health care provider if you have edema that starts suddenly or is getting worse, especially if you are pregnant or have a medical condition. Get help right away if you develop shortness of breath, especially when lying down.   This information is not intended to replace advice given to you by your health care provider. Make sure you discuss any questions you have with your health care provider. Document Revised: 09/04/2020 Document Reviewed: 09/04/2020 Elsevier Patient Education  2024 [employer]  

## 2022-08-16 ENCOUNTER — Encounter (HOSPITAL_COMMUNITY): Payer: Self-pay | Admitting: Emergency Medicine

## 2022-08-16 ENCOUNTER — Ambulatory Visit (HOSPITAL_COMMUNITY)
Admission: EM | Admit: 2022-08-16 | Discharge: 2022-08-16 | Disposition: A | Discharge status: Home / Self Care | Payer: 59

## 2022-08-16 ENCOUNTER — Other Ambulatory Visit: Payer: Self-pay

## 2022-08-16 ENCOUNTER — Encounter (HOSPITAL_COMMUNITY): Payer: Self-pay

## 2022-08-16 ENCOUNTER — Telehealth: Payer: Self-pay | Admitting: Interventional Cardiology

## 2022-08-16 ENCOUNTER — Emergency Department (HOSPITAL_COMMUNITY)
Admission: EM | Admit: 2022-08-16 | Discharge: 2022-08-16 | Disposition: A | Discharge status: Home / Self Care | Payer: 59 | Attending: Emergency Medicine | Admitting: Emergency Medicine

## 2022-08-16 ENCOUNTER — Emergency Department (HOSPITAL_COMMUNITY): Payer: 59

## 2022-08-16 DIAGNOSIS — R202 Paresthesia of skin: Secondary | ICD-10-CM | POA: Diagnosis not present

## 2022-08-16 DIAGNOSIS — Z8673 Personal history of transient ischemic attack (TIA), and cerebral infarction without residual deficits: Secondary | ICD-10-CM | POA: Diagnosis not present

## 2022-08-16 DIAGNOSIS — I509 Heart failure, unspecified: Secondary | ICD-10-CM | POA: Insufficient documentation

## 2022-08-16 DIAGNOSIS — R1084 Generalized abdominal pain: Secondary | ICD-10-CM | POA: Insufficient documentation

## 2022-08-16 DIAGNOSIS — Z7901 Long term (current) use of anticoagulants: Secondary | ICD-10-CM | POA: Insufficient documentation

## 2022-08-16 DIAGNOSIS — R079 Chest pain, unspecified: Secondary | ICD-10-CM | POA: Diagnosis not present

## 2022-08-16 LAB — CBC WITH DIFFERENTIAL/PLATELET
Abs Immature Granulocytes: 0.01 10*3/uL (ref 0.00–0.07)
Basophils Absolute: 0 10*3/uL (ref 0.0–0.1)
Basophils Relative: 0 %
Eosinophils Absolute: 0 10*3/uL (ref 0.0–0.5)
Eosinophils Relative: 1 %
HCT: 42.1 % (ref 36.0–46.0)
Hemoglobin: 13.8 g/dL (ref 12.0–15.0)
Immature Granulocytes: 0 %
Lymphocytes Relative: 40 %
Lymphs Abs: 2 10*3/uL (ref 0.7–4.0)
MCH: 30.5 pg (ref 26.0–34.0)
MCHC: 32.8 g/dL (ref 30.0–36.0)
MCV: 92.9 fL (ref 80.0–100.0)
Monocytes Absolute: 0.5 10*3/uL (ref 0.1–1.0)
Monocytes Relative: 10 %
Neutro Abs: 2.5 10*3/uL (ref 1.7–7.7)
Neutrophils Relative %: 49 %
Platelets: 209 10*3/uL (ref 150–400)
RBC: 4.53 MIL/uL (ref 3.87–5.11)
RDW: 13.7 % (ref 11.5–15.5)
WBC: 4.9 10*3/uL (ref 4.0–10.5)
nRBC: 0 % (ref 0.0–0.2)

## 2022-08-16 LAB — COMPREHENSIVE METABOLIC PANEL
ALT: 25 U/L (ref 0–44)
AST: 21 U/L (ref 15–41)
Albumin: 4.2 g/dL (ref 3.5–5.0)
Alkaline Phosphatase: 72 U/L (ref 38–126)
Anion gap: 9 (ref 5–15)
BUN: 14 mg/dL (ref 8–23)
CO2: 28 mmol/L (ref 22–32)
Calcium: 9.8 mg/dL (ref 8.9–10.3)
Chloride: 103 mmol/L (ref 98–111)
Creatinine, Ser: 0.53 mg/dL (ref 0.44–1.00)
GFR, Estimated: >60 mL/min (ref >60)
Glucose, Bld: 108 mg/dL — ABNORMAL HIGH (ref 70–99)
Potassium: 4.2 mmol/L (ref 3.5–5.1)
Sodium: 140 mmol/L (ref 135–145)
Total Bilirubin: 1.3 mg/dL — ABNORMAL HIGH (ref 0.3–1.2)
Total Protein: 7.2 g/dL (ref 6.5–8.1)

## 2022-08-16 LAB — TROPONIN I (HIGH SENSITIVITY)
Troponin I (High Sensitivity): 7 ng/L (ref <18)
Troponin I (High Sensitivity): 5 ng/L (ref <18)

## 2022-08-16 LAB — BRAIN NATRIURETIC PEPTIDE: B Natriuretic Peptide: 183.7 pg/mL — ABNORMAL HIGH (ref 0.0–100.0)

## 2022-08-16 MED ORDER — IOHEXOL 350 MG/ML SOLN
100.0000 mL | Freq: Once | INTRAVENOUS | Status: AC | PRN
  Administered 2022-08-16: 100 mL via INTRAVENOUS

## 2022-08-16 NOTE — ED Notes (Signed)
Patient is being discharged from the Urgent Care and sent to the Emergency Department via private vehicle . Per Dr. Marlinda Mike, patient is in need of higher level of care due to several episodes of having to use her nitroglycerin this week, with left arm numbness, and complicated history. Patient (and daughter) is aware and verbalizes understanding of plan of care.  Vitals:   08/16/22 1129  BP: (!) 159/106  Pulse: 93  Resp: 18  Temp: 98 F (36.7 C)  SpO2: 100%

## 2022-08-16 NOTE — Telephone Encounter (Signed)
Pt c/o swelling: STAT is pt has developed SOB within 24 hours  If swelling, where is the swelling located? Top of the feet and left leg   How much weight have you gained and in what time span? No  Have you gained 3 pounds in a day or 5 pounds in a week? No  Do you have a log of your daily weights (if so, list)? No  Are you currently taking a fluid pill? Yes   Are you currently SOB? Yes   Have you traveled recently? No

## 2022-08-16 NOTE — Telephone Encounter (Signed)
Daughter called patient who gave permission for me to talk with her.  Spoke with daughter who reports patient has swelling in her left leg.  Got injection in leg last week from orthopedics.  Daughter reports patient complains of numbness in her left arm which started today.  No chest pain.  Chronic shortness of breath which has not worsened.  Is taking her medications.  Daughter put patient on speaker phone.  Patient complains of numbness and weakness in entire left arm.  Started this morning.  Patient feels she needs her heart checked.  I told patient due to her symptoms she should be evaluated urgently and to call 911.

## 2022-08-16 NOTE — ED Provider Notes (Signed)
[place] EMERGENCY DEPARTMENT AT [facility] Provider Note   CSN: [id number] Arrival date & time: 08/16/22  1159     History  Chief Complaint  Patient presents with   Chest Pain   Numbness         Bianca [name] is a 89 y.o. female,  Aneurysm, CHF, A-fib, who presents to the ED secondary to 2 different complaints.  She states that she has had left arm numbness, has been going on for the last week.  She states it started Monday and has been persistent.  She finds some relief when she raises her arm to do her exercises, but states that it is pretty persistent.  She has had something like this before, and states that her neurologist that it was a pinched nerve.  She denies any kind of difficulty with speech, difficulty swallowing, weakness on one side of the body, or facial droop.  No dizziness  Also here for chest pain and abdominal pain, this has been going on for the last couple days.  She is taken nitroglycerin twice this week which has helped with both the abdominal and chest pain.  She states that the arm pain came on differently than the chest pain and abdominal pain.  She notes that it is pressurized in nature, and last for about 10 minutes prior to taking nitroglycerin.     Home Medications Prior to Admission medications   Medication Sig Start Date End Date Taking? Authorizing Provider  apixaban (ELIQUIS) 5 MG TABS tablet TAKE 1 TABLET BY MOUTH TWICE  DAILY 02/28/22   Corky Crafts, MD  calcium-vitamin D (OSCAL WITH D 500-200) 500-200 MG-UNIT per tablet Take 1 tablet by mouth daily.    [provider]  carboxymethylcellulose (REFRESH PLUS) 0.5 % SOLN Place 1 drop into both eyes daily as needed (dry eyes).    [provider]  Cholecalciferol (VITAMIN D-3) 1000 UNITS CAPS Take 1,000 Units by mouth daily.     [provider]  citalopram (CELEXA) 10 MG tablet Take 1 tablet (10 mg total) by mouth daily. 02/18/22 02/18/23  Corwin Levins,  MD  fish oil-omega-3 fatty acids 1000 MG capsule Take 2 g by mouth daily.    [provider]  furosemide (LASIX) 40 MG tablet Take 1.5 tablets (60 mg total) by mouth daily. Patient taking differently: Take 40 mg by mouth daily. 09/24/21   Dameron, Nolberto Hanlon, DO  gabapentin (NEURONTIN) 100 MG capsule Take 100 mg by mouth 3 (three) times daily. 12/04/21   [provider]  hydrocortisone cream 1 % Apply 1 application topically 2 (two) times daily. Only on face 04/08/19   Oralia Manis, DO  latanoprost (XALATAN) 0.005 % ophthalmic solution Place 1 drop into the left eye at bedtime. 06/15/21   [provider]  MAPAP ARTHRITIS PAIN 650 MG CR tablet TAKE 1 TABLET BY MOUTH  EVERY 8 HOURS AS NEEDED FOR PAIN 07/31/17   Leland Her, DO  methocarbamol (ROBAXIN) 500 MG tablet Take 1 tablet (500 mg total) by mouth every 8 (eight) hours as needed for muscle spasms. 10/25/21   Charlynne Pander, MD  Multiple Vitamins-Minerals (MULTI FOR HER 50+) TABS Take 1 tablet by mouth daily.    [provider]  nitroGLYCERIN (NITROSTAT) 0.4 MG SL tablet DISSOLVE 1 TABLET UNDER THE TONGUE EVERY 5 MINUTES AS  NEEDED FOR CHEST PAIN. MAX  OF 3 TABLETS IN 15 MINUTES. CALL 911 IF PAIN PERSISTS. 05/16/21   Eldridge Dace,  Donnie Coffin, MD  nystatin-triamcinolone (MYCOLOG II) cream Apply to under breasts and on abdomen twice daily 03/18/19   Cathleen Corti, MD  Specialty Vitamins Products ([employer] HAIR/SKIN/NAILS) TABS Take 1 tablet by mouth daily.    [provider]  spironolactone (ALDACTONE) 25 MG tablet Take 0.5 tablets (12.5 mg total) by mouth daily. 10/05/21   Dameron, Nolberto Hanlon, DO      Allergies    Hydrocodone, Morphine, and Penicillins    Review of Systems   Review of Systems  Cardiovascular:  Positive for chest pain.    Physical Exam Updated Vital Signs BP 130/74   Pulse 76   Temp 98 F (36.7 C)   Resp 17   Ht 5\' 5"  (1.651 m)   Wt 74.4 kg   SpO2 100%   BMI 27.29 kg/m  Physical  Exam Vitals and nursing note reviewed.  Constitutional:      General: She is not in acute distress.    Appearance: She is well-developed.  HENT:     Head: Normocephalic and atraumatic.  Eyes:     Conjunctiva/sclera: Conjunctivae normal.  Cardiovascular:     Rate and Rhythm: Normal rate and regular rhythm.     Heart sounds: No murmur heard. Pulmonary:     Effort: Pulmonary effort is normal. No respiratory distress.     Breath sounds: Normal breath sounds.  Abdominal:     Palpations: Abdomen is soft.     Tenderness: There is no abdominal tenderness.  Musculoskeletal:        General: No swelling.     Cervical back: Neck supple.     Right lower leg: Edema present.     Left lower leg: Edema present.     Comments: 2+ pitting edema of BLE  Skin:    General: Skin is warm and dry.     Capillary Refill: Capillary refill takes less than 2 seconds.  Neurological:     Mental Status: She is alert.     Cranial Nerves: Cranial nerves 2-12 are intact.     Sensory: Sensation is intact.     Motor: Motor function is intact.     Comments: No perceived deficit, 5 out of 5 strength of BUE and BLE. No facial droop.   Psychiatric:        Mood and Affect: Mood normal.     ED Results / Procedures / Treatments   Labs (all labs ordered are listed, but only abnormal results are displayed) Labs Reviewed  COMPREHENSIVE METABOLIC PANEL - Abnormal; Notable for the following components:      Result Value   Glucose, Bld 108 (*)    Total Bilirubin 1.3 (*)    All other components within normal limits  BRAIN NATRIURETIC PEPTIDE - Abnormal; Notable for the following components:   B Natriuretic Peptide 183.7 (*)    All other components within normal limits  CBC WITH DIFFERENTIAL/PLATELET  TROPONIN I (HIGH SENSITIVITY)  TROPONIN I (HIGH SENSITIVITY)    EKG EKG Interpretation Date/Time:  Friday August 16 2022 12:12:00 EDT Ventricular Rate:  81 PR Interval:  163 QRS Duration:  116 QT  Interval:  410 QTC Calculation: 476 R Axis:   -57  Text Interpretation: Atrial fibrillation LAD, consider left anterior fascicular block LVH with secondary repolarization abnormality Confirmed by Linwood Dibbles [phone]) on 08/16/2022 12:14:46 PM  Radiology CT Head Wo Contrast  Result Date: 08/16/2022 CLINICAL DATA:  Transient ischemic attack (TIA) EXAM: CT HEAD WITHOUT CONTRAST TECHNIQUE: Contiguous axial images were obtained  from the base of the skull through the vertex without intravenous contrast. RADIATION DOSE REDUCTION: This exam was performed according to the departmental dose-optimization program which includes automated exposure control, adjustment of the mA and/or kV according to patient size and/or use of iterative reconstruction technique. COMPARISON:  MRI October 25, 2021. FINDINGS: Brain:  remote bilateral cerebellar infarcts. Patchy white matter hypodensities, nonspecific but compatible with chronic microvascular ischemic change. No evidence of acute large vascular territory infarct, acute hemorrhage, mass lesion, midline shift or hydrocephalus. Vascular: No hyperdense vessel identified. Calcific atherosclerosis. Skull: No acute fracture. Sinuses/Orbits: Clear sinuses.  No acute orbital findings. Other: No mastoid effusions. IMPRESSION: No evidence of acute intracranial abnormality. Electronically Signed   By: Feliberto Harts M.D.   On: 08/16/2022 15:28   CT Angio Chest/Abd/Pel for Dissection W and/or Wo Contrast  Result Date: 08/16/2022 CLINICAL DATA:  Aortic aneurysm suspected. EXAM: CT ANGIOGRAPHY CHEST, ABDOMEN AND PELVIS TECHNIQUE: Noncontrast chest CT was performed. Multidetector CT imaging through the chest, abdomen and pelvis was performed using the standard protocol during bolus administration of intravenous contrast. Multiplanar reconstructed images and MIPs were obtained and reviewed to evaluate the vascular anatomy. RADIATION DOSE REDUCTION: This exam was performed according to  the departmental dose-optimization program which includes automated exposure control, adjustment of the mA and/or kV according to patient size and/or use of iterative reconstruction technique. CONTRAST:  OMNIPAQUE IOHEXOL 350 MG/ML SOLN COMPARISON:  CT abdomen and pelvis 10/25/2021. MRA of the chest 10/29/2018. FINDINGS: CTA CHEST FINDINGS Cardiovascular: There is adequate opacification of the aorta. There is ascending aortic aneurysm measuring 4.6 cm which is unchanged. There is no evidence for dissection. There is mild calcified atherosclerotic disease of the aorta. The heart is enlarged similar to the prior study. There is no pericardial effusion. There is no central pulmonary embolism. The main pulmonary artery is enlarged compatible with pulmonary artery hypertension similar to prior. Origin of the great vessels appears within normal limits. There is likely moderate severe focal stenosis of the origin of the right vertebral artery secondary to calcified atherosclerotic disease. Mediastinum/Nodes: No enlarged mediastinal, hilar, or axillary lymph nodes. There are few scattered calcified mediastinal lymph nodes. The right thyroid gland is diffusely heterogeneous and moderately enlarged. Visualized esophagus is within normal limits. Lungs/Pleura: There is minimal scarring in the left upper lobe. There is no focal lung infiltrate, pleural effusion or pneumothorax. There are few scattered nodular densities 1-2 mm. Musculoskeletal: No acute osseous findings. There is T2 vertebral body hemangioma. Review of the MIP images confirms the above findings. CTA ABDOMEN AND PELVIS FINDINGS VASCULAR Aorta: Normal caliber aorta without aneurysm, dissection, vasculitis or significant stenosis. There is moderate calcified atherosclerotic disease throughout the aorta. Celiac: There is a focal aneurysm projecting anteriorly from the proximal proper hepatic artery measuring 7 by 6 by 7 mm. Otherwise, celiac artery and branches  appear within normal limits. SMA: Patent without evidence of aneurysm, dissection, vasculitis or significant stenosis. Renals: Both renal arteries are patent without evidence of aneurysm, dissection, vasculitis, fibromuscular dysplasia or significant stenosis. IMA: Patent without evidence of aneurysm, dissection, vasculitis or significant stenosis. Inflow: Patent without evidence of aneurysm, dissection, vasculitis or significant stenosis. Veins: No obvious venous abnormality within the limitations of this arterial phase study. Review of the MIP images confirms the above findings. NON-VASCULAR Hepatobiliary: The gallbladder is surgically absent. There is intra and extrahepatic biliary ductal dilatation with common bile duct measuring up to 13 mm, unchanged from prior. No focal liver lesions are seen. Pancreas:  Unremarkable. No pancreatic ductal dilatation or surrounding inflammatory changes. Spleen: Normal in size without focal abnormality. Adrenals/Urinary Tract: There are rounded cortical hypodensities in the left kidney favored as cysts measuring up to 2 cm. No hydronephrosis or perinephric fluid. Bladder and adrenal glands are within normal limits. Stomach/Bowel: Stomach is within normal limits. Appendix appears normal. No evidence of bowel wall thickening, distention, or inflammatory changes. There is scattered colonic diverticulosis. There is a large amount of stool throughout the colon. Lymphatic: No enlarged lymph nodes are seen. Reproductive: Status post hysterectomy. No adnexal masses. Other: No abdominal wall hernia or abnormality. No abdominopelvic ascites. Musculoskeletal: The bones are osteopenic. There are few scattered sclerotic foci measuring less than 5 mm, unchanged from prior Review of the MIP images confirms the above findings. IMPRESSION: VASCULAR: 1. No evidence for aortic dissection or aneurysm. 2. Stable ascending thoracic aortic aneurysm measuring 4.6 cm. Ascending thoracic aortic aneurysm.  Recommend semi-annual imaging followup by CTA or MRA and referral to cardiothoracic surgery if not already obtained. This recommendation follows 2010 ACCF/AHA/AATS/ACR/ASA/SCA/SCAI/SIR/STS/SVM Guidelines for the Diagnosis and Management of Patients With Thoracic Aortic Disease. Circulation. 2010; 121: X914-N829. Aortic aneurysm NOS (ICD10-I71.9) 3. Stable cardiomegaly. 4. Findings compatible with pulmonary artery hypertension. 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 NONVASCULAR: 1. Stable intra and extrahepatic biliary ductal dilatation likely related to patient's age and prior cholecystectomy. 2. Colonic diverticulosis. 3. Incidental heterogeneous and enlarged thyroid. Recommend non-emergent thyroid ultrasound if clinically warranted given patient age. Reference: J Am Coll Radiol. 2015 Feb;12(2): 143-50 4. Multiple pulmonary nodules. Most significant: Solid pulmonary nodule measuring 2 mm. Per Fleischner Society Guidelines, no routine follow-up imaging is recommended. These guidelines do not apply to immunocompromised patients and patients with cancer. Follow up in patients with significant comorbidities as clinically warranted. For lung cancer screening, adhere to Lung-RADS guidelines. Reference: Radiology. 2017; 284(1):228-43. 5. Left Bosniak I benign renal cyst measuring 2 cm. No follow-up imaging is recommended. JACR 2018 Feb; 264-273, Management of the Incidental Renal Mass on CT, [employer] 2021; 814-848, Bosniak Classification of Cystic Renal Masses, Version 2019. Aortic Atherosclerosis (ICD10-I70.0). Electronically Signed   By: Darliss Cheney M.D.   On: 08/16/2022 15:26    Procedures Procedures    Medications Ordered in ED Medications  iohexol (OMNIPAQUE) 350 MG/ML injection 100 mL (100 mLs Intravenous Contrast Given 08/16/22 1427)    ED Course/ Medical Decision Making/ A&P             HEART Score: 6                    Medical Decision Making Patient is a 89 year old female,  here for multiple complaints including chest pain, and abdominal pain, relieved by nitroglycerin.  Will obtain troponins, CTA dissection study, given the left arm numbness.  Will also obtain basic labs, for further evaluation.  I asked Dr. Earlene Plater to evaluate the patient, appreciate his recommendations.  Amount and/or Complexity of Data Reviewed Labs: ordered.    Details: Troponin x 2 within normal limits, no electrolyte dysfunction Radiology: ordered.    Details: CTA dissection study, shows no evidence of dissection, does have stable aneurysm, that is already known about.  CT head, shows no acute abnormalities.  Found to have some incidental thyroid enlargement, and pulmonary nodules. Discussion of management or test interpretation with external provider(s): Spoke with cardiology on call, he recommends no further management.  Follow-up with primary care doctor.  Discussed with patient, CTA dissection study was reassuring, she is  already doing monitoring for her aneurysm with cardiothoracic, additionally she should follow-up with a cardiologist.  And PCP regarding her incidental findings and her ER visit.  I believe that her numbness of the left arm, is likely secondary to a pinched nerve, she states she has had similar before, and it is felt very similar.  She has no neurodeficits on exam, is overall well-appearing.  Head CT is unremarkable.  No evidence of CVA, and head CT, and it has been a week or so since her symptoms started.  Discussed with Dr. Earlene Plater, discharged home with strict return precautions.  Risk Prescription drug management.   Final Clinical Impression(s) / ED Diagnoses Final diagnoses:  Chest pain, unspecified type  Generalized abdominal pain  Arm paresthesia, left    Rx / DC Orders ED Discharge Orders     None         Pete Pelt, PA 08/16/22 1727    Laurence Spates, MD 08/22/22 [phone]

## 2022-08-16 NOTE — Discharge Instructions (Addendum)
Please follow-up with your primary care doctor, make sure you are taking all your medications as directed.  Return to the ER if you feel like your symptoms are worsening.  Believe that your left arm numbness is likely secondary to pinched nerve, your CTA was reassuring today, and he should follow-up with your primary care doctor, for PT/OT outpatient.  Additionally we did find incidental findings of send that pulmonary nodules, and enlarged thyroid please follow-up with your primary care doctor for this.  Your chest pain has been reassuring, however please follow-up with your cardiologist for further care.

## 2022-08-16 NOTE — ED Notes (Signed)
Dr. Marlinda Mike aware of patient and at bedside at this time

## 2022-08-16 NOTE — ED Provider Notes (Incomplete)
89 year old female history of thoracic aneurysm, CHF, A-fib on anticoagulation presenting for left arm numbness and chest pain.  The left arm numbness is recurrent.  She had an episode in October which improved with therapy and was attributed to a pinched nerve.  Since Monday she has had some intermittent numbness of the left arm which is similar to this.  Currently she is asymptomatic.  No weakness, no injury or skin changes.  On exam she has normal sensation and strength, 2+ radial pulse.  No skin changes or tenderness.  She also notes an episode of chest pain this morning which improved with nitro.  It was in her epigastrium and felt like a burning sensation.  Has had similar pain before.  She is high risk, given neurologic symptoms with chest discomfort will get CTA dissection study.  I have low suspicion for acute stroke at this time based on her neurologic exam.  Suspect peripheral nerve cause of her earlier numbness.  EKG without acute ischemia.  Plan for dissection study, CT head, lab workup and discussed with cardiology.

## 2022-08-16 NOTE — ED Provider Notes (Signed)
Patient seen briefly in triage.  She notes that about twice this week she has used nitroglycerin.  She would have some discomfort and heaviness in her abdomen that would then move into her chest.  The nitroglycerin did help how she felt in those moments.  She is also had left arm numbness for about 5 days.  No motor weakness noted.  On exam her vital signs are reassuring except for a blood pressure of 159/106.  There is no facial droop and no arm drift.  Grip is strong in both hands.  There is decreased light touch sensation of the left arm.  Her daughter is driving, and I have asked him to proceed by private car for the patient to be seen in the emergency room where she can have urgent evaluation that we cannot accomplish for her here in the urgent care.   Zenia Resides, MD 08/16/22 1140

## 2022-08-16 NOTE — ED Triage Notes (Addendum)
Pt coming from urgent care complaining of abd pain that refers up to her chest. Pt also complaining of left arm numbness. Both of these complaints started around Monday. Pt has taken nitroglycerin twice this week, and says it provided relief. Questionable droop on left side of mouth, no other neuro deficits found. Hx of cardiac problems.

## 2022-08-16 NOTE — ED Triage Notes (Signed)
Patient presents to [facility] for evaluation after having to use her nitroglycerin twice this week for abdominal/chest pain that was helped.  Patient also c/o left arm numbness for a few days, states it is dulled to the touch, but can feel it.  No neuro deficits in triage other than the dulled sensation to the arm

## 2022-10-01 ENCOUNTER — Other Ambulatory Visit: Payer: Self-pay | Admitting: Interventional Cardiology

## 2022-10-01 DIAGNOSIS — R0602 Shortness of breath: Secondary | ICD-10-CM

## 2022-10-01 DIAGNOSIS — I4819 Other persistent atrial fibrillation: Secondary | ICD-10-CM

## 2022-10-01 DIAGNOSIS — I5032 Chronic diastolic (congestive) heart failure: Secondary | ICD-10-CM

## 2022-10-01 DIAGNOSIS — I10 Essential (primary) hypertension: Secondary | ICD-10-CM

## 2022-10-01 MED ORDER — SPIRONOLACTONE 25 MG PO TABS
12.5000 mg | ORAL_TABLET | Freq: Every day | ORAL | 2 refills | Status: DC
Start: 2022-10-01 — End: 2023-03-19

## 2022-10-01 MED ORDER — APIXABAN 5 MG PO TABS
5.0000 mg | ORAL_TABLET | Freq: Two times a day (BID) | ORAL | 1 refills | Status: DC
Start: 2022-10-01 — End: 2023-03-19

## 2022-10-01 NOTE — Telephone Encounter (Signed)
Prescription refill request for Eliquis received. Indication: afib  Last office visit: [place], 04/22/2022 Scr: 0.53, 08/16/2022 Age: 89 yo  Weight: 74.4 kg   Refill sent.

## 2022-10-01 NOTE — Telephone Encounter (Signed)
*  STAT* If patient is at the pharmacy, call can be transferred to refill team.   1. Which medications need to be refilled? (please list name of each medication and dose if known)   apixaban (ELIQUIS) 5 MG TABS tablet    spironolactone (ALDACTONE) 25 MG tablet    2. Which pharmacy/location (including street and city if local pharmacy) is medication to be sent to?  [facility] - [place], [place] - [address]      3. Do they need a 30 day or 90 day supply? 90 day    Pt is completely out of medication

## 2022-10-01 NOTE — Telephone Encounter (Signed)
Pt's medication was sent to pt's pharmacy as requested. Confirmation received.  °

## 2022-10-14 ENCOUNTER — Other Ambulatory Visit: Payer: Self-pay | Admitting: Internal Medicine

## 2022-10-14 ENCOUNTER — Other Ambulatory Visit: Payer: Self-pay | Admitting: Student

## 2022-10-14 DIAGNOSIS — Z1231 Encounter for screening mammogram for malignant neoplasm of breast: Secondary | ICD-10-CM

## 2022-11-20 ENCOUNTER — Ambulatory Visit
Admission: RE | Admit: 2022-11-20 | Discharge: 2022-11-20 | Disposition: A | Discharge status: Home / Self Care | Payer: 59 | Source: Ambulatory Visit | Attending: Internal Medicine | Admitting: Internal Medicine

## 2022-11-20 DIAGNOSIS — Z1231 Encounter for screening mammogram for malignant neoplasm of breast: Secondary | ICD-10-CM

## 2022-12-19 NOTE — Progress Notes (Signed)
Cardiology Office Note:  .   Date:  12/20/2022  ID:  Bianca [name], DOB 09/24/1930, MRN [id number] PCP: Bianca Grandchild, MD  [name] Providers Cardiologist:  Bianca Muss, MD {   History of Present Illness: Bianca [name]   Bianca [name] is a 89 y.o. female who has a past medical history of permanent atrial fibrillation and chronic diastolic heart failure here for follow-up appointment.  Echocardiogram showed mild concentric hypertrophy and normal wall motion.  Ascending aorta was moderately dilated measuring 47 mm.  There was mild mitral regurgitation.  Left atrium was severely dilated.  Right ventricle was mildly dilated with normal wall thickness.  Right atrium moderately dilated.  Mild TR and PR.  Systolic pressure of the pulmonary artery was mildly increased.  PA peak pressure 30 mmHg.  MRA 1/18 showed aneurysmal dilation of the ascending thoracic aorta measuring up to 4.7 cm in maximal diameter.  Aortic root/sinus of Valsalva did not show aneurysmal dilatation.  History of leg swelling in the past and had been taking daily Lasix although it was prescribed twice a day.  In 12/18 she had some chest discomfort and started in the epigastric region and went up to her chest.  She drank some soda and that helped.  She ate some sausage that she would not normally eat that evening.  No further recurrence.  May 2020, BP check at home meds were not changed given her advanced age and history bradycardia plans were to hold course.  When she was seen April 2024 she reported fatigue showing overall normal LV/RV.  Today, she presents with a history of an enlarged aorta and overactive thyroid for a routine follow-up. The patient was last seen in April of the previous year for fatigue. An echocardiogram at that time showed normal functioning of the right and left ventricles. A CT scan showed an enlargement of the aorta, but no dissection was found. The patient is due for another CT scan in  August of the following year to monitor the aorta.  The patient reports issues with fluid, particularly in the heat of the summer, which is managed with 60mg  of Lasix daily. The patient reports that this dose has been mostly effective in managing fluid levels.  The patient has also experienced significant weight loss, dropping from over 200lbs to 164lbs. This weight loss has led to issues with blood pressure management. The patient was previously on amlodipine benazepril, but stopped taking it due to concerns about it lowering her blood pressure too much. The patient checks her blood pressure daily and takes the medication as needed.  The patient also has a history of overactive thyroid, which was treated with a medication imported from [place]. The patient reports that this treatment was effective and she no longer experiences symptoms related to the overactive thyroid  Discussed the use of AI scribe software for clinical note transcription with the patient, who gave verbal consent to proceed.  Reports no shortness of breath nor dyspnea on exertion. Reports no chest pain, pressure, or tightness. No edema, orthopnea, PND. Reports no palpitations.   ROS: Pertinent ROS in HPI  Studies Reviewed: .       CT angio 08/2022 IMPRESSION: VASCULAR:   1. No evidence for aortic dissection or aneurysm. 2. Stable ascending thoracic aortic aneurysm measuring 4.6 cm. Ascending thoracic aortic aneurysm. Recommend semi-annual imaging followup by CTA or MRA and referral to cardiothoracic surgery if not already obtained. This recommendation follows 2010 ACCF/AHA/AATS/ACR/ASA/SCA/SCAI/SIR/STS/SVM Guidelines for the Diagnosis  and Management of Patients With Thoracic Aortic Disease. Circulation. 2010; 121: V425-Z563. Aortic aneurysm NOS (ICD10-I71.9) 3. Stable cardiomegaly. 4. Findings compatible with pulmonary artery hypertension. 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   NONVASCULAR:   1.  Stable intra and extrahepatic biliary ductal dilatation likely related to patient's age and prior cholecystectomy. 2. Colonic diverticulosis. 3. Incidental heterogeneous and enlarged thyroid. Recommend non-emergent thyroid ultrasound if clinically warranted given patient age. Reference: J Am Coll Radiol. 2015 Feb;12(2): 143-50 4. Multiple pulmonary nodules. Most significant: Solid pulmonary nodule measuring 2 mm. Per Fleischner Society Guidelines, no routine follow-up imaging is recommended. These guidelines do not apply to immunocompromised patients and patients with cancer. Follow up in patients with significant comorbidities as clinically warranted. For lung cancer screening, adhere to Lung-RADS guidelines. Reference: Radiology. 2017; 284(1):228-43. 5. Left Bosniak I benign renal cyst measuring 2 cm. No follow-up imaging is recommended. JACR 2018 Feb; 264-273, Management of the Incidental Renal Mass on CT, RadioGraphics 2021; 814-848, Bosniak Classification of Cystic Renal Masses, Version 2019.   Aortic Atherosclerosis (ICD10-I70.0). Physical Exam:   VS:  BP 120/88 (BP Location: Left Arm, Patient Position: Sitting, Cuff Size: Normal)   Pulse 75   Ht 5\' 6"  (1.676 m)   Wt 165 lb 3.2 oz (74.9 kg)   SpO2 98%   BMI 26.66 kg/m    Wt Readings from Last 3 Encounters:  12/20/22 165 lb 3.2 oz (74.9 kg)  08/16/22 164 lb (74.4 kg)  07/03/22 168 lb (76.2 kg)    GEN: Well nourished, well developed in no acute distress NECK: No JVD; No carotid bruits CARDIAC: IRIR, no murmurs, rubs, gallops RESPIRATORY:  Clear to auscultation without rales, wheezing or rhonchi  ABDOMEN: Soft, non-tender, non-distended EXTREMITIES:  No edema; No deformity   ASSESSMENT AND PLAN: .        Aortic Aneurysm Stable aortic enlargement at 4.6 cm. No dissection noted. -Schedule follow-up CT scan in August 2025 to monitor for any changes in size.  Fluid Retention Seasonal fluid retention, worse in the  summer. Currently managed with Lasix 60mg  daily. -Continue Lasix 60mg  daily.  Hypertension Significant weight loss leading to hypotension with current dose of Amlodipine/Benazepril. Patient self-adjusting dose as needed. -Reduce Amlodipine/Benazepril to 5mg /10mg  daily. -Check blood pressure regularly to monitor response to new dose.  Atrial Fibrillation Chronic condition, currently managed with Eliquis 2x daily. -Continue Eliquis 2x daily.  Hyperthyroidism History of overactive thyroid managed with medication. -No current plan needed, continue current management.  Follow-up -Schedule follow-up appointment in 6 months with new provider. -Check cholesterol at next appointment with Dr. Yetta Barre.   Signed, Sharlene Dory, PA-C

## 2022-12-20 ENCOUNTER — Ambulatory Visit: Payer: 59 | Attending: Physician Assistant | Admitting: Physician Assistant

## 2022-12-20 ENCOUNTER — Encounter: Payer: Self-pay | Admitting: Physician Assistant

## 2022-12-20 VITALS — BP 120/88 | HR 75 | Ht 66.0 in | Wt 165.2 lb

## 2022-12-20 DIAGNOSIS — I5032 Chronic diastolic (congestive) heart failure: Secondary | ICD-10-CM

## 2022-12-20 DIAGNOSIS — I4821 Permanent atrial fibrillation: Secondary | ICD-10-CM | POA: Diagnosis not present

## 2022-12-20 DIAGNOSIS — I110 Hypertensive heart disease with heart failure: Secondary | ICD-10-CM | POA: Diagnosis not present

## 2022-12-20 DIAGNOSIS — I7121 Aneurysm of the ascending aorta, without rupture: Secondary | ICD-10-CM

## 2022-12-20 MED ORDER — AMLODIPINE BESY-BENAZEPRIL HCL 5-10 MG PO CAPS: 1.0000 | ORAL_CAPSULE | Freq: Every day | ORAL | 3 refills | Status: DC

## 2022-12-20 NOTE — Patient Instructions (Addendum)
Medication Instructions:  Decrease Amlodipine-Benazepril to 5-10 MG daily   *If you need a refill on your cardiac medications before your next appointment, please call your pharmacy*   Lab Work: None ordered   If you have labs (blood work) drawn today and your tests are completely normal, you will receive your results only by: MyChart Message (if you have MyChart) OR A paper copy in the mail If you have any lab test that is abnormal or we need to change your treatment, we will call you to review the results.   Testing/Procedures: None ordered   Follow-Up: At [facility], you and your health needs are our priority.  As part of our continuing mission to provide you with exceptional heart care, we have created designated Provider Care Teams.  These Care Teams include your primary Cardiologist (physician) and Advanced Practice Providers (APPs -  Physician Assistants and Nurse Practitioners) who all work together to provide you with the care you need, when you need it.  We recommend signing up for the patient portal called "MyChart".  Sign up information is provided on this After Visit Summary.  MyChart is used to connect with patients for Virtual Visits (Telemedicine).  Patients are able to view lab/test results, encounter notes, upcoming appointments, etc.  Non-urgent messages can be sent to your provider as well.   To learn more about what you can do with MyChart, go to [email or web address].    Your next appointment:   6 month(s)  Provider:   Jari Favre, PA-C   Other Instructions

## 2023-01-20 ENCOUNTER — Telehealth: Payer: Self-pay

## 2023-01-20 NOTE — Telephone Encounter (Signed)
 Copied from CRM [id number]. Topic: Clinical - Medication Question >> Jan 20, 2023 11:18 AM Bianca [name]:  Reason for CRM: The patient has a bad head cold and the patients daughter has requested the provider to let her know what medications the patient can take that will not affect her high blood pressure medications. Please follow up with the patients daughter [phone]

## 2023-01-21 NOTE — Telephone Encounter (Signed)
**Note De-identified  [place] Obfuscation** Please advise 

## 2023-01-21 NOTE — Telephone Encounter (Signed)
 Unable to reach patient's daughter also unable to lmtrc

## 2023-01-22 ENCOUNTER — Ambulatory Visit: Payer: Self-pay | Admitting: Internal Medicine

## 2023-01-22 ENCOUNTER — Ambulatory Visit
Admission: EM | Admit: 2023-01-22 | Discharge: 2023-01-22 | Disposition: A | Discharge status: Home / Self Care | Payer: 59 | Attending: Physician Assistant | Admitting: Physician Assistant

## 2023-01-22 ENCOUNTER — Ambulatory Visit (HOSPITAL_COMMUNITY)
Admission: EM | Admit: 2023-01-22 | Discharge: 2023-01-22 | Disposition: A | Discharge status: Other Acute Inpatient Hospital | Payer: 59

## 2023-01-22 DIAGNOSIS — J019 Acute sinusitis, unspecified: Secondary | ICD-10-CM | POA: Diagnosis not present

## 2023-01-22 LAB — POC RSV: RSV Antigen, POC: NEGATIVE

## 2023-01-22 MED ORDER — DOXYCYCLINE HYCLATE 100 MG PO CAPS: 100.0000 mg | ORAL_CAPSULE | Freq: Two times a day (BID) | ORAL | 0 refills | Status: AC

## 2023-01-22 NOTE — Telephone Encounter (Signed)
 Patient was taken to Urgent care

## 2023-01-22 NOTE — ED Triage Notes (Signed)
 Here with Daughter. This started Saturday with sore throat, progressing to Cough, Congestion, ? Fever and just tired now".

## 2023-01-22 NOTE — Telephone Encounter (Signed)
 Copied from CRM [id number]. Topic: Clinical - Pink Word Triage >> Jan 22, 2023  9:46 AM Isabell A wrote: Reason for Triage: Cold symptoms - patient feeling warm, no appetite, congestion, just feels bad over all.   Chief Complaint: Cough Symptoms: Cough, congestion, white/yellow tinged sputum production.  Pertinent Negatives: Patient denies shortness of breath  Disposition: [] ED /[] Urgent Care (no appt availability in office) / [x] Appointment(In office/virtual)/ []  [facility]/ [] Home Care/ [] Refused Recommended Disposition /[] [facility]/ []  Follow-up with PCP Additional Notes: Patient's daughter Santiago called due to the patient developing a cough and congestion 3-4 days ago. She states that the cough has been persistent and that it is productive of yellow/white sputum. She states that the patient has not had any difficulty breathing. She reports she is concerned about the patient developing pneumonia and wanted to know if the doctor could prescribe her medication for her symptoms. I advised the patient's daughter I would forward that request to the clinic and have set an appointment up for Friday for the patient.    Reason for Disposition  Cough with cold symptoms (e.g., runny nose, postnasal drip, throat clearing)  Answer Assessment - Initial Assessment Questions 1. ONSET: When did the cough begin?      3-4 days ago  2. SEVERITY: How bad is the cough today?      Mild to moderate  3. SPUTUM: Describe the color of your sputum (none, dry cough; clear, white, yellow, green)     White with a little yellow  4. HEMOPTYSIS: Are you coughing up any blood? If so ask: How much? (flecks, streaks, tablespoons, etc.)     No 5. DIFFICULTY BREATHING: Are you having difficulty breathing? If Yes, ask: How bad is it? (e.g., mild, moderate, severe)    - MILD: No SOB at rest, mild SOB with walking, speaks normally in sentences, can lie down, no retractions, pulse < 100.     - MODERATE: SOB at rest, SOB with minimal exertion and prefers to sit, cannot lie down flat, speaks in phrases, mild retractions, audible wheezing, pulse 100-120.    - SEVERE: Very SOB at rest, speaks in single words, struggling to breathe, sitting hunched forward, retractions, pulse > 120      No 6. FEVER: Do you have a fever? If Yes, ask: What is your temperature, how was it measured, and when did it start?     Possible fever, nothing measured  7. CARDIAC HISTORY: Do you have any history of heart disease? (e.g., heart attack, congestive heart failure)      Congestive heart failure, heart murmur 8. LUNG HISTORY: Do you have any history of lung disease?  (e.g., pulmonary embolus, asthma, emphysema)     No 9. PE RISK FACTORS: Do you have a history of blood clots? (or: recent major surgery, recent prolonged travel, bedridden)     No 10. OTHER SYMPTOMS: Do you have any other symptoms? (e.g., runny nose, wheezing, chest pain)       Decreased appetite, sinus congestion  11. PREGNANCY: Is there any chance you are pregnant? When was your last menstrual period?       No 12. TRAVEL: Have you traveled out of the country in the last month? (e.g., travel history, exposures)       No  Protocols used: Cough - Acute Productive-A-AH

## 2023-01-23 NOTE — ED Provider Notes (Signed)
 [facility]    CSN: [id number] Arrival date & time: 01/22/23  1206      History   Chief Complaint Chief Complaint  Patient presents with   Cough   Nasal Congestion   Fever   Fatigue    HPI Bianca [name] is a 89 y.o. female.   Patient here today with daughter for evaluation of cough, sore throat, congestion that started a few days ago.  She reports fatigue and questionable fever now.  She does not report any vomiting or diarrhea.  The history is provided by the patient and a relative.  Cough Associated symptoms: fever and sore throat   Associated symptoms: no chills, no ear pain, no eye discharge, no shortness of breath and no wheezing   Fever Associated symptoms: congestion, cough and sore throat   Associated symptoms: no chills, no diarrhea, no ear pain, no nausea and no vomiting     Past Medical History:  Diagnosis Date   Bradycardia    a. Holter monitor in the past showed bradycardia/pauses, so she is not on nodal blockers.    Chronic diastolic heart failure (HCC)    a. 12/2015 showed normal EF, mild LVH, ascending aortc aneurysm, severe LAE, mildly dilated RV with mildly reduced RVF, mild TR/PR, mildly increased PASP.   GERD (gastroesophageal reflux disease)    Hx of cardiovascular stress test    Lexiscan  Myoview  (10/14):  Fixed inf defect, no ischemia, study not gated   Hx of echocardiogram    Echo (11/14):  Mild LVH, EF 60-65%, mod to severe LAE, mod RVE, mild to mod reduced RVSF, mild RAE, PASP 38   Hypertension    Long term current use of anticoagulant    Mild pulmonary hypertension (HCC)    Obesity    Osteoarthritis    Permanent atrial fibrillation (HCC)    Refusal of blood transfusions as patient is Jehovah's Witness    Thoracic ascending aortic aneurysm [facility])     Patient Active Problem List   Diagnosis Date Noted   Bilateral leg edema 07/03/2022   Left kidney mass 02/15/2022   Encounter for general adult medical examination  with abnormal findings 01/01/2022   Seborrheic dermatitis 01/08/2019   Intertrigo 03/25/2018   Thoracic aortic aneurysm (HCC) 06/05/2016   Hypertensive heart disease 06/05/2016   Bilateral leg paresthesia 12/13/2014   At high risk for falls 05/11/2012   Long term (current) use of anticoagulants 08/15/2010   Osteoarthritis of both knees 10/28/2007   Obesity 06/04/2007   Atrial fibrillation (HCC) 12/08/2006   Chronic diastolic heart failure (HCC) 12/08/2006   HYPERTENSION, BENIGN ESSENTIAL 04/23/2006    Past Surgical History:  Procedure Laterality Date   ABDOMINAL HYSTERECTOMY     CARPAL TUNNEL RELEASE     CHOLECYSTECTOMY     IRRIGATION AND DEBRIDEMENT SEBACEOUS CYST  1980   sebaceous cysts removed from tail bone   KNEE ARTHROSCOPY Bilateral    TUBAL LIGATION      OB History   No obstetric history on file.      Home Medications    Prior to Admission medications   Medication Sig Start Date End Date Taking? Authorizing Provider  doxycycline  (VIBRAMYCIN ) 100 MG capsule Take 1 capsule (100 mg total) by mouth 2 (two) times daily for 7 days. 01/22/23 01/29/23 Yes Billy Asberry FALCON, PA-C  amLODipine -benazepril  (LOTREL) 5-10 MG capsule Take 1 capsule by mouth daily. 12/20/22   Lucien Orren SAILOR, PA-C  apixaban  (ELIQUIS ) 5 MG TABS tablet Take  1 tablet (5 mg total) by mouth 2 (two) times daily. 10/01/22   Dann Candyce RAMAN, MD  calcium-vitamin D  (OSCAL WITH D 500-200) 500-200 MG-UNIT per tablet Take 1 tablet by mouth daily.    [provider]  carboxymethylcellulose (REFRESH PLUS) 0.5 % SOLN Place 1 drop into both eyes daily as needed (dry eyes).    [provider]  Cholecalciferol (VITAMIN D -3) 1000 UNITS CAPS Take 1,000 Units by mouth daily.     [provider]  citalopram  (CELEXA ) 10 MG tablet Take 1 tablet (10 mg total) by mouth daily. 02/18/22 02/18/23  Norleen Lynwood ORN, MD  fish oil-omega-3 fatty acids 1000 MG capsule Take 2 g by mouth daily.    [provider]  furosemide  (LASIX ) 40 MG tablet Take 1.5 tablets (60 mg total) by mouth daily. Patient taking differently: Take 40 mg by mouth daily. 09/24/21   Dameron, Marisa, DO  gabapentin  (NEURONTIN ) 100 MG capsule Take 100 mg by mouth 3 (three) times daily. 12/04/21   [provider]  hydrocortisone  cream 1 % Apply 1 application topically 2 (two) times daily. Only on face 04/08/19   Erle Hails, DO  latanoprost (XALATAN) 0.005 % ophthalmic solution Place 1 drop into the left eye at bedtime. 06/15/21   [provider]  MAPAP ARTHRITIS PAIN 650 MG CR tablet TAKE 1 TABLET BY MOUTH  EVERY 8 HOURS AS NEEDED FOR PAIN 07/31/17   Yoo, Elsia J, DO  methocarbamol  (ROBAXIN ) 500 MG tablet Take 1 tablet (500 mg total) by mouth every 8 (eight) hours as needed for muscle spasms. 10/25/21   Patt Alm Macho, MD  Multiple Vitamins-Minerals (MULTI FOR HER 50+) TABS Take 1 tablet by mouth daily.    [provider]  nitroGLYCERIN  (NITROSTAT ) 0.4 MG SL tablet DISSOLVE 1 TABLET UNDER THE TONGUE EVERY 5 MINUTES AS  NEEDED FOR CHEST PAIN. MAX  OF 3 TABLETS IN 15 MINUTES. CALL 911 IF PAIN PERSISTS. 05/16/21   Dann Candyce RAMAN, MD  nystatin -triamcinolone  (MYCOLOG II) cream Apply to under breasts and on abdomen twice daily 03/18/19   Patel, Poonamkumari J, MD  Specialty Vitamins Products ([facility] HAIR/SKIN/NAILS) TABS Take 1 tablet by mouth daily.    [provider]  spironolactone  (ALDACTONE ) 25 MG tablet Take 0.5 tablets (12.5 mg total) by mouth daily. 10/01/22   Dann Candyce RAMAN, MD    Family History Family History  Problem Relation Age of Onset   Asthma Mother    Colon cancer Mother    Cancer Mother    Diabetes Sister    Diabetes Brother    Depression Daughter    Cancer Daughter    Breast cancer Daughter 42   Depression Daughter    Clotting disorder Daughter    Depression Daughter    Kidney cancer Son    Cancer Son    Breast cancer Maternal Grandmother    Diabetes  Other        sibling   Hypertension Other        sibling   Heart attack Neg Hx     Social History Social History   Tobacco Use   Smoking status: Former    Current packs/day: 0.00    Average packs/day: 0.5 packs/day for 10.0 years (5.0 ttl pk-yrs)    Types: Cigarettes    Start date: 08/14/1958    Quit date: 08/13/1968    Years since quitting: 54.4    Passive exposure: Past   Smokeless tobacco: Never  Vaping Use  Vaping status: Never Used  Substance Use Topics   Alcohol use: No   Drug use: No     Allergies   Hydrocodone, Morphine, and Penicillins   Review of Systems Review of Systems  Constitutional:  Positive for fatigue and fever. Negative for chills.  HENT:  Positive for congestion, sinus pressure and sore throat. Negative for ear pain.   Eyes:  Negative for discharge and redness.  Respiratory:  Positive for cough. Negative for shortness of breath and wheezing.   Gastrointestinal:  Negative for abdominal pain, diarrhea, nausea and vomiting.     Physical Exam Triage Vital Signs ED Triage Vitals  Encounter Vitals Group     BP 01/22/23 1235 (!) 174/90     Systolic BP Percentile --      Diastolic BP Percentile --      Pulse Rate 01/22/23 1235 65     Resp 01/22/23 1235 20     Temp 01/22/23 1235 98.6 F (37 C)     Temp Source 01/22/23 1235 Oral     SpO2 01/22/23 1235 99 %     Weight 01/22/23 1236 165 lb 2 oz (74.9 kg)     Height 01/22/23 1236 5' 6 (1.676 m)     Head Circumference --      Peak Flow --      Pain Score 01/22/23 1236 0     Pain Loc --      Pain Education --      Exclude from Growth Chart --    No data found.  Updated Vital Signs BP (!) 172/82 (BP Location: Left Arm)   Pulse 65   Temp 98.6 F (37 C) (Oral)   Resp 20   Ht 5' 6 (1.676 m)   Wt 165 lb 2 oz (74.9 kg)   SpO2 97%   BMI 26.65 kg/m   Visual Acuity Right Eye Distance:   Left Eye Distance:   Bilateral Distance:    Right Eye Near:   Left Eye Near:    Bilateral Near:      Physical Exam Vitals and nursing note reviewed.  Constitutional:      General: She is not in acute distress.    Appearance: Normal appearance. She is not ill-appearing.  HENT:     Head: Normocephalic and atraumatic.     Right Ear: Tympanic membrane normal.     Left Ear: Tympanic membrane normal.     Nose: Congestion present.     Mouth/Throat:     Mouth: Mucous membranes are moist.     Pharynx: No oropharyngeal exudate or posterior oropharyngeal erythema.  Eyes:     Conjunctiva/sclera: Conjunctivae normal.  Cardiovascular:     Rate and Rhythm: Normal rate and regular rhythm.     Heart sounds: Normal heart sounds. No murmur heard. Pulmonary:     Effort: Pulmonary effort is normal. No respiratory distress.     Breath sounds: Normal breath sounds. No wheezing, rhonchi or rales.  Skin:    General: Skin is warm and dry.  Neurological:     Mental Status: She is alert.  Psychiatric:        Mood and Affect: Mood normal.        Thought Content: Thought content normal.      UC Treatments / Results  Labs (all labs ordered are listed, but only abnormal results are displayed) Labs Reviewed  POC RSV - Normal    EKG   Radiology No results found.  Procedures Procedures (including  critical care time)  Medications Ordered in UC Medications - No data to display  Initial Impression / Assessment and Plan / UC Course  I have reviewed the triage vital signs and the nursing notes.  Pertinent labs & imaging results that were available during my care of the patient were reviewed by me and considered in my medical decision making (see chart for details).    Will treat to cover sinusitis given duration of symptoms and sinus pressure reported by patient.  Doxycycline  prescribed and recommended follow-up if no gradual improvement with any further concerns.  Final Clinical Impressions(s) / UC Diagnoses   Final diagnoses:  Acute sinusitis, recurrence not specified, unspecified  location   Discharge Instructions   None    ED Prescriptions     Medication Sig Dispense Auth. Provider   doxycycline  (VIBRAMYCIN ) 100 MG capsule Take 1 capsule (100 mg total) by mouth 2 (two) times daily for 7 days. 14 capsule Billy Asberry FALCON, PA-C      PDMP not reviewed this encounter.   Billy Asberry FALCON, PA-C 01/23/23 1110

## 2023-01-24 ENCOUNTER — Ambulatory Visit: Payer: 59 | Admitting: Internal Medicine

## 2023-01-29 ENCOUNTER — Telehealth: Payer: Self-pay | Admitting: Internal Medicine

## 2023-01-29 NOTE — Telephone Encounter (Signed)
 Patients daughter states she was diagnosed with Sinusitis, was given Doxycycline . Has one day left, productive cough yellow mucus, very thick mucus. Is having nasal drainage, cough is continuous during the day. She would like OTC reccomendations, daughter states patient is "sick of going to the doctor, just wants to feel better".

## 2023-01-29 NOTE — Telephone Encounter (Signed)
Copied from CRM [id number]. Topic: Clinical - Medical Advice >> Jan 29, 2023 11:32 AM Herbert Seta B wrote: Reason for CRM: Patient daughter calling because patient was seen in urgent care last week for Sinus infection. Patient still fatigued and coughing a lot, antibiotics gone as of today, daughter would like advise on what OTC or RX meds to give. [phone]

## 2023-01-30 NOTE — Telephone Encounter (Signed)
Spoke with patients daughter, gave a verbal understanding

## 2023-02-03 ENCOUNTER — Telehealth: Payer: Self-pay | Admitting: Cardiovascular Disease

## 2023-02-03 NOTE — Telephone Encounter (Signed)
Tried calling patient's daughter (per DPR) about her mother's symptoms. Voicemail was not set up.

## 2023-02-03 NOTE — Telephone Encounter (Signed)
Pt daughter states pt is complaining that her "chest feels funny". She states pt has a cold and coughing for a few days. She didn't say it was painful and she denies any other symptoms. Please advise if pt should sch sooner appt. She is currently scheduled for 6 mon f/u in June.

## 2023-02-06 NOTE — Telephone Encounter (Signed)
Second attempt to reach patient's daughter was unsuccessful. No answer and voicemail not set up. Will re-attempt a third time.

## 2023-02-07 NOTE — Telephone Encounter (Signed)
Daughter returned call and states pt went to urgent care and also followed up with PCP. Got cough medication and antibiotics and feels improved. She still has persistent cough that's productive. Had chest discomfort after coughing, but no other symptoms. Encouraged her to have mom increase fluid intake and take tylenol to help with the chest discomfort. She will call us back if any other concerns.

## 2023-02-25 ENCOUNTER — Telehealth: Payer: Self-pay

## 2023-02-25 NOTE — Telephone Encounter (Signed)
Copied from CRM [id number]. Topic: Clinical - Medical Advice >> Feb 25, 2023  1:45 PM Isabell A wrote: Reason for CRM: Ivor Costa, NP with [facility] stated she seen the patient today and completed a PVD screening - wanted to make Dr.Jones aware that both of her legs were abnormal. Soni gave the patient a paper to bring in to her next appointment.   Callback number:[phone]

## 2023-02-27 NOTE — Telephone Encounter (Signed)
FYI

## 2023-03-19 ENCOUNTER — Other Ambulatory Visit: Payer: Self-pay | Admitting: Interventional Cardiology

## 2023-03-19 DIAGNOSIS — I4819 Other persistent atrial fibrillation: Secondary | ICD-10-CM

## 2023-03-19 DIAGNOSIS — R0602 Shortness of breath: Secondary | ICD-10-CM

## 2023-03-19 DIAGNOSIS — I5032 Chronic diastolic (congestive) heart failure: Secondary | ICD-10-CM

## 2023-03-19 DIAGNOSIS — I10 Essential (primary) hypertension: Secondary | ICD-10-CM

## 2023-03-19 NOTE — Telephone Encounter (Signed)
 Pt last saw Jari Favre, [place] on 12/20/22, last labs 08/16/22 Creat 0.53, age 89, weight 74.9kg, based on specified criteria pt is on appropriate dosage of Eliquis 5mg  BID for afib.  Will refill rx.

## 2023-05-06 ENCOUNTER — Other Ambulatory Visit (HOSPITAL_BASED_OUTPATIENT_CLINIC_OR_DEPARTMENT_OTHER): Payer: Self-pay | Admitting: Physician Assistant

## 2023-05-06 NOTE — Telephone Encounter (Signed)
 Copied from CRM [id number]. Topic: Clinical - Medication Refill >> May 06, 2023 11:32 AM Clydene Darner H wrote: Most Recent Primary Care Visit:  Provider: Arcadio Knuckles  Department: [facility]  Visit Type: OFFICE VISIT  Date: 07/03/2022  Medication: amLODipine -benazepril   Has the patient contacted their pharmacy? Yes (Agent: If no, request that the patient contact the pharmacy for the refill. If patient does not wish to contact the pharmacy document the reason why and proceed with request.) (Agent: If yes, when and what did the pharmacy advise?)  Is this the correct pharmacy for this prescription? Yes If no, delete pharmacy and type the correct one.  This is the patient's preferred pharmacy:   [facility], [place] - [place], [place] - [address] [place] [address] Phone: [phone] Fax: [phone]  Has the prescription been filled recently? No  Is the patient out of the medication? Yes  Has the patient been seen for an appointment in the last year OR does the patient have an upcoming appointment? Yes  Can we respond through MyChart? No  Agent: Please be advised that Rx refills may take up to 3 business days. We ask that you follow-up with your pharmacy.

## 2023-05-19 ENCOUNTER — Ambulatory Visit (INDEPENDENT_AMBULATORY_CARE_PROVIDER_SITE_OTHER): Payer: 59

## 2023-05-19 VITALS — Ht 65.5 in | Wt 154.0 lb

## 2023-05-19 DIAGNOSIS — Z0000 Encounter for general adult medical examination without abnormal findings: Secondary | ICD-10-CM

## 2023-05-19 NOTE — Patient Instructions (Addendum)
 Ms. Bedi , Thank you for taking time to come for your Medicare Wellness Visit. I appreciate your ongoing commitment to your health goals. Please review the following plan we discussed and let me know if I can assist you in the future.   Referrals/Orders/Follow-Ups/Clinician Recommendations: Aim for 30 minutes of exercise or brisk walking, 6-8 glasses of water, and 5 servings of fruits and vegetables each day. Educated and advised on getting the COVID vaccine at local pharmacy.  This is a list of the screening recommended for you and due dates:  Health Maintenance  Topic Date Due   COVID-19 Vaccine (7 - 2024-25 season) 09/15/2022   Flu Shot  08/15/2023   Medicare Annual Wellness Visit  05/18/2024   DTaP/Tdap/Td vaccine (5 - Td or Tdap) 03/25/2028   Pneumonia Vaccine  Completed   DEXA scan (bone density measurement)  Completed   Zoster (Shingles) Vaccine  Completed   HPV Vaccine  Aged Out   Meningitis B Vaccine  Aged Out    Advanced directives: (In Chart) A copy of your advanced directives are scanned into your chart should your provider ever need it.  Next Medicare Annual Wellness Visit scheduled for next year: Yes  Have you seen your provider in the last 6 months (3 months if uncontrolled diabetes)? No - [facility] scheduled an appt for 06/16/2023

## 2023-05-19 NOTE — Progress Notes (Addendum)
 Subjective:   Bianca [name] is a 89 y.o. who presents for a Medicare Wellness preventive visit.  Visit Complete: Virtual I connected with  Bianca [name] on 05/19/23 by a audio enabled telemedicine application and verified that I am speaking with the correct person using two identifiers.  Patient Location: Home  Provider Location: Office/Clinic  I discussed the limitations of evaluation and management by telemedicine. The patient expressed understanding and agreed to proceed.  Vital Signs: Because this visit was a virtual/telehealth visit, some criteria may be missing or patient reported. Any vitals not documented were not able to be obtained and vitals that have been documented are patient reported.  VideoDeclined- This patient declined Librarian, academic. Therefore the visit was completed with audio only.  Persons Participating in Visit: Patient assisted by Daughter, Truett Gab.  AWV Questionnaire: No: Patient Medicare AWV questionnaire was not completed prior to this visit.  Cardiac Risk Factors include: advanced age (>29men, >78 women);hypertension     Objective:    Today's Vitals   05/19/23 0842  Weight: 154 lb (69.9 kg)  Height: 5' 5.5" (1.664 m)   Body mass index is 25.24 kg/m.     05/19/2023    8:41 AM 08/16/2022   12:13 PM 05/15/2022    9:02 AM 11/10/2021   10:40 PM 10/24/2021    5:29 PM 10/05/2021    1:26 PM 03/06/2021    9:19 AM  Advanced Directives  Does Patient Have a Medical Advance Directive? Yes No Yes No No No Yes  Type of Estate agent of [place];Living will  Healthcare Power of [place];Living will    Healthcare Power of Attorney  Does patient want to make changes to medical advance directive?   No - Patient declined    No - Patient declined  Copy of Healthcare Power of Attorney in Chart? No - copy requested  Yes - validated most recent copy scanned in chart (See row information)    Yes -  validated most recent copy scanned in chart (See row information)  Would patient like information on creating a medical advance directive?  No - Guardian declined    No - Patient declined     Current Medications (verified) Outpatient Encounter Medications as of 05/19/2023  Medication Sig   amLODipine -benazepril  (LOTREL) 5-10 MG capsule Take 1 capsule by mouth daily.   apixaban  (ELIQUIS ) 5 MG TABS tablet TAKE 1 TABLET BY MOUTH TWICE  DAILY   calcium-vitamin D  (OSCAL WITH D 500-200) 500-200 MG-UNIT per tablet Take 1 tablet by mouth daily.   carboxymethylcellulose (REFRESH PLUS) 0.5 % SOLN Place 1 drop into both eyes daily as needed (dry eyes).   Cholecalciferol (VITAMIN D -3) 1000 UNITS CAPS Take 1,000 Units by mouth daily.    fish oil-omega-3 fatty acids 1000 MG capsule Take 2 g by mouth daily.   furosemide  (LASIX ) 40 MG tablet Take 1.5 tablets (60 mg total) by mouth daily. (Patient taking differently: Take 40 mg by mouth daily.)   hydrocortisone  cream 1 % Apply 1 application topically 2 (two) times daily. Only on face   latanoprost (XALATAN) 0.005 % ophthalmic solution Place 1 drop into the left eye at bedtime.   MAPAP ARTHRITIS PAIN 650 MG CR tablet TAKE 1 TABLET BY MOUTH  EVERY 8 HOURS AS NEEDED FOR PAIN   methocarbamol  (ROBAXIN ) 500 MG tablet Take 1 tablet (500 mg total) by mouth every 8 (eight) hours as needed for muscle spasms.   Multiple Vitamins-Minerals (MULTI FOR  HER 50+) TABS Take 1 tablet by mouth daily.   nitroGLYCERIN  (NITROSTAT ) 0.4 MG SL tablet DISSOLVE 1 TABLET UNDER THE TONGUE EVERY 5 MINUTES AS  NEEDED FOR CHEST PAIN. MAX  OF 3 TABLETS IN 15 MINUTES. CALL 911 IF PAIN PERSISTS.   nystatin -triamcinolone  (MYCOLOG II) cream Apply to under breasts and on abdomen twice daily   Specialty Vitamins Products ([facility] HAIR/SKIN/NAILS) TABS Take 1 tablet by mouth daily.   spironolactone  (ALDACTONE ) 25 MG tablet TAKE ONE-HALF TABLET BY MOUTH  DAILY   citalopram  (CELEXA ) 10 MG tablet Take 1  tablet (10 mg total) by mouth daily.   gabapentin  (NEURONTIN ) 100 MG capsule Take 100 mg by mouth 3 (three) times daily. (Patient not taking: Reported on 05/19/2023)   No facility-administered encounter medications on file as of 05/19/2023.    Allergies (verified) Hydrocodone, Morphine, and Penicillins   History: Past Medical History:  Diagnosis Date   Bradycardia    a. Holter monitor in the past showed bradycardia/pauses, so she is not on nodal blockers.    Chronic diastolic heart failure (HCC)    a. 12/2015 showed normal EF, mild LVH, ascending aortc aneurysm, severe LAE, mildly dilated RV with mildly reduced RVF, mild TR/PR, mildly increased PASP.   GERD (gastroesophageal reflux disease)    Hx of cardiovascular stress test    Lexiscan  Myoview  (10/14):  Fixed inf defect, no ischemia, study not gated   Hx of echocardiogram    Echo (11/14):  Mild LVH, EF 60-65%, mod to severe LAE, mod RVE, mild to mod reduced RVSF, mild RAE, PASP 38   Hypertension    Long term current use of anticoagulant    Mild pulmonary hypertension (HCC)    Obesity    Osteoarthritis    Permanent atrial fibrillation (HCC)    Refusal of blood transfusions as patient is Jehovah's Witness    Thoracic ascending aortic aneurysm (HCC)    Past Surgical History:  Procedure Laterality Date   ABDOMINAL HYSTERECTOMY     CARPAL TUNNEL RELEASE     CHOLECYSTECTOMY     IRRIGATION AND DEBRIDEMENT SEBACEOUS CYST  1980   sebaceous cysts removed from tail bone   KNEE ARTHROSCOPY Bilateral    TUBAL LIGATION     Family History  Problem Relation Age of Onset   Asthma Mother    Colon cancer Mother    Cancer Mother    Diabetes Sister    Diabetes Brother    Depression Daughter    Cancer Daughter    Breast cancer Daughter 78   Depression Daughter    Clotting disorder Daughter    Depression Daughter    Kidney cancer Son    Cancer Son    Breast cancer Maternal Grandmother    Diabetes Other        sibling    Hypertension Other        sibling   Heart attack Neg Hx    Social History   Socioeconomic History   Marital status: Widowed    Spouse name: Not on file   Number of children: 11   Years of education: 9   Highest education level: 9th grade  Occupational History   Occupation: Retired    Associate Professor: [employer]    Comment: Cafeteria  Tobacco Use   Smoking status: Former    Current packs/day: 0.00    Average packs/day: 0.5 packs/day for 10.0 years (5.0 ttl pk-yrs)    Types: Cigarettes    Start date: 08/14/1958  Quit date: 08/13/1968    Years since quitting: 54.8    Passive exposure: Past   Smokeless tobacco: Never  Vaping Use   Vaping status: Never Used  Substance and Sexual Activity   Alcohol use: Yes    Alcohol/week: 1.0 standard drink of alcohol    Types: 1 Glasses of wine per week    Comment: occ   Drug use: No   Sexual activity: Not Currently  Other Topics Concern   Not on file  Social History Narrative   [place] lives alone,    Her husband passed away in 06/01/87. She has 11 children. One of her sons passed away as an infant, and one of her daughters was killed by a boyfriend at 32 years of age. She reports that her remaining children have a lot of health problems and this makes her very sad.       She is a TEFL teacher witness.       Current Social History       Who lives at home: Patient lives alone in one level home    Transportation: Patient is driven by her niece and  granddaughter. Currently applying for medicare transportation.    Pets: None    Education / Work:  10 th grade/ Retired    Location manager / Fun: Family events, picnics, out to eat   Religious / Personal Beliefs: Jehovah's Witness                                                                                                       Social Drivers of Corporate investment banker Strain: Low Risk  (05/19/2023)   Overall Financial Resource Strain (CARDIA)    Difficulty of Paying Living Expenses: Not  hard at all  Food Insecurity: No Food Insecurity (05/19/2023)   Hunger Vital Sign    Worried About Running Out of Food in the Last Year: Never true    Ran Out of Food in the Last Year: Never true  Transportation Needs: No Transportation Needs (05/19/2023)   PRAPARE - Administrator, Civil Service (Medical): No    Lack of Transportation (Non-Medical): No  Physical Activity: Insufficiently Active (05/19/2023)   Exercise Vital Sign    Days of Exercise per Week: 5 days    Minutes of Exercise per Session: 10 min  Stress: No Stress Concern Present (05/19/2023)   [employer] of Occupational Health - Occupational Stress Questionnaire    Feeling of Stress : Not at all  Social Connections: Moderately Integrated (05/19/2023)   Social Connection and Isolation Panel [NHANES]    Frequency of Communication with Friends and Family: More than three times a week    Frequency of Social Gatherings with Friends and Family: Three times a week    Attends Religious Services: 1 to 4 times per year    Active Member of Clubs or Organizations: Yes    Attends Banker Meetings: 1 to 4 times per year    Marital Status: Widowed    Tobacco Counseling Counseling given: No    Clinical  Intake:  Pre-visit preparation completed: Yes  Pain : No/denies pain     BMI - recorded: 25.24 Nutritional Risks: None Diabetes: No  Lab Results  Component Value Date   HGBA1C 5.2 02/15/2022   HGBA1C 5.5 10/27/2019   HGBA1C 5.3 01/05/2019     How often do you need to have someone help you when you read instructions, pamphlets, or other written materials from your doctor or pharmacy?: 3 - Sometimes (Dtr helps sometimes)  Interpreter Needed?: No  Information entered by :: Kandy Orris, CMA   Activities of Daily Living     05/19/2023    8:45 AM  In your present state of health, do you have any difficulty performing the following activities:  Hearing? 0  Vision? 0  Difficulty concentrating or  making decisions? 0  Walking or climbing stairs? 1  Comment uses walker if need  Dressing or bathing? 0  Doing errands, shopping? 0  Preparing Food and eating ? N  Using the Toilet? N  In the past six months, have you accidently leaked urine? Y  Comment wears a depend  Do you have problems with loss of bowel control? N  Managing your Medications? N  Managing your Finances? N  Housekeeping or managing your Housekeeping? N    Patient Care Team: Arcadio Knuckles, MD as PCP - General (Internal Medicine) Amedeo Jupiter, MD (Ophthalmology) Dorothea Gata, DDS (General Practice) Nahser, Lela Purple, MD as Consulting Physician (Cardiology)  Indicate any recent Medical Services you may have received from other than [facility] providers in the past year (date may be approximate).     Assessment:   This is a routine wellness examination for [facility].  Hearing/Vision screen Hearing Screening - Comments:: Denies hearing difficulties   Vision Screening - Comments:: Wears rx glasses -Appt today w/Dr Lasandra Points    Goals Addressed               This Visit's Progress     Patient Stated (pt-stated)        Patient stated she wants to manage her arthritis bc affects her exercising.       Depression Screen     05/19/2023    8:53 AM 05/15/2022    9:01 AM 02/15/2022    2:30 PM 01/01/2022    9:59 AM 10/05/2021    1:27 PM 03/06/2021    9:16 AM 11/01/2020   10:42 AM  PHQ 2/9 Scores  PHQ - 2 Score 2 0 0 0 0 0 0  PHQ- 9 Score 5 4 0  8  9    Fall Risk     05/19/2023    8:47 AM 05/15/2022    8:55 AM 02/15/2022    2:31 PM 01/01/2022    9:59 AM 10/05/2021    1:27 PM  Fall Risk   Falls in the past year? 0 1 1 0 0  Number falls in past yr: 0 1 0 0   Injury with Fall? 0 1 0 0   Risk for fall due to : No Fall Risks;History of fall(s) History of fall(s);Impaired balance/gait;Orthopedic patient Impaired balance/gait No Fall Risks   Follow up Falls prevention discussed;Falls evaluation completed Education  provided;Falls prevention discussed Falls evaluation completed Falls evaluation completed     MEDICARE RISK AT HOME:  Medicare Risk at Home Any stairs in or around the home?: No If so, are there any without handrails?: No Home free of loose throw rugs in walkways, pet beds, electrical cords, etc?: Yes Adequate lighting  in your home to reduce risk of falls?: Yes Life alert?: Yes Use of a cane, walker or w/c?: Yes (walker & a cane) Grab bars in the bathroom?: Yes Shower chair or bench in shower?: Yes Elevated toilet seat or a handicapped toilet?: Yes  TIMED UP AND GO:  Was the test performed?  No  Cognitive Function: 6CIT completed    05/15/2022    9:04 AM  MMSE - Mini Mental State Exam  Not completed: Unable to complete        05/19/2023    8:51 AM 03/06/2021    9:23 AM  6CIT Screen  What Year? 0 points 0 points  What month? 0 points 0 points  What time? 0 points 0 points  Count back from 20 0 points 2 points  Months in reverse 4 points 4 points  Repeat phrase 0 points 4 points  Total Score 4 points 10 points    Immunizations Immunization History  Administered Date(s) Administered   Fluad Quad(high Dose 65+) 09/17/2018, 08/16/2019   Influenza Whole 10/08/2006, 10/28/2007, 10/24/2008   Influenza, High Dose Seasonal PF 10/15/2013, 09/26/2014, 10/14/2016   Influenza,inj,Quad PF,6+ Mos 10/06/2012, 09/11/2015   Influenza-Unspecified 09/14/2017, 10/06/2017, 09/04/2021   PFIZER(Purple Top)SARS-COV-2 Vaccination 02/10/2019, 03/03/2019, 09/24/2019, 05/04/2020, 10/18/2021   Pfizer Covid-19 Vaccine Bivalent Booster 7yrs & up 11/01/2020   Pneumococcal Conjugate-13 05/03/2015   Pneumococcal Polysaccharide-23 11/14/1997   Td 01/14/1994, 01/14/2005, 10/22/2007   Tdap 03/26/2018   Zoster Recombinant(Shingrix) 10/28/2016, 01/08/2017   Zoster, Live 10/06/2012    Screening Tests Health Maintenance  Topic Date Due   COVID-19 Vaccine (7 - 2024-25 season) 09/15/2022   INFLUENZA  VACCINE  08/15/2023   Medicare Annual Wellness (AWV)  05/18/2024   DTaP/Tdap/Td (5 - Td or Tdap) 03/25/2028   Pneumonia Vaccine 27+ Years old  Completed   DEXA SCAN  Completed   Zoster Vaccines- Shingrix  Completed   HPV VACCINES  Aged Out   Meningococcal B Vaccine  Aged Out    Health Maintenance  Health Maintenance Due  Topic Date Due   COVID-19 Vaccine (7 - 2024-25 season) 09/15/2022   Health Maintenance Items Addressed: 05/19/2023   Additional Screening:  Vision Screening: Recommended annual ophthalmology exams for early detection of glaucoma and other disorders of the eye.  Pt stated she has an appt today w/Dr Lasandra Points for her routine eye exam.  Dental Screening: Recommended annual dental exams for proper oral hygiene  Community Resource Referral / Chronic Care Management: CRR required this visit?  No   CCM required this visit?  No     Plan:     I have personally reviewed and noted the following in the patient's chart:   Medical and social history Use of alcohol, tobacco or illicit drugs  Current medications and supplements including opioid prescriptions. Patient is not currently taking opioid prescriptions. Functional ability and status Nutritional status Physical activity Advanced directives List of other physicians Hospitalizations, surgeries, and ER visits in previous 12 months Vitals Screenings to include cognitive, depression, and falls Referrals and appointments  In addition, I have reviewed and discussed with patient certain preventive protocols, quality metrics, and best practice recommendations. A written personalized care plan for preventive services as well as general preventive health recommendations were provided to patient.     Patria Bookbinder, CMA   05/19/2023   After Visit Summary: (MyChart) Due to this being a telephonic visit, the after visit summary with patients personalized plan was offered to patient via MyChart   Notes:  PCP Follow Up  Recommendations: scheduled Physical for 06/16/2023. Dtr says pt will be moving soon and will transfer to a new PCP .

## 2023-06-13 ENCOUNTER — Telehealth: Payer: Self-pay | Admitting: Internal Medicine

## 2023-06-13 NOTE — Telephone Encounter (Signed)
 Copied from CRM [id number]. Topic: Clinical - Prescription Issue >> Jun 12, 2023  3:32 PM Albertha Alosa wrote: Reason for CRM: Patient daughter called in about prescription, stated the pharmacy has stated that Dr.Jones needs to renew all prescriptions and send them in to    [facility] - [place], [place] - [address] [place] [address] Phone: [phone] Fax: [phone] Hours: Not open 24 hours

## 2023-06-13 NOTE — Telephone Encounter (Signed)
 Patient daughter has been made aware that we refill her mothers medication Monday at her appointment.

## 2023-06-16 ENCOUNTER — Encounter: Payer: Self-pay | Admitting: Internal Medicine

## 2023-06-16 ENCOUNTER — Ambulatory Visit: Payer: Self-pay | Admitting: Internal Medicine

## 2023-06-16 ENCOUNTER — Ambulatory Visit (INDEPENDENT_AMBULATORY_CARE_PROVIDER_SITE_OTHER): Admitting: Internal Medicine

## 2023-06-16 VITALS — BP 148/88 | HR 88 | Temp 97.6°F | Ht 65.5 in | Wt 169.0 lb

## 2023-06-16 DIAGNOSIS — I5032 Chronic diastolic (congestive) heart failure: Secondary | ICD-10-CM | POA: Diagnosis not present

## 2023-06-16 DIAGNOSIS — E785 Hyperlipidemia, unspecified: Secondary | ICD-10-CM | POA: Diagnosis not present

## 2023-06-16 DIAGNOSIS — I4819 Other persistent atrial fibrillation: Secondary | ICD-10-CM | POA: Diagnosis not present

## 2023-06-16 DIAGNOSIS — Z0000 Encounter for general adult medical examination without abnormal findings: Secondary | ICD-10-CM | POA: Diagnosis not present

## 2023-06-16 DIAGNOSIS — I10 Essential (primary) hypertension: Secondary | ICD-10-CM

## 2023-06-16 DIAGNOSIS — I110 Hypertensive heart disease with heart failure: Secondary | ICD-10-CM | POA: Diagnosis not present

## 2023-06-16 DIAGNOSIS — Z0001 Encounter for general adult medical examination with abnormal findings: Secondary | ICD-10-CM

## 2023-06-16 LAB — CBC WITH DIFFERENTIAL/PLATELET
Basophils Absolute: 0 10*3/uL (ref 0.0–0.1)
Basophils Relative: 0.7 % (ref 0.0–3.0)
Eosinophils Absolute: 0 10*3/uL (ref 0.0–0.7)
Eosinophils Relative: 0.9 % (ref 0.0–5.0)
HCT: 35.9 % — ABNORMAL LOW (ref 36.0–46.0)
Hemoglobin: 11.7 g/dL — ABNORMAL LOW (ref 12.0–15.0)
Lymphocytes Relative: 43 % (ref 12.0–46.0)
Lymphs Abs: 1.5 10*3/uL (ref 0.7–4.0)
MCHC: 32.5 g/dL (ref 30.0–36.0)
MCV: 89.9 fl (ref 78.0–100.0)
Monocytes Absolute: 0.4 10*3/uL (ref 0.1–1.0)
Monocytes Relative: 9.9 % (ref 3.0–12.0)
Neutro Abs: 1.6 10*3/uL (ref 1.4–7.7)
Neutrophils Relative %: 45.5 % (ref 43.0–77.0)
Platelets: 183 10*3/uL (ref 150.0–400.0)
RBC: 4 Mil/uL (ref 3.87–5.11)
RDW: 14.2 % (ref 11.5–15.5)
WBC: 3.6 10*3/uL — ABNORMAL LOW (ref 4.0–10.5)

## 2023-06-16 LAB — BASIC METABOLIC PANEL WITH GFR
BUN: 17 mg/dL (ref 6–23)
CO2: 30 meq/L (ref 19–32)
Calcium: 9.4 mg/dL (ref 8.4–10.5)
Chloride: 105 meq/L (ref 96–112)
Creatinine, Ser: 0.6 mg/dL (ref 0.40–1.20)
GFR: 78.61 mL/min (ref >60.00)
Glucose, Bld: 95 mg/dL (ref 70–99)
Potassium: 4 meq/L (ref 3.5–5.1)
Sodium: 140 meq/L (ref 135–145)

## 2023-06-16 LAB — URINALYSIS, ROUTINE W REFLEX MICROSCOPIC
Hgb urine dipstick: NEGATIVE
Ketones, ur: NEGATIVE
Leukocytes,Ua: NEGATIVE
Nitrite: NEGATIVE
Specific Gravity, Urine: 1.015 (ref 1.000–1.030)
Urine Glucose: NEGATIVE
Urobilinogen, UA: 1 (ref 0.0–1.0)
pH: 6 (ref 5.0–8.0)

## 2023-06-16 LAB — HEPATIC FUNCTION PANEL
ALT: 9 U/L (ref 0–35)
AST: 20 U/L (ref 0–37)
Albumin: 4 g/dL (ref 3.5–5.2)
Alkaline Phosphatase: 69 U/L (ref 39–117)
Bilirubin, Direct: 0.2 mg/dL (ref 0.0–0.3)
Total Bilirubin: 0.9 mg/dL (ref 0.2–1.2)
Total Protein: 6.7 g/dL (ref 6.0–8.3)

## 2023-06-16 LAB — LIPID PANEL
Cholesterol: 143 mg/dL (ref 0–200)
HDL: 56.4 mg/dL (ref >39.00)
LDL Cholesterol: 73 mg/dL (ref 0–99)
NonHDL: 86.64
Total CHOL/HDL Ratio: 3
Triglycerides: 67 mg/dL (ref 0.0–149.0)
VLDL: 13.4 mg/dL (ref 0.0–40.0)

## 2023-06-16 LAB — TSH: TSH: 1.85 u[IU]/mL (ref 0.35–5.50)

## 2023-06-16 MED ORDER — TORSEMIDE 20 MG PO TABS: 20.0000 mg | ORAL_TABLET | Freq: Every day | ORAL | 0 refills | Status: DC

## 2023-06-16 NOTE — Patient Instructions (Signed)
 Atrial Fibrillation Atrial fibrillation (AFib) is a type of irregular or rapid heartbeat (arrhythmia). In AFib, the top part of the heart (atria) beats in an irregular pattern. This makes the heart unable to pump blood normally and effectively. The goal of treatment is to prevent blood clots from forming, control your heart rate, or restore your heartbeat to a normal rhythm. If this condition is not treated, it can cause serious problems, such as a weakened heart muscle (cardiomyopathy) or a stroke. What are the causes? This condition is often caused by medical conditions that damage the heart's electrical system. These include: High blood pressure (hypertension). This is the most common cause. Certain heart problems or conditions, such as heart failure, coronary artery disease, heart valve problems, or heart surgery. Diabetes. Overactive thyroid (hyperthyroidism). Chronic kidney disease. Certain lung conditions, such as emphysema, pneumonia, or COPD. Obstructive sleep apnea. In some cases, the cause of this condition is not known. What increases the risk? This condition is more likely to develop in: Older adults. Athletes who do endurance exercise. People who have a family history of AFib. Males. People who are Caucasian. People who are obese. People who smoke or misuse alcohol. What are the signs or symptoms? Symptoms of this condition include: Fast or irregular heartbeats (palpitations). Discomfort or pain in your chest. Shortness of breath. Sudden light-headedness or weakness. Tiring easily during exercise or activity. Syncope (fainting). Sweating. In some cases, there are no symptoms. How is this diagnosed? Your health care provider may detect AFib when taking your pulse. If detected, this condition may be diagnosed with: An electrocardiogram (ECG) to check electrical signals of the heart. An ambulatory cardiac monitor to record your heart's activity for a few days. A  transthoracic echocardiogram (TTE) to create pictures of your heart. A transesophageal echocardiogram (TEE) to create even clearer pictures of your heart. A stress test to check your blood supply while you exercise. Imaging tests, such as a CT scan or chest X-ray. Blood tests. How is this treated? Treatment depends on underlying conditions and how you feel when you get AFib. This condition may be treated with: Medicines to prevent blood clots or to treat heart rate or heart rhythm problems. Electrical cardioversion to reset the heart's rhythm. A pacemaker to correct abnormal heart rhythm. Ablation to remove the heart tissue that sends abnormal signals. Left atrial appendage closure to seal the area where blood clots can form. In some cases, underlying conditions will be treated. Follow these instructions at home: Medicines Take over-the counter and prescription medicines only as told by your provider. Do not take any new medicines without talking to your provider. If you are taking blood thinners: Talk with your provider before taking aspirin or NSAIDs. These medicines can raise your risk of bleeding. Take your medicines as told. Take them at the same time each day. Do not do things that could hurt or bruise you. Be careful to avoid falls. Wear an alert bracelet or carry a card that says that you take blood thinners. Lifestyle Do not use any products that contain nicotine or tobacco. These products include cigarettes, chewing tobacco, and vaping devices, such as e-cigarettes. If you need help quitting, ask your provider. Eat heart-healthy foods. Talk with a food expert (dietitian) to make an eating plan that is right for you. Exercise regularly as told by your provider. Do not drink alcohol. Lose weight if you are overweight. General instructions If you have obstructive sleep apnea, manage your condition as told by your provider.  Do not use diet pills unless your provider approves. Diet  pills can make heart problems worse. Keep all follow-up visits. Your provider will want to check your heart rate and rhythm regularly. Contact a health care provider if: You notice a change in the rate, rhythm, or strength of your heartbeat. You are taking a blood thinner and you notice more bruising. You tire more easily when you exercise or do heavy work. You have a sudden change in weight. Get help right away if:  You have chest pain. You have trouble breathing. You have side effects of blood thinners, such as blood in your vomit, poop (stool), or pee (urine), or bleeding that does not stop. You have any symptoms of a stroke. "BE FAST" is an easy way to remember the main warning signs of a stroke: B - Balance. Signs are dizziness, sudden trouble walking, or loss of balance. E - Eyes. Signs are trouble seeing or a sudden change in vision. F - Face. Signs are sudden weakness or numbness of the face, or the face or eyelid drooping on one side. A - Arms. Signs are weakness or numbness in an arm. This happens suddenly and usually on one side of the body. S - Speech.Signs are sudden trouble speaking, slurred speech, or trouble understanding what people say. T - Time. Time to call emergency services. Write down what time symptoms started. Other signs of a stroke, such as: A sudden, severe headache with no known cause. Nausea or vomiting. Seizure. These symptoms may be an emergency. Get help right away. Call 911. Do not wait to see if the symptoms will go away. Do not drive yourself to the hospital. This information is not intended to replace advice given to you by your health care provider. Make sure you discuss any questions you have with your health care provider. Document Revised: 09/19/2021 Document Reviewed: 09/19/2021 Elsevier Patient Education  2024 [employer].

## 2023-06-16 NOTE — Progress Notes (Unsigned)
 Subjective:  Patient ID: Bianca [name], female    DOB: 1930/02/10  Age: 89 y.o. MRN: [id number]  CC: Annual Exam (No concerns ), Hyperlipidemia, Hypertension, Atrial Fibrillation, and Congestive Heart Failure   HPI Bianca [name] presents for a CPX and f/up -----  Discussed the use of AI scribe software for clinical note transcription with the patient, who gave verbal consent to proceed.  History of Present Illness   Bianca [name] is a 89 year old female with hypertension who presents with leg weakness.  She has been experiencing bilateral knee pain for over a year, which she associates with receiving gel shots in her knees. Initially, these injections were effective for about five years, but their effectiveness has diminished recently. She has only received the shots twice recently and is uncertain if they have contributed to the worsening of her leg weakness.  No chest pain, shortness of breath, dizziness, lightheadedness, headaches, or blurred vision. She also reports no issues with nausea, vomiting, diarrhea, or stomach pain, but mentions occasional constipation.       Outpatient Medications Prior to Visit  Medication Sig Dispense Refill   amLODipine -benazepril  (LOTREL) 5-10 MG capsule Take 1 capsule by mouth daily. 90 capsule 3   apixaban  (ELIQUIS ) 5 MG TABS tablet TAKE 1 TABLET BY MOUTH TWICE  DAILY 200 tablet 1   calcium-vitamin D  (OSCAL WITH D 500-200) 500-200 MG-UNIT per tablet Take 1 tablet by mouth daily.     carboxymethylcellulose (REFRESH PLUS) 0.5 % SOLN Place 1 drop into both eyes daily as needed (dry eyes).     Cholecalciferol (VITAMIN D -3) 1000 UNITS CAPS Take 1,000 Units by mouth daily.      fish oil-omega-3 fatty acids 1000 MG capsule Take 2 g by mouth daily.     latanoprost (XALATAN) 0.005 % ophthalmic solution Place 1 drop into both eyes at bedtime.     Multiple Vitamins-Minerals (MULTI FOR HER 50+) TABS Take 1 tablet by mouth daily.      nitroGLYCERIN  (NITROSTAT ) 0.4 MG SL tablet DISSOLVE 1 TABLET UNDER THE TONGUE EVERY 5 MINUTES AS  NEEDED FOR CHEST PAIN. MAX  OF 3 TABLETS IN 15 MINUTES. CALL 911 IF PAIN PERSISTS. 25 tablet 3   Specialty Vitamins Products ([employer] HAIR/SKIN/NAILS) TABS Take 1 tablet by mouth daily.     spironolactone  (ALDACTONE ) 25 MG tablet TAKE ONE-HALF TABLET BY MOUTH  DAILY 45 tablet 2   furosemide  (LASIX ) 40 MG tablet Take 1.5 tablets (60 mg total) by mouth daily. (Patient taking differently: Take 40 mg by mouth daily.) 135 tablet 3   gabapentin  (NEURONTIN ) 100 MG capsule Take 100 mg by mouth 3 (three) times daily.     hydrocortisone  cream 1 % Apply 1 application topically 2 (two) times daily. Only on face 30 g 0   MAPAP ARTHRITIS PAIN 650 MG CR tablet TAKE 1 TABLET BY MOUTH  EVERY 8 HOURS AS NEEDED FOR PAIN 120 tablet 0   citalopram  (CELEXA ) 10 MG tablet Take 1 tablet (10 mg total) by mouth daily. 90 tablet 3   methocarbamol  (ROBAXIN ) 500 MG tablet Take 1 tablet (500 mg total) by mouth every 8 (eight) hours as needed for muscle spasms. 10 tablet 0   nystatin -triamcinolone  (MYCOLOG II) cream Apply to under breasts and on abdomen twice daily 30 g 0   No facility-administered medications prior to visit.    ROS Review of Systems  Musculoskeletal:  Positive for arthralgias.  Hematological:  Negative for adenopathy. Does not bruise/bleed easily.  Psychiatric/Behavioral:  Positive for confusion and decreased concentration.     Objective:  BP (!) 148/88 (BP Location: Left Arm, Patient Position: Sitting, Cuff Size: Normal)   Pulse 88   Temp 97.6 F (36.4 C) (Oral)   Ht 5' 5.5" (1.664 m)   Wt 169 lb (76.7 kg)   SpO2 99%   BMI 27.70 kg/m   BP Readings from Last 3 Encounters:  06/16/23 (!) 148/88  01/22/23 (!) 172/82  12/20/22 120/88    Wt Readings from Last 3 Encounters:  06/16/23 169 lb (76.7 kg)  05/19/23 154 lb (69.9 kg)  01/22/23 165 lb 2 oz (74.9 kg)    Physical Exam Cardiovascular:      Rate and Rhythm: Normal rate. Rhythm irregularly irregular.     Heart sounds: Normal heart sounds, S1 normal and S2 normal.     Comments: EKG- A fib, 66 bpm LAD RBBB - new LVH No Q waves Musculoskeletal:     Right lower leg: 1+ Edema present.     Left lower leg: 1+ Edema present.     Lab Results  Component Value Date   WBC 3.6 (L) 06/16/2023   HGB 11.7 (L) 06/16/2023   HCT 35.9 (L) 06/16/2023   PLT 183.0 06/16/2023   GLUCOSE 95 06/16/2023   CHOL 143 06/16/2023   TRIG 67.0 06/16/2023   HDL 56.40 06/16/2023   LDLDIRECT 119 (H) 06/14/2009   LDLCALC 73 06/16/2023   ALT 9 06/16/2023   AST 20 06/16/2023   NA 140 06/16/2023   K 4.0 06/16/2023   CL 105 06/16/2023   CREATININE 0.60 06/16/2023   BUN 17 06/16/2023   CO2 30 06/16/2023   TSH 1.85 06/16/2023   INR 2.8 (H) 01/19/2013   HGBA1C 5.2 02/15/2022    No results found.  Assessment & Plan:  HYPERTENSION, BENIGN ESSENTIAL -     EKG 12-Lead -     Basic metabolic panel with GFR; Future -     CBC with Differential/Platelet; Future -     TSH; Future -     Urinalysis, Routine w reflex microscopic; Future -     Hepatic function panel; Future -     Torsemide; Take 1 tablet (20 mg total) by mouth daily.  Dispense: 90 tablet; Refill: 0  Persistent atrial fibrillation (HCC) -     TSH; Future  Hypertensive heart disease with chronic diastolic congestive heart failure (HCC) -     Basic metabolic panel with GFR; Future -     TSH; Future -     Hepatic function panel; Future -     Torsemide; Take 1 tablet (20 mg total) by mouth daily.  Dispense: 90 tablet; Refill: 0  Chronic diastolic heart failure (HCC) -     Basic metabolic panel with GFR; Future -     Torsemide; Take 1 tablet (20 mg total) by mouth daily.  Dispense: 90 tablet; Refill: 0  Encounter for general adult medical examination with abnormal findings  Dyslipidemia, goal LDL below 130 -     Lipid panel; Future     Follow-up: Return in about 6 months (around  12/16/2023).  Sandra Crouch, MD

## 2023-06-30 ENCOUNTER — Encounter: Payer: Self-pay | Admitting: Cardiovascular Disease

## 2023-06-30 NOTE — Progress Notes (Unsigned)
  Cardiology Office Note   Date:  07/01/2023  ID:  [name], DOB 23-Dec-1930, MRN [id number] PCP: Arcadio Knuckles, MD  [facility] Providers Cardiologist:  None {  July 01, 2023  Bianca [name] is a 89 y.o. female , previous patient of Dr. Jacquelynn Matter  I am meeting her for the first time today  Hx of atrial fib, dilated ascending aorta meausring 47 mm, hx of HTN  Has been doing well recently  No CP  Occasional dyspnea     ROS  Studies Reviewed       Risk Assessment/Calculations  CHA2DS2-VASc Score = 4   This indicates a 4.8% annual risk of stroke. The patient's score is based upon: CHF History: 0 HTN History: 1 Diabetes History: 0 Stroke History: 0 Vascular Disease History: 0 Age Score: 2 Gender Score: 1         Physical Exam VS:  BP (!) 166/84   Pulse 75   Ht 5' 6 (1.676 m)   Wt 164 lb 12.8 oz (74.8 kg)   SpO2 100%   BMI 26.60 kg/m    Wt Readings from Last 3 Encounters:  07/01/23 164 lb 12.8 oz (74.8 kg)  06/16/23 169 lb (76.7 kg)  05/19/23 154 lb (69.9 kg)    GEN: elderly female  in no acute distress NECK: No JVD; No carotid bruits CARDIAC: irreg. Irreg.  RESPIRATORY:  Clear to auscultation without rales, wheezing or rhonchi  ABDOMEN: Soft, non-tender, non-distended EXTREMITIES:  1 + edema of right lower leg .    ASSESSMENT AND PLAN  Atrial fib :  cont eliquis  .  HR is well controlled.   2.  Moderate dilatation of her ascending aorta:  repeat CTA later this year .     3.  HTN:  BP is mildly elevated .   Her daughter says her BP is typically well controlled.  Will continue current meds.        Dispo: 1 year    Signed, Ahmad Alert, MD

## 2023-07-01 ENCOUNTER — Encounter: Payer: Self-pay | Admitting: Cardiovascular Disease

## 2023-07-01 ENCOUNTER — Ambulatory Visit: Payer: 59 | Attending: Cardiovascular Disease | Admitting: Cardiovascular Disease

## 2023-07-01 VITALS — BP 166/84 | HR 75 | Ht 66.0 in | Wt 164.8 lb

## 2023-07-01 DIAGNOSIS — I7121 Aneurysm of the ascending aorta, without rupture: Secondary | ICD-10-CM

## 2023-07-01 DIAGNOSIS — I4821 Permanent atrial fibrillation: Secondary | ICD-10-CM | POA: Diagnosis not present

## 2023-07-01 NOTE — Patient Instructions (Signed)
 Testing/Procedures: CTA of Aorta Non-Cardiac CT Angiography (CTA), is a special type of CT scan that uses a computer to produce multi-dimensional views of major blood vessels throughout the body. In CT angiography, a contrast material is injected through an IV to help visualize the blood vessels  Follow-Up: At [facility], you and your health needs are our priority.  As part of our continuing mission to provide you with exceptional heart care, our providers are all part of one team.  This team includes your primary Cardiologist (physician) and Advanced Practice Providers or APPs (Physician Assistants and Nurse Practitioners) who all work together to provide you with the care you need, when you need it.  Your next appointment:   1 year(s)  Provider:   Lise Ridge, MD

## 2023-07-14 ENCOUNTER — Telehealth: Payer: Self-pay | Admitting: Internal Medicine

## 2023-07-14 NOTE — Telephone Encounter (Signed)
 Copied from CRM [id number]. Topic: General - Other >> Jul 14, 2023  1:26 PM Chiquita SQUIBB wrote: Reason for CRM: Patient's son is calling in to see if we received the fax regarding the patients forms. Please contact Alm back to confirm or deny if we have received the fax. OK to leave a voicemail.

## 2023-07-14 NOTE — Telephone Encounter (Signed)
 Alm has been called and I did inform him the paperwork has been completed and faxed.

## 2023-07-16 ENCOUNTER — Telehealth: Payer: Self-pay

## 2023-07-16 ENCOUNTER — Other Ambulatory Visit: Payer: Self-pay

## 2023-07-16 DIAGNOSIS — R0789 Other chest pain: Secondary | ICD-10-CM

## 2023-07-16 DIAGNOSIS — R0602 Shortness of breath: Secondary | ICD-10-CM

## 2023-07-16 DIAGNOSIS — I110 Hypertensive heart disease with heart failure: Secondary | ICD-10-CM

## 2023-07-16 DIAGNOSIS — I10 Essential (primary) hypertension: Secondary | ICD-10-CM

## 2023-07-16 DIAGNOSIS — I4819 Other persistent atrial fibrillation: Secondary | ICD-10-CM

## 2023-07-16 DIAGNOSIS — I5032 Chronic diastolic (congestive) heart failure: Secondary | ICD-10-CM

## 2023-07-16 MED ORDER — APIXABAN 5 MG PO TABS: 5.0000 mg | ORAL_TABLET | Freq: Two times a day (BID) | ORAL | 0 refills | Status: DC

## 2023-07-16 MED ORDER — TORSEMIDE 20 MG PO TABS
20.0000 mg | ORAL_TABLET | Freq: Every day | ORAL | 0 refills | Status: DC
Start: 2023-07-16 — End: 2023-08-19

## 2023-07-16 MED ORDER — AMLODIPINE BESY-BENAZEPRIL HCL 5-10 MG PO CAPS: 1.0000 | ORAL_CAPSULE | Freq: Every day | ORAL | 0 refills | Status: DC

## 2023-07-16 MED ORDER — SPIRONOLACTONE 25 MG PO TABS
12.5000 mg | ORAL_TABLET | Freq: Every day | ORAL | 0 refills | Status: DC
Start: 2023-07-16 — End: 2023-08-15

## 2023-07-16 MED ORDER — NITROGLYCERIN 0.4 MG SL SUBL: SUBLINGUAL_TABLET | SUBLINGUAL | 0 refills | Status: AC

## 2023-07-16 NOTE — Telephone Encounter (Signed)
 Dr. Joshua is it okay for me to refill all of her medication as they have lost all of her medication.

## 2023-07-16 NOTE — Telephone Encounter (Signed)
Medication refills has been sent to Dr Yetta Barre

## 2023-07-16 NOTE — Telephone Encounter (Signed)
 Copied from CRM [id number]. Topic: Clinical - Prescription Issue >> Jul 16, 2023  9:04 AM Precious C wrote: Reason for CRM: Patient's daughter Santiago called stating the patient lost all of her medications during a recent move. She believes the medications fell off the back of the moving truck and confirmed the patient is currently out of all 12 medications she takes. She is requesting a call as soon as possible from the provider to discuss getting the medications replaced. Callback number: [phone]

## 2023-07-16 NOTE — Telephone Encounter (Signed)
 Yes, we can refill

## 2023-07-25 ENCOUNTER — Other Ambulatory Visit: Payer: Self-pay | Admitting: Internal Medicine

## 2023-07-25 DIAGNOSIS — R0789 Other chest pain: Secondary | ICD-10-CM

## 2023-07-28 ENCOUNTER — Ambulatory Visit (HOSPITAL_COMMUNITY)
Admission: RE | Admit: 2023-07-28 | Discharge: 2023-07-28 | Disposition: A | Discharge status: Home / Self Care | Source: Ambulatory Visit | Attending: Cardiovascular Disease | Admitting: Cardiovascular Disease

## 2023-07-28 DIAGNOSIS — I517 Cardiomegaly: Secondary | ICD-10-CM | POA: Insufficient documentation

## 2023-07-28 DIAGNOSIS — I7121 Aneurysm of the ascending aorta, without rupture: Secondary | ICD-10-CM | POA: Insufficient documentation

## 2023-07-28 DIAGNOSIS — I700 Atherosclerosis of aorta: Secondary | ICD-10-CM | POA: Diagnosis not present

## 2023-07-28 DIAGNOSIS — J439 Emphysema, unspecified: Secondary | ICD-10-CM | POA: Insufficient documentation

## 2023-07-28 MED ORDER — IOHEXOL 350 MG/ML SOLN
100.0000 mL | Freq: Once | INTRAVENOUS | Status: AC | PRN
  Administered 2023-07-28: 100 mL via INTRAVENOUS

## 2023-08-11 ENCOUNTER — Other Ambulatory Visit: Payer: Self-pay | Admitting: Internal Medicine

## 2023-08-11 DIAGNOSIS — I4819 Other persistent atrial fibrillation: Secondary | ICD-10-CM

## 2023-08-11 DIAGNOSIS — I10 Essential (primary) hypertension: Secondary | ICD-10-CM

## 2023-08-11 DIAGNOSIS — I110 Hypertensive heart disease with heart failure: Secondary | ICD-10-CM

## 2023-08-11 DIAGNOSIS — I5032 Chronic diastolic (congestive) heart failure: Secondary | ICD-10-CM

## 2023-08-11 DIAGNOSIS — R0602 Shortness of breath: Secondary | ICD-10-CM

## 2023-08-14 ENCOUNTER — Telehealth: Payer: Self-pay

## 2023-08-14 NOTE — Telephone Encounter (Signed)
 Copied from CRM [id number]. Topic: General - Other >> Jul 14, 2023  1:26 PM Chiquita SQUIBB wrote: Reason for CRM: Patient's son is calling in to see if we received the fax regarding the patients forms. Please contact Alm back to confirm or deny if we have received the fax. OK to leave a voicemail. >> Aug 14, 2023  2:17 PM Emylou G wrote: [employer]m [employer].. Said to have them refax.. said we missed spots on the original forms.  They refaxed back in June - But will resend back.

## 2023-08-15 NOTE — Telephone Encounter (Signed)
 Unable to reach david.  LMTRC

## 2023-08-16 ENCOUNTER — Ambulatory Visit: Payer: Self-pay | Admitting: Cardiology

## 2023-08-17 ENCOUNTER — Other Ambulatory Visit: Payer: Self-pay | Admitting: Internal Medicine

## 2023-08-17 DIAGNOSIS — I10 Essential (primary) hypertension: Secondary | ICD-10-CM

## 2023-08-17 DIAGNOSIS — I5032 Chronic diastolic (congestive) heart failure: Secondary | ICD-10-CM

## 2023-08-17 DIAGNOSIS — I110 Hypertensive heart disease with heart failure: Secondary | ICD-10-CM

## 2023-08-19 ENCOUNTER — Other Ambulatory Visit: Payer: Self-pay | Admitting: *Deleted

## 2023-08-19 DIAGNOSIS — I7121 Aneurysm of the ascending aorta, without rupture: Secondary | ICD-10-CM

## 2023-08-19 NOTE — Telephone Encounter (Signed)
 Last OV 06/16/23 Next OV 05/19/24  Last refill 07/16/23 Qty #90/0

## 2023-08-19 NOTE — Progress Notes (Signed)
cta

## 2023-08-22 NOTE — Telephone Encounter (Signed)
 Spoke with Alm and he asked for us  to hold off on this so that he can get all the rest of the paperwork he needs from the agency. He states that he will call us  back when he gets it.

## 2023-09-03 ENCOUNTER — Telehealth: Payer: Self-pay

## 2023-09-03 NOTE — Telephone Encounter (Signed)
 Copied from CRM #[id number]. Topic: General - Other >> Sep 02, 2023  4:30 PM Aisha D wrote: Reason for CRM: Pt's son Nicholaus stated that he will be faxing over paperwork that needs to be signed.

## 2023-09-04 NOTE — Telephone Encounter (Signed)
 Paperwork has been received and given to Dr. Joshua

## 2023-09-05 ENCOUNTER — Other Ambulatory Visit: Payer: Self-pay | Admitting: Internal Medicine

## 2023-09-09 NOTE — Telephone Encounter (Signed)
 Copied from CRM #[id number]. Topic: General - Other >> Sep 08, 2023  4:45 PM Chiquita SQUIBB wrote: Reason for CRM: Patient's son Pollie Poma, is calling in regarding the paperwork he had Jaz hold onto until the paper work has came through, patient is asking for [facility] to call him back at [phone] regarding that.

## 2023-09-10 ENCOUNTER — Telehealth: Payer: Self-pay

## 2023-09-10 NOTE — Telephone Encounter (Signed)
 Unable to reach Bianca [name].  LMTRC

## 2023-09-10 NOTE — Telephone Encounter (Signed)
 Copied from CRM #[id number]. Topic: General - Other >> Sep 10, 2023  1:33 PM Turkey A wrote: Reason for CRM: Patients son was returning call from [facility]. Please return call

## 2023-09-18 NOTE — Telephone Encounter (Signed)
 Spoke with the patients son and advised him that the paperwork was completed and faxed back to the CAP program. He gave a verbal understanding. Also I refaxed the paper today just for security.

## 2023-09-18 NOTE — Telephone Encounter (Signed)
 Spoke with patients son today. Everything has been faxed and patients son gave a verbal understanding.

## 2023-09-30 ENCOUNTER — Telehealth: Payer: Self-pay | Admitting: Internal Medicine

## 2023-09-30 NOTE — Telephone Encounter (Signed)
 Spoke with the patients son and he states that his mother was denied and there isn't nothing he can do. PCP would have to reach out to [employer] and see if there is something we can do to reverse the denial letter.  Patients son states that he is going to reach out to cap and then let us  know what we need to do.

## 2023-09-30 NOTE — Telephone Encounter (Signed)
 Copied from CRM [id number]. Topic: General - Call Back - No Documentation >> Sep 30, 2023  9:45 AM Rea BROCKS wrote: Reason for CRM: Paperwork:  Patient's son is calling in again about the [employer] Program documentation. He stated they received a letter that it was denied due to information provided from the provider. Patient's son is asking for what can be done to have the denial waived or to be considered back for consideration with the letter. Patient's son is not sure if the denial is due to the two different forms they were working on, or time frames of paperwork submission. Patient's son would appreciate a call back for next steps and how they can waive the denial.   [phone] (M)

## 2023-10-01 ENCOUNTER — Telehealth: Payer: Self-pay

## 2023-10-01 NOTE — Telephone Encounter (Signed)
 Copied from CRM #[id number]. Topic: General - Other >> Oct 01, 2023 10:55 AM Berneda FALCON wrote: Reason for CRM: Son, Alm, would like the nurse to call him back today about the CAP program they were speaking about yesterday please. He has some additional information he would like to give to her and ask her as well.  [phone] (M)

## 2023-10-02 NOTE — Telephone Encounter (Signed)
 CAP form has been fixed and I have refaxed it. Patients son has been made aware.

## 2023-10-27 ENCOUNTER — Telehealth: Payer: Self-pay

## 2023-10-27 NOTE — Telephone Encounter (Signed)
 Copied from CRM [id number]. Topic: Appointments - Scheduling Inquiry for Clinic >> Oct 27, 2023  9:57 AM Jasmin G wrote: Reason for CRM: Pt's daughter called to inquire about the possibility of getting a hearing test done for pt. Please call back at [phone] to discuss.

## 2023-10-29 NOTE — Telephone Encounter (Signed)
 Unable to reach patients Daughter [facility]

## 2023-10-31 NOTE — Telephone Encounter (Signed)
 Can we place a referral ENT

## 2023-11-03 ENCOUNTER — Other Ambulatory Visit: Payer: Self-pay | Admitting: Internal Medicine

## 2023-11-03 DIAGNOSIS — Z1231 Encounter for screening mammogram for malignant neoplasm of breast: Secondary | ICD-10-CM

## 2023-11-04 ENCOUNTER — Other Ambulatory Visit: Payer: Self-pay | Admitting: Physician Assistant

## 2023-11-04 DIAGNOSIS — R0602 Shortness of breath: Secondary | ICD-10-CM

## 2023-11-04 DIAGNOSIS — I4819 Other persistent atrial fibrillation: Secondary | ICD-10-CM

## 2023-11-04 DIAGNOSIS — I5032 Chronic diastolic (congestive) heart failure: Secondary | ICD-10-CM

## 2023-11-04 DIAGNOSIS — I10 Essential (primary) hypertension: Secondary | ICD-10-CM

## 2023-12-02 ENCOUNTER — Ambulatory Visit
Admission: RE | Admit: 2023-12-02 | Discharge: 2023-12-02 | Disposition: A | Discharge status: Home / Self Care | Source: Ambulatory Visit | Attending: Internal Medicine | Admitting: Internal Medicine

## 2023-12-02 DIAGNOSIS — Z1231 Encounter for screening mammogram for malignant neoplasm of breast: Secondary | ICD-10-CM

## 2023-12-04 ENCOUNTER — Other Ambulatory Visit: Payer: Self-pay | Admitting: Internal Medicine

## 2023-12-04 DIAGNOSIS — R928 Other abnormal and inconclusive findings on diagnostic imaging of breast: Secondary | ICD-10-CM

## 2023-12-15 ENCOUNTER — Ambulatory Visit
Admission: RE | Admit: 2023-12-15 | Discharge: 2023-12-15 | Disposition: A | Source: Ambulatory Visit | Attending: Internal Medicine | Admitting: Internal Medicine

## 2023-12-15 ENCOUNTER — Ambulatory Visit

## 2023-12-15 DIAGNOSIS — R928 Other abnormal and inconclusive findings on diagnostic imaging of breast: Secondary | ICD-10-CM

## 2023-12-29 ENCOUNTER — Telehealth: Payer: Self-pay

## 2023-12-29 NOTE — Telephone Encounter (Signed)
 Copied from CRM #[id number]. Topic: General - Other >> Dec 29, 2023  3:55 PM Shardie S wrote: Reason for CRM: Patient's son, Cali Hope, requesting to have PCS form and Cap form emailed to [id number] Callback # [phone]

## 2023-12-30 NOTE — Telephone Encounter (Signed)
 Bianca [name] has been made aware that he can come in the office and pick it up. He gave a verbal understanding

## 2024-01-01 ENCOUNTER — Other Ambulatory Visit: Payer: Self-pay | Admitting: Internal Medicine

## 2024-01-01 DIAGNOSIS — I5032 Chronic diastolic (congestive) heart failure: Secondary | ICD-10-CM

## 2024-01-01 DIAGNOSIS — I4819 Other persistent atrial fibrillation: Secondary | ICD-10-CM

## 2024-01-01 DIAGNOSIS — I10 Essential (primary) hypertension: Secondary | ICD-10-CM

## 2024-01-01 DIAGNOSIS — R0602 Shortness of breath: Secondary | ICD-10-CM

## 2024-01-05 ENCOUNTER — Other Ambulatory Visit: Payer: Self-pay

## 2024-01-05 DIAGNOSIS — I5032 Chronic diastolic (congestive) heart failure: Secondary | ICD-10-CM

## 2024-01-05 DIAGNOSIS — I10 Essential (primary) hypertension: Secondary | ICD-10-CM

## 2024-01-05 DIAGNOSIS — R0602 Shortness of breath: Secondary | ICD-10-CM

## 2024-01-05 DIAGNOSIS — I4819 Other persistent atrial fibrillation: Secondary | ICD-10-CM

## 2024-01-06 MED ORDER — APIXABAN 5 MG PO TABS: 5.0000 mg | ORAL_TABLET | Freq: Two times a day (BID) | ORAL | 0 refills | Status: AC

## 2024-01-06 NOTE — Telephone Encounter (Signed)
 Pt last saw Dr Alveta 07/01/23, last labs 06/16/23 Creat 0.60, age 89, weight 74.8kg, based on specified criteria pt is on appropriate dosage of Eliquis  5mg  BID for afib.  Will refill rx.

## 2024-01-22 ENCOUNTER — Other Ambulatory Visit: Payer: Self-pay

## 2024-01-22 DIAGNOSIS — I5032 Chronic diastolic (congestive) heart failure: Secondary | ICD-10-CM

## 2024-01-22 DIAGNOSIS — I10 Essential (primary) hypertension: Secondary | ICD-10-CM

## 2024-01-22 DIAGNOSIS — I4819 Other persistent atrial fibrillation: Secondary | ICD-10-CM

## 2024-01-22 DIAGNOSIS — R0602 Shortness of breath: Secondary | ICD-10-CM

## 2024-01-27 ENCOUNTER — Other Ambulatory Visit: Payer: Self-pay | Admitting: Physician Assistant

## 2024-01-27 DIAGNOSIS — I5032 Chronic diastolic (congestive) heart failure: Secondary | ICD-10-CM

## 2024-01-27 DIAGNOSIS — I10 Essential (primary) hypertension: Secondary | ICD-10-CM

## 2024-01-27 DIAGNOSIS — I4819 Other persistent atrial fibrillation: Secondary | ICD-10-CM

## 2024-01-27 DIAGNOSIS — R0602 Shortness of breath: Secondary | ICD-10-CM

## 2024-01-28 MED ORDER — SPIRONOLACTONE 25 MG PO TABS: 12.5000 mg | ORAL_TABLET | Freq: Every day | ORAL | 1 refills | Status: AC

## 2024-02-04 NOTE — Telephone Encounter (Signed)
 Form brought back into office with changes that need made - form placed in Dr. Joshua box up front

## 2024-02-06 NOTE — Telephone Encounter (Signed)
 Form has been received. 7-10 business day turn around.

## 2024-02-18 NOTE — Telephone Encounter (Signed)
 Patient needs an appointment for us  to fill these forms out. I have spoke with Alm Delaine an appointment scheduled and will have Dr. Joshua sign the forms at the appointment.

## 2024-02-19 ENCOUNTER — Ambulatory Visit (HOSPITAL_COMMUNITY)

## 2024-03-04 ENCOUNTER — Ambulatory Visit: Admitting: Internal Medicine

## 2024-03-04 ENCOUNTER — Ambulatory Visit (HOSPITAL_COMMUNITY)

## 2024-05-19 ENCOUNTER — Ambulatory Visit
